# Patient Record
Sex: Female | Born: 1935 | Race: White | Hispanic: No | Smoking: Former smoker
Health system: Southern US, Community
[De-identification: ages and names within clinical notes are randomized; demographics above are authoritative.]

## PROBLEM LIST (undated history)

## (undated) DIAGNOSIS — C50912 Malignant neoplasm of unspecified site of left female breast: Secondary | ICD-10-CM

## (undated) DIAGNOSIS — F32A Depression, unspecified: Secondary | ICD-10-CM

## (undated) DIAGNOSIS — F329 Major depressive disorder, single episode, unspecified: Secondary | ICD-10-CM

## (undated) DIAGNOSIS — I428 Other cardiomyopathies: Secondary | ICD-10-CM

## (undated) DIAGNOSIS — I480 Paroxysmal atrial fibrillation: Secondary | ICD-10-CM

## (undated) DIAGNOSIS — J439 Emphysema, unspecified: Secondary | ICD-10-CM

## (undated) DIAGNOSIS — M8580 Other specified disorders of bone density and structure, unspecified site: Secondary | ICD-10-CM

## (undated) DIAGNOSIS — Z95810 Presence of automatic (implantable) cardiac defibrillator: Secondary | ICD-10-CM

## (undated) DIAGNOSIS — M1990 Unspecified osteoarthritis, unspecified site: Secondary | ICD-10-CM

## (undated) DIAGNOSIS — I509 Heart failure, unspecified: Secondary | ICD-10-CM

## (undated) DIAGNOSIS — I10 Essential (primary) hypertension: Secondary | ICD-10-CM

## (undated) DIAGNOSIS — J189 Pneumonia, unspecified organism: Secondary | ICD-10-CM

## (undated) DIAGNOSIS — I5020 Unspecified systolic (congestive) heart failure: Secondary | ICD-10-CM

## (undated) DIAGNOSIS — F419 Anxiety disorder, unspecified: Secondary | ICD-10-CM

## (undated) DIAGNOSIS — R296 Repeated falls: Secondary | ICD-10-CM

## (undated) DIAGNOSIS — R011 Cardiac murmur, unspecified: Secondary | ICD-10-CM

## (undated) DIAGNOSIS — E785 Hyperlipidemia, unspecified: Secondary | ICD-10-CM

## (undated) HISTORY — DX: Emphysema, unspecified: J43.9

## (undated) HISTORY — DX: Depression, unspecified: F32.A

## (undated) HISTORY — PX: FRACTURE SURGERY: SHX138

## (undated) HISTORY — PX: CATARACT EXTRACTION W/ INTRAOCULAR LENS  IMPLANT, BILATERAL: SHX1307

## (undated) HISTORY — DX: Other specified disorders of bone density and structure, unspecified site: M85.80

## (undated) HISTORY — DX: Essential (primary) hypertension: I10

## (undated) HISTORY — DX: Hyperlipidemia, unspecified: E78.5

## (undated) HISTORY — DX: Other cardiomyopathies: I42.8

## (undated) HISTORY — PX: MASTECTOMY: SHX3

## (undated) HISTORY — PX: BREAST BIOPSY: SHX20

## (undated) HISTORY — DX: Major depressive disorder, single episode, unspecified: F32.9

## (undated) HISTORY — DX: Heart failure, unspecified: I50.9

## (undated) HISTORY — PX: TUBAL LIGATION: SHX77

---
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
## 2002-11-25 ENCOUNTER — Encounter: Admission: RE | Admit: 2002-11-25 | Discharge: 2002-11-25 | Payer: Self-pay | Admitting: Family Medicine

## 2002-11-25 ENCOUNTER — Ambulatory Visit (HOSPITAL_COMMUNITY): Admission: RE | Admit: 2002-11-25 | Discharge: 2002-11-25 | Payer: Self-pay | Admitting: Family Medicine

## 2002-11-27 ENCOUNTER — Encounter (INDEPENDENT_AMBULATORY_CARE_PROVIDER_SITE_OTHER): Payer: Self-pay | Admitting: *Deleted

## 2002-11-27 ENCOUNTER — Encounter (INDEPENDENT_AMBULATORY_CARE_PROVIDER_SITE_OTHER): Payer: Self-pay | Admitting: Radiology

## 2002-11-27 ENCOUNTER — Other Ambulatory Visit: Admission: RE | Admit: 2002-11-27 | Discharge: 2002-11-27 | Payer: Self-pay | Admitting: Radiology

## 2002-11-27 ENCOUNTER — Encounter: Payer: Self-pay | Admitting: Family Medicine

## 2002-11-27 ENCOUNTER — Encounter: Admission: RE | Admit: 2002-11-27 | Discharge: 2002-11-27 | Payer: Self-pay | Admitting: Family Medicine

## 2002-11-28 ENCOUNTER — Encounter: Admission: RE | Admit: 2002-11-28 | Discharge: 2002-11-28 | Payer: Self-pay | Admitting: Sports Medicine

## 2002-11-28 ENCOUNTER — Encounter: Payer: Self-pay | Admitting: Sports Medicine

## 2002-12-03 ENCOUNTER — Ambulatory Visit (HOSPITAL_COMMUNITY): Admission: RE | Admit: 2002-12-03 | Discharge: 2002-12-03 | Payer: Self-pay | Admitting: General Surgery

## 2002-12-03 ENCOUNTER — Encounter: Payer: Self-pay | Admitting: General Surgery

## 2002-12-18 HISTORY — PX: CARDIAC CATHETERIZATION: SHX172

## 2002-12-23 ENCOUNTER — Encounter: Admission: RE | Admit: 2002-12-23 | Discharge: 2002-12-23 | Payer: Self-pay | Admitting: Family Medicine

## 2002-12-30 ENCOUNTER — Inpatient Hospital Stay (HOSPITAL_COMMUNITY): Admission: RE | Admit: 2002-12-30 | Discharge: 2002-12-31 | Payer: Self-pay | Admitting: General Surgery

## 2002-12-30 ENCOUNTER — Encounter (INDEPENDENT_AMBULATORY_CARE_PROVIDER_SITE_OTHER): Payer: Self-pay

## 2003-01-16 ENCOUNTER — Inpatient Hospital Stay (HOSPITAL_COMMUNITY): Admission: RE | Admit: 2003-01-16 | Discharge: 2003-01-20 | Payer: Self-pay | Admitting: General Surgery

## 2003-02-11 ENCOUNTER — Ambulatory Visit (HOSPITAL_COMMUNITY): Admission: RE | Admit: 2003-02-11 | Discharge: 2003-02-11 | Payer: Self-pay | Admitting: Oncology

## 2003-02-11 ENCOUNTER — Encounter: Payer: Self-pay | Admitting: Oncology

## 2003-02-12 ENCOUNTER — Ambulatory Visit (HOSPITAL_COMMUNITY): Admission: RE | Admit: 2003-02-12 | Discharge: 2003-02-12 | Payer: Self-pay | Admitting: General Surgery

## 2003-02-12 ENCOUNTER — Encounter: Payer: Self-pay | Admitting: General Surgery

## 2003-09-09 ENCOUNTER — Ambulatory Visit (HOSPITAL_COMMUNITY): Admission: RE | Admit: 2003-09-09 | Discharge: 2003-09-09 | Payer: Self-pay | Admitting: Oncology

## 2003-09-21 ENCOUNTER — Encounter: Admission: RE | Admit: 2003-09-21 | Discharge: 2003-09-21 | Payer: Self-pay | Admitting: Oncology

## 2003-11-30 ENCOUNTER — Encounter: Admission: RE | Admit: 2003-11-30 | Discharge: 2003-11-30 | Payer: Self-pay | Admitting: Oncology

## 2003-12-15 ENCOUNTER — Encounter: Admission: RE | Admit: 2003-12-15 | Discharge: 2003-12-15 | Payer: Self-pay | Admitting: Sports Medicine

## 2003-12-21 ENCOUNTER — Encounter: Admission: RE | Admit: 2003-12-21 | Discharge: 2003-12-21 | Payer: Self-pay | Admitting: Family Medicine

## 2004-03-09 ENCOUNTER — Ambulatory Visit (HOSPITAL_COMMUNITY): Admission: RE | Admit: 2004-03-09 | Discharge: 2004-03-09 | Payer: Self-pay | Admitting: Oncology

## 2004-04-11 ENCOUNTER — Emergency Department (HOSPITAL_COMMUNITY): Admission: EM | Admit: 2004-04-11 | Discharge: 2004-04-11 | Payer: Self-pay | Admitting: Emergency Medicine

## 2004-04-19 ENCOUNTER — Ambulatory Visit: Payer: Self-pay | Admitting: Family Medicine

## 2004-05-31 ENCOUNTER — Ambulatory Visit: Payer: Self-pay | Admitting: Oncology

## 2004-09-28 ENCOUNTER — Ambulatory Visit: Payer: Self-pay | Admitting: Oncology

## 2004-10-07 ENCOUNTER — Encounter: Admission: RE | Admit: 2004-10-07 | Discharge: 2004-10-07 | Payer: Self-pay | Admitting: *Deleted

## 2004-10-13 ENCOUNTER — Ambulatory Visit (HOSPITAL_COMMUNITY): Admission: RE | Admit: 2004-10-13 | Discharge: 2004-10-14 | Payer: Self-pay | Admitting: *Deleted

## 2004-10-13 HISTORY — PX: CARDIAC DEFIBRILLATOR PLACEMENT: SHX171

## 2004-11-07 ENCOUNTER — Ambulatory Visit (HOSPITAL_COMMUNITY): Admission: RE | Admit: 2004-11-07 | Discharge: 2004-11-07 | Payer: Self-pay | Admitting: Oncology

## 2004-11-09 ENCOUNTER — Encounter: Admission: RE | Admit: 2004-11-09 | Discharge: 2004-11-09 | Payer: Self-pay | Admitting: Oncology

## 2004-11-30 ENCOUNTER — Encounter: Admission: RE | Admit: 2004-11-30 | Discharge: 2004-11-30 | Payer: Self-pay | Admitting: Oncology

## 2005-01-21 ENCOUNTER — Encounter (INDEPENDENT_AMBULATORY_CARE_PROVIDER_SITE_OTHER): Payer: Self-pay | Admitting: *Deleted

## 2005-01-23 ENCOUNTER — Ambulatory Visit: Payer: Self-pay | Admitting: Oncology

## 2005-02-07 ENCOUNTER — Ambulatory Visit: Payer: Self-pay | Admitting: Family Medicine

## 2005-02-07 ENCOUNTER — Encounter: Payer: Self-pay | Admitting: Family Medicine

## 2005-03-21 ENCOUNTER — Ambulatory Visit: Payer: Self-pay | Admitting: Sports Medicine

## 2005-05-19 ENCOUNTER — Ambulatory Visit: Payer: Self-pay | Admitting: Oncology

## 2005-07-08 ENCOUNTER — Emergency Department (HOSPITAL_COMMUNITY): Admission: EM | Admit: 2005-07-08 | Discharge: 2005-07-08 | Payer: Self-pay | Admitting: Emergency Medicine

## 2005-07-18 ENCOUNTER — Emergency Department (HOSPITAL_COMMUNITY): Admission: EM | Admit: 2005-07-18 | Discharge: 2005-07-18 | Payer: Self-pay | Admitting: Family Medicine

## 2005-11-19 ENCOUNTER — Ambulatory Visit: Payer: Self-pay | Admitting: Oncology

## 2005-11-21 ENCOUNTER — Ambulatory Visit (HOSPITAL_COMMUNITY): Admission: RE | Admit: 2005-11-21 | Discharge: 2005-11-21 | Payer: Self-pay | Admitting: Oncology

## 2005-11-21 LAB — CBC WITH DIFFERENTIAL/PLATELET
Basophils Absolute: 0 10*3/uL (ref 0.0–0.1)
EOS%: 1.2 % (ref 0.0–7.0)
Eosinophils Absolute: 0.1 10*3/uL (ref 0.0–0.5)
HGB: 13.5 g/dL (ref 11.6–15.9)
MCH: 32.1 pg (ref 26.0–34.0)
NEUT#: 6.4 10*3/uL (ref 1.5–6.5)
RBC: 4.21 10*6/uL (ref 3.70–5.32)
RDW: 13.3 % (ref 11.3–14.5)
lymph#: 1.3 10*3/uL (ref 0.9–3.3)

## 2005-11-21 LAB — COMPREHENSIVE METABOLIC PANEL
ALT: 17 U/L (ref 0–40)
AST: 32 U/L (ref 0–37)
Alkaline Phosphatase: 65 U/L (ref 39–117)
Chloride: 101 mEq/L (ref 96–112)
Creatinine, Ser: 1 mg/dL (ref 0.4–1.2)
Total Bilirubin: 0.5 mg/dL (ref 0.3–1.2)

## 2005-12-01 ENCOUNTER — Encounter: Admission: RE | Admit: 2005-12-01 | Discharge: 2005-12-01 | Payer: Self-pay | Admitting: Oncology

## 2006-02-21 ENCOUNTER — Ambulatory Visit: Payer: Self-pay | Admitting: Family Medicine

## 2006-03-12 ENCOUNTER — Ambulatory Visit: Payer: Self-pay | Admitting: Family Medicine

## 2006-05-29 ENCOUNTER — Ambulatory Visit: Payer: Self-pay | Admitting: Oncology

## 2006-05-31 LAB — LACTATE DEHYDROGENASE: LDH: 193 U/L (ref 94–250)

## 2006-05-31 LAB — CBC WITH DIFFERENTIAL/PLATELET
Basophils Absolute: 0 10*3/uL (ref 0.0–0.1)
Eosinophils Absolute: 0.3 10*3/uL (ref 0.0–0.5)
HCT: 36.3 % (ref 34.8–46.6)
HGB: 12.6 g/dL (ref 11.6–15.9)
LYMPH%: 28.2 % (ref 14.0–48.0)
MONO#: 0.5 10*3/uL (ref 0.1–0.9)
NEUT#: 3.6 10*3/uL (ref 1.5–6.5)
NEUT%: 59.1 % (ref 39.6–76.8)
Platelets: 201 10*3/uL (ref 145–400)
WBC: 6.1 10*3/uL (ref 3.9–10.0)

## 2006-05-31 LAB — COMPREHENSIVE METABOLIC PANEL
CO2: 27 mEq/L (ref 19–32)
Creatinine, Ser: 1.12 mg/dL (ref 0.40–1.20)
Glucose, Bld: 116 mg/dL — ABNORMAL HIGH (ref 70–99)
Total Bilirubin: 0.4 mg/dL (ref 0.3–1.2)

## 2006-05-31 LAB — CANCER ANTIGEN 27.29: CA 27.29: 25 U/mL (ref 0–39)

## 2006-09-20 DIAGNOSIS — I10 Essential (primary) hypertension: Secondary | ICD-10-CM

## 2006-09-20 DIAGNOSIS — I5032 Chronic diastolic (congestive) heart failure: Secondary | ICD-10-CM

## 2006-09-20 DIAGNOSIS — I472 Ventricular tachycardia: Secondary | ICD-10-CM

## 2006-09-20 DIAGNOSIS — E7800 Pure hypercholesterolemia, unspecified: Secondary | ICD-10-CM

## 2006-09-20 DIAGNOSIS — L408 Other psoriasis: Secondary | ICD-10-CM

## 2006-09-20 DIAGNOSIS — F339 Major depressive disorder, recurrent, unspecified: Secondary | ICD-10-CM | POA: Insufficient documentation

## 2006-09-20 DIAGNOSIS — M810 Age-related osteoporosis without current pathological fracture: Secondary | ICD-10-CM | POA: Insufficient documentation

## 2006-09-20 DIAGNOSIS — F17200 Nicotine dependence, unspecified, uncomplicated: Secondary | ICD-10-CM

## 2006-09-21 ENCOUNTER — Encounter (INDEPENDENT_AMBULATORY_CARE_PROVIDER_SITE_OTHER): Payer: Self-pay | Admitting: *Deleted

## 2006-11-27 ENCOUNTER — Ambulatory Visit: Payer: Self-pay | Admitting: Oncology

## 2006-11-29 LAB — COMPREHENSIVE METABOLIC PANEL
Albumin: 4.4 g/dL (ref 3.5–5.2)
Alkaline Phosphatase: 72 U/L (ref 39–117)
BUN: 16 mg/dL (ref 6–23)
CO2: 23 mEq/L (ref 19–32)
Glucose, Bld: 102 mg/dL — ABNORMAL HIGH (ref 70–99)
Total Bilirubin: 0.3 mg/dL (ref 0.3–1.2)

## 2006-11-29 LAB — CBC WITH DIFFERENTIAL/PLATELET
Basophils Absolute: 0 10*3/uL (ref 0.0–0.1)
Eosinophils Absolute: 0.1 10*3/uL (ref 0.0–0.5)
HCT: 34.4 % — ABNORMAL LOW (ref 34.8–46.6)
HGB: 12.1 g/dL (ref 11.6–15.9)
LYMPH%: 26 % (ref 14.0–48.0)
MCV: 90.2 fL (ref 81.0–101.0)
MONO#: 0.4 10*3/uL (ref 0.1–0.9)
MONO%: 7.4 % (ref 0.0–13.0)
NEUT#: 3.8 10*3/uL (ref 1.5–6.5)
Platelets: 176 10*3/uL (ref 145–400)

## 2006-11-29 LAB — LACTATE DEHYDROGENASE: LDH: 190 U/L (ref 94–250)

## 2006-11-29 LAB — CANCER ANTIGEN 27.29: CA 27.29: 24 U/mL (ref 0–39)

## 2006-12-03 ENCOUNTER — Ambulatory Visit (HOSPITAL_COMMUNITY): Admission: RE | Admit: 2006-12-03 | Discharge: 2006-12-03 | Payer: Self-pay | Admitting: Oncology

## 2006-12-04 ENCOUNTER — Encounter: Admission: RE | Admit: 2006-12-04 | Discharge: 2006-12-04 | Payer: Self-pay | Admitting: Oncology

## 2006-12-06 ENCOUNTER — Encounter: Payer: Self-pay | Admitting: Family Medicine

## 2006-12-18 ENCOUNTER — Encounter: Admission: RE | Admit: 2006-12-18 | Discharge: 2006-12-18 | Payer: Self-pay | Admitting: Oncology

## 2007-01-14 ENCOUNTER — Encounter: Payer: Self-pay | Admitting: Family Medicine

## 2007-01-21 ENCOUNTER — Telehealth: Payer: Self-pay | Admitting: *Deleted

## 2007-01-22 ENCOUNTER — Encounter: Payer: Self-pay | Admitting: Family Medicine

## 2007-05-28 ENCOUNTER — Ambulatory Visit: Payer: Self-pay | Admitting: Oncology

## 2007-05-29 ENCOUNTER — Encounter: Payer: Self-pay | Admitting: Family Medicine

## 2007-05-30 LAB — CBC WITH DIFFERENTIAL/PLATELET
BASO%: 0.5 % (ref 0.0–2.0)
EOS%: 3.4 % (ref 0.0–7.0)
MCH: 32.9 pg (ref 26.0–34.0)
MCHC: 36.1 g/dL — ABNORMAL HIGH (ref 32.0–36.0)
MONO#: 0.4 10*3/uL (ref 0.1–0.9)
RDW: 12.8 % (ref 11.3–14.5)
WBC: 6.7 10*3/uL (ref 3.9–10.0)
lymph#: 1.6 10*3/uL (ref 0.9–3.3)

## 2007-05-30 LAB — COMPREHENSIVE METABOLIC PANEL
ALT: 11 U/L (ref 0–35)
AST: 22 U/L (ref 0–37)
Albumin: 4.1 g/dL (ref 3.5–5.2)
Calcium: 9.3 mg/dL (ref 8.4–10.5)
Chloride: 100 mEq/L (ref 96–112)
Potassium: 4.6 mEq/L (ref 3.5–5.3)
Sodium: 134 mEq/L — ABNORMAL LOW (ref 135–145)
Total Protein: 7 g/dL (ref 6.0–8.3)

## 2007-06-03 ENCOUNTER — Encounter: Payer: Self-pay | Admitting: Family Medicine

## 2007-06-03 LAB — CONVERTED CEMR LAB
MCHC: 35.6 g/dL
RDW: 11.7 %
TSH: 7.55 microintl units/mL

## 2007-06-06 ENCOUNTER — Encounter: Payer: Self-pay | Admitting: *Deleted

## 2007-06-07 ENCOUNTER — Ambulatory Visit: Payer: Self-pay | Admitting: Family Medicine

## 2007-06-07 ENCOUNTER — Telehealth: Payer: Self-pay | Admitting: *Deleted

## 2007-06-12 ENCOUNTER — Encounter (INDEPENDENT_AMBULATORY_CARE_PROVIDER_SITE_OTHER): Payer: Self-pay | Admitting: *Deleted

## 2007-06-12 ENCOUNTER — Ambulatory Visit: Payer: Self-pay | Admitting: Family Medicine

## 2007-06-13 LAB — CONVERTED CEMR LAB
BUN: 12 mg/dL (ref 6–23)
Calcium: 9.4 mg/dL (ref 8.4–10.5)
Creatinine, Ser: 0.92 mg/dL (ref 0.40–1.20)
Glucose, Bld: 95 mg/dL (ref 70–99)
Sodium: 138 meq/L (ref 135–145)
TSH: 9.012 microintl units/mL — ABNORMAL HIGH (ref 0.350–5.50)

## 2007-06-18 ENCOUNTER — Telehealth (INDEPENDENT_AMBULATORY_CARE_PROVIDER_SITE_OTHER): Payer: Self-pay | Admitting: *Deleted

## 2007-07-09 ENCOUNTER — Telehealth: Payer: Self-pay | Admitting: *Deleted

## 2007-07-10 ENCOUNTER — Ambulatory Visit: Payer: Self-pay | Admitting: Family Medicine

## 2007-07-10 DIAGNOSIS — F519 Sleep disorder not due to a substance or known physiological condition, unspecified: Secondary | ICD-10-CM | POA: Insufficient documentation

## 2007-07-29 ENCOUNTER — Ambulatory Visit: Payer: Self-pay | Admitting: Family Medicine

## 2007-08-07 ENCOUNTER — Encounter: Payer: Self-pay | Admitting: Family Medicine

## 2007-08-08 ENCOUNTER — Telehealth: Payer: Self-pay | Admitting: *Deleted

## 2007-08-12 ENCOUNTER — Ambulatory Visit: Payer: Self-pay | Admitting: Sports Medicine

## 2007-09-05 ENCOUNTER — Encounter: Payer: Self-pay | Admitting: *Deleted

## 2007-10-15 ENCOUNTER — Encounter: Payer: Self-pay | Admitting: Family Medicine

## 2007-11-05 ENCOUNTER — Ambulatory Visit: Payer: Self-pay | Admitting: Family Medicine

## 2007-11-05 ENCOUNTER — Telehealth: Payer: Self-pay | Admitting: Family Medicine

## 2007-11-05 ENCOUNTER — Encounter: Payer: Self-pay | Admitting: Family Medicine

## 2007-11-05 DIAGNOSIS — Z853 Personal history of malignant neoplasm of breast: Secondary | ICD-10-CM

## 2007-11-05 LAB — CONVERTED CEMR LAB
ALT: 13 units/L (ref 0–35)
AST: 24 units/L (ref 0–37)
CO2: 23 meq/L (ref 19–32)
Calcium: 9.5 mg/dL (ref 8.4–10.5)
Chloride: 103 meq/L (ref 96–112)
Cholesterol: 224 mg/dL — ABNORMAL HIGH (ref 0–200)
Free T4: 1.33 ng/dL (ref 0.89–1.80)
Platelets: 196 10*3/uL (ref 150–400)
RDW: 13.1 % (ref 11.5–15.5)
Sodium: 141 meq/L (ref 135–145)
T3, Free: 3.2 pg/mL (ref 2.3–4.2)
TSH: 7.07 microintl units/mL — ABNORMAL HIGH (ref 0.350–5.50)
Total Bilirubin: 0.5 mg/dL (ref 0.3–1.2)
Total Protein: 7.3 g/dL (ref 6.0–8.3)
VLDL: 17 mg/dL (ref 0–40)
WBC: 7.5 10*3/uL (ref 4.0–10.5)

## 2007-11-08 ENCOUNTER — Encounter: Payer: Self-pay | Admitting: Family Medicine

## 2007-11-12 ENCOUNTER — Encounter: Payer: Self-pay | Admitting: Family Medicine

## 2007-11-13 ENCOUNTER — Encounter: Payer: Self-pay | Admitting: Family Medicine

## 2007-12-03 ENCOUNTER — Ambulatory Visit: Payer: Self-pay | Admitting: Oncology

## 2007-12-05 ENCOUNTER — Encounter: Admission: RE | Admit: 2007-12-05 | Discharge: 2007-12-05 | Payer: Self-pay | Admitting: Oncology

## 2007-12-09 ENCOUNTER — Ambulatory Visit (HOSPITAL_COMMUNITY): Admission: RE | Admit: 2007-12-09 | Discharge: 2007-12-09 | Payer: Self-pay | Admitting: Oncology

## 2007-12-13 ENCOUNTER — Encounter: Payer: Self-pay | Admitting: Family Medicine

## 2008-01-27 ENCOUNTER — Encounter: Payer: Self-pay | Admitting: Family Medicine

## 2008-01-27 LAB — CONVERTED CEMR LAB
ALT: 12 units/L
AST: 36 units/L
BUN: 16 mg/dL
Calcium: 10 mg/dL
Direct LDL: 147 mg/dL
Glucose, Bld: 100 mg/dL
Hemoglobin: 13.7 g/dL
Lymphocytes Relative: 21.5 %
Lymphs Abs: 1.2 10*3/uL
MCHC: 35.7 g/dL
Monocytes Absolute: 0.4 10*3/uL
Monocytes Relative: 8 %
Neutro Abs: 3.8 10*3/uL
Neutrophils Relative %: 68.6 %
Potassium: 4.6 meq/L
RBC: 4.21 M/uL
T3, Free: 3.8 pg/mL
Total Bilirubin: 0.6 mg/dL
Total CHOL/HDL Ratio: 62
VLDL: 18 mg/dL
WBC: 5.5 10*3/uL

## 2008-01-30 ENCOUNTER — Encounter: Payer: Self-pay | Admitting: Family Medicine

## 2008-05-29 ENCOUNTER — Ambulatory Visit: Payer: Self-pay | Admitting: Oncology

## 2008-06-02 LAB — CBC WITH DIFFERENTIAL/PLATELET
BASO%: 0.3 % (ref 0.0–2.0)
EOS%: 2.2 % (ref 0.0–7.0)
HCT: 35.3 % (ref 34.8–46.6)
LYMPH%: 27.9 % (ref 14.0–48.0)
MCH: 32.9 pg (ref 26.0–34.0)
MCHC: 35.3 g/dL (ref 32.0–36.0)
NEUT%: 62.6 % (ref 39.6–76.8)
Platelets: 165 10*3/uL (ref 145–400)
RBC: 3.78 10*6/uL (ref 3.70–5.32)
lymph#: 1.7 10*3/uL (ref 0.9–3.3)

## 2008-06-03 LAB — COMPREHENSIVE METABOLIC PANEL
ALT: 15 U/L (ref 0–35)
AST: 26 U/L (ref 0–37)
Creatinine, Ser: 1.03 mg/dL (ref 0.40–1.20)
Sodium: 136 mEq/L (ref 135–145)
Total Bilirubin: 0.4 mg/dL (ref 0.3–1.2)
Total Protein: 7.6 g/dL (ref 6.0–8.3)

## 2008-06-09 ENCOUNTER — Encounter: Payer: Self-pay | Admitting: Family Medicine

## 2008-06-29 ENCOUNTER — Encounter: Payer: Self-pay | Admitting: Family Medicine

## 2008-07-29 ENCOUNTER — Encounter: Payer: Self-pay | Admitting: Family Medicine

## 2008-09-15 ENCOUNTER — Ambulatory Visit: Payer: Self-pay | Admitting: Family Medicine

## 2008-10-12 ENCOUNTER — Telehealth: Payer: Self-pay | Admitting: Family Medicine

## 2008-10-12 ENCOUNTER — Ambulatory Visit: Payer: Self-pay | Admitting: Family Medicine

## 2008-10-12 DIAGNOSIS — N8112 Cystocele, lateral: Secondary | ICD-10-CM

## 2008-10-14 ENCOUNTER — Ambulatory Visit: Payer: Self-pay | Admitting: Obstetrics & Gynecology

## 2008-11-17 ENCOUNTER — Ambulatory Visit: Payer: Self-pay | Admitting: Family Medicine

## 2008-12-07 ENCOUNTER — Encounter: Admission: RE | Admit: 2008-12-07 | Discharge: 2008-12-07 | Payer: Self-pay | Admitting: Oncology

## 2008-12-09 ENCOUNTER — Ambulatory Visit (HOSPITAL_COMMUNITY): Admission: RE | Admit: 2008-12-09 | Discharge: 2008-12-09 | Payer: Self-pay | Admitting: Oncology

## 2008-12-17 ENCOUNTER — Ambulatory Visit: Payer: Self-pay | Admitting: Oncology

## 2008-12-22 ENCOUNTER — Encounter: Admission: RE | Admit: 2008-12-22 | Discharge: 2008-12-22 | Payer: Self-pay | Admitting: Oncology

## 2008-12-22 LAB — CBC WITH DIFFERENTIAL/PLATELET
Eosinophils Absolute: 0.2 10*3/uL (ref 0.0–0.5)
HCT: 35.3 % (ref 34.8–46.6)
HGB: 12.3 g/dL (ref 11.6–15.9)
LYMPH%: 28.4 % (ref 14.0–49.7)
MONO#: 0.5 10*3/uL (ref 0.1–0.9)
NEUT#: 4 10*3/uL (ref 1.5–6.5)
Platelets: 199 10*3/uL (ref 145–400)
RBC: 3.84 10*6/uL (ref 3.70–5.45)
WBC: 6.6 10*3/uL (ref 3.9–10.3)

## 2008-12-23 LAB — CANCER ANTIGEN 27.29: CA 27.29: 30 U/mL (ref 0–39)

## 2008-12-23 LAB — COMPREHENSIVE METABOLIC PANEL
Albumin: 4.2 g/dL (ref 3.5–5.2)
CO2: 27 mEq/L (ref 19–32)
Glucose, Bld: 73 mg/dL (ref 70–99)
Potassium: 4.8 mEq/L (ref 3.5–5.3)
Sodium: 133 mEq/L — ABNORMAL LOW (ref 135–145)
Total Bilirubin: 0.4 mg/dL (ref 0.3–1.2)
Total Protein: 7.2 g/dL (ref 6.0–8.3)

## 2008-12-23 LAB — VITAMIN D 25 HYDROXY (VIT D DEFICIENCY, FRACTURES): Vit D, 25-Hydroxy: 59 ng/mL (ref 30–89)

## 2008-12-23 LAB — LACTATE DEHYDROGENASE: LDH: 212 U/L (ref 94–250)

## 2008-12-29 ENCOUNTER — Encounter: Payer: Self-pay | Admitting: Family Medicine

## 2009-02-15 ENCOUNTER — Encounter: Payer: Self-pay | Admitting: Family Medicine

## 2009-06-21 ENCOUNTER — Encounter: Payer: Self-pay | Admitting: Family Medicine

## 2009-06-25 ENCOUNTER — Ambulatory Visit: Payer: Self-pay | Admitting: Oncology

## 2009-06-29 LAB — CBC WITH DIFFERENTIAL/PLATELET
BASO%: 0.1 % (ref 0.0–2.0)
EOS%: 3.3 % (ref 0.0–7.0)
HCT: 34.8 % (ref 34.8–46.6)
LYMPH%: 24.8 % (ref 14.0–49.7)
MCH: 32.6 pg (ref 25.1–34.0)
MCHC: 34.5 g/dL (ref 31.5–36.0)
MCV: 94.2 fL (ref 79.5–101.0)
NEUT%: 65.7 % (ref 38.4–76.8)
Platelets: 221 10*3/uL (ref 145–400)

## 2009-06-30 LAB — LACTATE DEHYDROGENASE: LDH: 180 U/L (ref 94–250)

## 2009-06-30 LAB — COMPREHENSIVE METABOLIC PANEL
ALT: 11 U/L (ref 0–35)
AST: 21 U/L (ref 0–37)
BUN: 18 mg/dL (ref 6–23)
Creatinine, Ser: 1.09 mg/dL (ref 0.40–1.20)
Total Bilirubin: 0.3 mg/dL (ref 0.3–1.2)

## 2009-07-06 ENCOUNTER — Encounter: Payer: Self-pay | Admitting: Family Medicine

## 2009-08-06 ENCOUNTER — Ambulatory Visit: Payer: Self-pay | Admitting: Family Medicine

## 2009-08-26 ENCOUNTER — Ambulatory Visit: Payer: Self-pay | Admitting: Obstetrics & Gynecology

## 2009-09-09 ENCOUNTER — Ambulatory Visit: Payer: Self-pay | Admitting: Obstetrics & Gynecology

## 2009-10-11 ENCOUNTER — Encounter: Payer: Self-pay | Admitting: Family Medicine

## 2009-12-08 ENCOUNTER — Encounter: Admission: RE | Admit: 2009-12-08 | Discharge: 2009-12-08 | Payer: Self-pay | Admitting: Oncology

## 2009-12-28 ENCOUNTER — Ambulatory Visit: Payer: Self-pay | Admitting: Oncology

## 2009-12-30 LAB — CBC WITH DIFFERENTIAL/PLATELET
BASO%: 0.3 % (ref 0.0–2.0)
Eosinophils Absolute: 0.1 10*3/uL (ref 0.0–0.5)
HCT: 33.8 % — ABNORMAL LOW (ref 34.8–46.6)
HGB: 11.8 g/dL (ref 11.6–15.9)
LYMPH%: 28.2 % (ref 14.0–49.7)
MCHC: 35 g/dL (ref 31.5–36.0)
MONO#: 0.5 10*3/uL (ref 0.1–0.9)
NEUT#: 3.8 10*3/uL (ref 1.5–6.5)
NEUT%: 60.7 % (ref 38.4–76.8)
Platelets: 183 10*3/uL (ref 145–400)
WBC: 6.2 10*3/uL (ref 3.9–10.3)
lymph#: 1.7 10*3/uL (ref 0.9–3.3)

## 2009-12-30 LAB — COMPREHENSIVE METABOLIC PANEL
ALT: 10 U/L (ref 0–35)
CO2: 27 mEq/L (ref 19–32)
Calcium: 8.9 mg/dL (ref 8.4–10.5)
Chloride: 98 mEq/L (ref 96–112)
Creatinine, Ser: 1.12 mg/dL (ref 0.40–1.20)
Glucose, Bld: 106 mg/dL — ABNORMAL HIGH (ref 70–99)
Sodium: 134 mEq/L — ABNORMAL LOW (ref 135–145)
Total Bilirubin: 0.4 mg/dL (ref 0.3–1.2)
Total Protein: 6.7 g/dL (ref 6.0–8.3)

## 2009-12-30 LAB — CANCER ANTIGEN 27.29: CA 27.29: 32 U/mL (ref 0–39)

## 2009-12-30 LAB — LACTATE DEHYDROGENASE: LDH: 181 U/L (ref 94–250)

## 2010-01-25 ENCOUNTER — Encounter: Payer: Self-pay | Admitting: Family Medicine

## 2010-04-08 ENCOUNTER — Ambulatory Visit: Payer: Self-pay | Admitting: Family Medicine

## 2010-04-12 ENCOUNTER — Encounter: Payer: Self-pay | Admitting: Family Medicine

## 2010-05-05 ENCOUNTER — Encounter: Payer: Self-pay | Admitting: Family Medicine

## 2010-06-06 ENCOUNTER — Encounter: Payer: Self-pay | Admitting: Family Medicine

## 2010-06-15 ENCOUNTER — Ambulatory Visit: Payer: Self-pay | Admitting: Cardiology

## 2010-06-15 ENCOUNTER — Encounter: Payer: Self-pay | Admitting: Family Medicine

## 2010-06-15 ENCOUNTER — Inpatient Hospital Stay (HOSPITAL_COMMUNITY): Admission: AD | Admit: 2010-06-15 | Discharge: 2010-06-17 | Payer: Self-pay | Admitting: Cardiovascular Disease

## 2010-06-17 ENCOUNTER — Encounter: Payer: Self-pay | Admitting: Internal Medicine

## 2010-06-17 HISTORY — PX: PACEMAKER INSERTION: SHX728

## 2010-06-23 ENCOUNTER — Encounter: Payer: Self-pay | Admitting: Internal Medicine

## 2010-06-30 LAB — CBC WITH DIFFERENTIAL/PLATELET
BASO%: 0.2 % (ref 0.0–2.0)
EOS%: 2.6 % (ref 0.0–7.0)
LYMPH%: 28.2 % (ref 14.0–49.7)
MCHC: 34 g/dL (ref 31.5–36.0)
MCV: 91.1 fL (ref 79.5–101.0)
MONO%: 7.7 % (ref 0.0–14.0)
Platelets: 188 10*3/uL (ref 145–400)
RBC: 3.84 10*6/uL (ref 3.70–5.45)
RDW: 12.8 % (ref 11.2–14.5)

## 2010-07-01 LAB — COMPREHENSIVE METABOLIC PANEL
ALT: 12 U/L (ref 0–35)
AST: 27 U/L (ref 0–37)
Alkaline Phosphatase: 66 U/L (ref 39–117)
Potassium: 4.1 mEq/L (ref 3.5–5.3)
Sodium: 138 mEq/L (ref 135–145)
Total Bilirubin: 0.3 mg/dL (ref 0.3–1.2)
Total Protein: 6.9 g/dL (ref 6.0–8.3)

## 2010-07-01 LAB — CANCER ANTIGEN 27.29: CA 27.29: 38 U/mL (ref 0–39)

## 2010-07-06 ENCOUNTER — Ambulatory Visit: Payer: Self-pay | Admitting: Oncology

## 2010-08-18 ENCOUNTER — Encounter (INDEPENDENT_AMBULATORY_CARE_PROVIDER_SITE_OTHER): Payer: Self-pay | Admitting: *Deleted

## 2010-08-25 NOTE — Consult Note (Signed)
Summary: [facility] Hematology/Oncology  [facility] Hematology/Oncology   Imported By: Clydell Hakim 09/06/2009 11:43:50  _____________________________________________________________________  External Attachment:    Type:   Image     Comment:   External Document

## 2010-08-25 NOTE — Miscellaneous (Signed)
  Clinical Lists Changes  Problems: Removed problem of HEALTH MAINTENANCE EXAM (ICD-V70.0) Removed problem of SCREENING FOR MALIGNANT NEOPLASM, CERVIX (ICD-V76.2) Removed problem of CHEST PAIN (ICD-786.50) Removed problem of CARDIOMYOPATHY, IDIOPATHIC (ICD-425.4) Removed problem of BREAST CANCER (ICD-174.9) Removed problem of LOW BACK PAIN (ICD-724.2) Removed problem of THYROID STIMULATING HORMONE, ABNORMAL (ICD-246.9)

## 2010-08-25 NOTE — Letter (Signed)
Summary: [facility]  [facility]   Imported By: Marylou Mccoy 06/22/2010 12:12:06  _____________________________________________________________________  External Attachment:    Type:   Image     Comment:   External Document

## 2010-08-25 NOTE — Letter (Signed)
Summary: [facility]  [facility]   Imported By: Marylou Mccoy 08/01/2010 16:58:30  _____________________________________________________________________  External Attachment:    Type:   Image     Comment:   External Document

## 2010-08-25 NOTE — Consult Note (Signed)
[facility]   Imported By: De Nurse 05/11/2010 14:15:25  _____________________________________________________________________  External Attachment:    Type:   Image     Comment:   External Document

## 2010-08-25 NOTE — Letter (Signed)
Summary: Generic Letter  [facility]  [address]   [place], [place] [address]   Phone: [phone]  Fax: [phone]    08/18/2010  [address] [place], [place]  [address]  Dear Ms. Mayford Knife,  We are happy to let you know that since you are covered under Medicare you are able to have a FREE visit at the [facility] to discuss your HEALTH. This is a new benefit for Medicare.  There will be no co-payment.  At this visit you will meet with Arlys John an expert in wellness and the health coach at our clinic.  At this visit we will discuss ways to keep you healthy and feeling well.  This visit will not replace your regular doctor visit and we cannot refill medications.     You will need to plan to be here at least one hour to talk about your medical history, your current status, review all of your medications, and discuss your future plans for your health.  This information will be entered into your record for your doctor to have and review.  If you are interested in staying healthy, this type of visit can help.  Please call the office at: [phone], to schedule a "Medicare Wellness Visit".  The day of the visit you should bring in all of your medications, including any vitamins, herbs, over the counter products you take.  Make a list of all the other doctors that you see, so we know who they are. If you have any other health documents please bring them.  We look forward to helping you stay healthy.  Sincerely,   [facility]  [facility]

## 2010-08-25 NOTE — Consult Note (Signed)
Summary: [facility]  [facility]   Imported By: Knox Royalty 02/12/2010 12:54:40  _____________________________________________________________________  External Attachment:    Type:   Image     Comment:   External Document

## 2010-08-25 NOTE — Consult Note (Signed)
[facility]   Imported By: De Nurse 06/29/2010 15:19:35  _____________________________________________________________________  External Attachment:    Type:   Image     Comment:   External Document

## 2010-08-25 NOTE — Assessment & Plan Note (Signed)
Summary: cpe,df   Vital Signs:  Patient profile:   77 year old female Height:      62.75 inches Weight:      123.8 pounds BMI:     22.19 Temp:     97.8 degrees F oral Pulse rate:   71 / minute BP sitting:   131 / 73  (left arm) Cuff size:   regular  Vitals Entered By: Garen Grams LPN (April 08, 2010 10:07 AM) CC: cpe Is Patient Diabetic? No Pain Assessment Patient in pain? yes     Location: back   Primary Care Provider:  Tinnie Gens MD  CC:  cpe.  History of Present Illness: Here for CPE.  Has numerous complaints 1.  Vertigo with recumbency, room spinning. 2.  Falling-with painting and gardening this year.  One was assoc. with reading on defib.  Has another scheduled for this month.  Sees cards in Dec., will go sooner if appears defib. is inciting event for falling. 3.  Would like shingles shot--has already recieved. 4.  Would like to try pessary again, even without estrogen-discussed breakdown of vagina.   Habits & Providers  Alcohol-Tobacco-Diet     Tobacco Status: quit     Cigarette Packs/Day: occasional     Year Quit: 2 years  Current Problems (verified): 1)  Cystocele Without Mention Uterine Prolapse Lat  (ICD-618.02) 2)  Low Back Pain  (ICD-724.2) 3)  Health Maintenance Exam  (ICD-V70.0) 4)  Screening For Malignant Neoplasm, Cervix  (ICD-V76.2) 5)  Breast Cancer, Hx of  (ICD-V10.3) 6)  Insomnia-sleep Disorder-unspec  (ICD-307.40) 7)  Thyroid Stimulating Hormone, Abnormal  (ICD-246.9) 8)  Ventricular Tachycardia  (ICD-427.1) 9)  Tobacco Dependence  (ICD-305.1) 10)  Psoriasis  (ICD-696.1) 11)  Osteoporosis, Unspecified  (ICD-733.00) 12)  Hypertension, Benign Systemic  (ICD-401.1) 13)  Hypercholesterolemia  (ICD-272.0) 14)  Depression, Major, Recurrent  (ICD-296.30) 15)  Chf, Ejection Fraction > or = 50%  (ICD-428.32) 16)  Chest Pain  (ICD-786.50) 17)  Cardiomyopathy, Idiopathic  (ICD-425.4) 18)  Breast Cancer  (ICD-174.9)  Current Medications  (verified): 1)  Enalapril Maleate 2.5 Mg  Tabs (Enalapril Maleate) .Marland Kitchen.. 1 By Mouth Bid 2)  Coreg Cr 80 Mg  Cp24 (Carvedilol Phosphate) .Marland Kitchen.. 1 By Mouth Qday 3)  Furosemide 20 Mg Tabs (Furosemide) .... Take 1 Tab By Mouth Every Day 4)  Arimidex 1 Mg  Tabs (Anastrozole) .Marland Kitchen.. 1 By Mouth Once Daily 5)  Aspir-Low 81 Mg Tbec (Aspirin) .Marland Kitchen.. 1 Once Daily After Meal 6)  Caltrate 600+d 600-400 Mg-Unit  Tabs (Calcium Carbonate-Vitamin D) .... Take 1 Tab By Mouth Twice A Day 7)  Proair Hfa 108 (90 Base) Mcg/act Aers (Albuterol Sulfate) .... 2 Puffs Four Times Daily As Needed 8)  Desowen Cream W/cetaphil Lot 0.05 %  Kit (Desonide Crea-Moisturizing Lot) .... Apply Sparingly To Affected Area Two Times A Day As Needed 9)  Meclizine Hcl 12.5 Mg Tabs (Meclizine Hcl) .Marland Kitchen.. 1 By Mouth Two Times A Day As Needed  Allergies (verified): No Known Drug Allergies  Past History:  Past Medical History: Last updated: 11/05/2007 Arimidex 1 po q d, dovonex bid, Pt. Has living will Breast cancer, hx of  Past Surgical History: Last updated: 09/20/2006 Appendectomy - 07/24/1941, Implantable defibrillator - 02/07/2005, Mastectomy - 12/23/2002, Tonsillectomy - 07/24/1938  Family History: Last updated: 09/20/2006 Brother living age 78-CAD, Father deceased age 61-CAD, Identical Twin living-high cholesterol, Mother-deceased age 23-CAD, sister living 14 - back problems  Social History: Last updated: 09/20/2006 2 oz. EtoH/day; 1/2-3/4 ppd  smoker since age 77, quit for 3 years (occasional cigarette use reported); Widowed; 2 children, 2 grandchildren-[place], [place] (no one local)  Risk Factors: Smoking Status: quit (04/08/2010) Packs/Day: occasional (04/08/2010)  Review of Systems  The patient denies anorexia, weight loss, weight gain, chest pain, dyspnea on exertion, peripheral edema, prolonged cough, headaches, hemoptysis, abdominal pain, melena, hematochezia, and severe indigestion/heartburn.    Physical  Exam  General:  alert, well-developed, and well-nourished.   Head:  normocephalic and atraumatic.   Neck:  supple.   Lungs:  normal respiratory effort, no intercostal retractions, and normal breath sounds.   Heart:  normal rate, regular rhythm, and no murmur.   Abdomen:  soft, non-tender, and normal bowel sounds.     Impression & Recommendations:  Problem # 1:  CYSTOCELE WITHOUT MENTION UTERINE PROLAPSE LAT (ICD-618.02)  Problem # 2:  HYPERTENSION, BENIGN SYSTEMIC (ICD-401.1)  Her updated medication list for this problem includes:    Enalapril Maleate 2.5 Mg Tabs (Enalapril maleate) .Marland Kitchen... 1 by mouth bid    Coreg Cr 80 Mg Cp24 (Carvedilol phosphate) .Marland Kitchen... 1 by mouth qday    Furosemide 20 Mg Tabs (Furosemide) .Marland Kitchen... Take 1 tab by mouth every day  Problem # 3:  TOBACCO DEPENDENCE (ICD-305.1)  Problem # 4:  BREAST CANCER (ICD-174.9) Follows with breast center for mammograms.  Complete Medication List: 1)  Enalapril Maleate 2.5 Mg Tabs (Enalapril maleate) .Marland Kitchen.. 1 by mouth bid 2)  Coreg Cr 80 Mg Cp24 (Carvedilol phosphate) .Marland Kitchen.. 1 by mouth qday 3)  Furosemide 20 Mg Tabs (Furosemide) .... Take 1 tab by mouth every day 4)  Arimidex 1 Mg Tabs (Anastrozole) .Marland Kitchen.. 1 by mouth once daily 5)  Aspir-low 81 Mg Tbec (Aspirin) .Marland Kitchen.. 1 once daily after meal 6)  Caltrate 600+d 600-400 Mg-unit Tabs (Calcium carbonate-vitamin d) .... Take 1 tab by mouth twice a day 7)  Proair Hfa 108 (90 Base) Mcg/act Aers (Albuterol sulfate) .... 2 puffs four times daily as needed 8)  Desowen Cream W/cetaphil Lot 0.05 % Kit (Desonide crea-moisturizing lot) .... Apply sparingly to affected area two times a day as needed 9)  Meclizine Hcl 12.5 Mg Tabs (Meclizine hcl) .Marland Kitchen.. 1 by mouth two times a day as needed  Other Orders: [facility]- Est Level  3 (86578) Pneumococcal Vaccine (46962) Admin 1st Vaccine (95284) Prescriptions: MECLIZINE HCL 12.5 MG TABS (MECLIZINE HCL) 1 by mouth two times a day as needed  #60 x 3   Entered and  Authorized by:   Tinnie Gens MD   Signed by:   Tinnie Gens MD on 04/08/2010   Method used:   Electronically to        [facility]  [address] [phone]* (retail)       [address]       [place], [place]  [id number]       Ph: [phone] or [phone]       Fax: [phone]   RxID:   [id number]    Immunizations Administered:  Pneumonia Vaccine:    Vaccine Type: Pneumovax (Medicare)    Site: right deltoid    Mfr: Merck    Dose: 0.5 ml    Route: IM    Given by: Jone Baseman CMA    Exp. Date: 08/10/2011    Lot #: [id number]    VIS given: 06/28/09 version given April 08, 2010.

## 2010-08-25 NOTE — Assessment & Plan Note (Signed)
Summary: fallen bladder,df   Vital Signs:  Patient profile:   77 year old female Height:      62.75 inches Weight:      127 pounds BMI:     22.76 BSA:     1.59 Temp:     98.6 degrees F Pulse rate:   65 / minute BP sitting:   152 / 76  Vitals Entered By: Jone Baseman CMA (August 06, 2009 9:59 AM) CC: f/u bladder, pelvic organ prolapse Is Patient Diabetic? No Pain Assessment Patient in pain? no        Primary Care Provider:  Tinnie Gens MD  CC:  f/u bladder and pelvic organ prolapse.  History of Present Illness:       This is a 77 year old female who presents with pelvic organ prolapse.  The patient presents with vaginal pressure, vaginal bulge, and urgency, but has no history of vaginal bleeding, dysuria, UTI, frequency, UUI, SUI, prior pelvic/vaginal surgery, and prior prolapse surgery.  Patient reports a history of being postmenopausal.  Prior evaluation has included no diagnostic testing.  Prior treatment has included pelvic floor training.  Prior treatment is reported as ineffective.    Habits & Providers  Alcohol-Tobacco-Diet     Tobacco Status: quit     Year Quit: 2 years  Current Problems (verified): 1)  Cystocele Without Mention Uterine Prolapse Lat  (ICD-618.02) 2)  Low Back Pain  (ICD-724.2) 3)  Health Maintenance Exam  (ICD-V70.0) 4)  Screening For Malignant Neoplasm, Cervix  (ICD-V76.2) 5)  Breast Cancer, Hx of  (ICD-V10.3) 6)  Insomnia-sleep Disorder-unspec  (ICD-307.40) 7)  Thyroid Stimulating Hormone, Abnormal  (ICD-246.9) 8)  Ventricular Tachycardia  (ICD-427.1) 9)  Tobacco Dependence  (ICD-305.1) 10)  Psoriasis  (ICD-696.1) 11)  Osteoporosis, Unspecified  (ICD-733.00) 12)  Hypertension, Benign Systemic  (ICD-401.1) 13)  Hypercholesterolemia  (ICD-272.0) 14)  Depression, Major, Recurrent  (ICD-296.30) 15)  Chf, Ejection Fraction > or = 50%  (ICD-428.32) 16)  Chest Pain  (ICD-786.50) 17)  Cardiomyopathy, Idiopathic  (ICD-425.4) 18)  Breast  Cancer  (ICD-174.9)  Current Medications (verified): 1)  Enalapril Maleate 2.5 Mg  Tabs (Enalapril Maleate) .Marland Kitchen.. 1 By Mouth Bid 2)  Coreg Cr 80 Mg  Cp24 (Carvedilol Phosphate) .Marland Kitchen.. 1 By Mouth Qday 3)  Furosemide 20 Mg Tabs (Furosemide) .... Take 1 Tab By Mouth Every Day 4)  Arimidex 1 Mg  Tabs (Anastrozole) .Marland Kitchen.. 1 By Mouth Once Daily 5)  Aspir-Low 81 Mg Tbec (Aspirin) .Marland Kitchen.. 1 Once Daily After Meal 6)  Caltrate 600+d 600-400 Mg-Unit  Tabs (Calcium Carbonate-Vitamin D) .... Take 1 Tab By Mouth Twice A Day 7)  Proair Hfa 108 (90 Base) Mcg/act Aers (Albuterol Sulfate) .... 2 Puffs Four Times Daily As Needed 8)  Desowen Cream W/cetaphil Lot 0.05 %  Kit (Desonide Crea-Moisturizing Lot) .... Apply Sparingly To Affected Area Two Times A Day As Needed  Allergies (verified): No Known Drug Allergies  Past History:  Past Medical History: Last updated: 11/05/2007 Arimidex 1 po q d, dovonex bid, Pt. Has living will Breast cancer, hx of  Past Surgical History: Last updated: 09/20/2006 Appendectomy - 07/24/1941, Implantable defibrillator - 02/07/2005, Mastectomy - 12/23/2002, Tonsillectomy - 07/24/1938  Family History: Last updated: 09/20/2006 Brother living age 45-CAD, Father deceased age 53-CAD, Identical Twin living-high cholesterol, Mother-deceased age 36-CAD, sister living 42 - back problems  Social History: Last updated: 09/20/2006 2 oz. EtoH/day; 1/2-3/4 ppd smoker since age 34, quit for 3 years (occasional cigarette use reported); Widowed; 2  children, 2 grandchildren-[place], [place] (no one local)  Risk Factors: Smoking Status: quit (08/06/2009) Packs/Day: occasional (11/17/2008)  Social History: Smoking Status:  quit  Review of Systems  The patient denies anorexia, fever, weight loss, chest pain, syncope, dyspnea on exertion, prolonged cough, headaches, abdominal pain, hematochezia, and severe indigestion/heartburn.    Physical Exam  General:  alert, well-developed, and  well-nourished.   Head:  normocephalic and atraumatic.   Neck:  supple.   Lungs:  normal respiratory effort.   Heart:  normal rate.   Abdomen:  soft and non-tender.     Impression & Recommendations:  Problem # 1:  CYSTOCELE WITHOUT MENTION UTERINE PROLAPSE LAT (ICD-618.02)  Desires treatment now.  Options reviewed with pt.  Will refer for pessary placement at [facility] office. Appt. made for 08/26/2009.  Orders: [facility]- Est Level  2 ([address])  Complete Medication List: 1)  Enalapril Maleate 2.5 Mg Tabs (Enalapril maleate) .Marland Kitchen.. 1 by mouth bid 2)  Coreg Cr 80 Mg Cp24 (Carvedilol phosphate) .Marland Kitchen.. 1 by mouth qday 3)  Furosemide 20 Mg Tabs (Furosemide) .... Take 1 tab by mouth every day 4)  Arimidex 1 Mg Tabs (Anastrozole) .Marland Kitchen.. 1 by mouth once daily 5)  Aspir-low 81 Mg Tbec (Aspirin) .Marland Kitchen.. 1 once daily after meal 6)  Caltrate 600+d 600-400 Mg-unit Tabs (Calcium carbonate-vitamin d) .... Take 1 tab by mouth twice a day 7)  Proair Hfa 108 (90 Base) Mcg/act Aers (Albuterol sulfate) .... 2 puffs four times daily as needed 8)  Desowen Cream W/cetaphil Lot 0.05 % Kit (Desonide crea-moisturizing lot) .... Apply sparingly to affected area two times a day as needed

## 2010-08-25 NOTE — Consult Note (Signed)
Summary: [facility]   Imported By: Knox Royalty 07/01/2010 16:40:02  _____________________________________________________________________  External Attachment:    Type:   Image     Comment:   External Document

## 2010-08-25 NOTE — Miscellaneous (Signed)
Summary: Chaning CHF to Diastolic CHF  Clinical Lists Changes  Problems: Changed problem from CHF, EJECTION FRACTION > OR = 50% (ICD-428.32) to CHRONIC DIASTOLIC HEART FAILURE (ICD-428.32) Observations: Added new observation of PRIMARY MD: Tinnie Gens MD (04/12/2010 11:26)

## 2010-08-25 NOTE — Consult Note (Signed)
Summary: [facility]  [facility]   Imported By: Clydell Hakim 10/14/2009 12:12:10  _____________________________________________________________________  External Attachment:    Type:   Image     Comment:   External Document

## 2010-09-28 ENCOUNTER — Encounter: Payer: Self-pay | Admitting: Family Medicine

## 2010-09-28 ENCOUNTER — Ambulatory Visit (INDEPENDENT_AMBULATORY_CARE_PROVIDER_SITE_OTHER): Payer: Medicare Other | Admitting: Family Medicine

## 2010-09-28 DIAGNOSIS — J45909 Unspecified asthma, uncomplicated: Secondary | ICD-10-CM | POA: Insufficient documentation

## 2010-09-28 DIAGNOSIS — N8112 Cystocele, lateral: Secondary | ICD-10-CM

## 2010-09-28 DIAGNOSIS — R319 Hematuria, unspecified: Secondary | ICD-10-CM

## 2010-09-28 DIAGNOSIS — L719 Rosacea, unspecified: Secondary | ICD-10-CM

## 2010-09-28 LAB — POCT UA - MICROSCOPIC ONLY

## 2010-09-28 LAB — POCT URINALYSIS DIPSTICK
Bilirubin, UA: NEGATIVE
Glucose, UA: NEGATIVE
Ketones, UA: NEGATIVE
Spec Grav, UA: 1.01
Urobilinogen, UA: 0.2

## 2010-09-28 MED ORDER — CEPHALEXIN 500 MG PO CAPS: 500.0000 mg | ORAL_CAPSULE | Freq: Three times a day (TID) | ORAL | Status: AC

## 2010-09-28 MED ORDER — FLUTICASONE-SALMETEROL 100-50 MCG/DOSE IN AEPB: 1.0000 | INHALATION_SPRAY | Freq: Two times a day (BID) | RESPIRATORY_TRACT | Status: DC

## 2010-09-28 MED ORDER — DESONIDE 0.05 % EX CREA: TOPICAL_CREAM | Freq: Two times a day (BID) | CUTANEOUS | Status: DC

## 2010-09-28 NOTE — Assessment & Plan Note (Signed)
Flushed her pessary-will arrange new one.

## 2010-09-28 NOTE — Assessment & Plan Note (Signed)
trial of LABA + ICS.

## 2010-09-28 NOTE — Progress Notes (Signed)
  Subjective:    Patient ID: Brianna [name], female    DOB: 02/08/1930, 77 y.o.   MRN: [id number]  HPI Comments: Pt. Reports that she is increasingly short breath and albuterol is no longer helping.  Hematuria This is a new problem. The current episode started in the past 7 days. The problem has been gradually worsening since onset. She describes the hematuria as gross hematuria. She reports no clotting in her urine stream. She is experiencing no pain. She describes her urine color as dark red. Irritative symptoms do not include frequency, nocturia or urgency. Obstructive symptoms do not include dribbling or straining. Pertinent negatives include no abdominal pain, chills, dysuria, fever, flank pain or inability to urinate.      Review of Systems  Constitutional: Negative for fever and chills.  Gastrointestinal: Negative for abdominal pain.  Genitourinary: Positive for hematuria. Negative for dysuria, urgency, frequency, flank pain, vaginal bleeding, pelvic pain and nocturia.  Musculoskeletal: Positive for back pain.       Objective:   Physical Exam  Constitutional: She appears well-developed and well-nourished.  HENT:  Head: Normocephalic and atraumatic.  Cardiovascular: Normal rate and normal heart sounds.   Pulmonary/Chest: Breath sounds normal.  Abdominal: Soft. She exhibits no mass. There is no tenderness. There is no rebound and no guarding.          Assessment & Plan:

## 2010-09-28 NOTE — Patient Instructions (Addendum)
Place urinary tract infection patient instructions here. Urinary Tract Infection (UTI) Infections of the urinary tract can start in several places. A bladder infection (cystitis), a kidney infection (pyelonephritis), and a prostate infection (prostatitis) are different types of urinary tract infections. They usually get better if treated with medicines (antibiotics) that kill germs. Take all the medicine until it is gone. You or your child may feel better in a few days, but TAKE ALL MEDICINE or the infection may not respond and may become more difficult to treat. HOME CARE INSTRUCTIONS  Drink enough water and fluids to keep the urine clear or pale yellow. Cranberry juice is especially recommended, in addition to large amounts of water.   Avoid caffeine, tea, and carbonated beverages. They tend to irritate the bladder.   Alcohol may irritate the prostate.   Only take over-the-counter or prescription medicines for pain, discomfort, or fever as directed by your caregiver.  FINDING OUT THE RESULTS OF YOUR TEST Not all test results are available during your visit. If your or your child's test results are not back during the visit, make an appointment with your caregiver to find out the results. Do not assume everything is normal if you have not heard from your caregiver or the medical facility. It is important for you to follow up on all test results. TO PREVENT FURTHER INFECTIONS:  Empty the bladder often. Avoid holding urine for long periods of time.   After a bowel movement, women should cleanse from front to back. Use each tissue only once.   Empty the bladder before and after sexual intercourse.  SEEK MEDICAL CARE IF:  There is back pain.   You or your child has an oral temperature above 101.   Your baby is older than 3 months with a rectal temperature of 100.5 F (38.1 C) or higher for more than 1 day.   Your or your child's problems (symptoms) are no better in 3 days. Return sooner  if you or your child is getting worse.  SEEK IMMEDIATE MEDICAL CARE IF:  There is severe back pain or lower abdominal pain.   You or your child develops chills.   You or your child has an oral temperature above 101, not controlled by medicine.   Your baby is older than 3 months with a rectal temperature of 102 F (38.9 C) or higher.   Your baby is 7 months old or younger with a rectal temperature of 100.4 F (38 C) or higher.   There is nausea or vomiting.   There is continued burning or discomfort with urination.  MAKE SURE YOU:  Understand these instructions.   Will watch this condition.   Will get help right away if you or your child is not doing well or gets worse.  Document Released: 04/19/2005 Document Re-Released: 10/04/2009 [facility] Patient Information 2011 [place], [place].Hematuria (Blood in the Urine), Adult Hematuria (blood in your urine) can be caused by a bladder infection (cystitis), kidney infection (pyelonephritis), prostate infection (prostatitis), or kidney stone. Infections will usually respond to antibiotics (medications which kill germs), and a kidney stone will usually pass through your urine without further treatment. If you were put on antibiotics, take all the medicine until gone. You may feel better in a few days, but TAKE ALL MEDICINE or the infection may not respond and become more difficult to treat. If antibiotics were not given, an infection did not cause the blood in the urine. A further work up to find out the reason may  be needed. HOME CARE INSTRUCTIONS  Drink lots of fluid, 3 to 4 quarts a day. If you have been diagnosed with an infection, cranberry juice is especially recommended, in addition to large amounts of water.   Avoid caffeine, tea, and carbonated beverages, because they tend to irritate the bladder.   Avoid alcohol as it may irritate the prostate.   Only take over-the-counter or prescription medicines for pain, discomfort, or fever as  directed by your caregiver.   If you have been diagnosed with a kidney stone follow your caregivers instructions regarding straining your urine to catch the stone.  TO PREVENT FURTHER INFECTIONS:  Empty the bladder often. Avoid holding urine for long periods of time.   After a bowel movement, women should cleanse front to back. Use each tissue only once.   Empty the bladder before and after sexual intercourse if you are a female.   Return to your caregiver if you develop back pain, fever, nausea (feeling sick to your stomach), vomiting, or your symptoms (problems) are not better in 3 days. Return sooner if you are getting worse.  If you have been requested to return for further testing make sure to keep your appointments. If an infection is not the cause of blood in your urine, X-rays may be required. Your caregiver will discuss this with you. SEEK IMMEDIATE MEDICAL CARE IF:  You have a persistent fever over 102 F (38.9 C).   You develop severe vomiting and are unable to keep the medication down.   You develop severe back or abdominal pain despite taking your medications.   You begin passing a large amount of blood or clots in your urine.   You feel extremely weak or faint, or pass out.  MAKE SURE YOU:   Understand these instructions.   Will watch your condition.   Will get help right away if you are not doing well or get worse.  Document Released: 07/10/2005 Document Re-Released: 10/06/2008 [facility] Patient Information 2011 [place], [place].Asthma, Adult Asthma is caused by narrowing of the air passages in the lungs. It may be triggered by pollen, dust, animal dander, molds, some foods, respiratory infections, exposure to smoke, exercise, emotional stress or other allergens (things that cause allergic reactions or allergies). Repeat attacks are common. HOME CARE INSTRUCTIONS  Use prescription medications as ordered by your caregiver.   Avoid pollen, dust, animal dander, molds,  smoke and other things that cause attacks at home and at work.   You may have fewer attacks if you decrease dust in your home. Electrostatic air cleaners may help.   It may help to replace your pillows or mattress with materials less likely to cause allergies.   Talk to your caregiver about an action plan for managing asthma attacks at home, including, the use of a peak flow meter which measures the severity of your asthma attack. An action plan can help minimize or stop the attack without having to seek medical care.   If you are not on a fluid restriction, drink 8 to 10 glasses of water each day.   Always have a plan prepared for seeking medical attention, including, calling your physician, accessing local emergency care, and calling 911 (in the [place] for a severe attack.   Discuss possible exercise routines with your caregiver.   If animal dander is the cause of asthma, you may need to get rid of pets.  SEEK MEDICAL CARE IF:  You have wheezing and shortness of breath even if taking medicine to  prevent attacks.   An oral temperature above 101 develops.   You have muscle aches, chest pain or thickening of sputum.   Your sputum changes from clear or white to yellow, green, gray or bloody.   You have any problems that may be related to the medicine you are taking (such as a rash, itching, swelling or trouble breathing).  SEEK IMMEDIATE MEDICAL CARE IF:  Your usual medicines do not stop your wheezing or there is increased coughing and/or shortness of breath.   You have increased difficulty breathing.   You have an oral temperature above 101, not controlled by medicine.  MAKE SURE YOU:  Understand these instructions.   Will watch your condition.   Will get help right away if you are not doing well or get worse.  Document Released: 07/10/2005 Document Re-Released: 08/01/2009 [facility] Patient Information 2011 [place], [place].

## 2010-09-30 ENCOUNTER — Encounter: Payer: Self-pay | Admitting: Family Medicine

## 2010-09-30 LAB — URINE CULTURE: Colony Count: NO GROWTH

## 2010-10-04 LAB — URINALYSIS, ROUTINE W REFLEX MICROSCOPIC
Glucose, UA: NEGATIVE mg/dL
Ketones, ur: NEGATIVE mg/dL
Protein, ur: NEGATIVE mg/dL
Urobilinogen, UA: 0.2 mg/dL (ref 0.0–1.0)

## 2010-10-04 LAB — URINE MICROSCOPIC-ADD ON

## 2010-10-04 LAB — COMPREHENSIVE METABOLIC PANEL
BUN: 15 mg/dL (ref 6–23)
Calcium: 8.6 mg/dL (ref 8.4–10.5)
Glucose, Bld: 140 mg/dL — ABNORMAL HIGH (ref 70–99)
Sodium: 133 mEq/L — ABNORMAL LOW (ref 135–145)
Total Protein: 6.5 g/dL (ref 6.0–8.3)

## 2010-10-04 LAB — CBC
Hemoglobin: 11.1 g/dL — ABNORMAL LOW (ref 12.0–15.0)
MCH: 30.4 pg (ref 26.0–34.0)
MCHC: 32.6 g/dL (ref 30.0–36.0)

## 2010-10-04 LAB — PROTIME-INR: Prothrombin Time: 13.8 seconds (ref 11.6–15.2)

## 2010-10-05 ENCOUNTER — Other Ambulatory Visit (INDEPENDENT_AMBULATORY_CARE_PROVIDER_SITE_OTHER): Payer: Medicare Other

## 2010-10-05 DIAGNOSIS — N8112 Cystocele, lateral: Secondary | ICD-10-CM

## 2010-10-05 LAB — POCT UA - MICROSCOPIC ONLY

## 2010-10-05 LAB — POCT URINALYSIS DIPSTICK
Blood, UA: NEGATIVE
Urobilinogen, UA: 0.2
pH, UA: 7

## 2010-10-14 ENCOUNTER — Encounter: Payer: Self-pay | Admitting: Family Medicine

## 2010-10-14 ENCOUNTER — Ambulatory Visit (INDEPENDENT_AMBULATORY_CARE_PROVIDER_SITE_OTHER): Payer: Medicare Other | Admitting: Family Medicine

## 2010-10-14 DIAGNOSIS — R319 Hematuria, unspecified: Secondary | ICD-10-CM

## 2010-10-14 DIAGNOSIS — N8112 Cystocele, lateral: Secondary | ICD-10-CM

## 2010-10-14 LAB — POCT URINALYSIS DIPSTICK
Blood, UA: NEGATIVE
Protein, UA: NEGATIVE
Spec Grav, UA: 1.01
Urobilinogen, UA: 0.2
pH, UA: 7

## 2010-10-14 NOTE — Progress Notes (Signed)
  Subjective:    Patient ID: Brianna [name], female    DOB: 1929-10-26, 77 y.o.   MRN: [id number]  HPI Comments: Has taken course of anti-biotics, but urine culture is negative.  Will repeat urine today.  Last U/A after starting abx was negative for blood.  Hematuria The current episode started 1 to 4 weeks ago. The problem has been resolved since onset. She is experiencing no pain. She describes her urine color as clear. Irritative symptoms do not include frequency, nocturia or urgency. Obstructive symptoms do not include dribbling. Pertinent negatives include no abdominal pain, chills, dysuria, facial swelling, fever, flank pain, nausea or vomiting. She is not sexually active.      Review of Systems  Constitutional: Negative for fever and chills.  HENT: Negative for facial swelling.   Respiratory: Negative for chest tightness and wheezing.   Gastrointestinal: Negative for nausea, vomiting and abdominal pain.  Genitourinary: Positive for hematuria. Negative for dysuria, urgency, frequency, flank pain and nocturia.  Musculoskeletal: Negative for back pain.       Objective:   Physical Exam  Constitutional: She appears well-developed and well-nourished.  HENT:  Head: Normocephalic and atraumatic.  Eyes: No scleral icterus.  Cardiovascular: Normal rate.   Pulmonary/Chest: Effort normal.  Abdominal: Soft.  Neurological: She is alert.  Skin: Skin is warm. There is erythema.             Assessment & Plan:

## 2010-10-14 NOTE — Assessment & Plan Note (Signed)
Have called [facility], they will order pessary and have for her next week.

## 2010-10-24 DIAGNOSIS — N8111 Cystocele, midline: Secondary | ICD-10-CM

## 2010-10-24 DIAGNOSIS — N814 Uterovaginal prolapse, unspecified: Secondary | ICD-10-CM

## 2010-10-26 ENCOUNTER — Ambulatory Visit (INDEPENDENT_AMBULATORY_CARE_PROVIDER_SITE_OTHER): Payer: Medicare Other | Admitting: Family Medicine

## 2010-10-26 ENCOUNTER — Encounter: Payer: Self-pay | Admitting: Family Medicine

## 2010-10-26 DIAGNOSIS — R319 Hematuria, unspecified: Secondary | ICD-10-CM

## 2010-10-26 LAB — POCT URINALYSIS DIPSTICK
Ketones, UA: NEGATIVE
Protein, UA: NEGATIVE
Spec Grav, UA: 1.01
pH, UA: 7

## 2010-10-26 LAB — POCT UA - MICROSCOPIC ONLY

## 2010-10-26 NOTE — Patient Instructions (Signed)
Hematuria (Blood in the Urine), Adult     Hematuria (blood in your urine) can be caused by a bladder infection (cystitis), kidney infection (pyelonephritis), prostate infection (prostatitis), or kidney stone. Infections will usually respond to antibiotics (medications which kill germs), and a kidney stone will usually pass through your urine without further treatment. If you were put on antibiotics, take all the medicine until gone. You may feel better in a few days, but TAKE ALL MEDICINE or the infection may not respond and become more difficult to treat. If antibiotics were not given, an infection did not cause the blood in the urine. A further work up to find out the reason may be needed.     HOME CARE INSTRUCTIONS   Drink lots of fluid, 3 to 4 quarts a day. If you have been diagnosed with an infection, cranberry juice is especially recommended, in addition to large amounts of water.   Avoid caffeine, tea, and carbonated beverages, because they tend to irritate the bladder.   Avoid alcohol as it may irritate the prostate.   Only take over-the-counter or prescription medicines for pain, discomfort, or fever as directed by your caregiver.    If you have been diagnosed with a kidney stone follow your caregivers instructions regarding straining your urine to catch the stone.       TO PREVENT FURTHER INFECTIONS:   Empty the bladder often. Avoid holding urine for long periods of time.   After a bowel movement, women should cleanse front to back. Use each tissue only once.   Empty the bladder before and after sexual intercourse if you are a female.   Return to your caregiver if you develop back pain, fever, nausea (feeling sick to your stomach), vomiting, or your symptoms (problems) are not better in 3 days. Return sooner if you are getting worse.     If you have been requested to return for further testing make sure to keep your appointments. If an infection is not the cause of blood in your urine, X-rays may  be required. Your caregiver will discuss this with you.     SEEK IMMEDIATE MEDICAL CARE IF:   You have a persistent fever over 102 F (38.9 C).   You develop severe vomiting and are unable to keep the medication down.   You develop severe back or abdominal pain despite taking your medications.   You begin passing a large amount of blood or clots in your urine.   You feel extremely weak or faint, or pass out.     MAKE SURE YOU:    Understand these instructions.    Will watch your condition.   Will get help right away if you are not doing well or get worse.     Document Released: 07/10/2005  Document Re-Released: 10/06/2008  [employer] Patient Information 2011 [employer].

## 2010-10-26 NOTE — Progress Notes (Signed)
Addended by: [place], Creedence Heiss on: 10/26/2010 04:12 PM   Modules accepted: Orders

## 2010-10-26 NOTE — Progress Notes (Signed)
  Subjective:    Patient ID: Brianna [name], female    DOB: 13-Sep-1929, 77 y.o.   MRN: [id number]  Hematuria This is a new problem. The current episode started 1 to 4 weeks ago. The problem has been resolved since onset. She describes the hematuria as gross hematuria. Her pain is at a severity of 0/10. She describes her urine color as clear. Irritative symptoms do not include frequency. Pertinent negatives include no abdominal pain, chills or fever.      Review of Systems  Constitutional: Negative for fever and chills.  Respiratory: Negative for chest tightness and shortness of breath.   Gastrointestinal: Negative for abdominal pain.  Genitourinary: Positive for hematuria. Negative for frequency.       Objective:   Physical Exam  Constitutional: She appears well-developed and well-nourished.  HENT:  Head: Normocephalic and atraumatic.  Cardiovascular: Normal rate.   Pulmonary/Chest: Effort normal. She has wheezes.  Abdominal: Soft. She exhibits no distension. There is no tenderness.          Assessment & Plan:

## 2010-10-28 LAB — CULTURE, URINE COMPREHENSIVE

## 2010-10-31 MED ORDER — CIPROFLOXACIN HCL 500 MG PO TABS: 500.0000 mg | ORAL_TABLET | Freq: Two times a day (BID) | ORAL | Status: AC

## 2010-10-31 NOTE — Progress Notes (Signed)
Addended by: Tinnie Gens on: 10/31/2010 11:20 AM   Modules accepted: Orders

## 2010-11-08 ENCOUNTER — Other Ambulatory Visit: Payer: Self-pay | Admitting: Oncology

## 2010-11-08 DIAGNOSIS — Z9012 Acquired absence of left breast and nipple: Secondary | ICD-10-CM

## 2010-12-06 NOTE — Assessment & Plan Note (Signed)
NAME:  LENNIS, RADER NO.:  [id number]   MEDICAL RECORD NO.:  [id number]          PATIENT TYPE:  POB   LOCATION:  [facility] at [facility]         FACILITY:  [facility]   PHYSICIAN:  Allie Bossier, MD        DATE OF BIRTH:  May 24, 1930   DATE OF SERVICE:  09/09/2009                                  CLINIC NOTE   Ms. Bodi is a 77 year old lady who I saw on August 26, 2009, to  help her with her symptoms of fourth-degree cystocele and third-degree  uterine prolapse.  I fitted her with a #6 ring pessary that seem to work  well for her and I gave her Vagifem prescription.  However, her  oncologist (history of breast cancer currently on Arimidex) told her  that she cannot take the Vagifem, so she quit.  On exam, her vulva still  has significant atrophy and the prolapse is certainly unchanged.  I  removed the pessary.  I told her that as long as she is not using an  estrogen product in her vagina, she cannot use a pessary nor would I  feel comfortable operating on her without estrogen supplementation.  She  still complains of her incontinence issues, so I made referral to an  urologist, hopefully they have something else to offer that I do not.      Allie Bossier, MD     MCD/MEDQ  D:  09/09/2009  T:  09/09/2009  Job:  [id number]

## 2010-12-06 NOTE — Assessment & Plan Note (Signed)
NAME:  [name], [name] NO.:  [id number]   MEDICAL RECORD NO.:  [id number]          PATIENT TYPE:  POB   LOCATION:  [facility] at [facility]         FACILITY:  [facility]   PHYSICIAN:  Allie Bossier, MD        DATE OF BIRTH:  1929-09-01   DATE OF SERVICE:  08/26/2009                                  CLINIC NOTE   Ms. [name] is a 77 year old G2, P2 who comes in because she has  prolapse of her uterus and her bladder.  She has some incontinence when  she changes position.  There is definitely no urge incontinence type  symptoms.  She has been using Vagifem 10 mcg tablets per vagina for the  last 2 weeks.  She says that she feels it makes her somewhat bloated and  slightly nauseated.  I have explained that at this point, we will switch  them to twice a week dosing.   On exam, she has only a mild degree of atrophy at this point.  She has a  fourth-degree cystocele, third-degree uterine prolapse.   I fitted her with a #6 ring pessary.  She said that relieved her vaginal  pressure/discomfort.  She was able to remove it and then reinsert it  without difficulty.  I will see her back in 1-3 weeks for followup.  She  is aware that she should remove the pessary at night and clean it with  warm water and replace it in the morning.      Allie Bossier, MD     MCD/MEDQ  D:  08/26/2009  T:  08/27/2009  Job:  [id number]

## 2010-12-09 NOTE — Discharge Summary (Signed)
NAME:  Brianna [name], Brianna [name] NO.:  [id number]   MEDICAL RECORD NO.:  [id number]          PATIENT TYPE:  OIB   LOCATION:  3731                         FACILITY:  [facility]   PHYSICIAN:  Janeece Riggers. Severiano Gilbert, M.D.    DATE OF BIRTH:  December 27, 1929   DATE OF ADMISSION:  10/13/2004  DATE OF DISCHARGE:  10/14/2004                                 DISCHARGE SUMMARY   DISCHARGE DIAGNOSES:  1.  Nonischemic cardiomyopathy, status post implantable cardioverter-      defibrillator implantation this admission.  2.  History of breast cancer in the past, status post chemotherapy with      subsequent cardiomyopathy.  3.  Treated hypertension.   HOSPITAL COURSE:  The patient is a pleasant 77 year old female with history  of locally metastatic breast cancer.  She had chemotherapy.  This was  complicated by a cardiomyopathy.  She has done relatively well on medical  therapy.  She had normal coronaries in 2004.  Recent echocardiogram  demonstrated an ejection fraction of 35%.  She does have some fatigue and  dyspnea on exertion.  She was referred to Dr. Launa Grill and is admitted now  for elective implantation of an ICD.  She does have a narrow QRS.  The  patient underwent elective implantation of an ICD on October 13, 2004 without  complications.  We feel she can be discharged on October 14, 2004.  A chest x-  ray is pending, but she will be discharged later today if this is normal.   DISCHARGE MEDICATIONS:  1.  Arimidex 1 mg a day.  2.  Coreg 6.25 mg in the morning, 3.125 mg in the evening.  3.  Hydrochlorothiazide 12.5 mg a day.  4.  Altace 2.5 mg a day.  5.  Aspirin 81 mg a day.   LABORATORY DATA:  Labs were done on prior to admission and show a white  count of 5.3, hemoglobin of 13, hematocrit 37,platelets 201,000.  TSH 4.1.  BUN 14, creatinine 1.0, potassium 4.8.  Urinalysis is unremarkable.  INR  0.87.   CONDITION ON DISCHARGE:  She is in sinus rhythm at discharge, overriding her  device,  which is set at 40.  ICD was interrogated prior to discharge.   FOLLOWUP:  She will follow up with Dr. Severiano Gilbert next week on the 29th at 2 p.m.  Her pacer site is without hematoma.      LKK/MEDQ  D:  10/14/2004  T:  10/14/2004  Job:  [id number]   cc:   Nanetta Batty, M.D.  Fax: [phone]   Shelbie Proctor. Shawnie Pons, M.D.  Fax: [phone]

## 2010-12-09 NOTE — Cardiovascular Report (Signed)
NAME:  Brianna [name], Brianna [name] NO.:  [id number]   MEDICAL RECORD NO.:  [id number]          PATIENT TYPE:  OIB   LOCATION:  3731                         FACILITY:  [facility]   PHYSICIAN:  Janeece Riggers. Severiano Gilbert, M.D.    DATE OF BIRTH:  [birth year]   DATE OF PROCEDURE:  10/13/2004  DATE OF DISCHARGE:                              CARDIAC CATHETERIZATION   PROCEDURE PERFORMED:  1.  ICD implantation.  2.  Fluoroscopy for implantation.  3.  ICD device check.   PREPROCEDURE DIAGNOSES:  Nonischemic cardiomyopathy with congestive heart  failure; ejection fraction less than 35% (chronic).   POSTPROCEDURE DIAGNOSES:  Nonischemic cardiomyopathy with congestive heart  failure; ejection fraction less than 35% (chronic).   OPERATOR:  Mark E. Peele, M.D.   ESTIMATED BLOOD LOSS:  Less than 30 cc.   COMPLICATIONS:  None.   PROCEDURE IN DETAIL:  The patient was brought to the EP laboratory in fasted  semi-sedated state.  The patient's right pectoral area was prepped and  draped in the usual manner (patient is left handed and has had a left-sided  mastectomy).  Anesthesia was obtained with subcutaneous injections of 1%  lidocaine.  Conscious sedation was obtained with intermittent injections of  midazolam and fentanyl.  Ancef 1 g IV was infused immediately prior to skin  incision for antimicrobial prophylaxis.  A 5 cm incision, 2 cm inferior to  the right clavicle and reaching the right deltopectoral groove, was made to  the level of the prepectoral fascia with a combination of sharp and blunt  dissection.  In the bed of the incision was an old ligature from a prior  port placement, that was removed without difficulty.  The tissues were quite  viable, with very minimal surrounding scarring.  Extended  the incision line  along the prepectoral fascia and appropriate sized ICD pocket was easily  fashioned by blunt dissection.  Hemostasis was obtained by electrocautery.  The pocket was copiously  irrigated with antibiotic-containing solution.   Using a blind anterior first rib technique, the left axillary vein was  easily entered without difficulty in an extrathoracic manner.  Using a  thinwalled needle technique, a 0.35 inch guide wire was introduced into the  central venous circulation and advanced to the level of the right atrium.  Over the wire a 7-French safe sheath system was introduced without  difficulty, and the dilator and wire removed.  Via the safe sheath, a  Medtronic Model [id number] passive fixation ICD lead was advanced  fluoroscopically into the right atrium, then positioned easily within the  right ventricular apex.  Right ventricular placement was heralded by ectopic  beats, before positioning in the right ventricular apex in a quite area.   After determining electronic characteristics, an appropriate slat was  introduced.  The lead was sutured in place to the prepectoral fascia without  difficulty.  The lead was then connected to a Medtronic Model #[id number]  (Serial No. [id number]) defibrillator; which was then placed within the  preformed ICD pocket.  The pocket was closed in layers with 2-0 and 4-0  Vicryl.  Skin incisions were  reinforced with Steri-Strips and a bulky  pressure dressing applied.   After electronic testing of the device, the patient returned to the holding  area in stable hemodynamic condition; having tolerated the procedure well  hemodynamically.   Electronic characteristics to the lead through the stimulator demonstrated a  capture threshold of 0.4 V at 0.5 msec; impedance of 1302 ohms, and a  measured R-wave of 7.5 mV.  There was an absence of diaphragmatic pacing of  maps of output device.   A single episode of ventricular fibrillation was easily induced using a T-  wave shock protocol.  At a least sensitive setting of 1.2 mV, there was  occasional dropout during the sensing and charging phases; although this did  not significantly delay  therapy.  A single 20 joule shock resulted in  resumption of a perfusing rhythm, with high-energy shock impedance of 43  ohms.  This yielded a safety margin of greater than 10 joules and was quite  acceptable.   The device was programmed to a single zone of tachy therapy, with heart  rates greater then 180 beats per minute.  The patient will receive 30,  followed by 35 joule shocks.  The pacemaker function was programmed into a  VVI at 5 V at 0.5 msec -- as patient is not in need of brady pacing at this  time.      MEP/MEDQ  D:  10/13/2004  T:  10/13/2004  Job:  [id number]

## 2010-12-09 NOTE — Op Note (Signed)
NAME:  Brianna [name], Brianna [name]                        ACCOUNT NO.:  [id number]   MEDICAL RECORD NO.:  [id number]                   PATIENT TYPE:  AMB   LOCATION:  DAY                                  FACILITY:  [facility]   PHYSICIAN:  Timothy E. Earlene Plater, M.D.              DATE OF BIRTH:  [birth year]   DATE OF PROCEDURE:  DATE OF DISCHARGE:                                 OPERATIVE REPORT   PREOPERATIVE DIAGNOSIS:  Carcinoma left breast.   POSTOPERATIVE DIAGNOSIS:  Carcinoma left breast.   OPERATION/PROCEDURE:  Placement of Port-A-Cath.   SURGEON:  Timothy E. Earlene Plater, M.D.   ANESTHESIA:  Local, standby.   INDICATIONS:  [name] had recently undergone a left mastectomy  for breast cancer, positive nodes and will require chemotherapy which is to  begin in four days with Dr. Donnie Coffin.  She is ready now and informed about the  Port-A-Cath.  Operative permit is signed.  The patient is evaluated.   DESCRIPTION OF PROCEDURE:  She is taken to the operating room, placed  supine, IV sedation given, placed in the head down position.  The entire  neck and chest were prepped and draped in the usual fashion.  0.25% Marcaine  with epinephrine is used throughout.  The right subclavian vein was isolated  on the first pass with percutaneous puncture, a guide wire placed through  the inserting needle and throughout fluoroscopy was used to ascertain  correct position.  Needle removed.  Insertion site opened with an 11 blade  and then the introducer placed over the guide wire, ascertained to be in the  correct position.  Guide wire removed.  The irrigating catheter was then  inserted through the introducer and ascertained to be in a good position.  The introducer removed.  The catheter flushed and irrigated well.  This was  at the 15 cm mark at the skin.  A separate pocket was made below this  insertion site and the Bard port was placed there.  The catheter tunneled  subcutaneously and connected to the  Bard port and locked in place.  Bard  port was connected to the prepectoral fascia with 0 Prolene.  Again the  entire system flushed and irrigated well.  The wound was closed with  Monocryl.  Steri-Strips.  Then the access device was irrigated.  The access  device was placed percutaneously correctly into the port.  It flushed and  irrigated well.  And then Tegaderm  was used to cover the wound. She tolerated this very well.  All counts  correct.  No obvious complications.  She was taken to the recovery room.   Written and verbal instructions given.  She will be seen in followup.  Timothy E. Earlene Plater, M.D.    TED/MEDQ  D:  02/12/2003  T:  02/12/2003  Job:  [id number]   cc:   Pierce Crane, M.D.  [address] [facility]  [place]  [place] [address]  Fax: [phone]

## 2010-12-09 NOTE — H&P (Signed)
NAME:  Brianna [name], Brianna [name]                        ACCOUNT NO.:  [id number]   MEDICAL RECORD NO.:  [id number]                   PATIENT TYPE:  INP   LOCATION:  0380                                 FACILITY:  [facility]   PHYSICIAN:  Timothy E. Earlene Plater, M.D.              DATE OF BIRTH:  [birth year]   DATE OF ADMISSION:  01/16/2003  DATE OF DISCHARGE:                                HISTORY & PHYSICAL   ADMISSION DIAGNOSIS:  Infected hematoma at mastectomy site.   HISTORY OF PRESENT ILLNESS:  This is a 77 year old Caucasian female recently  seen and evaluated in the office, and treated with a left radical mastectomy  for extensive carcinoma.  That was carried out on December 30, 2002.  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  The patient had made a  smooth and uneventful recovery and was at home caring for herself.  She had  been in the office on three occasions, the first two were accompanied by the  formation of a small hematoma and, after manipulation of the distant drains,  the hematoma resolved.  However, she walked into the office today with an  obviously infected wound.  It was quite distended and full of fluid.  I  opened that in the office and drained an infected hematoma.  She was  immediately transferred to the hospital where evaluation and antibiotics  were accomplished, and she was admitted for operation tonight.   PAST MEDICAL HISTORY:  Contained in the old chart, but briefly it includes  the fact that she had excellent health all of her life.  She had a mass in  her breast for some time, but because of the recent death of her husband  from pancreatic cancer, she did not want to seek treatment.   MEDICATIONS:  She takes no regular medications.  She does take supplements.   ALLERGIES:  No known drug allergies.   PAST SURGICAL HISTORY:  No important past surgeries.   SOCIAL HISTORY:  The patient  does smoke cigarettes.   FAMILY HISTORY:  Coronary artery disease.   Because of her first cardiogram, she was seen and evaluated by Cardiology  and cleared for surgery with cardiac catheterization.  She has been seen by  Oncology and will follow with them in the postrecovery period.   LABORATORY DATA:  Today her laboratory data revealed an anemia; hemoglobin  9.1, hematocrit 26.  Laboratory chemistries were satisfactory with sodium  133, glucose 115.   PHYSICAL EXAMINATION:  GENERAL:  This is a stressed woman of stated age.  VITAL SIGNS:  Her temperature in the office was 97, although it has been  higher.  She was nervous, heart rate was 110.  HEENT:  Unremarkable, except for pale mucous membranes.  CHEST:  Clear.  HEART:  Heart sounds were regular and normal.  BREASTS:  Left mastectomy site  as described was distended, inflamed, and  contained an infected hematoma.  ABDOMEN:  Negative.   PLAN:  She is admitted, as noted above, for surgery.                                               Timothy E. Earlene Plater, M.D.    TED/MEDQ  D:  01/16/2003  T:  01/16/2003  Job:  [id number]

## 2010-12-12 ENCOUNTER — Ambulatory Visit
Admission: RE | Admit: 2010-12-12 | Discharge: 2010-12-12 | Disposition: A | Discharge status: Home / Self Care | Payer: Medicare Other | Source: Ambulatory Visit | Attending: Oncology | Admitting: Oncology

## 2010-12-12 DIAGNOSIS — Z9012 Acquired absence of left breast and nipple: Secondary | ICD-10-CM

## 2010-12-29 ENCOUNTER — Other Ambulatory Visit: Payer: Self-pay | Admitting: Oncology

## 2010-12-29 ENCOUNTER — Encounter (HOSPITAL_BASED_OUTPATIENT_CLINIC_OR_DEPARTMENT_OTHER): Payer: Medicare Other | Admitting: Oncology

## 2010-12-29 DIAGNOSIS — M899 Disorder of bone, unspecified: Secondary | ICD-10-CM

## 2010-12-29 LAB — CBC WITH DIFFERENTIAL/PLATELET
BASO%: 0.3 % (ref 0.0–2.0)
LYMPH%: 29.2 % (ref 14.0–49.7)
MCHC: 35.4 g/dL (ref 31.5–36.0)
MONO#: 0.5 10*3/uL (ref 0.1–0.9)
Platelets: 173 10*3/uL (ref 145–400)
RBC: 3.63 10*6/uL — ABNORMAL LOW (ref 3.70–5.45)
WBC: 5.3 10*3/uL (ref 3.9–10.3)

## 2010-12-30 LAB — COMPREHENSIVE METABOLIC PANEL
ALT: 12 U/L (ref 0–35)
AST: 28 U/L (ref 0–37)
Alkaline Phosphatase: 68 U/L (ref 39–117)
CO2: 27 mEq/L (ref 19–32)
Sodium: 136 mEq/L (ref 135–145)
Total Bilirubin: 0.4 mg/dL (ref 0.3–1.2)
Total Protein: 6.9 g/dL (ref 6.0–8.3)

## 2010-12-30 LAB — VITAMIN D 25 HYDROXY (VIT D DEFICIENCY, FRACTURES): Vit D, 25-Hydroxy: 68 ng/mL (ref 30–89)

## 2011-01-05 ENCOUNTER — Other Ambulatory Visit: Payer: Self-pay | Admitting: Oncology

## 2011-01-05 ENCOUNTER — Encounter: Payer: Medicare Other | Admitting: Oncology

## 2011-01-05 DIAGNOSIS — C50919 Malignant neoplasm of unspecified site of unspecified female breast: Secondary | ICD-10-CM

## 2011-01-05 DIAGNOSIS — Z780 Asymptomatic menopausal state: Secondary | ICD-10-CM

## 2011-01-05 DIAGNOSIS — Z853 Personal history of malignant neoplasm of breast: Secondary | ICD-10-CM

## 2011-01-05 DIAGNOSIS — Z1231 Encounter for screening mammogram for malignant neoplasm of breast: Secondary | ICD-10-CM

## 2011-01-05 DIAGNOSIS — Z9012 Acquired absence of left breast and nipple: Secondary | ICD-10-CM

## 2011-01-13 ENCOUNTER — Encounter (HOSPITAL_COMMUNITY)
Admission: RE | Admit: 2011-01-13 | Discharge: 2011-01-13 | Disposition: A | Discharge status: Home / Self Care | Payer: Medicare Other | Source: Ambulatory Visit | Attending: Oncology | Admitting: Oncology

## 2011-01-13 ENCOUNTER — Encounter (HOSPITAL_COMMUNITY): Payer: Self-pay

## 2011-01-13 DIAGNOSIS — M25559 Pain in unspecified hip: Secondary | ICD-10-CM | POA: Insufficient documentation

## 2011-01-13 DIAGNOSIS — M47817 Spondylosis without myelopathy or radiculopathy, lumbosacral region: Secondary | ICD-10-CM | POA: Insufficient documentation

## 2011-01-13 DIAGNOSIS — M545 Low back pain, unspecified: Secondary | ICD-10-CM | POA: Insufficient documentation

## 2011-01-13 DIAGNOSIS — C50919 Malignant neoplasm of unspecified site of unspecified female breast: Secondary | ICD-10-CM | POA: Insufficient documentation

## 2011-01-13 DIAGNOSIS — M79609 Pain in unspecified limb: Secondary | ICD-10-CM | POA: Insufficient documentation

## 2011-01-13 DIAGNOSIS — M4180 Other forms of scoliosis, site unspecified: Secondary | ICD-10-CM | POA: Insufficient documentation

## 2011-01-13 MED ORDER — TECHNETIUM TC 99M MEDRONATE IV KIT
23.7000 | PACK | Freq: Once | INTRAVENOUS | Status: AC | PRN
  Administered 2011-01-13: 23.7 via INTRAVENOUS

## 2011-01-16 ENCOUNTER — Ambulatory Visit (INDEPENDENT_AMBULATORY_CARE_PROVIDER_SITE_OTHER): Payer: Medicare Other | Admitting: Home Health Services

## 2011-01-16 ENCOUNTER — Encounter: Payer: Self-pay | Admitting: Home Health Services

## 2011-01-16 VITALS — BP 152/83 | HR 63 | Temp 97.9°F | Ht 62.5 in | Wt 126.7 lb

## 2011-01-16 DIAGNOSIS — Z0000 Encounter for general adult medical examination without abnormal findings: Secondary | ICD-10-CM

## 2011-01-16 NOTE — Patient Instructions (Signed)
1. Continue to focus your diet around vegetables and healthy proteins, like chicken and fish. 2. Consider quitting smoking completely. 3. Try to have more movement in your daily life around your home. 4. Bring in copy of your Living Will to Dr. Shawnie Pons.

## 2011-01-16 NOTE — Progress Notes (Signed)
Patient here for annual wellness visit, patient reports: Risk Factors/Conditions needing evaluation or treatment: Pt does not have any risk factors that need evaluation. Home Safety: Pt lives by self in 1 story home. Pt struggles with lifting things and has help for cleaning/yard work.  Pt reports having smoke detectors and adaptive equipment in the bathroom. Other Information: Corrective lens: Pt wears daily corrective lens and visits eye doctor ever year. Dentures: Pt does not have dentures and is in the process of getting teeth pulled but is changing dentist. Memory: Pt reports some memory loss. Patient's Mini Mental Score (recorded in doc. flowsheet): 27  Balance/Gait: Pt reports lot's of pain in back which affects her gait.  Balance Abnormal Patient value  Sitting balance    Sit to stand    Attempts to arise    Immediate standing balance    Standing balance    Nudge    Eyes closed- Romberg    Tandem stance x Can not do  Back lean    Neck Rotation    360 degree turn    Sitting down     Gait Abnormal Patient value  Initiation of gait    Step length-left    Step length-right    Step height-left    Step height-right    Step symmetry    Step continuity    Path deviation    Trunk movement    Walking stance x A little hunched over      Annual Wellness Visit Requirements Recorded Today In  Medical, family, social history Past Medical, Family, Social History Section  Current providers Care team  Current medications Medications  Wt, BP, Ht, BMI Vital signs  Hearing assessment (welcome visit) declined  Tobacco, alcohol, illicit drug use History  ADL Nurse Assessment  Depression Screening Nurse Assessment  Cognitive impairment Nurse Assessment  Mini Mental Status Document Flowsheet  Fall Risk Nurse Assessment  Home Safety Progress Note  End of Life Planning (welcome visit) Social Documentation  Medicare preventative services Progress Note  Risk factors/conditions  needing evaluation/treatment Progress Note  Personalized health advice Patient Instructions, goals, letter  Diet & Exercise Social Documentation  Emergency Contact Social Documentation  Seat Belts Social Documentation  Sun exposure/protection Social Documentation    Prevention Plan: Pt is up to date on recommended screenings per her reporting.   Recommended Medicare Prevention Screenings Women over 49 Test For Frequency Date of Last- BOLD if needed  Breast Cancer 1-2 yrs 5/12  Cervical Cancer 1-3 yrs Not indicated  Colorectal Cancer 1-10 yrs Pt reported done  Osteoporosis once Pt reported done  Cholesterol 5 yrs 7/09  Diabetes yearly Non diabetic  HIV yearly declined  Influenza Shot yearly   Pneumonia Shot once 9/11  Zostavax Shot once 4/09

## 2011-02-24 NOTE — Progress Notes (Signed)
I have reviewed this visit and discussed with Suzanne Lineberry and agree with her documentation  

## 2011-09-02 DIAGNOSIS — I428 Other cardiomyopathies: Secondary | ICD-10-CM | POA: Diagnosis not present

## 2011-09-02 DIAGNOSIS — I509 Heart failure, unspecified: Secondary | ICD-10-CM | POA: Diagnosis not present

## 2011-09-02 DIAGNOSIS — I472 Ventricular tachycardia: Secondary | ICD-10-CM | POA: Diagnosis not present

## 2011-12-02 DIAGNOSIS — I428 Other cardiomyopathies: Secondary | ICD-10-CM | POA: Diagnosis not present

## 2011-12-02 DIAGNOSIS — I472 Ventricular tachycardia: Secondary | ICD-10-CM | POA: Diagnosis not present

## 2011-12-02 DIAGNOSIS — I509 Heart failure, unspecified: Secondary | ICD-10-CM | POA: Diagnosis not present

## 2011-12-13 ENCOUNTER — Telehealth: Payer: Self-pay | Admitting: Oncology

## 2011-12-13 NOTE — Telephone Encounter (Signed)
lmonvm for pt re appts for 7/17 and 7/24. Schedule mailed.

## 2011-12-14 ENCOUNTER — Other Ambulatory Visit: Payer: Medicare Other

## 2011-12-14 ENCOUNTER — Ambulatory Visit: Payer: Medicare Other

## 2011-12-15 ENCOUNTER — Telehealth: Payer: Self-pay | Admitting: *Deleted

## 2011-12-15 NOTE — Telephone Encounter (Signed)
Refill request received from [facility], [address] For Advair 100/50 Diskus. Will forward to Dr. Shawnie Pons.

## 2011-12-19 MED ORDER — FLUTICASONE-SALMETEROL 100-50 MCG/DOSE IN AEPB: 1.0000 | INHALATION_SPRAY | Freq: Two times a day (BID) | RESPIRATORY_TRACT | Status: DC

## 2011-12-25 ENCOUNTER — Ambulatory Visit
Admission: RE | Admit: 2011-12-25 | Discharge: 2011-12-25 | Disposition: A | Discharge status: Home / Self Care | Payer: Medicare Other | Source: Ambulatory Visit | Attending: Oncology | Admitting: Oncology

## 2011-12-25 DIAGNOSIS — Z853 Personal history of malignant neoplasm of breast: Secondary | ICD-10-CM

## 2011-12-25 DIAGNOSIS — Z9012 Acquired absence of left breast and nipple: Secondary | ICD-10-CM

## 2011-12-25 DIAGNOSIS — Z1231 Encounter for screening mammogram for malignant neoplasm of breast: Secondary | ICD-10-CM | POA: Diagnosis not present

## 2011-12-25 DIAGNOSIS — Z780 Asymptomatic menopausal state: Secondary | ICD-10-CM | POA: Diagnosis not present

## 2011-12-25 DIAGNOSIS — M899 Disorder of bone, unspecified: Secondary | ICD-10-CM | POA: Diagnosis not present

## 2012-01-19 ENCOUNTER — Encounter: Payer: Self-pay | Admitting: Home Health Services

## 2012-02-07 ENCOUNTER — Other Ambulatory Visit (HOSPITAL_BASED_OUTPATIENT_CLINIC_OR_DEPARTMENT_OTHER): Payer: Medicare Other | Admitting: Lab

## 2012-02-07 DIAGNOSIS — M949 Disorder of cartilage, unspecified: Secondary | ICD-10-CM | POA: Diagnosis not present

## 2012-02-07 DIAGNOSIS — M899 Disorder of bone, unspecified: Secondary | ICD-10-CM

## 2012-02-07 DIAGNOSIS — C50419 Malignant neoplasm of upper-outer quadrant of unspecified female breast: Secondary | ICD-10-CM | POA: Diagnosis not present

## 2012-02-07 LAB — CBC WITH DIFFERENTIAL/PLATELET
Basophils Absolute: 0 10*3/uL (ref 0.0–0.1)
HCT: 35.6 % (ref 34.8–46.6)
HGB: 12.1 g/dL (ref 11.6–15.9)
LYMPH%: 30.4 % (ref 14.0–49.7)
MCH: 31.5 pg (ref 25.1–34.0)
MONO#: 0.5 10*3/uL (ref 0.1–0.9)
NEUT%: 59.1 % (ref 38.4–76.8)
Platelets: 172 10*3/uL (ref 145–400)
lymph#: 2 10*3/uL (ref 0.9–3.3)

## 2012-02-08 LAB — CANCER ANTIGEN 27.29: CA 27.29: 24 U/mL (ref 0–39)

## 2012-02-08 LAB — COMPREHENSIVE METABOLIC PANEL
Albumin: 4 g/dL (ref 3.5–5.2)
BUN: 12 mg/dL (ref 6–23)
Calcium: 9.4 mg/dL (ref 8.4–10.5)
Chloride: 95 mEq/L — ABNORMAL LOW (ref 96–112)
Glucose, Bld: 89 mg/dL (ref 70–99)
Potassium: 4.1 mEq/L (ref 3.5–5.3)

## 2012-02-08 LAB — VITAMIN D 25 HYDROXY (VIT D DEFICIENCY, FRACTURES): Vit D, 25-Hydroxy: 61 ng/mL (ref 30–89)

## 2012-02-13 DIAGNOSIS — R5381 Other malaise: Secondary | ICD-10-CM | POA: Diagnosis not present

## 2012-02-13 DIAGNOSIS — R0602 Shortness of breath: Secondary | ICD-10-CM | POA: Diagnosis not present

## 2012-02-13 DIAGNOSIS — Z79899 Other long term (current) drug therapy: Secondary | ICD-10-CM | POA: Diagnosis not present

## 2012-02-13 DIAGNOSIS — I509 Heart failure, unspecified: Secondary | ICD-10-CM | POA: Diagnosis not present

## 2012-02-13 DIAGNOSIS — R6889 Other general symptoms and signs: Secondary | ICD-10-CM | POA: Diagnosis not present

## 2012-02-14 ENCOUNTER — Telehealth: Payer: Self-pay | Admitting: Oncology

## 2012-02-14 ENCOUNTER — Ambulatory Visit (HOSPITAL_BASED_OUTPATIENT_CLINIC_OR_DEPARTMENT_OTHER): Payer: Medicare Other | Admitting: Oncology

## 2012-02-14 VITALS — BP 136/75 | HR 75 | Temp 98.0°F | Ht 62.5 in | Wt 129.2 lb

## 2012-02-14 DIAGNOSIS — Z853 Personal history of malignant neoplasm of breast: Secondary | ICD-10-CM

## 2012-02-14 DIAGNOSIS — M949 Disorder of cartilage, unspecified: Secondary | ICD-10-CM

## 2012-02-14 DIAGNOSIS — C50419 Malignant neoplasm of upper-outer quadrant of unspecified female breast: Secondary | ICD-10-CM

## 2012-02-14 DIAGNOSIS — M899 Disorder of bone, unspecified: Secondary | ICD-10-CM | POA: Diagnosis not present

## 2012-02-14 DIAGNOSIS — Z170 Estrogen receptor positive status [ER+]: Secondary | ICD-10-CM

## 2012-02-14 DIAGNOSIS — M810 Age-related osteoporosis without current pathological fracture: Secondary | ICD-10-CM

## 2012-02-14 NOTE — Telephone Encounter (Signed)
gve the pt her July 2014 appt calendar along with the mamm[facility] appt [facility]

## 2012-02-14 NOTE — Progress Notes (Signed)
Hematology and Oncology Follow Up Visit  Brianna [name] [id number] March 20, 1930 78 y.o. 02/14/2012 3:41 PM   DIAGNOSIS:     PAST THERAPY:  PROBLEMS: 1. T2 N3, ER positive, PR negative, HER2 negative breast cancer status post dose dense AC followed by Taxol completed 12/04. 2. Status post radiation therapy to chest wall, on Arimidex since December 2004. 3. Osteopenia. 4. History of cardiac disease status post defibrillator placement.  Interim History: she has been doing well from a breast cancer and cardiac point of view; no intercurrent illness.  Medications: I have reviewed the patient's current medications.  Allergies: No Known Allergies  Past Medical History, Surgical history, Social history, and Family History were reviewed and updated.  Review of Systems: Constitutional:  Negative for fever, chills, night sweats, anorexia, weight loss, pain. Cardiovascular: no chest pain or dyspnea on exertion Respiratory: no cough, shortness of breath, or wheezing Neurological: negative Dermatological: negative ENT: negative Skin Gastrointestinal: no abdominal pain, change in bowel habits, or black or bloody stools Genito-Urinary: no dysuria, trouble voiding, or hematuria Hematological and Lymphatic: negative Breast: negative for breast lumps Musculoskeletal: negative Remaining ROS negative.  Physical Exam:  Blood pressure 136/75, pulse 75, temperature 98 F (36.7 C), height 5' 2.5" (1.588 m), weight 129 lb 3.2 oz (58.605 kg).  ECOG: 0 HEENT:  Sclerae anicteric, conjunctivae pink.  Oropharynx clear.  No mucositis or candidiasis.  Nodes:  No cervical, supraclavicular, or axillary lymphadenopathy palpated.  Breast Exam:  Right breast is benign.  No masses, discharge, skin change, or nipple inversion.  Left breast is surgically absent..  Lungs:  Clear to auscultation bilaterally.  No crackles, rhonchi, or wheezes.  Heart:  Regular rate and rhythm, defibrillator in place.  Abdomen:   Soft, nontender.  Positive bowel sounds.  No organomegaly or masses palpated.  Musculoskeletal:  No focal spinal tenderness to palpation.  Extremities:  Benign.  No peripheral edema or cyanosis.  Skin:  Benign.  Neuro:  Nonfocal.       Lab Results: Lab Results  Component Value Date   WBC 6.5 02/07/2012   HGB 12.1 02/07/2012   HCT 35.6 02/07/2012   MCV 92.4 02/07/2012   PLT 172 02/07/2012     Chemistry      Component Value Date/Time   NA 132* 02/07/2012 1339   K 4.1 02/07/2012 1339   CL 95* 02/07/2012 1339   CO2 31 02/07/2012 1339   BUN 12 02/07/2012 1339   CREATININE 0.97 02/07/2012 1339      Component Value Date/Time   CALCIUM 9.4 02/07/2012 1339   ALKPHOS 55 02/07/2012 1339   AST 23 02/07/2012 1339   ALT 11 02/07/2012 1339   BILITOT 0.4 02/07/2012 1339       Radiological Studies:  No results found.   IMPRESSIONS AND PLAN: A 78 y.o. female with   Hx of T3N2 breast cancer , 2004, s/p mrm, xrt on ongoing arimidex. , dexa showing mild osteopenia. No evidence of recurrence. F/U in 1 yr  Spent more than half the time coordinating care, as well as discussion of BMI and its implications.      Jaidan Stachnik 7/24/20133:41 PM Cell [phone]

## 2012-02-27 DIAGNOSIS — I5042 Chronic combined systolic (congestive) and diastolic (congestive) heart failure: Secondary | ICD-10-CM | POA: Diagnosis not present

## 2012-02-27 DIAGNOSIS — R5381 Other malaise: Secondary | ICD-10-CM | POA: Diagnosis not present

## 2012-02-27 DIAGNOSIS — R5383 Other fatigue: Secondary | ICD-10-CM | POA: Diagnosis not present

## 2012-03-02 DIAGNOSIS — I428 Other cardiomyopathies: Secondary | ICD-10-CM | POA: Diagnosis not present

## 2012-03-02 DIAGNOSIS — I509 Heart failure, unspecified: Secondary | ICD-10-CM | POA: Diagnosis not present

## 2012-03-02 DIAGNOSIS — I472 Ventricular tachycardia: Secondary | ICD-10-CM | POA: Diagnosis not present

## 2012-03-30 ENCOUNTER — Other Ambulatory Visit: Payer: Self-pay | Admitting: Oncology

## 2012-03-30 DIAGNOSIS — Z853 Personal history of malignant neoplasm of breast: Secondary | ICD-10-CM

## 2012-04-02 ENCOUNTER — Encounter: Payer: Self-pay | Admitting: Home Health Services

## 2012-04-02 ENCOUNTER — Ambulatory Visit (INDEPENDENT_AMBULATORY_CARE_PROVIDER_SITE_OTHER): Payer: Medicare Other | Admitting: Home Health Services

## 2012-04-02 VITALS — BP 122/70 | HR 60 | Temp 96.8°F | Ht 62.5 in | Wt 128.1 lb

## 2012-04-02 DIAGNOSIS — Z0000 Encounter for general adult medical examination without abnormal findings: Secondary | ICD-10-CM | POA: Diagnosis not present

## 2012-04-02 NOTE — Patient Instructions (Signed)
1. Consider walking on treadmill 2-3 times a week for 10-15 minutes. 2. Continue to maintain your weight, by eating several small meals a day. 3. Schedule an appointment with Dr. Shawnie Pons to follow up with your shoulder if it becomes worse.

## 2012-04-02 NOTE — Progress Notes (Signed)
Patient here for annual wellness visit, patient reports: Risk Factors/Conditions needing evaluation or treatment: Pt reports that she may have pulled something in her left arm 2-3 weeks ago  She feel discomfort most when she picks up things.  Suggested pt schedule appointment with PCP.  Pt declined. Home Safety: Pt lives by self.  Pt reports having smoke detectors. Other Information: Corrective lens: Pt wears daily corrective lens.  Reports annual eye exam.s Dentures: Pt does not have dentures.  Waiting on getting bottom partial. Memory: Pt reports some memory loss.  Patient's Mini Mental Score (recorded in doc. flowsheet): 28  Balance/Gait:  Balance Abnormal Patient value  Sitting balance    Sit to stand x Uses arms  Attempts to arise    Immediate standing balance    Standing balance    Nudge    Eyes closed- Romberg    Tandem stance x unable  Back lean x Not much range  Neck Rotation    360 degree turn    Sitting down x Uses arms   Gait Abnormal Patient value  Initiation of gait    Step length-left    Step length-right    Step height-left    Step height-right    Step symmetry    Step continuity    Path deviation    Trunk movement x ridgid  Walking stance x Slightly bent over      Annual Wellness Visit Requirements Recorded Today In  Medical, family, social history Past Medical, Family, Social History Section  Current providers Care team  Current medications Medications  Wt, BP, Ht, BMI Vital signs  Tobacco, alcohol, illicit drug use History  ADL Nurse Assessment  Depression Screening Nurse Assessment  Cognitive impairment Nurse Assessment  Mini Mental Status Document Flowsheet  Fall Risk Nurse Assessment  Home Safety Progress Note  End of Life Planning (welcome visit) Social Documentation  Medicare preventative services Progress Note  Risk factors/conditions needing evaluation/treatment Progress Note  Personalized health advice Patient Instructions, goals,  letter  Diet & Exercise Social Documentation  Emergency Contact Social Documentation  Seat Belts Social Documentation  Sun exposure/protection Social Documentation    Prevention Plan:   Recommended Medicare Prevention Screenings Women over 65 Test For Frequency Date of Last- BOLD if needed  Breast Cancer 1-2 yrs 6/12  Cervical Cancer 1-3 yrs NI-due to age  Colorectal Cancer 1-10 yrs NI-due to age  Osteoporosis once 6/12  Cholesterol 5 yrs 7/09  Diabetes yearly 7/13  HIV yearly Declined  Influenza Shot yearly Pt reported done at Pharmacy 03/30/12.   Pneumonia Shot once 9/11  Zostavax Shot once 4/09

## 2012-04-03 ENCOUNTER — Encounter: Payer: Self-pay | Admitting: Home Health Services

## 2012-04-03 NOTE — Progress Notes (Signed)
Patient ID: Brianna [name], female   DOB: 1929/10/03, 78 y.o.   MRN: [id number]  I have reviewed this visit and discussed with Brianna [name] and agree with her documentation.

## 2012-05-06 DIAGNOSIS — I428 Other cardiomyopathies: Secondary | ICD-10-CM | POA: Diagnosis not present

## 2012-05-13 DIAGNOSIS — E782 Mixed hyperlipidemia: Secondary | ICD-10-CM | POA: Diagnosis not present

## 2012-05-13 DIAGNOSIS — Z79899 Other long term (current) drug therapy: Secondary | ICD-10-CM | POA: Diagnosis not present

## 2012-05-27 ENCOUNTER — Encounter: Payer: Self-pay | Admitting: *Deleted

## 2012-05-27 NOTE — Progress Notes (Signed)
Faxed order for mastectomy supplies to [facility] at [phone].

## 2012-06-06 DIAGNOSIS — I509 Heart failure, unspecified: Secondary | ICD-10-CM | POA: Diagnosis not present

## 2012-06-06 DIAGNOSIS — R6889 Other general symptoms and signs: Secondary | ICD-10-CM | POA: Diagnosis not present

## 2012-06-06 DIAGNOSIS — Z4502 Encounter for adjustment and management of automatic implantable cardiac defibrillator: Secondary | ICD-10-CM | POA: Diagnosis not present

## 2012-08-25 ENCOUNTER — Other Ambulatory Visit: Payer: Self-pay | Admitting: Family Medicine

## 2012-09-26 DIAGNOSIS — I509 Heart failure, unspecified: Secondary | ICD-10-CM | POA: Diagnosis not present

## 2012-09-26 DIAGNOSIS — E782 Mixed hyperlipidemia: Secondary | ICD-10-CM | POA: Diagnosis not present

## 2012-10-01 DIAGNOSIS — R6889 Other general symptoms and signs: Secondary | ICD-10-CM | POA: Diagnosis not present

## 2012-10-01 DIAGNOSIS — E782 Mixed hyperlipidemia: Secondary | ICD-10-CM | POA: Diagnosis not present

## 2012-10-12 ENCOUNTER — Telehealth: Payer: Self-pay | Admitting: Oncology

## 2012-10-12 NOTE — Telephone Encounter (Signed)
S/W THE PT AND SHE IS AWARE OF THE REASSIGNMENT FROM DR RUBIN TO DR Darnelle Catalan ALONG WITH A F/U LETTER AND APPT CALENDAR

## 2012-10-19 ENCOUNTER — Encounter: Payer: Self-pay | Admitting: Oncology

## 2012-12-17 DIAGNOSIS — H04129 Dry eye syndrome of unspecified lacrimal gland: Secondary | ICD-10-CM | POA: Diagnosis not present

## 2012-12-17 DIAGNOSIS — H26499 Other secondary cataract, unspecified eye: Secondary | ICD-10-CM | POA: Diagnosis not present

## 2013-01-08 ENCOUNTER — Other Ambulatory Visit: Payer: Self-pay | Admitting: *Deleted

## 2013-01-08 ENCOUNTER — Other Ambulatory Visit: Payer: Self-pay | Admitting: Cardiovascular Disease

## 2013-01-08 DIAGNOSIS — R9431 Abnormal electrocardiogram [ECG] [EKG]: Secondary | ICD-10-CM

## 2013-01-08 DIAGNOSIS — R6889 Other general symptoms and signs: Secondary | ICD-10-CM | POA: Diagnosis not present

## 2013-01-08 DIAGNOSIS — I509 Heart failure, unspecified: Secondary | ICD-10-CM | POA: Diagnosis not present

## 2013-01-08 DIAGNOSIS — R0602 Shortness of breath: Secondary | ICD-10-CM

## 2013-01-08 DIAGNOSIS — I5032 Chronic diastolic (congestive) heart failure: Secondary | ICD-10-CM

## 2013-01-08 DIAGNOSIS — Z4502 Encounter for adjustment and management of automatic implantable cardiac defibrillator: Secondary | ICD-10-CM | POA: Diagnosis not present

## 2013-01-08 DIAGNOSIS — R0989 Other specified symptoms and signs involving the circulatory and respiratory systems: Secondary | ICD-10-CM

## 2013-01-08 DIAGNOSIS — R5381 Other malaise: Secondary | ICD-10-CM | POA: Diagnosis not present

## 2013-01-09 LAB — CBC WITH DIFFERENTIAL/PLATELET
Basophils Absolute: 0 10*3/uL (ref 0.0–0.1)
Eosinophils Relative: 2 % (ref 0–5)
Lymphocytes Relative: 30 % (ref 12–46)
Neutro Abs: 4.5 10*3/uL (ref 1.7–7.7)
Neutrophils Relative %: 58 % (ref 43–77)
Platelets: 240 10*3/uL (ref 150–400)
RDW: 13.5 % (ref 11.5–15.5)
WBC: 7.7 10*3/uL (ref 4.0–10.5)

## 2013-01-09 LAB — COMPREHENSIVE METABOLIC PANEL
AST: 26 U/L (ref 0–37)
Albumin: 4.2 g/dL (ref 3.5–5.2)
Alkaline Phosphatase: 74 U/L (ref 39–117)
BUN: 14 mg/dL (ref 6–23)
Potassium: 4.8 mEq/L (ref 3.5–5.3)
Sodium: 135 mEq/L (ref 135–145)

## 2013-01-10 ENCOUNTER — Telehealth: Payer: Self-pay | Admitting: Cardiovascular Disease

## 2013-01-10 NOTE — Telephone Encounter (Signed)
Brianna [name] is calling because she just receive her heart monitor and does not know when is she suppose to start wearing it or how long is she suppose to wear it .Marland Kitchen Please call.Marland Kitchen

## 2013-01-10 NOTE — Telephone Encounter (Signed)
Returned call.  Pt stated she just got the monitor and wanted to know how long she is supposed to wear it.  Pt stated she thinks Dr. Alanda Amass told her 2 weeks.  Pt informed her monitor will display when it's complete so she can turn it in.  Pt advised to call back w/ any questions and also directed to number on booklet in kit to call [employer] w/ any questions as well.  Pt verbalized understanding and agreed w/ plan.

## 2013-01-12 DIAGNOSIS — I441 Atrioventricular block, second degree: Secondary | ICD-10-CM | POA: Diagnosis not present

## 2013-01-14 ENCOUNTER — Ambulatory Visit
Admission: RE | Admit: 2013-01-14 | Discharge: 2013-01-14 | Disposition: A | Discharge status: Home / Self Care | Payer: Medicare Other | Source: Ambulatory Visit | Attending: Oncology | Admitting: Oncology

## 2013-01-14 DIAGNOSIS — M810 Age-related osteoporosis without current pathological fracture: Secondary | ICD-10-CM

## 2013-01-14 DIAGNOSIS — Z1231 Encounter for screening mammogram for malignant neoplasm of breast: Secondary | ICD-10-CM | POA: Diagnosis not present

## 2013-01-14 DIAGNOSIS — Z853 Personal history of malignant neoplasm of breast: Secondary | ICD-10-CM

## 2013-01-15 ENCOUNTER — Encounter: Payer: Self-pay | Admitting: Cardiovascular Disease

## 2013-01-16 ENCOUNTER — Other Ambulatory Visit (HOSPITAL_COMMUNITY): Payer: Self-pay | Admitting: Cardiovascular Disease

## 2013-01-16 ENCOUNTER — Ambulatory Visit (HOSPITAL_COMMUNITY)
Admission: RE | Admit: 2013-01-16 | Discharge: 2013-01-16 | Disposition: A | Discharge status: Home / Self Care | Payer: Medicare Other | Source: Ambulatory Visit | Attending: Internal Medicine | Admitting: Internal Medicine

## 2013-01-16 DIAGNOSIS — I079 Rheumatic tricuspid valve disease, unspecified: Secondary | ICD-10-CM | POA: Diagnosis not present

## 2013-01-16 DIAGNOSIS — R0602 Shortness of breath: Secondary | ICD-10-CM

## 2013-01-16 DIAGNOSIS — R0989 Other specified symptoms and signs involving the circulatory and respiratory systems: Secondary | ICD-10-CM

## 2013-01-16 DIAGNOSIS — I059 Rheumatic mitral valve disease, unspecified: Secondary | ICD-10-CM | POA: Diagnosis not present

## 2013-01-16 DIAGNOSIS — I509 Heart failure, unspecified: Secondary | ICD-10-CM | POA: Diagnosis not present

## 2013-01-16 DIAGNOSIS — R0609 Other forms of dyspnea: Secondary | ICD-10-CM | POA: Diagnosis not present

## 2013-01-16 NOTE — Progress Notes (Signed)
2D Echo Performed 01/16/2013    Tammera Engert, RCS  

## 2013-02-04 ENCOUNTER — Telehealth: Payer: Self-pay | Admitting: Cardiovascular Disease

## 2013-02-04 NOTE — Telephone Encounter (Signed)
Said she received a call from somebody here about her heart monitor or echo results!York Spaniel nobody left their name!

## 2013-02-11 ENCOUNTER — Other Ambulatory Visit (HOSPITAL_BASED_OUTPATIENT_CLINIC_OR_DEPARTMENT_OTHER): Payer: Medicare Other | Admitting: Lab

## 2013-02-11 DIAGNOSIS — Z853 Personal history of malignant neoplasm of breast: Secondary | ICD-10-CM | POA: Diagnosis not present

## 2013-02-11 DIAGNOSIS — M810 Age-related osteoporosis without current pathological fracture: Secondary | ICD-10-CM

## 2013-02-11 LAB — COMPREHENSIVE METABOLIC PANEL (CC13)
BUN: 13.9 mg/dL (ref 7.0–26.0)
CO2: 29 mEq/L (ref 22–29)
Calcium: 9.6 mg/dL (ref 8.4–10.4)
Chloride: 95 mEq/L — ABNORMAL LOW (ref 98–109)
Creatinine: 1.1 mg/dL (ref 0.6–1.1)
Glucose: 100 mg/dl (ref 70–140)

## 2013-02-11 LAB — CBC WITH DIFFERENTIAL/PLATELET
BASO%: 0.6 % (ref 0.0–2.0)
Basophils Absolute: 0 10*3/uL (ref 0.0–0.1)
Eosinophils Absolute: 0.1 10*3/uL (ref 0.0–0.5)
HCT: 34.8 % (ref 34.8–46.6)
LYMPH%: 22.2 % (ref 14.0–49.7)
MCHC: 34.5 g/dL (ref 31.5–36.0)
MONO#: 0.6 10*3/uL (ref 0.1–0.9)
NEUT%: 67.1 % (ref 38.4–76.8)
Platelets: 219 10*3/uL (ref 145–400)
WBC: 7.1 10*3/uL (ref 3.9–10.3)

## 2013-02-18 ENCOUNTER — Encounter: Payer: Self-pay | Admitting: Family

## 2013-02-18 ENCOUNTER — Ambulatory Visit: Payer: Medicare Other | Admitting: Oncology

## 2013-02-18 ENCOUNTER — Ambulatory Visit (HOSPITAL_BASED_OUTPATIENT_CLINIC_OR_DEPARTMENT_OTHER): Payer: Medicare Other | Admitting: Family

## 2013-02-18 VITALS — BP 119/71 | HR 75 | Temp 97.9°F | Resp 20 | Ht 62.5 in | Wt 127.9 lb

## 2013-02-18 DIAGNOSIS — Z853 Personal history of malignant neoplasm of breast: Secondary | ICD-10-CM | POA: Diagnosis not present

## 2013-02-18 DIAGNOSIS — C50912 Malignant neoplasm of unspecified site of left female breast: Secondary | ICD-10-CM

## 2013-02-18 DIAGNOSIS — M810 Age-related osteoporosis without current pathological fracture: Secondary | ICD-10-CM | POA: Diagnosis not present

## 2013-02-18 DIAGNOSIS — C50919 Malignant neoplasm of unspecified site of unspecified female breast: Secondary | ICD-10-CM

## 2013-02-18 MED ORDER — ANASTROZOLE 1 MG PO TABS: 1.0000 mg | ORAL_TABLET | Freq: Every day | ORAL | Status: DC

## 2013-02-18 NOTE — Progress Notes (Addendum)
[facility]  [phone] [phone]  OFFICE PROGRESS NOTE   ID: Brianna [name]   DOB: 09-Apr-1930  MR#: [id number]  NUU#:[id number]   PCP: Reva Bores, MD SU:   Kendrick Ranch, M.D. CARDIO: Susa Griffins, M.D.   HISTORY OF PRESENT ILLNESS: From Dr. Theron Arista Rubin's new patient evaluation note dated 01/28/2003: "Brianna [name] is a 79 year old woman living in [place]. This woman has been in relatively good health all of her life.  She saw her primary care doctor and was complaining of some shortness of breath.  There was a question of whether she had irregular heart rate.  She had a fairly extensive workup for this.  In addition, at the time of her primary physical examination she was found to have a mass in her breast.  Two months prior to that visit, she apparently had fallen off some steps and bruised her left breast in January.  She thought that this was a hematoma related to that.  At the time of her primary care doctor's visit in April, she was sent for a mammogram.   Mammogram was performed on Nov 27, 2002 and confirmed the presence on physical examination, of a mass, measuring about 3.0 cm and with an enlarged lymph node present in the left axilla.  Ultrasound also confirmed the presence of a mass.  Mammogram did show calcifications spread over an area of 5.0 cm.  Biopsy of both the mass and left axillary lymph node was performed on Nov 27, 2002, the results of which showed invasive ductal cancer.  Due to the size of the lesion, it was not felt that she was a good candidate for lumpectomy. She had undergone a cardiac catheterization by Dr. Allyson Sabal prior to that surgery and it turned out to be fine without any abnormalities.  The patient underwent mastectomy and lymph node dissection on December 30, 2002.  Final pathology showed a 2.2 cm grade 3 of 3 invasive ductal cancer.  Lymphovascular invasion was seen.   A total of 12 lymph nodes were identified, 11 of which had  evidence of metastatic disease.  The tumor was ER/PR positive, proliferative index of 50%.  HER-2/neu was negative.   DNA index was 1.  Brianna [name] has had a course complicated by postoperative wound infection, requiring several hospitalizations and IV antibiotics. She has since been discharged from the hospital and is doing well."  Her subsequent history is as detailed below.    INTERVAL HISTORY: Dr. Darnelle Catalan and I saw Brianna [name] today for followup of invasive ductal carcinoma of the left breast.  The patient was last seen by Dr. Donnie Coffin on 02/14/2012.  Since her last office visit, the patient has been doing relatively well.  She is establishing herself with Dr. Darrall Dears service today.   REVIEW OF SYSTEMS: A 10 point review of systems was completed and is negative except left axilla area discomfort that patient states is relieved by Aleve.  This pain started 4-5 weeks ago and patient does not recall any recent trauma or muscle strain.  She states that she had hematuria in 2013 and was seen by [facility].  Hematuria has subsequently resolved.  She states she has daily fatigue, but states "I feel good when I wake up."  Brianna [name] denies any other symptomatology.   PAST MEDICAL HISTORY: Past Medical History  Diagnosis Date  . Emphysema of lung   . Cancer     breast  . Depression   . CHF (congestive  heart failure)   . Hypertension   . Hyperlipidemia   . Osteopenia     PAST SURGICAL HISTORY: Past Surgical History  Procedure Laterality Date  . Breast surgery      mastectomy  . Appendectomy  1943  . Tonsillectomy  1940  . Cardiac defibrillator placement  Dec 11, 2004    FAMILY HISTORY Family History  Problem Relation Age of Onset  . Heart disease Mother   . Heart disease Father   . Hyperlipidemia Sister   . Heart disease Brother   . Cancer Daughter     Oral cancer and "knot" on right cheek that was cancer  Two sisters and one brother the brother has cardiac problems.  She  has a twin sister.   GYNECOLOGIC HISTORY: Menarche and menopause are unknown dates.  No history of recent mammograms prior to this current one.  No history of hormone replacement therapy.  She is gravida 2, para 2.  No history of breast feeding.    SOCIAL HISTORY: Brianna [name] smoked cigarettes until about age 4.  She lives in [place] by herself and became a widow in Dec 11, 2000.  Her husband died  from pancreatic cancer.  This was her second marriage, of 15 years.  Her first marriage was about 25 years. She has two children.  Her son lives in [place], [place] and her daughter lives near [place].  She has 2 grandsons. She also has a brother in [place].  In her spare time she enjoys watching movies, traveling, and going for long automobile drives.    ADVANCED DIRECTIVES: Not on file  HEALTH MAINTENANCE: History  Substance Use Topics  . Smoking status: Former Smoker    Types: Cigarettes  . Smokeless tobacco: Never Used     Comment: Quit smoking in 1977/12/11  . Alcohol Use: 3.5 oz/week    7 drink(s) per week     Comment: 4 oz of wine daily    Colonoscopy: Not on file PAP: 01/21/2005 Bone density:  The patient's last bone density scan on 12/25/2011 showed a T score of -1.9 (osteopenia). Lipid panel: Not on file  No Known Allergies  Current Outpatient Prescriptions  Medication Sig Dispense Refill  . albuterol (PROAIR HFA) 108 (90 BASE) MCG/ACT inhaler Inhale 2 puffs into the lungs 4 (four) times daily as needed.        Marland Kitchen anastrozole (ARIMIDEX) 1 MG tablet Take 1 tablet (1 mg total) by mouth daily.  90 tablet  5  . aspirin 81 MG EC tablet Take 81 mg by mouth daily.        . Calcium Carbonate-Vitamin D (CALTRATE 600+D) 600-400 MG-UNIT per tablet Take 1 tablet by mouth daily.       . carvedilol (COREG) 25 MG tablet Take 25 mg by mouth 2 (two) times daily with a meal.       . enalapril (VASOTEC) 2.5 MG tablet Take 5 mg by mouth daily.       . furosemide (LASIX) 20 MG tablet Take 20  mg by mouth daily.        . Multiple Vitamin (MULTI-VITAMIN DAILY PO) Take 1 tablet by mouth.      . Fluticasone-Salmeterol (ADVAIR DISKUS) 100-50 MCG/DOSE AEPB Inhale 1 puff into the lungs 2 (two) times daily.  1 each  10   No current facility-administered medications for this visit.    OBJECTIVE: Filed Vitals:   02/18/13 1425  BP: 119/71  Pulse: 75  Temp: 97.9 F (36.6 C)  Resp:  20     Body mass index is 23.01 kg/(m^2).      ECOG FS: 1 - Symptomatic but completely ambulatory  General appearance: Alert, cooperative, thin frame no apparent distress Head: Normocephalic, without obvious abnormality, atraumatic Eyes: Arcus senilis, PERRLA, EOMI, left eye amblyopia Nose: Nares, septum and mucosa are normal, no drainage or sinus tenderness Neck: No adenopathy, supple, symmetrical, trachea midline, no tenderness Resp: Clear to auscultation bilaterally Cardio: Regular rate and rhythm, S1, S2 normal, no murmur, click, rub or gallop, right chest implanted ICD Breasts: Left breast is surgically absent, left breast has well-healed surgical scars, right breast does not have any nipple inversion, no axilla fullness bilaterally GI: Soft, distended, non-tender, hypoactive bowel sounds, no organomegaly Extremities: Extremities normal, atraumatic, no cyanosis or edema, bilateral lower extremity varicose veins, left lower extremity skin erythema (appears to be an insect bite), left upper extremity limited range of motion Lymph nodes: Cervical, supraclavicular, and axillary nodes normal Neurologic: Grossly normal    LAB RESULTS: Lab Results  Component Value Date   WBC 7.1 02/11/2013   NEUTROABS 4.8 02/11/2013   HGB 12.0 02/11/2013   HCT 34.8 02/11/2013   MCV 90.8 02/11/2013   PLT 219 02/11/2013      Chemistry      Component Value Date/Time   NA 133* 02/11/2013 1055   NA 135 01/08/2013 1432   K 4.1 02/11/2013 1055   K 4.8 01/08/2013 1432   CL 96 01/08/2013 1432   CO2 29 02/11/2013 1055   CO2 32  01/08/2013 1432   BUN 13.9 02/11/2013 1055   BUN 14 01/08/2013 1432   CREATININE 1.1 02/11/2013 1055   CREATININE 1.11* 01/08/2013 1432   CREATININE 0.97 02/07/2012 1339      Component Value Date/Time   CALCIUM 9.6 02/11/2013 1055   CALCIUM 9.6 01/08/2013 1432   ALKPHOS 77 02/11/2013 1055   ALKPHOS 74 01/08/2013 1432   AST 25 02/11/2013 1055   AST 26 01/08/2013 1432   ALT 13 02/11/2013 1055   ALT 12 01/08/2013 1432   BILITOT 0.52 02/11/2013 1055   BILITOT 0.4 01/08/2013 1432      Lab Results  Component Value Date   LABCA2 24 02/07/2012    Urinalysis    Component Value Date/Time   COLORURINE YELLOW 06/15/2010 2135   APPEARANCEUR CLOUDY* 06/15/2010 2135   LABSPEC 1.006 06/15/2010 2135   PHURINE 7.0 06/15/2010 2135   GLUCOSEU NEGATIVE 06/15/2010 2135   HGBUR NEGATIVE 06/15/2010 2135   BILIRUBINUR NEG 10/26/2010 1435   BILIRUBINUR NEGATIVE 06/15/2010 2135   KETONESUR NEGATIVE 06/15/2010 2135   PROTEINUR NEGATIVE 06/15/2010 2135   UROBILINOGEN 0.2 10/26/2010 1435   UROBILINOGEN 0.2 06/15/2010 2135   NITRITE NEG 10/26/2010 1435   NITRITE NEGATIVE 06/15/2010 2135   LEUKOCYTESUR LARGE 10/26/2010 1435    STUDIES: 1.  The patient's last screening unilateral right digital mammogram on 01/14/2013 showed the patient had a left mastectomy.  No suspicious masses, architectural distortion, or calcifications are present.  No evidence of malignancy.  2.  The patient's last bone density scan on 12/25/2011 showed a T score of -1.9 (osteopenia).    ASSESSMENT: Brianna [name] is a 79 y.o. [place] woman: 1.  Status post left breast needle core biopsy with left axillary needle core biopsy on 11/27/2002 which showed left breast invasive ductal carcinoma, estrogen receptor 97% positive, progesterone receptor 85% positive, Ki-67 50%, HER-2/neu by immunocytochemical stain negative, and left axillary lymph node showed metastatic ductal carcinoma.  2.  Status post left breast radical mastectomy  with left axillary lymph node resection on 12/30/2002 for a stage IIIB, pT2, pN3, pMX, 3.2 cm poorly differentiated infiltrating ductal carcinoma grade 3 with metastases to 11 of 12 axillary lymph nodes, prognostic panel not repeated.  3.  Status post adjuvant chemotherapy with dose dense Adriamycin/Cytoxan x 4 cycles from 02/16/2003 through 04/01/2003 followed by chemotherapy with Taxol weekly x 12 weeks from 04/15/2003 through 07/01/2003.  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  Ejection fraction has ranged from 50-55% in 2007 to current ejection fraction from 2-D echocardiogram in 12/2012 showing 45% to 50%.  Cardiomyopathy is being followed by Dr. Alanda Amass, Cardiologist.  4.  The patient did not receive radiation therapy.  5.  The patient started antiestrogen therapy with Arimidex in 06/2003.  6.  Fatigue  7.  Left upper extremity limited range of motion  8.  Elevated and vitamin D level  9.  Osteopenia   PLAN: Dr. Darnelle Catalan and Brianna [name] discussed her continuing antiestrogen therapy with Anastrozole indefinitely.  An electronic prescription for Anastrozole 1 mg by mouth daily #90 with 5 refills was sent to the patient's pharmacy.  We will schedule Brianna [name] for her annual digital screening unilateral right mammogram and bone density scan for 12/2013.  We will also schedule Brianna [name] for annual Prolia injections starting in 2014 and she will receive her next injection during her office visit next year.  Brianna [name] has been referred to physical therapy for ongoing right upper extremity limited range of motion.  Brianna [name] was encouraged to exercise daily which may also help improve her fatigue.  Ms. Ghazi' vitamin D level was elevated and she was asked to stop taking Caltrate with vitamin D and a multivitamin.  She was encouraged to just take her multivitamin daily providing that it contains 1000 IUs of vitamin D3 and 600 mg of calcium.  She agreed  to do so.  We plan to see Brianna [name] in one year at which time we will check CBC, CMP, vitamin D level, and LDH.  Brianna [name] will be due for her annual Prolia injection at that time.  All questions were answered.  The patient was encouraged to contact us in the interim with any problems, questions or concerns.   Larina Bras, NP-C 02/19/2013, 5:55 PM  ADDENDUM: This 79 year old [place] woman establish herself in my practice today. We reviewed her diagnosis, treatment history, and prognosis. In brief:  She underwent left modified radical mastectomy in June of 2004 for a pT2 pN3, stage IIIB invasive ductal carcinoma, grade 3, estrogen receptor 97% positive, progesterone receptor 85% positive, with an elevated MIB-1-1 of 50%, but no HER-2 amplification. Adjuvantly she received dose dense doxorubicin and cyclophosphamide x4 followed by weekly paclitaxel x12, all chemotherapy completed in December of 2004.   As per a Dr. Renelda Loma note of 02/14/2012, the patient next received postmastectomy radiation to the chest wall. She was started on anastrozole. December of 2004.  Brianna [name] understands we do not have much evidence to continue aromatase inhibitors beyond 5 years. Furthermore she has significant osteoporosis problems, which are probably being made worse by this medication. Nevertheless she is adamant that she wishes to continue on this medication indefinitely. Of course, "absence of data is not data of absence", and I do not know for a fact that continuing anastrozole is harmful or is not beneficial in terms of further breast cancer risk reduction  Accordingly the plan will be to continue anastrozole  indefinitely, but also start the patient on bisphosphonates. We discussed the various options, as well as the possible toxicities, side effects and complications of the various agents. We're going to start Brianna [name] on [place] , which she will receive on a yearly basis so long as she  continues on anastrozole. She has a good understanding of this plan is very much in agreement with that.  I personally saw this patient and performed a substantive portion of this encounter with the listed APP documented above.   Lowella Dell, MD

## 2013-02-18 NOTE — Patient Instructions (Addendum)
Please contact us at [phone] if you have any questions or concerns.  Please continue to do well and enjoy life!!!  Get plenty of rest, drink plenty of water, exercise daily (walking), eat a balanced diet.  Continue to take multivitamin daily (1000 IU D3 and 600 mg of calcium)  - Stop taking Caltrate - Vitamin D level is too high  Results for orders placed in visit on 02/11/13 (from the past 336 hour(s))  CBC WITH DIFFERENTIAL   Collection Time    02/11/13 10:55 AM      Result Value Range   WBC 7.1  3.9 - 10.3 10e3/uL   NEUT# 4.8  1.5 - 6.5 10e3/uL   HGB 12.0  11.6 - 15.9 g/dL   HCT 14.7  82.9 - 56.2 %   Platelets 219  145 - 400 10e3/uL   MCV 90.8  79.5 - 101.0 fL   MCH 31.3  25.1 - 34.0 pg   MCHC 34.5  31.5 - 36.0 g/dL   RBC 1.30  8.65 - 7.84 10e6/uL   RDW 13.5  11.2 - 14.5 %   lymph# 1.6  0.9 - 3.3 10e3/uL   MONO# 0.6  0.1 - 0.9 10e3/uL   Eosinophils Absolute 0.1  0.0 - 0.5 10e3/uL   Basophils Absolute 0.0  0.0 - 0.1 10e3/uL   NEUT% 67.1  38.4 - 76.8 %   LYMPH% 22.2  14.0 - 49.7 %   MONO% 8.0  0.0 - 14.0 %   EOS% 2.1  0.0 - 7.0 %   BASO% 0.6  0.0 - 2.0 %  COMPREHENSIVE METABOLIC PANEL (CC13)   Collection Time    02/11/13 10:55 AM      Result Value Range   Sodium 133 (*) 136 - 145 mEq/L   Potassium 4.1  3.5 - 5.1 mEq/L   Chloride 95 (*) 98 - 109 mEq/L   CO2 29  22 - 29 mEq/L   Glucose 100  70 - 140 mg/dl   BUN 69.6  7.0 - 29.5 mg/dL   Creatinine 1.1  0.6 - 1.1 mg/dL   Total Bilirubin 2.84  0.20 - 1.20 mg/dL   Alkaline Phosphatase 77  40 - 150 U/L   AST 25  5 - 34 U/L   ALT 13  0 - 55 U/L   Total Protein 7.5  6.4 - 8.3 g/dL   Albumin 3.8  3.5 - 5.0 g/dL   Calcium 9.6  8.4 - 13.2 mg/dL  VITAMIN D 25 HYDROXY   Collection Time    02/11/13 10:55 AM      Result Value Range   Vit D, 25-Hydroxy 97 (*) 30 - 89 ng/mL

## 2013-02-20 ENCOUNTER — Ambulatory Visit: Payer: Medicare Other | Attending: Family | Admitting: Physical Therapy

## 2013-02-20 ENCOUNTER — Other Ambulatory Visit: Payer: Self-pay | Admitting: Family

## 2013-02-20 DIAGNOSIS — Z853 Personal history of malignant neoplasm of breast: Secondary | ICD-10-CM

## 2013-02-20 DIAGNOSIS — M25519 Pain in unspecified shoulder: Secondary | ICD-10-CM | POA: Diagnosis not present

## 2013-02-20 DIAGNOSIS — M545 Low back pain, unspecified: Secondary | ICD-10-CM | POA: Diagnosis not present

## 2013-02-20 DIAGNOSIS — IMO0001 Reserved for inherently not codable concepts without codable children: Secondary | ICD-10-CM | POA: Diagnosis not present

## 2013-02-20 DIAGNOSIS — M79602 Pain in left arm: Secondary | ICD-10-CM

## 2013-02-20 DIAGNOSIS — M810 Age-related osteoporosis without current pathological fracture: Secondary | ICD-10-CM

## 2013-02-21 ENCOUNTER — Telehealth: Payer: Self-pay | Admitting: Cardiovascular Disease

## 2013-02-21 ENCOUNTER — Ambulatory Visit (HOSPITAL_COMMUNITY)
Admission: RE | Admit: 2013-02-21 | Discharge: 2013-02-21 | Disposition: A | Discharge status: Home / Self Care | Payer: Medicare Other | Source: Ambulatory Visit | Attending: Family | Admitting: Family

## 2013-02-21 ENCOUNTER — Telehealth: Payer: Self-pay | Admitting: Oncology

## 2013-02-21 ENCOUNTER — Telehealth: Payer: Self-pay | Admitting: Family

## 2013-02-21 DIAGNOSIS — Z853 Personal history of malignant neoplasm of breast: Secondary | ICD-10-CM

## 2013-02-21 DIAGNOSIS — M810 Age-related osteoporosis without current pathological fracture: Secondary | ICD-10-CM

## 2013-02-21 DIAGNOSIS — M79609 Pain in unspecified limb: Secondary | ICD-10-CM | POA: Insufficient documentation

## 2013-02-21 DIAGNOSIS — M79602 Pain in left arm: Secondary | ICD-10-CM

## 2013-02-21 MED ORDER — CARVEDILOL 25 MG PO TABS: 25.0000 mg | ORAL_TABLET | Freq: Two times a day (BID) | ORAL | Status: DC

## 2013-02-21 NOTE — Telephone Encounter (Signed)
Refills sent to pharmacy. 

## 2013-02-21 NOTE — Telephone Encounter (Signed)
Discussed findings of left humerus x-ray with Brianna [name]-no acute abnormality identified.  She stated understanding.  Patient may proceed with physical therapy for limited range of motion of left upper extremity.

## 2013-02-21 NOTE — Telephone Encounter (Signed)
Brianna [name] needs refill on Car[place] 39m 1 day---90 day supply  [facility]  [address]

## 2013-02-24 ENCOUNTER — Ambulatory Visit: Payer: Medicare Other | Attending: Family | Admitting: Physical Therapy

## 2013-02-24 DIAGNOSIS — Z853 Personal history of malignant neoplasm of breast: Secondary | ICD-10-CM | POA: Diagnosis not present

## 2013-02-24 DIAGNOSIS — M25519 Pain in unspecified shoulder: Secondary | ICD-10-CM | POA: Diagnosis not present

## 2013-02-24 DIAGNOSIS — M545 Low back pain, unspecified: Secondary | ICD-10-CM | POA: Diagnosis not present

## 2013-02-24 DIAGNOSIS — IMO0001 Reserved for inherently not codable concepts without codable children: Secondary | ICD-10-CM | POA: Diagnosis not present

## 2013-02-26 ENCOUNTER — Ambulatory Visit: Payer: Medicare Other

## 2013-03-04 ENCOUNTER — Ambulatory Visit: Payer: Medicare Other | Admitting: Physical Therapy

## 2013-03-05 ENCOUNTER — Ambulatory Visit: Payer: Medicare Other

## 2013-03-10 ENCOUNTER — Ambulatory Visit: Payer: Medicare Other | Admitting: Physical Therapy

## 2013-03-12 ENCOUNTER — Ambulatory Visit: Payer: Medicare Other

## 2013-03-17 ENCOUNTER — Ambulatory Visit: Payer: Medicare Other | Admitting: Physical Therapy

## 2013-03-19 ENCOUNTER — Ambulatory Visit: Payer: Medicare Other | Admitting: Physical Therapy

## 2013-03-27 ENCOUNTER — Other Ambulatory Visit: Payer: Self-pay | Admitting: Cardiovascular Disease

## 2013-03-27 ENCOUNTER — Other Ambulatory Visit: Payer: Self-pay | Admitting: Family Medicine

## 2013-03-27 DIAGNOSIS — E039 Hypothyroidism, unspecified: Secondary | ICD-10-CM | POA: Diagnosis not present

## 2013-03-27 DIAGNOSIS — I509 Heart failure, unspecified: Secondary | ICD-10-CM | POA: Diagnosis not present

## 2013-03-27 DIAGNOSIS — Z95810 Presence of automatic (implantable) cardiac defibrillator: Secondary | ICD-10-CM | POA: Diagnosis not present

## 2013-03-27 DIAGNOSIS — R5381 Other malaise: Secondary | ICD-10-CM | POA: Diagnosis not present

## 2013-03-27 DIAGNOSIS — Z4502 Encounter for adjustment and management of automatic implantable cardiac defibrillator: Secondary | ICD-10-CM | POA: Diagnosis not present

## 2013-03-27 LAB — T3, FREE: T3, Free: 3.2 pg/mL (ref 2.3–4.2)

## 2013-03-27 LAB — T4, FREE: Free T4: 1.17 ng/dL (ref 0.80–1.80)

## 2013-03-27 LAB — PACEMAKER DEVICE OBSERVATION

## 2013-04-08 ENCOUNTER — Encounter: Payer: Self-pay | Admitting: Home Health Services

## 2013-04-16 ENCOUNTER — Ambulatory Visit (INDEPENDENT_AMBULATORY_CARE_PROVIDER_SITE_OTHER): Payer: Medicare Other | Admitting: Cardiovascular Disease

## 2013-04-16 ENCOUNTER — Encounter: Payer: Self-pay | Admitting: Cardiovascular Disease

## 2013-04-16 VITALS — BP 158/90 | HR 72 | Ht 63.5 in | Wt 126.6 lb

## 2013-04-16 DIAGNOSIS — I10 Essential (primary) hypertension: Secondary | ICD-10-CM

## 2013-04-16 DIAGNOSIS — Z95810 Presence of automatic (implantable) cardiac defibrillator: Secondary | ICD-10-CM | POA: Diagnosis not present

## 2013-04-16 DIAGNOSIS — E7800 Pure hypercholesterolemia, unspecified: Secondary | ICD-10-CM

## 2013-04-16 NOTE — Progress Notes (Signed)
04/16/2013 Brianna [name]   04-Feb-1930  [id number]  Primary Physician Reva Bores, MD Primary Cardiologist: Runell Gess MD Brianna [name]   HPI:  The patient is a very pleasant 79 year old thin appearing widowed Caucasian female mother of 2, grandmother to 2 grandchildren who has a history of nonischemic cardiomyopathy related to chemotherapy for breast cancer 10 years ago. She had an ICD placed for primary prevention by Dr. Lilla Shook in March of 2006 which has been followed by Dr. Alanda Amass since. Her most recent 2D echo performed in our office on February 27, 2012 revealed an EF of 45-50% with mild global hypokinesia and septal wall motion abnormality consistent with a conduction abnormality. She otherwise is asymptomatic. Dr. Alanda Amass follows her ICD .     Current Outpatient Prescriptions  Medication Sig Dispense Refill  . ADVAIR DISKUS 100-50 MCG/DOSE AEPB INHALE 1 PUFF INTO THE LUNGS 2 (TWO) TIMES DAILY.  60 each  4  . anastrozole (ARIMIDEX) 1 MG tablet Take 1 tablet (1 mg total) by mouth daily.  90 tablet  5  . aspirin 81 MG EC tablet Take 81 mg by mouth daily.        Marland Kitchen atorvastatin (LIPITOR) 40 MG tablet Take 40 mg by mouth every other day.       . Calcium Carbonate-Vitamin D (CALTRATE 600+D) 600-400 MG-UNIT per tablet Take 1 tablet by mouth daily.       . carvedilol (COREG) 25 MG tablet Take 1 tablet (25 mg total) by mouth 2 (two) times daily with a meal.  180 tablet  2  . enalapril (VASOTEC) 2.5 MG tablet Take 5 mg by mouth daily.       . furosemide (LASIX) 20 MG tablet Take 20 mg by mouth daily.        . Multiple Vitamin (MULTI-VITAMIN DAILY PO) Take 1 tablet by mouth.       No current facility-administered medications for this visit.    Allergies  Allergen Reactions  . Crestor [Rosuvastatin]     Myalgias. Pt is tolerating Lipitor 20mg  QOD well.    History   Social History  . Marital Status: Widowed    Spouse Name: N/A    Number of Children: 2  .  Years of Education: 11   Occupational History  . Retired-Manager Cytogeneticist.    Social History Main Topics  . Smoking status: Former Smoker -- 0.50 packs/day for 4 years    Types: Cigarettes    Quit date: 07/24/1977  . Smokeless tobacco: Never Used  . Alcohol Use: No  . Drug Use: No  . Sexual Activity: No   Other Topics Concern  . Not on file   Social History Narrative   Health Care POA:    Emergency Contact: friend, Brianna [name], [phone]   End of Life Plan:    Who lives with you: self   Any pets: none   Diet: Patient has a varied diet and controls portions well.   Exercise: Patient does not have any regular exercise routine.   Seatbelts: Patient reports wearing seatbelt when in vehicle.   Wynelle Link Exposure/Protection: Patient reports wearing sun screening daily.   Hobbies: clothes alterations              Review of Systems: General: negative for chills, fever, night sweats or weight changes.  Cardiovascular: negative for chest pain, dyspnea on exertion, edema, orthopnea, palpitations, paroxysmal nocturnal dyspnea or shortness of breath Dermatological: negative for rash Respiratory: negative for cough or  wheezing Urologic: negative for hematuria Abdominal: negative for nausea, vomiting, diarrhea, bright red blood per rectum, melena, or hematemesis Neurologic: negative for visual changes, syncope, or dizziness All other systems reviewed and are otherwise negative except as noted above.    Blood pressure 158/90, pulse 72, height 5' 3.5" (1.613 m), weight 126 lb 9.6 oz (57.425 kg).  General appearance: alert and no distress Neck: no adenopathy, no carotid bruit, no JVD, supple, symmetrical, trachea midline and thyroid not enlarged, symmetric, no tenderness/mass/nodules Lungs: clear to auscultation bilaterally Heart: regular rate and rhythm, S1, S2 normal, no murmur, click, rub or gallop Extremities: extremities normal, atraumatic, no cyanosis or edema  EKG not performed  today  ASSESSMENT AND PLAN:   HYPERTENSION, BENIGN SYSTEMIC Well-controlled on current medications  HYPERCHOLESTEROLEMIA On statin therapy followed by her PCP  Automatic implantable cardiac defibrillator in situ Implanted in 2006 by Dr. Launa Grill for primary prevention in setting of nonischemic cardiomyopathy. Followed by Dr. Susa Griffins in her office. I am going to transition her to be seen by Dr. Royann Shivers       Runell Gess MD [facility], [facility] 04/16/2013 11:54 AM

## 2013-04-16 NOTE — Assessment & Plan Note (Signed)
Well-controlled on current medications 

## 2013-04-16 NOTE — Patient Instructions (Addendum)
Your physician wants you to follow-up in: 1 year with Dr Berry. You will receive a reminder letter in the mail two months in advance. If you don't receive a letter, please call our office to schedule the follow-up appointment.  

## 2013-04-16 NOTE — Assessment & Plan Note (Signed)
Implanted in 2006 by Dr. Launa Grill for primary prevention in setting of nonischemic cardiomyopathy. Followed by Dr. Susa Griffins in her office. I am going to transition her to be seen by Dr. Royann Shivers

## 2013-04-16 NOTE — Assessment & Plan Note (Signed)
On statin therapy followed by her PCP 

## 2013-04-18 ENCOUNTER — Encounter: Payer: Self-pay | Admitting: Cardiovascular Disease

## 2013-04-18 ENCOUNTER — Ambulatory Visit: Payer: Medicare Other | Admitting: Cardiovascular Disease

## 2013-04-23 ENCOUNTER — Encounter: Payer: Self-pay | Admitting: Home Health Services

## 2013-04-23 ENCOUNTER — Encounter: Payer: Self-pay | Admitting: Cardiovascular Disease

## 2013-04-23 ENCOUNTER — Ambulatory Visit (INDEPENDENT_AMBULATORY_CARE_PROVIDER_SITE_OTHER): Payer: Medicare Other | Admitting: Home Health Services

## 2013-04-23 VITALS — BP 142/77 | HR 63 | Temp 97.2°F | Ht 62.5 in | Wt 125.0 lb

## 2013-04-23 DIAGNOSIS — Z0000 Encounter for general adult medical examination without abnormal findings: Secondary | ICD-10-CM

## 2013-04-23 NOTE — Progress Notes (Signed)
Patient here for annual wellness visit, patient reports: Risk Factors/Conditions needing evaluation or treatment: Pt does not have any new risk factors that need evaluation. Home Safety: Pt lives by self in 1 story home.  Pt reports having smoke detectors and adaptive equipment in bathroom.  Pt wears a medical alert button daily.  Other Information: Corrective lens: Pt wears daily corrective lens.  Does not remember last eye exam. Dentures: Pt has both upper and lower partials Memory: Pt reports some memory loss. Patient's Mini Mental Score (recorded in doc. flowsheet): 30 Pt reports no hearing or vision problems.  Balance/Gait: Pt reports falling several times while working outside.  Pt reports not injuries.  Pt has visible imbalance in pelvic girdle with left hip higher then right. We talked about daily PT exercises and how to do some simple hip strengthening and balance exercises.  Balance Abnormal Patient value  Sitting balance    Sit to stand x Uses arms  Attempts to arise    Immediate standing balance x imbalanced  Standing balance    Nudge    Eyes closed- Romberg    Tandem stance x unable  Back lean    Neck Rotation    360 degree turn    Sitting down x Uses arms   Gait Abnormal Patient value  Initiation of gait    Step length-left    Step length-right    Step height-left    Step height-right    Step symmetry    Step continuity    Path deviation    Trunk movement x   Walking stance x Bent over      [employer] Visit Requirements Recorded Today In  Medical, family, social history Past Medical, Family, Social History Section  Current providers Care team  Current medications Medications  Wt, BP, Ht, BMI Vital signs  Tobacco, alcohol, illicit drug use History  ADL Nurse Assessment  Depression Screening Nurse Assessment  Cognitive impairment Nurse Assessment  Mini Mental Status Document Flowsheet  Fall Risk Fall/Depression  Home Safety Progress Note  End of  Life Planning (welcome visit) Social Documentation  Medicare preventative services Progress Note  Risk factors/conditions needing evaluation/treatment Progress Note  Personalized health advice Patient Instructions, goals, letter  Diet & Exercise Social Documentation  Emergency Contact Social Documentation  Seat Belts Social Documentation  Sun exposure/protection Social Documentation

## 2013-04-23 NOTE — Progress Notes (Signed)
Patient ID: Brianna [name], female   DOB: [birth year], 79 y.o.   MRN: [id number] I have reviewed this visit and discussed with Arlys John and agree with her documentation.

## 2013-05-02 ENCOUNTER — Telehealth: Payer: Self-pay | Admitting: *Deleted

## 2013-05-02 NOTE — Telephone Encounter (Signed)
sw pt informed her that Brianna [name] will be on pal 01/19/14. gv appt for 01/19/14 labs@ 9:15am, ov/ Amy @ 9:45am, and inj@ 10:30am. Pt is aware that i will mail her a cal per her request...td

## 2013-07-15 ENCOUNTER — Telehealth: Payer: Self-pay | Admitting: Cardiovascular Disease

## 2013-07-15 NOTE — Telephone Encounter (Signed)
Per Dr. Royann Shivers, forward to Device Pool at [facility] location since [place], [place] is out of the office today.    Message forwarded to the Device Pool at the [facility] location.

## 2013-07-15 NOTE — Telephone Encounter (Signed)
Returned call and pt verified x 2.  Pt informed message received and RN asked pt if she was shocked or if transmitter alarmed.  Pt denied being shocked and stated the box went off while she was in another room.  Stated she went in and turned it off.  Pt informed Levora Angel will be notified as she works w/ the devices in our clinic.  Pt verbalized understanding and agreed w/ plan.  Message forwarded to [place], CMA r/t device/monitor concerns.

## 2013-07-15 NOTE — Telephone Encounter (Signed)
Please call-her defibrillator box went off last night.

## 2013-07-15 NOTE — Telephone Encounter (Signed)
Medtronic Rep in office today and advised pt do remote transmission now so she can look at it to let pt know if she needs to be seen or not.  Call to pt and informed.  Pt agreed to perform transmission now.  Message forwarded to Dr. Royann Shivers [facility]).  Paper chart# [id number] on cart.

## 2013-07-16 NOTE — Telephone Encounter (Signed)
Pt called and said somebody called her this morning.

## 2013-07-18 NOTE — Telephone Encounter (Signed)
Patient scheduled for office visit 07/28/13.

## 2013-07-28 ENCOUNTER — Ambulatory Visit (INDEPENDENT_AMBULATORY_CARE_PROVIDER_SITE_OTHER): Payer: Medicare Other | Admitting: Cardiovascular Disease

## 2013-07-28 ENCOUNTER — Encounter: Payer: Self-pay | Admitting: Cardiovascular Disease

## 2013-07-28 VITALS — BP 152/84 | HR 72 | Ht 63.0 in | Wt 126.7 lb

## 2013-07-28 DIAGNOSIS — I472 Ventricular tachycardia: Secondary | ICD-10-CM | POA: Diagnosis not present

## 2013-07-28 DIAGNOSIS — E7800 Pure hypercholesterolemia, unspecified: Secondary | ICD-10-CM

## 2013-07-28 DIAGNOSIS — I428 Other cardiomyopathies: Secondary | ICD-10-CM

## 2013-07-28 DIAGNOSIS — R0609 Other forms of dyspnea: Secondary | ICD-10-CM

## 2013-07-28 DIAGNOSIS — I5032 Chronic diastolic (congestive) heart failure: Secondary | ICD-10-CM

## 2013-07-28 DIAGNOSIS — Z95810 Presence of automatic (implantable) cardiac defibrillator: Secondary | ICD-10-CM

## 2013-07-28 DIAGNOSIS — I4729 Other ventricular tachycardia: Secondary | ICD-10-CM

## 2013-07-28 DIAGNOSIS — R0600 Dyspnea, unspecified: Secondary | ICD-10-CM

## 2013-07-28 DIAGNOSIS — R0989 Other specified symptoms and signs involving the circulatory and respiratory systems: Secondary | ICD-10-CM

## 2013-07-28 LAB — MDC_IDC_ENUM_SESS_TYPE_INCLINIC
HIGH POWER IMPEDANCE MEASURED VALUE: 78 Ohm
Lead Channel Impedance Value: 456 Ohm
Lead Channel Pacing Threshold Amplitude: 2 V
Lead Channel Pacing Threshold Pulse Width: 0.4 ms
Lead Channel Setting Sensing Sensitivity: 0.45 mV
MDC IDC MSMT BATTERY VOLTAGE: 3.08 V
MDC IDC MSMT LEADCHNL RV SENSING INTR AMPL: 7.5 mV
MDC IDC SET LEADCHNL RV PACING AMPLITUDE: 2 V
MDC IDC SET LEADCHNL RV PACING PULSEWIDTH: 0.4 ms
MDC IDC SET ZONE DETECTION INTERVAL: 300 ms
MDC IDC STAT BRADY RV PERCENT PACED: 0.1 % — AB
Zone Setting Detection Interval: 428.5 ms

## 2013-07-28 LAB — PACEMAKER DEVICE OBSERVATION

## 2013-07-28 MED ORDER — ALBUTEROL SULFATE HFA 108 (90 BASE) MCG/ACT IN AERS: 2.0000 | INHALATION_SPRAY | Freq: Four times a day (QID) | RESPIRATORY_TRACT | Status: DC | PRN

## 2013-07-28 NOTE — Patient Instructions (Addendum)
Remote monitoring is used to monitor your ICD from home. This monitoring reduces the number of office visits required to check your device to one time per year. It allows [place] to keep an eye on the functioning of your device to ensure it is working properly. You are scheduled for a device check from home on 10-29-2013. You may send your transmission at any time that day. If you have a wireless device, the transmission will be sent automatically. After your physician reviews your transmission, you will receive a postcard with your next transmission date.  Your physician recommends that you schedule a follow-up appointment in: 12 months

## 2013-07-28 NOTE — Progress Notes (Signed)
Patient ID: PATRIA WARZECHA, female   DOB: Aug 23, 1929, 80 y.o.   MRN: [id number]     Reason for office visit ICD followup, nonischemic cardiomyopathy  Mrs. Bragdon is a former patient of Dr. Terance Ice recently retired. She has a history of breast cancer status post left mastectomy and chemotherapy and has mild nonischemic cardiomyopathy. Most recently (12/2012)her LVEF is 45-50% by echo, but in years past her ejection fraction was around 30% and she received a single-chamber defibrillator in 2006. Her initial defibrillator lead was a Patent attorney with a lead fracture that led to placement with a St. Jude Durata 7122 lead and implantation of a new Medtronic generator in 2011. Has not had any device therapy since that procedure and does not require ventricular pacing. Past has had little if any signs of congestive heart failure but over the last several months she feels more short of breath. She denies wheezing but does take an inhaler (Advair). She has not been using a rescue inhaler and does not have albuterol at home. She has not had problems with lower showed edema. She denies palpitations and syncope.   Allergies  Allergen Reactions  . Atorvastatin     Fatigue   . Crestor [Rosuvastatin]     Myalgias. Pt is tolerating Lipitor 83m QOD well.    Current Outpatient Prescriptions  Medication Sig Dispense Refill  . ADVAIR DISKUS 100-50 MCG/DOSE AEPB INHALE 1 PUFF INTO THE LUNGS 2 (TWO) TIMES DAILY.  60 each  4  . albuterol (PROVENTIL HFA;VENTOLIN HFA) 108 (90 BASE) MCG/ACT inhaler Inhale 2 puffs into the lungs every 6 (six) hours as needed for wheezing or shortness of breath.  1 Inhaler  1  . anastrozole (ARIMIDEX) 1 MG tablet Take 1 tablet (1 mg total) by mouth daily.  90 tablet  5  . aspirin 81 MG EC tablet Take 81 mg by mouth daily.        . Calcium Carbonate-Vitamin D (CALTRATE 600+D) 600-400 MG-UNIT per tablet Take 1 tablet by mouth daily.       . carvedilol (COREG) 25 MG  tablet Take 1 tablet (25 mg total) by mouth 2 (two) times daily with a meal.  180 tablet  2  . enalapril (VASOTEC) 2.5 MG tablet Take 5 mg by mouth daily.       . furosemide (LASIX) 20 MG tablet Take 20 mg by mouth daily.        . Multiple Vitamin (MULTI-VITAMIN DAILY PO) Take 1 tablet by mouth.       No current facility-administered medications for this visit.    Past Medical History  Diagnosis Date  . Emphysema of lung   . Cancer     breast  . Depression   . CHF (congestive heart failure)   . Hypertension   . Hyperlipidemia   . Osteopenia   . Nonischemic cardiomyopathy   . Automatic implantable cardiac defibrillator in situ     Past Surgical History  Procedure Laterality Date  . Breast surgery      mastectomy  . Appendectomy  1943  . Tonsillectomy  1940  . Cardiac defibrillator placement  10/13/2004    Medtronic Maximo VR, model #[id number] serial #[id number]  . Pacemaker insertion  06/17/2010    Medtronic [place], model #D334VRG, serial #[id number]  . Cardiac catheterization  12/18/2002    Normal coronary arteries and normal LV function  . Cardiovascular stress test  12/08/2002    Non-diagnostic exercise stress test secondary to development of  LBBB at peak exercise. Scintigraphic evidence of anterior wall thinning with suggestion of mild anterior and subtle lateral wall ischemaia.  . 2d echocardiogram  01/16/2013    EF 45-50%, moderate tricuspid valve regurg    Family History  Problem Relation Age of Onset  . Heart disease Mother   . Heart disease Father   . Hyperlipidemia Sister   . Heart disease Brother   . Cancer Daughter     Oral cancer and "knot" on right cheek that was cancer  . Heart attack Maternal Grandmother   . Heart attack Maternal Grandfather   . Heart attack Paternal Grandmother   . Heart attack Paternal Grandfather     History   Social History  . Marital Status: Widowed    Spouse Name: N/A    Number of Children: 2  . Years of Education: 11    Occupational History  . Retired-Manager Writer.    Social History Main Topics  . Smoking status: Current Some Day Smoker    Types: Cigarettes  . Smokeless tobacco: Never Used     Comment: Pt reports smoking about 1-2 times a week when stressed.   . Alcohol Use: No  . Drug Use: No  . Sexual Activity: No   Other Topics Concern  . Not on file   Social History Narrative   Health Care POA:    Emergency Contact: friend, Trevor Iha, [phone]   End of Life Plan:    Who lives with you: self, has medical alert button    Any pets: none   Diet: Patient has a varied diet and controls portions well.   Exercise: Patient does not have any regular exercise routine.   Seatbelts: Patient reports wearing seatbelt when in vehicle.   Nancy Fetter Exposure/Protection: Patient reports wearing sun screening daily.   Hobbies: clothes alterations                Review of systems: NYHA class II shortness of breath on exertion The patient specifically denies any chest pain at rest or with exertion, dyspnea at rest, orthopnea, paroxysmal nocturnal dyspnea, syncope, palpitations, focal neurological deficits, intermittent claudication, lower extremity edema, unexplained weight gain, cough, hemoptysis or wheezing.  The patient also denies abdominal pain, nausea, vomiting, dysphagia, diarrhea, constipation, polyuria, polydipsia, dysuria, hematuria, frequency, urgency, abnormal bleeding or bruising, fever, chills, unexpected weight changes, mood swings, change in skin or hair texture, change in voice quality, auditory or visual problems, allergic reactions or rashes, new musculoskeletal complaints other than usual "aches and pains".   PHYSICAL EXAM BP 152/84  Pulse 72  Ht '5\' 3"'  (1.6 m)  Wt 126 lb 11.2 oz (57.471 kg)  BMI 22.45 kg/m2  General: Alert, oriented x3, no distress Head: no evidence of trauma, PERRL, EOMI, no exophtalmos or lid lag, no myxedema, no xanthelasma; normal ears, nose and  oropharynx Neck: normal jugular venous pulsations and no hepatojugular reflux; brisk carotid pulses without delay and no carotid bruits Chest: clear to auscultation, no signs of consolidation by percussion or palpation, normal fremitus, symmetrical and full respiratory excursions ; C. defibrillator site Cardiovascular: normal position and quality of the apical impulse, regular rhythm, normal first and second heart sounds, no murmurs, rubs or gallops Abdomen: no tenderness or distention, no masses by palpation, no abnormal pulsatility or arterial bruits, normal bowel sounds, no hepatosplenomegaly Extremities: no clubbing, cyanosis or edema; 2+ radial, ulnar and brachial pulses bilaterally; 2+ right femoral, posterior tibial and dorsalis pedis pulses; 2+ left femoral, posterior tibial and  dorsalis pedis pulses; no subclavian or femoral bruits Neurological: grossly nonfocal   EKG: Sinus rhythm nonspecific IVCD (incomplete left bundle branch block)  Lipid Panel     Component Value Date/Time   CHOL 255 01/27/2008   TRIG 92 01/27/2008   HDL 62 01/27/2008   CHOLHDL 62 01/27/2008   VLDL 18 01/27/2008   LDLCALC 152* 11/05/2007 2018    BMET    Component Value Date/Time   NA 133* 02/11/2013 1055   NA 135 01/08/2013 1432   K 4.1 02/11/2013 1055   K 4.8 01/08/2013 1432   CL 96 01/08/2013 1432   CO2 29 02/11/2013 1055   CO2 32 01/08/2013 1432   GLUCOSE 100 02/11/2013 1055   GLUCOSE 84 01/08/2013 1432   BUN 13.9 02/11/2013 1055   BUN 14 01/08/2013 1432   CREATININE 1.1 02/11/2013 1055   CREATININE 1.11* 01/08/2013 1432   CREATININE 0.97 02/07/2012 1339   CALCIUM 9.6 02/11/2013 1055   CALCIUM 9.6 01/08/2013 1432   GFRNONAA 44* 06/15/2010 1937   GFRAA  Value: 53        The eGFR has been calculated using the MDRD equation. This calculation has not been validated in all clinical situations. eGFR's persistently <60 mL/min signify possible Chronic Kidney Disease.* 06/15/2010 1937     ASSESSMENT AND PLAN VENTRICULAR  TACHYCARDIA Only one 6 beat episode of nonsustained ventricular tachycardia as per records since her last ICD check.   Automatic implantable cardiac defibrillator in situ ICD check in clinic. Normal device function. Threshold and sensing consistent with previous device measurements. Impedance trends stable over time. 1 NSVT episode on 07-11-2013 x 6 bts, Max Avg V 240bpm. Histogram distribution appropriate for patient and level of activity. No changes made this session. Device programmed at appropriate safety margins. Device programmed to optimize intrinsic conduction. Current batt voltage 3.08V (RRT 2.63V). Pt enrolled in remote follow-up. Plan to check device remotely on 10-29-2013 and with [facility] in 12 months. Patient education completed including shock plan. Alert tones demonstrated for patient.   Dyspnea There are no clinical findings to suggest congestive heart failure. I gave her prescription for albuterol and asked her uses for the episodes of dyspnea to see if there is any improvement. She has done quite well on carvedilol for a long period of time, and now like to continue this treatment even if it may interfere with airway reactivity.   Patient Instructions  Remote monitoring is used to monitor your ICD from home. This monitoring reduces the number of office visits required to check your device to one time per year. It allows [place] to keep an eye on the functioning of your device to ensure it is working properly. You are scheduled for a device check from home on 10-29-2013. You may send your transmission at any time that day. If you have a wireless device, the transmission will be sent automatically. After your physician reviews your transmission, you will receive a postcard with your next transmission date.  Your physician recommends that you schedule a follow-up appointment in: 12 months      Meds ordered this encounter  Medications  . DISCONTD: albuterol (PROVENTIL HFA;VENTOLIN HFA) 108 (90  BASE) MCG/ACT inhaler    Sig: Inhale 2 puffs into the lungs every 6 (six) hours as needed for wheezing or shortness of breath.  Marland Kitchen albuterol (PROVENTIL HFA;VENTOLIN HFA) 108 (90 BASE) MCG/ACT inhaler    Sig: Inhale 2 puffs into the lungs every 6 (six) hours as needed for wheezing or  shortness of breath.    Dispense:  1 Inhaler    Refill:  Hideout Fia Hebert, MD, [facility] [phone] office [phone] pager

## 2013-07-31 ENCOUNTER — Telehealth: Payer: Self-pay | Admitting: Cardiovascular Disease

## 2013-07-31 NOTE — Telephone Encounter (Signed)
Please call.  She is not sure if transmissions are going through

## 2013-08-01 NOTE — Telephone Encounter (Signed)
I reminded patient that the last time we spoke we decided that the only option for her Carelink monitor would be to get the [facility]. Patient was also reminded of the telephone number within the brochure that was given to her at her most recent appointment, and was instructed on how the process works. Patient voiced understanding. I encouraged patient to call if problems arise.

## 2013-08-03 DIAGNOSIS — R0600 Dyspnea, unspecified: Secondary | ICD-10-CM | POA: Insufficient documentation

## 2013-08-03 NOTE — Assessment & Plan Note (Signed)
ICD check in clinic. Normal device function. Threshold and sensing consistent with previous device measurements. Impedance trends stable over time. 1 NSVT episode on 07-11-2013 x 6 bts, Max Avg V 240bpm. Histogram distribution appropriate for patient and level of activity. No changes made this session. Device programmed at appropriate safety margins. Device programmed to optimize intrinsic conduction. Current batt voltage 3.08V (RRT 2.63V). Pt enrolled in remote follow-up. Plan to check device remotely on 10-29-2013 and with [facility] in 12 months. Patient education completed including shock plan. Alert tones demonstrated for patient.

## 2013-08-03 NOTE — Assessment & Plan Note (Signed)
There are no clinical findings to suggest congestive heart failure. I gave her prescription for albuterol and asked her uses for the episodes of dyspnea to see if there is any improvement. She has done quite well on carvedilol for a long period of time, and now like to continue this treatment even if it may interfere with airway reactivity.

## 2013-08-03 NOTE — Assessment & Plan Note (Signed)
Only one 6 beat episode of nonsustained ventricular tachycardia as per records since her last ICD check.

## 2013-08-04 ENCOUNTER — Telehealth: Payer: Self-pay | Admitting: Cardiovascular Disease

## 2013-08-04 NOTE — Telephone Encounter (Signed)
The box (for remote) for pacemaker is not working right.  Please call.

## 2013-08-04 NOTE — Telephone Encounter (Signed)
Message forwarded to S. Tye Savoy, CMA r/t device/monitor concerns.

## 2013-08-07 NOTE — Telephone Encounter (Signed)
I informed Brianna [name] that she will need to come in and have her device checked q39months in the office. Patient has been unable to successfully set up her Carelink monitor. Patient was pleased with this solution.

## 2013-09-04 ENCOUNTER — Telehealth: Payer: Self-pay | Admitting: *Deleted

## 2013-09-04 MED ORDER — FLUOXETINE HCL (PMDD) 10 MG PO CAPS: 10.0000 mg | ORAL_CAPSULE | Freq: Every day | ORAL | Status: DC

## 2013-09-04 NOTE — Telephone Encounter (Signed)
Returned pt. Call. I have not seen her in the office for 2.5 years.  She reports feeling down and blue.  Denies suicidality.  She is limited in driving now.  She is sad, having crying spells, feels hopeless, is not sleeping.  Will try low dose fluoxetine and f/u in the office in 1 month.

## 2013-09-04 NOTE — Telephone Encounter (Signed)
Pt called nurse line requesting for Dr. Kennon Rounds to call her in some nerve medication.

## 2013-09-04 NOTE — Telephone Encounter (Signed)
Will forward to Dr. Kennon Rounds- patient goes to [facility] practice,not [facility] clinics.

## 2013-10-13 ENCOUNTER — Ambulatory Visit (INDEPENDENT_AMBULATORY_CARE_PROVIDER_SITE_OTHER): Payer: Medicare Other | Admitting: Family Medicine

## 2013-10-13 ENCOUNTER — Encounter: Payer: Self-pay | Admitting: Family Medicine

## 2013-10-13 VITALS — BP 151/77 | HR 78 | Temp 97.5°F | Ht 63.0 in | Wt 126.0 lb

## 2013-10-13 DIAGNOSIS — F339 Major depressive disorder, recurrent, unspecified: Secondary | ICD-10-CM | POA: Diagnosis not present

## 2013-10-13 DIAGNOSIS — I10 Essential (primary) hypertension: Secondary | ICD-10-CM

## 2013-10-13 NOTE — Progress Notes (Signed)
    Subjective:    Patient ID: Brianna [name] is a 80 y.o. female presenting with med check  on 10/13/2013  HPI: Feels cold all the time.  She is doing well. She sees Dr. Gwenlyn Found in June.  Sees oncologist yearly. Last blood work was < 1 year ago and WNL. She lives alone and the weather was bad.  Had asked for something for depression.  Tried it for 4 days, but felt worse.  She is now feeling better and does not want anything at present. She is working on some sewing projects.  Review of Systems  Constitutional: Negative for fever and chills.  Respiratory: Negative for shortness of breath.   Cardiovascular: Negative for chest pain.  Gastrointestinal: Negative for nausea, vomiting and abdominal pain.  Genitourinary: Negative for dysuria.  Skin: Negative for rash.      Objective:    BP 151/77  Pulse 78  Temp(Src) 97.5 F (36.4 C) (Oral)  Ht 5\' 3"  (1.6 m)  Wt 126 lb (57.153 kg)  BMI 22.33 kg/m2 Physical Exam  Constitutional: She is oriented to person, place, and time. She appears well-developed and well-nourished. No distress.  HENT:  Head: Normocephalic and atraumatic.  Eyes: No scleral icterus.  Neck: Neck supple.  Cardiovascular: Normal rate.   Pulmonary/Chest: Effort normal.  Abdominal: Soft.  Neurological: She is alert and oriented to person, place, and time.  Skin: Skin is warm and dry.  Psychiatric: She has a normal mood and affect.        Assessment & Plan:  DEPRESSION, MAJOR, RECURRENT Feels better.  Advised to try to find something to do.  HYPERTENSION, BENIGN SYSTEMIC BP ok--she reports BP at home 120/73.   Continue annual f/u prn  Return in about 6 months (around 04/15/2014).

## 2013-10-13 NOTE — Assessment & Plan Note (Signed)
Feels better.  Advised to try to find something to do.

## 2013-10-13 NOTE — Patient Instructions (Signed)

## 2013-10-13 NOTE — Assessment & Plan Note (Addendum)
BP ok--she reports BP at home 120/73.

## 2013-10-20 ENCOUNTER — Other Ambulatory Visit: Payer: Medicare Other

## 2013-10-20 ENCOUNTER — Other Ambulatory Visit: Payer: Self-pay | Admitting: *Deleted

## 2013-10-20 DIAGNOSIS — I5032 Chronic diastolic (congestive) heart failure: Secondary | ICD-10-CM

## 2013-10-20 LAB — IRON: Iron: 93 ug/dL (ref 42–145)

## 2013-10-20 NOTE — Progress Notes (Signed)
IRON AND FERRITIN DONE TODAY Brianna [name]

## 2013-10-21 LAB — FERRITIN: Ferritin: 74 ng/mL (ref 10–291)

## 2013-10-29 ENCOUNTER — Ambulatory Visit (INDEPENDENT_AMBULATORY_CARE_PROVIDER_SITE_OTHER): Payer: Medicare Other | Admitting: *Deleted

## 2013-10-29 ENCOUNTER — Encounter: Payer: Medicare Other | Admitting: *Deleted

## 2013-10-29 DIAGNOSIS — I472 Ventricular tachycardia: Secondary | ICD-10-CM | POA: Diagnosis not present

## 2013-10-29 DIAGNOSIS — I4729 Other ventricular tachycardia: Secondary | ICD-10-CM

## 2013-10-29 LAB — PACEMAKER DEVICE OBSERVATION

## 2013-10-29 NOTE — Patient Instructions (Signed)
Remote monitoring is used to monitor your  ICD from home. This monitoring reduces the number of office visits required to check your device to one time per year. It allows [place] to keep an eye on the functioning of your device to ensure it is working properly. You are scheduled for a device check from home on 01-29-2014. You may send your transmission at any time that day. If you have a wireless device, the transmission will be sent automatically. After your physician reviews your transmission, you will receive a postcard with your next transmission date.  Your physician recommends that you schedule a follow-up appointment in: January 2016 with Dr.Croitoru  Your physician recommends that you schedule a follow-up appointment in: September 2015 with Dr.Berry

## 2013-11-06 ENCOUNTER — Encounter: Payer: Self-pay | Admitting: *Deleted

## 2013-11-14 LAB — MDC_IDC_ENUM_SESS_TYPE_INCLINIC
Battery Voltage: 3.1 V
HighPow Impedance: 68 Ohm
Lead Channel Impedance Value: 399 Ohm
Lead Channel Pacing Threshold Pulse Width: 0.4 ms
Lead Channel Setting Pacing Amplitude: 2 V
Lead Channel Setting Pacing Pulse Width: 0.4 ms
MDC IDC MSMT LEADCHNL RV PACING THRESHOLD AMPLITUDE: 0.75 V
MDC IDC MSMT LEADCHNL RV SENSING INTR AMPL: 5 mV
MDC IDC SET LEADCHNL RV SENSING SENSITIVITY: 0.45 mV
MDC IDC STAT BRADY RV PERCENT PACED: 0.1 % — AB
Zone Setting Detection Interval: 300 ms
Zone Setting Detection Interval: 428.5 ms

## 2013-12-02 ENCOUNTER — Other Ambulatory Visit: Payer: Self-pay | Admitting: *Deleted

## 2013-12-02 MED ORDER — FUROSEMIDE 20 MG PO TABS: 20.0000 mg | ORAL_TABLET | Freq: Every day | ORAL | Status: DC

## 2013-12-02 NOTE — Telephone Encounter (Signed)
Rx refill sent to patient pharmacy   

## 2013-12-18 ENCOUNTER — Encounter: Payer: Self-pay | Admitting: Cardiovascular Disease

## 2013-12-22 ENCOUNTER — Telehealth: Payer: Self-pay | Admitting: Cardiovascular Disease

## 2013-12-22 MED ORDER — CARVEDILOL 25 MG PO TABS: 25.0000 mg | ORAL_TABLET | Freq: Two times a day (BID) | ORAL | Status: DC

## 2013-12-22 NOTE — Telephone Encounter (Signed)
Need refill on her Carvedilol 12.5 mg #60. Please call to [phone]. Pharmacist said they had been trying to get it.

## 2013-12-22 NOTE — Telephone Encounter (Signed)
Spoke with patient and informed her that her Rx had been refilled to the pharmacy. Patient voiced understanding

## 2013-12-31 ENCOUNTER — Other Ambulatory Visit: Payer: Self-pay | Admitting: *Deleted

## 2013-12-31 MED ORDER — ENALAPRIL MALEATE 2.5 MG PO TABS: 5.0000 mg | ORAL_TABLET | Freq: Every day | ORAL | Status: DC

## 2014-01-08 ENCOUNTER — Other Ambulatory Visit: Payer: Self-pay | Admitting: Physician Assistant

## 2014-01-08 DIAGNOSIS — E559 Vitamin D deficiency, unspecified: Secondary | ICD-10-CM

## 2014-01-08 DIAGNOSIS — Z853 Personal history of malignant neoplasm of breast: Secondary | ICD-10-CM

## 2014-01-08 DIAGNOSIS — M810 Age-related osteoporosis without current pathological fracture: Secondary | ICD-10-CM

## 2014-01-12 ENCOUNTER — Other Ambulatory Visit: Payer: Self-pay | Admitting: Physician Assistant

## 2014-01-13 ENCOUNTER — Telehealth: Payer: Self-pay | Admitting: *Deleted

## 2014-01-13 NOTE — Progress Notes (Signed)
ICD check in clinic. Normal device function. Threshold and sensing consistent with previous device measurements. Impedance trends stable over time. 1 NSVT episode x 8 bts @ 207bpm. Histogram distribution appropriate for patient and level of activity. No changes made this session. Device programmed at appropriate safety margins. Device programmed to optimize intrinsic conduction. Batt voltage 3.10V (ERI 2.63V). Pt enrolled in remote follow-up. Plan to follow up via Carelink on 7-9 and with [facility] in January 2016. Patient education completed including shock plan. Alert tones demonstrated for patient.

## 2014-01-13 NOTE — Telephone Encounter (Signed)
Spoke with patient and informed her of provider change to Burns Spain, NP

## 2014-01-15 ENCOUNTER — Ambulatory Visit
Admission: RE | Admit: 2014-01-15 | Discharge: 2014-01-15 | Disposition: A | Discharge status: Home / Self Care | Payer: Medicare Other | Source: Ambulatory Visit | Attending: Family | Admitting: Family

## 2014-01-15 DIAGNOSIS — Z853 Personal history of malignant neoplasm of breast: Secondary | ICD-10-CM

## 2014-01-15 DIAGNOSIS — Z1231 Encounter for screening mammogram for malignant neoplasm of breast: Secondary | ICD-10-CM | POA: Diagnosis not present

## 2014-01-15 DIAGNOSIS — M949 Disorder of cartilage, unspecified: Secondary | ICD-10-CM | POA: Diagnosis not present

## 2014-01-15 DIAGNOSIS — M810 Age-related osteoporosis without current pathological fracture: Secondary | ICD-10-CM

## 2014-01-15 DIAGNOSIS — M899 Disorder of bone, unspecified: Secondary | ICD-10-CM | POA: Diagnosis not present

## 2014-01-19 ENCOUNTER — Ambulatory Visit: Payer: Medicare Other

## 2014-01-19 ENCOUNTER — Other Ambulatory Visit (HOSPITAL_BASED_OUTPATIENT_CLINIC_OR_DEPARTMENT_OTHER): Payer: Medicare Other

## 2014-01-19 ENCOUNTER — Other Ambulatory Visit: Payer: Medicare Other | Admitting: Lab

## 2014-01-19 ENCOUNTER — Ambulatory Visit: Payer: Medicare Other | Admitting: Oncology

## 2014-01-19 ENCOUNTER — Ambulatory Visit (HOSPITAL_BASED_OUTPATIENT_CLINIC_OR_DEPARTMENT_OTHER): Payer: Federal, State, Local not specified - PPO

## 2014-01-19 ENCOUNTER — Ambulatory Visit (HOSPITAL_BASED_OUTPATIENT_CLINIC_OR_DEPARTMENT_OTHER): Payer: Medicare Other | Admitting: Nurse Practitioner

## 2014-01-19 VITALS — BP 157/54 | HR 91 | Temp 97.6°F | Resp 18 | Ht 63.0 in | Wt 122.0 lb

## 2014-01-19 DIAGNOSIS — E559 Vitamin D deficiency, unspecified: Secondary | ICD-10-CM

## 2014-01-19 DIAGNOSIS — M810 Age-related osteoporosis without current pathological fracture: Secondary | ICD-10-CM | POA: Diagnosis not present

## 2014-01-19 DIAGNOSIS — Z853 Personal history of malignant neoplasm of breast: Secondary | ICD-10-CM

## 2014-01-19 LAB — CBC WITH DIFFERENTIAL/PLATELET
BASO%: 0.2 % (ref 0.0–2.0)
Basophils Absolute: 0 10*3/uL (ref 0.0–0.1)
EOS ABS: 0.1 10*3/uL (ref 0.0–0.5)
EOS%: 1.1 % (ref 0.0–7.0)
HEMATOCRIT: 38.1 % (ref 34.8–46.6)
HEMOGLOBIN: 13 g/dL (ref 11.6–15.9)
LYMPH#: 1.3 10*3/uL (ref 0.9–3.3)
LYMPH%: 23.8 % (ref 14.0–49.7)
MCH: 30.3 pg (ref 25.1–34.0)
MCHC: 34.1 g/dL (ref 31.5–36.0)
MCV: 88.8 fL (ref 79.5–101.0)
MONO#: 0.5 10*3/uL (ref 0.1–0.9)
MONO%: 9.8 % (ref 0.0–14.0)
NEUT%: 65.1 % (ref 38.4–76.8)
NEUTROS ABS: 3.6 10*3/uL (ref 1.5–6.5)
Platelets: 184 10*3/uL (ref 145–400)
RBC: 4.29 10*6/uL (ref 3.70–5.45)
RDW: 13.4 % (ref 11.2–14.5)
WBC: 5.5 10*3/uL (ref 3.9–10.3)

## 2014-01-19 LAB — COMPREHENSIVE METABOLIC PANEL (CC13)
ALT: 16 U/L (ref 0–55)
ANION GAP: 10 meq/L (ref 3–11)
AST: 29 U/L (ref 5–34)
Albumin: 4 g/dL (ref 3.5–5.0)
Alkaline Phosphatase: 72 U/L (ref 40–150)
BUN: 10.6 mg/dL (ref 7.0–26.0)
CALCIUM: 9.6 mg/dL (ref 8.4–10.4)
CHLORIDE: 96 meq/L — AB (ref 98–109)
CO2: 26 meq/L (ref 22–29)
Creatinine: 1 mg/dL (ref 0.6–1.1)
Glucose: 101 mg/dl (ref 70–140)
Potassium: 4.3 mEq/L (ref 3.5–5.1)
Sodium: 132 mEq/L — ABNORMAL LOW (ref 136–145)
Total Bilirubin: 0.51 mg/dL (ref 0.20–1.20)
Total Protein: 7.6 g/dL (ref 6.4–8.3)

## 2014-01-19 LAB — VITAMIN D 25 HYDROXY (VIT D DEFICIENCY, FRACTURES): Vit D, 25-Hydroxy: 78 ng/mL (ref 30–89)

## 2014-01-19 MED ORDER — DENOSUMAB 60 MG/ML ~~LOC~~ SOLN
60.0000 mg | Freq: Once | SUBCUTANEOUS | Status: AC
  Administered 2014-01-19: 60 mg via SUBCUTANEOUS
  Filled 2014-01-19: qty 1

## 2014-01-19 NOTE — Progress Notes (Signed)
[place]  [phone] [phone]  OFFICE PROGRESS NOTE   ID: ELINOR KLEINE   DOB: May 10, 1930  MR#: [id number]  JKK#:[id number]   PCP: Donnamae Jude, MD SU:   Lennie Hummer, M.D. CARDIO: Terance Ice, M.D.   HISTORY OF PRESENT ILLNESS: From Dr. Collier Salina Rubin's new patient evaluation note dated 01/28/2003: "Ms. Driver is a 80 year old woman living in [place]. This woman has been in relatively good health all of her life.  She saw her primary care doctor and was complaining of some shortness of breath.  There was a question of whether she had irregular heart rate.  She had a fairly extensive workup for this.  In addition, at the time of her primary physical examination she was found to have a mass in her breast.  Two months prior to that visit, she apparently had fallen off some steps and bruised her left breast in January.  She thought that this was a hematoma related to that.  At the time of her primary care doctor's visit in April, she was sent for a mammogram.   Mammogram was performed on Nov 27, 2002 and confirmed the presence on physical examination, of a mass, measuring about 3.0 cm and with an enlarged lymph node present in the left axilla.  Ultrasound also confirmed the presence of a mass.  Mammogram did show calcifications spread over an area of 5.0 cm.  Biopsy of both the mass and left axillary lymph node was performed on Nov 27, 2002, the results of which showed invasive ductal cancer.  Due to the size of the lesion, it was not felt that she was a good candidate for lumpectomy. She had undergone a cardiac catheterization by Dr. Gwenlyn Found prior to that surgery and it turned out to be fine without any abnormalities.  The patient underwent mastectomy and lymph node dissection on December 30, 2002.  Final pathology showed a 2.2 cm grade 3 of 3 invasive ductal cancer.  Lymphovascular invasion was seen.   A total of 12 lymph nodes were identified, 11 of which had  evidence of metastatic disease.  The tumor was ER/PR positive, proliferative index of 50%.  HER-2/neu was negative.   DNA index was 1.  Ms. Farve has had a course complicated by postoperative wound infection, requiring several hospitalizations and IV antibiotics. She has since been discharged from the hospital and is doing well."  Her subsequent history is as detailed below.    INTERVAL HISTORY: Dr. Jana Hakim and I saw JALEXIS BREED today for followup of invasive ductal carcinoma of the left breast.  The patient was last seen by Dr. Jana Hakim on 02/22/2013. She presents today in follow up. She has no new complaints and actually would like to be followed by her primary care physician.    REVIEW OF SYSTEMS: A 10 point review of systems was completed and is negative except for generalized fatigue. She is concerned that the Arimidex is contributing to this, and wants to stop taking it. She was encouraged to do so.     Ms. Eble denies any other symptomatology.   PAST MEDICAL HISTORY: Past Medical History  Diagnosis Date  . Emphysema of lung   . Cancer     breast  . Depression   . CHF (congestive heart failure)   . Hypertension   . Hyperlipidemia   . Osteopenia   . Nonischemic cardiomyopathy   . Automatic implantable cardiac defibrillator in situ     PAST SURGICAL HISTORY: Past  Surgical History  Procedure Laterality Date  . Breast surgery      mastectomy  . Appendectomy  1941/09/27  . Tonsillectomy  1940  . Cardiac defibrillator placement  10/13/2004    Medtronic Maximo VR, model [id number], serial [id number]  . Pacemaker insertion  06/17/2010    Medtronic Armorel VR, model #[id number], serial [id number]  . Cardiac catheterization  12/18/2002    Normal coronary arteries and normal LV function  . Cardiovascular stress test  12/08/2002    Non-diagnostic exercise stress test secondary to development of LBBB at peak exercise. Scintigraphic evidence of anterior wall thinning with suggestion of  mild anterior and subtle lateral wall ischemaia.  . 2d echocardiogram  01/16/2013    EF 45-50%, moderate tricuspid valve regurg    FAMILY HISTORY Family History  Problem Relation Age of Onset  . Heart disease Mother   . Heart disease Father   . Hyperlipidemia Sister   . Heart disease Brother   . Cancer Daughter     Oral cancer and "knot" on right cheek that was cancer  . Heart attack Maternal Grandmother   . Heart attack Maternal Grandfather   . Heart attack Paternal Grandmother   . Heart attack Paternal Grandfather   Two sisters and one brother the brother has cardiac problems.  She has a twin sister.   GYNECOLOGIC HISTORY: Menarche and menopause are unknown dates.  No history of recent mammograms prior to this current one.  No history of hormone replacement therapy.  She is gravida 2, para 2.  No history of breast feeding.    SOCIAL HISTORY: Ms. Rochette smoked cigarettes until about age 56.  She lives in [place] by herself and became a widow in 09/27/00.  Her husband died  from pancreatic cancer.  This was her second marriage, of 15 years.  Her first marriage was about 25 years. She has two children.  Her son lives in [place], [place] and her daughter lives near [place].  She has 2 grandsons. She also has a brother in [place].  In her spare time she enjoys watching movies, traveling, and going for long automobile drives.    ADVANCED DIRECTIVES: Not on file  HEALTH MAINTENANCE: History  Substance Use Topics  . Smoking status: Current Some Day Smoker    Types: Cigarettes  . Smokeless tobacco: Never Used     Comment: Pt reports smoking about 1-2 times a week when stressed.   . Alcohol Use: No    Colonoscopy: Not on file PAP: 01/21/2005 Bone density:  The patient's last bone density scan on 0 showed a T score of -2.2 (osteopenia). Lipid panel: Not on file  Allergies  Allergen Reactions  . Atorvastatin     Fatigue   . Claritin [Loratadine] Other (See  Comments)    fatigue  . Crestor [Rosuvastatin]     Myalgias. Pt is tolerating Lipitor 41m QOD well.    Current Outpatient Prescriptions  Medication Sig Dispense Refill  . ADVAIR DISKUS 100-50 MCG/DOSE AEPB INHALE 1 PUFF INTO THE LUNGS 2 (TWO) TIMES DAILY.  60 each  4  . albuterol (PROVENTIL HFA;VENTOLIN HFA) 108 (90 BASE) MCG/ACT inhaler Inhale 2 puffs into the lungs every 6 (six) hours as needed for wheezing or shortness of breath.  1 Inhaler  1  . anastrozole (ARIMIDEX) 1 MG tablet Take 1 tablet (1 mg total) by mouth daily.  90 tablet  5  . aspirin 81 MG EC tablet Take 81 mg by mouth daily.        .Marland Kitchen  Calcium Carbonate-Vitamin D (CALTRATE 600+D) 600-400 MG-UNIT per tablet Take 1 tablet by mouth daily.       . carvedilol (COREG) 25 MG tablet Take 1 tablet (25 mg total) by mouth 2 (two) times daily with a meal.  180 tablet  2  . enalapril (VASOTEC) 2.5 MG tablet Take 2 tablets (5 mg total) by mouth daily.  90 tablet  3  . enalapril (VASOTEC) 2.5 MG tablet Take 2 tablets (5 mg total) by mouth daily.  90 tablet  3  . FLUoxetine (PROZAC) 10 MG capsule Take 10 mg by mouth.      . furosemide (LASIX) 20 MG tablet Take 1 tablet (20 mg total) by mouth daily.  90 tablet  3  . Ginkgo Biloba 40 MG TABS Take by mouth daily.      . Multiple Vitamin (MULTI-VITAMIN DAILY PO) Take 1 tablet by mouth.       No current facility-administered medications for this visit.    OBJECTIVE: Filed Vitals:   01/19/14 0924  BP: 157/54  Pulse: 91  Temp: 97.6 F (36.4 C)  Resp: 18     Body mass index is 21.62 kg/(m^2).      ECOG FS: 1 - Symptomatic but completely ambulatory  General appearance: Alert, cooperative, thin frame no apparent distress Head: Normocephalic, without obvious abnormality, atraumatic Eyes: Arcus senilis, PERRLA, EOMI, left eye amblyopia Neck: No adenopathy, supple, symmetrical, trachea midline, no tenderness Resp: Clear to auscultation bilaterally Cardio: Regular rate and rhythm, S1, S2  normal, no murmur, click, rub or gallop, right chest implanted ICD Breasts: Left breast is surgically absent, left breast has well-healed surgical scars, right breast does not have any nipple inversion, no axilla fullness bilaterally GI: Soft, distended, non-tender, hypoactive bowel sounds, no organomegaly Extremities: Extremities normal, atraumatic, no cyanosis or edema, bilateral lower extremity varicose veins, left lower extremity skin erythema (appears to be an insect bite), left upper extremity limited range of motion Lymph nodes: Cervical, supraclavicular, and axillary nodes normal Neurologic: Grossly normal    LAB RESULTS: Lab Results  Component Value Date   WBC 5.5 01/19/2014   NEUTROABS 3.6 01/19/2014   HGB 13.0 01/19/2014   HCT 38.1 01/19/2014   MCV 88.8 01/19/2014   PLT 184 01/19/2014      Chemistry      Component Value Date/Time   NA 132* 01/19/2014 0913   NA 135 01/08/2013 1432   K 4.3 01/19/2014 0913   K 4.8 01/08/2013 1432   CL 96 01/08/2013 1432   CO2 26 01/19/2014 0913   CO2 32 01/08/2013 1432   BUN 10.6 01/19/2014 0913   BUN 14 01/08/2013 1432   CREATININE 1.0 01/19/2014 0913   CREATININE 1.11* 01/08/2013 1432   CREATININE 0.97 02/07/2012 1339      Component Value Date/Time   CALCIUM 9.6 01/19/2014 0913   CALCIUM 9.6 01/08/2013 1432   ALKPHOS 72 01/19/2014 0913   ALKPHOS 74 01/08/2013 1432   AST 29 01/19/2014 0913   AST 26 01/08/2013 1432   ALT 16 01/19/2014 0913   ALT 12 01/08/2013 1432   BILITOT 0.51 01/19/2014 0913   BILITOT 0.4 01/08/2013 1432      Lab Results  Component Value Date   LABCA2 24 02/07/2012    Urinalysis    Component Value Date/Time   COLORURINE YELLOW 06/15/2010 2135   APPEARANCEUR CLOUDY* 06/15/2010 2135   LABSPEC 1.006 06/15/2010 2135   PHURINE 7.0 06/15/2010 2135   GLUCOSEU NEGATIVE 06/15/2010 2135   HGBUR  NEGATIVE 06/15/2010 2135   BILIRUBINUR NEG 10/26/2010 [place] 06/15/2010 2135   [place] 06/15/2010 2135    PROTEINUR NEG 10/26/2010 1435   PROTEINUR NEGATIVE 06/15/2010 2135   UROBILINOGEN 0.2 10/26/2010 1435   UROBILINOGEN 0.2 06/15/2010 2135   NITRITE NEG 10/26/2010 1435   NITRITE NEGATIVE 06/15/2010 2135   LEUKOCYTESUR LARGE 10/26/2010 1435    STUDIES: 1.  The patient's last screening unilateral right digital mammogram on 01/15/2014 showed the patient had a left mastectomy.  No suspicious masses, architectural distortion, or calcifications are present.  No evidence of malignancy.  2.  The patient's last bone density scan on 01/15/2014 showed a T score of -2.2  (osteopenia).    ASSESSMENT: Ms. Laumann is a 80 y.o. [place] woman: 1.  Status post left breast needle core biopsy with left axillary needle core biopsy on 11/27/2002 which showed left breast invasive ductal carcinoma, estrogen receptor 97% positive, progesterone receptor 85% positive, Ki-67 50%, HER-2/neu by immunocytochemical stain negative, and left axillary lymph node showed metastatic ductal carcinoma.  2.  Status post left breast radical mastectomy with left axillary lymph node resection on 12/30/2002 for a stage IIIB, pT2, pN3, pMX, 3.2 cm poorly differentiated infiltrating ductal carcinoma grade 3 with metastases to 11 of 12 axillary lymph nodes, prognostic panel not repeated.  3.  Status post adjuvant chemotherapy with dose dense Adriamycin/Cytoxan x 4 cycles from 02/16/2003 through 04/01/2003 followed by chemotherapy with Taxol weekly x 12 weeks from 04/15/2003 through 07/01/2003.  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  Ejection fraction has ranged from 50-55% in 2007 to current ejection fraction from 2-D echocardiogram in 12/2012 showing 45% to 50%.  Cardiomyopathy was being followed by Dr. Rollene Fare, Cardiologist but he has since retired and she does not recall the name of her current cardiologist.  She underwent left modified radical mastectomy in June of 2004 for a pT2 pN3, stage IIIB  invasive ductal carcinoma, grade 3, estrogen receptor 97% positive, progesterone receptor 85% positive, with an elevated MIB-1-1 of 50%, but no HER-2 amplification. Adjuvantly she received dose dense doxorubicin and cyclophosphamide x4 followed by weekly paclitaxel x12, all chemotherapy completed in December of 2004.   4. As per a Dr. Julien Girt note of 02/14/2012, the patient next received postmastectomy radiation to the chest wall. .  5.  The patient started antiestrogen therapy with Arimidex in 06/2003.  6.  Fatigue    PLAN:   Ms. Hayter will receive her Prolia injection today. Although last year she felt strongly about continuing Anastrozole indefinitely, today she asked about stopping the medication. She is concerned that it is contributing to her fatigue. She was reassured that she most definitely can stop, as we actually do not have data for any benefit beyond 5 years and that it is negatively affecting her osteoporosis. She did have her bone density scan and her mammogram. She also asked about following up with her primary care doctor instead of coming here, as she has "so many doctors I have to see every year, is this really necessary?"  We are happy to discharge her to the care of her primary care physician. She knows she needs to continue to have annual mammograms and clinical breast exams. She is at low risk of recurrence. We will see her back only as needed.    [facility], NP, [place]

## 2014-01-19 NOTE — Patient Instructions (Signed)
Denosumab injection What is this medicine? DENOSUMAB (den oh sue mab) slows bone breakdown. Prolia is used to treat osteoporosis in women after menopause and in men. Xgeva is used to prevent bone fractures and other bone problems caused by cancer bone metastases. Xgeva is also used to treat giant cell tumor of the bone. This medicine may be used for other purposes; ask your health care provider or pharmacist if you have questions. COMMON BRAND NAME(S): Prolia, XGEVA What should I tell my health care provider before I take this medicine? They need to know if you have any of these conditions: -dental disease -eczema -infection or history of infections -kidney disease or on dialysis -low blood calcium or vitamin D -malabsorption syndrome -scheduled to have surgery or tooth extraction -taking medicine that contains denosumab -thyroid or parathyroid disease -an unusual reaction to denosumab, other medicines, foods, dyes, or preservatives -pregnant or trying to get pregnant -breast-feeding How should I use this medicine? This medicine is for injection under the skin. It is given by a health care professional in a hospital or clinic setting. If you are getting Prolia, a special MedGuide will be given to you by the pharmacist with each prescription and refill. Be sure to read this information carefully each time. For Prolia, talk to your pediatrician regarding the use of this medicine in children. Special care may be needed. For Xgeva, talk to your pediatrician regarding the use of this medicine in children. While this drug may be prescribed for children as young as 13 years for selected conditions, precautions do apply. Overdosage: If you think you've taken too much of this medicine contact a poison control center or emergency room at once. Overdosage: If you think you have taken too much of this medicine contact a poison control center or emergency room at once. NOTE: This medicine is only for  you. Do not share this medicine with others. What if I miss a dose? It is important not to miss your dose. Call your doctor or health care professional if you are unable to keep an appointment. What may interact with this medicine? Do not take this medicine with any of the following medications: -other medicines containing denosumab This medicine may also interact with the following medications: -medicines that suppress the immune system -medicines that treat cancer -steroid medicines like prednisone or cortisone This list may not describe all possible interactions. Give your health care provider a list of all the medicines, herbs, non-prescription drugs, or dietary supplements you use. Also tell them if you smoke, drink alcohol, or use illegal drugs. Some items may interact with your medicine. What should I watch for while using this medicine? Visit your doctor or health care professional for regular checks on your progress. Your doctor or health care professional may order blood tests and other tests to see how you are doing. Call your doctor or health care professional if you get a cold or other infection while receiving this medicine. Do not treat yourself. This medicine may decrease your body's ability to fight infection. You should make sure you get enough calcium and vitamin D while you are taking this medicine, unless your doctor tells you not to. Discuss the foods you eat and the vitamins you take with your health care professional. See your dentist regularly. Brush and floss your teeth as directed. Before you have any dental work done, tell your dentist you are receiving this medicine. Do not become pregnant while taking this medicine or for 5 months after stopping   it. Women should inform their doctor if they wish to become pregnant or think they might be pregnant. There is a potential for serious side effects to an unborn child. Talk to your health care professional or pharmacist for more  information. What side effects may I notice from receiving this medicine? Side effects that you should report to your doctor or health care professional as soon as possible: -allergic reactions like skin rash, itching or hives, swelling of the face, lips, or tongue -breathing problems -chest pain -fast, irregular heartbeat -feeling faint or lightheaded, falls -fever, chills, or any other sign of infection -muscle spasms, tightening, or twitches -numbness or tingling -skin blisters or bumps, or is dry, peels, or red -slow healing or unexplained pain in the mouth or jaw -unusual bleeding or bruising Side effects that usually do not require medical attention (Report these to your doctor or health care professional if they continue or are bothersome.): -muscle pain -stomach upset, gas This list may not describe all possible side effects. Call your doctor for medical advice about side effects. You may report side effects to FDA at 1-800-FDA-1088. Where should I keep my medicine? This medicine is only given in a clinic, doctor's office, or other health care setting and will not be stored at home. NOTE: This sheet is a summary. It may not cover all possible information. If you have questions about this medicine, talk to your doctor, pharmacist, or health care provider.  2015, Elsevier/Gold Standard. (2012-01-08 12:37:47)  

## 2014-01-29 ENCOUNTER — Telehealth: Payer: Self-pay | Admitting: Cardiology

## 2014-01-29 ENCOUNTER — Encounter: Payer: Medicare Other | Admitting: *Deleted

## 2014-01-29 ENCOUNTER — Telehealth: Payer: Self-pay | Admitting: Cardiovascular Disease

## 2014-01-29 NOTE — Telephone Encounter (Signed)
Spoke with pt and informed pt that we still haven't received transmission. Gave pt tech support number to call and receive help with trouble shooting monitor. Pt verbalized understanding.

## 2014-01-29 NOTE — Telephone Encounter (Signed)
Spoke with pt and reminded pt of remote transmission that is due today. Pt verbalized understanding.   

## 2014-01-29 NOTE — Telephone Encounter (Signed)
New problem   Pt need someone to call her about her remote transmission. Please call pt.

## 2014-01-29 NOTE — Telephone Encounter (Signed)
Spoke with pt and help pt trouble shoot monitor. Pt to call back around 1:30

## 2014-01-30 ENCOUNTER — Encounter: Payer: Self-pay | Admitting: Cardiology

## 2014-02-10 ENCOUNTER — Other Ambulatory Visit: Payer: Medicare Other | Admitting: Lab

## 2014-02-16 ENCOUNTER — Ambulatory Visit (INDEPENDENT_AMBULATORY_CARE_PROVIDER_SITE_OTHER): Payer: Medicare Other | Admitting: *Deleted

## 2014-02-16 ENCOUNTER — Telehealth: Payer: Self-pay | Admitting: Cardiovascular Disease

## 2014-02-16 DIAGNOSIS — I4729 Other ventricular tachycardia: Secondary | ICD-10-CM | POA: Diagnosis not present

## 2014-02-16 DIAGNOSIS — Z95810 Presence of automatic (implantable) cardiac defibrillator: Secondary | ICD-10-CM

## 2014-02-16 DIAGNOSIS — I5032 Chronic diastolic (congestive) heart failure: Secondary | ICD-10-CM | POA: Diagnosis not present

## 2014-02-16 DIAGNOSIS — I472 Ventricular tachycardia: Secondary | ICD-10-CM | POA: Diagnosis not present

## 2014-02-16 LAB — MDC_IDC_ENUM_SESS_TYPE_INCLINIC
Battery Voltage: 3.07 V
Brady Statistic RV Percent Paced: 0 %
HIGH POWER IMPEDANCE MEASURED VALUE: 190 Ohm
HighPow Impedance: 361 Ohm
HighPow Impedance: 74 Ohm
Lead Channel Impedance Value: 456 Ohm
Lead Channel Pacing Threshold Pulse Width: 0.4 ms
Lead Channel Setting Pacing Amplitude: 2 V
MDC IDC MSMT LEADCHNL RV PACING THRESHOLD AMPLITUDE: 0.75 V
MDC IDC MSMT LEADCHNL RV SENSING INTR AMPL: 4 mV
MDC IDC MSMT LEADCHNL RV SENSING INTR AMPL: 8.125 mV
MDC IDC SESS DTM: 20150727145035
MDC IDC SET LEADCHNL RV PACING PULSEWIDTH: 0.4 ms
MDC IDC SET LEADCHNL RV SENSING SENSITIVITY: 0.45 mV
MDC IDC SET ZONE DETECTION INTERVAL: 300 ms
MDC IDC SET ZONE DETECTION INTERVAL: 360 ms
Zone Setting Detection Interval: 430 ms

## 2014-02-16 LAB — PACEMAKER DEVICE OBSERVATION

## 2014-02-16 NOTE — Progress Notes (Signed)
ICD check in clinic due to pt hearing a popping sound from her chest on 02/15/14. Normal device function. Thresholds and sensing consistent with previous device measurements. Impedance trends stable over time. 1 monitored VT--13 sec w/ max-V at 171bpm. 1 NSVT---6 beats. Histogram distribution appropriate for patient and level of activity. Device programmed at appropriate safety margins---changed VF NID from 18/24 to 30/40, monitored initial beats from 24 to 32. Device programmed to optimize intrinsic conduction. Battery @3 .07V. Pt enrolled in remote follow-up. Alert tones demonstrated for patient but pt cannot hear tones. [facility]ative [facility] (upon pt calling tech svcs )05/18/14 & ROV w/ Dr. Sallyanne Kuster 1/16.

## 2014-02-16 NOTE — Telephone Encounter (Signed)
Defibrillator popped very loud Sunday.  Sounded like it was going to explode.  Has not done since Sundaybut it feels like it's not working.Brianna KitchenMarland [name]

## 2014-02-16 NOTE — Telephone Encounter (Signed)
Made appt for today due to popping noise. Pt knows to come to [facility], not [facility]. Gave pt [employer]. Address & my direct #.

## 2014-02-16 NOTE — Telephone Encounter (Signed)
Returned call to patient she stated her ICD made a very loud pop yesterday while drinking a cup of coffee.Stated she did not get shocked.Stated sound was so loud it made her medical alert go off.Stated she feels ok.Message sent to [facility] in device clinic, he will call patient and take care of.

## 2014-02-17 ENCOUNTER — Ambulatory Visit: Payer: Medicare Other | Admitting: Oncology

## 2014-02-23 ENCOUNTER — Encounter: Payer: Self-pay | Admitting: Cardiovascular Disease

## 2014-03-01 ENCOUNTER — Other Ambulatory Visit: Payer: Self-pay | Admitting: Cardiovascular Disease

## 2014-03-02 NOTE — Telephone Encounter (Signed)
Rx refill sent to patient pharmacy   

## 2014-03-07 ENCOUNTER — Other Ambulatory Visit: Payer: Self-pay | Admitting: Family Medicine

## 2014-03-16 ENCOUNTER — Other Ambulatory Visit: Payer: Self-pay | Admitting: *Deleted

## 2014-03-16 DIAGNOSIS — Z853 Personal history of malignant neoplasm of breast: Secondary | ICD-10-CM

## 2014-03-16 MED ORDER — ANASTROZOLE 1 MG PO TABS
1.0000 mg | ORAL_TABLET | Freq: Every day | ORAL | Status: DC
Start: 2014-03-16 — End: 2014-03-24

## 2014-03-24 ENCOUNTER — Encounter: Payer: Self-pay | Admitting: Cardiovascular Disease

## 2014-03-24 ENCOUNTER — Ambulatory Visit (INDEPENDENT_AMBULATORY_CARE_PROVIDER_SITE_OTHER): Payer: Medicare Other | Admitting: Cardiovascular Disease

## 2014-03-24 VITALS — BP 138/82 | HR 77 | Ht 61.0 in | Wt 124.0 lb

## 2014-03-24 DIAGNOSIS — R0989 Other specified symptoms and signs involving the circulatory and respiratory systems: Secondary | ICD-10-CM

## 2014-03-24 DIAGNOSIS — R0600 Dyspnea, unspecified: Secondary | ICD-10-CM

## 2014-03-24 DIAGNOSIS — E7800 Pure hypercholesterolemia, unspecified: Secondary | ICD-10-CM | POA: Diagnosis not present

## 2014-03-24 DIAGNOSIS — I428 Other cardiomyopathies: Secondary | ICD-10-CM | POA: Insufficient documentation

## 2014-03-24 DIAGNOSIS — Z95810 Presence of automatic (implantable) cardiac defibrillator: Secondary | ICD-10-CM

## 2014-03-24 DIAGNOSIS — R0609 Other forms of dyspnea: Secondary | ICD-10-CM

## 2014-03-24 DIAGNOSIS — I10 Essential (primary) hypertension: Secondary | ICD-10-CM

## 2014-03-24 NOTE — Assessment & Plan Note (Signed)
History of chemotherapy-related nonischemic myopathy. Her most recent echo performed 02/27/12 revealed an ejection fraction of 45-50% with global hypokinesia and septal wall motion and a mallet consistent with a conduction abnormality. I did perform cardiac catheterization on her 12/18/02 revealing normal coronary arteries. She had an ICD implanted by Dr. Elvis Coil March of 2006 for primary prevention. She does complain of dyspnea on exertion which is chronic.

## 2014-03-24 NOTE — Assessment & Plan Note (Signed)
Her last lipid profile performed one year ago was excellent. She's not on a statin drug.

## 2014-03-24 NOTE — Progress Notes (Signed)
03/24/2014 Brianna [name]   1929-08-08  [id number]  Primary Physician Donnamae Jude, MD Primary Cardiologist: Lorretta Harp MD [name]   HPI:  The patient is a very pleasant 80 year old thin appearing widowed Caucasian female mother of 2, grandmother to 2 grandchildren who has a history of nonischemic cardiomyopathy related to chemotherapy for breast cancer 10 years ago. She had an ICD placed for primary prevention by Dr. Elvis Coil in March of 2006 which has been followed by Dr. Sallyanne Kuster.  Her most recent 2D echo performed in our office on February 27, 2012 revealed an EF of 45-50% with mild global hypokinesia and septal wall motion abnormality consistent with a conduction abnormality. She does complain of dyspnea on exertion but denies chest pain.     Current Outpatient Prescriptions  Medication Sig Dispense Refill  . ADVAIR DISKUS 100-50 MCG/DOSE AEPB INHALE 1 PUFF INTO THE LUNGS 2 (TWO) TIMES DAILY.  60 each  4  . aspirin 81 MG EC tablet Take 81 mg by mouth daily.        . Calcium Carbonate-Vitamin D (CALTRATE 600+D) 600-400 MG-UNIT per tablet Take 1 tablet by mouth daily.       . carvedilol (COREG) 25 MG tablet Take 1 tablet (25 mg total) by mouth 2 (two) times daily with a meal.  180 tablet  2  . enalapril (VASOTEC) 2.5 MG tablet Take 2 tablets (5 mg total) by mouth daily.  90 tablet  3  . furosemide (LASIX) 20 MG tablet Take 1 tablet (20 mg total) by mouth daily.  90 tablet  3  . Ginkgo Biloba 40 MG TABS Take by mouth daily.      . Multiple Vitamin (MULTI-VITAMIN DAILY PO) Take 1 tablet by mouth.      Marland Kitchen PROAIR HFA 108 (90 BASE) MCG/ACT inhaler INHALE 2 PUFFS EVERY 6 HOURS AS NEEDED FOR WHEEZING OR SHORTNESS OF BREATH  8.5 each  1   No current facility-administered medications for this visit.    Allergies  Allergen Reactions  . Atorvastatin     Fatigue   . Claritin [Loratadine] Other (See Comments)    fatigue  . Crestor [Rosuvastatin]     Myalgias. Pt is  tolerating Lipitor 20mg  QOD well.    History   Social History  . Marital Status: Widowed    Spouse Name: N/A    Number of Children: 2  . Years of Education: 11   Occupational History  . Retired-Manager Writer.    Social History Main Topics  . Smoking status: Current Some Day Smoker    Types: Cigarettes  . Smokeless tobacco: Never Used     Comment: Pt reports smoking about 1-2 times a week when stressed.   . Alcohol Use: No  . Drug Use: No  . Sexual Activity: No   Other Topics Concern  . Not on file   Social History Narrative   Health Care POA:    Emergency Contact: friend, Trevor Iha, [phone]   End of Life Plan:    Who lives with you: self, has medical alert button    Any pets: none   Diet: Patient has a varied diet and controls portions well.   Exercise: Patient does not have any regular exercise routine.   Seatbelts: Patient reports wearing seatbelt when in vehicle.   Nancy Fetter Exposure/Protection: Patient reports wearing sun screening daily.   Hobbies: clothes alterations  Review of Systems: General: negative for chills, fever, night sweats or weight changes.  Cardiovascular: negative for chest pain, dyspnea on exertion, edema, orthopnea, palpitations, paroxysmal nocturnal dyspnea or shortness of breath Dermatological: negative for rash Respiratory: negative for cough or wheezing Urologic: negative for hematuria Abdominal: negative for nausea, vomiting, diarrhea, bright red blood per rectum, melena, or hematemesis Neurologic: negative for visual changes, syncope, or dizziness All other systems reviewed and are otherwise negative except as noted above.    Blood pressure 138/82, pulse 77, height 5\' 1"  (1.549 m), weight 124 lb (56.246 kg).  General appearance: alert and no distress Neck: no adenopathy, no carotid bruit, no JVD, supple, symmetrical, trachea midline and thyroid not enlarged, symmetric, no tenderness/mass/nodules Lungs: clear  to auscultation bilaterally Heart: regular rate and rhythm, S1, S2 normal, no murmur, click, rub or gallop Extremities: extremities normal, atraumatic, no cyanosis or edema  EKG sinus rhythm at 77 with septal Q waves and left bundle branch block  ASSESSMENT AND PLAN:   HYPERCHOLESTEROLEMIA Her last lipid profile performed one year ago was excellent. She's not on a statin drug.  HYPERTENSION, BENIGN SYSTEMIC Controlled on current medications  Automatic implantable cardiac defibrillator in situ Followed by Dr. Sallyanne Kuster as an outpatient. He last saw her in the office 08/03/13. She apparently does quarterly downloads  at home.  Nonischemic cardiomyopathy History of chemotherapy-related nonischemic myopathy. Her most recent echo performed 02/27/12 revealed an ejection fraction of 45-50% with global hypokinesia and septal wall motion and a mallet consistent with a conduction abnormality. I did perform cardiac catheterization on her 12/18/02 revealing normal coronary arteries. She had an ICD implanted by Dr. Elvis Coil March of 2006 for primary prevention. She does complain of dyspnea on exertion which is chronic.      Lorretta Harp MD FACP,FACC,FAHA, [facility] 03/24/2014 10:26 AM

## 2014-03-24 NOTE — Patient Instructions (Signed)
Your physician wants you to follow-up in: 1 year with Dr Berry. You will receive a reminder letter in the mail two months in advance. If you don't receive a letter, please call our office to schedule the follow-up appointment.  

## 2014-03-24 NOTE — Assessment & Plan Note (Signed)
Controlled on current medications 

## 2014-03-24 NOTE — Assessment & Plan Note (Signed)
Followed by Dr. Sallyanne Kuster as an outpatient. He last saw her in the office 08/03/13. She apparently does quarterly downloads  at home.

## 2014-03-29 ENCOUNTER — Other Ambulatory Visit: Payer: Self-pay | Admitting: Family Medicine

## 2014-05-09 ENCOUNTER — Encounter (HOSPITAL_COMMUNITY): Payer: Self-pay | Admitting: Emergency Medicine

## 2014-05-09 ENCOUNTER — Emergency Department (HOSPITAL_COMMUNITY): Payer: Medicare Other

## 2014-05-09 ENCOUNTER — Inpatient Hospital Stay (HOSPITAL_COMMUNITY)
Admission: EM | Admit: 2014-05-09 | Discharge: 2014-05-14 | DRG: 480 | Disposition: A | Discharge status: Skilled Nursing Facility | Payer: Medicare Other | Attending: Family Medicine | Admitting: Family Medicine

## 2014-05-09 DIAGNOSIS — F339 Major depressive disorder, recurrent, unspecified: Secondary | ICD-10-CM | POA: Diagnosis present

## 2014-05-09 DIAGNOSIS — F17210 Nicotine dependence, cigarettes, uncomplicated: Secondary | ICD-10-CM | POA: Diagnosis present

## 2014-05-09 DIAGNOSIS — S199XXA Unspecified injury of neck, initial encounter: Secondary | ICD-10-CM | POA: Diagnosis not present

## 2014-05-09 DIAGNOSIS — I5032 Chronic diastolic (congestive) heart failure: Secondary | ICD-10-CM

## 2014-05-09 DIAGNOSIS — J45909 Unspecified asthma, uncomplicated: Secondary | ICD-10-CM | POA: Diagnosis present

## 2014-05-09 DIAGNOSIS — R55 Syncope and collapse: Secondary | ICD-10-CM

## 2014-05-09 DIAGNOSIS — I509 Heart failure, unspecified: Secondary | ICD-10-CM | POA: Diagnosis not present

## 2014-05-09 DIAGNOSIS — Z79899 Other long term (current) drug therapy: Secondary | ICD-10-CM

## 2014-05-09 DIAGNOSIS — W19XXXA Unspecified fall, initial encounter: Secondary | ICD-10-CM | POA: Diagnosis not present

## 2014-05-09 DIAGNOSIS — E780 Pure hypercholesterolemia: Secondary | ICD-10-CM | POA: Diagnosis present

## 2014-05-09 DIAGNOSIS — D62 Acute posthemorrhagic anemia: Secondary | ICD-10-CM | POA: Diagnosis not present

## 2014-05-09 DIAGNOSIS — E785 Hyperlipidemia, unspecified: Secondary | ICD-10-CM | POA: Diagnosis present

## 2014-05-09 DIAGNOSIS — S72009A Fracture of unspecified part of neck of unspecified femur, initial encounter for closed fracture: Secondary | ICD-10-CM

## 2014-05-09 DIAGNOSIS — Z95810 Presence of automatic (implantable) cardiac defibrillator: Secondary | ICD-10-CM | POA: Diagnosis not present

## 2014-05-09 DIAGNOSIS — S72101A Unspecified trochanteric fracture of right femur, initial encounter for closed fracture: Secondary | ICD-10-CM | POA: Diagnosis not present

## 2014-05-09 DIAGNOSIS — S72001J Fracture of unspecified part of neck of right femur, subsequent encounter for open fracture type IIIA, IIIB, or IIIC with delayed healing: Secondary | ICD-10-CM | POA: Diagnosis not present

## 2014-05-09 DIAGNOSIS — I428 Other cardiomyopathies: Secondary | ICD-10-CM

## 2014-05-09 DIAGNOSIS — F329 Major depressive disorder, single episode, unspecified: Secondary | ICD-10-CM | POA: Diagnosis not present

## 2014-05-09 DIAGNOSIS — J439 Emphysema, unspecified: Secondary | ICD-10-CM | POA: Diagnosis present

## 2014-05-09 DIAGNOSIS — R262 Difficulty in walking, not elsewhere classified: Secondary | ICD-10-CM | POA: Diagnosis not present

## 2014-05-09 DIAGNOSIS — Z7982 Long term (current) use of aspirin: Secondary | ICD-10-CM

## 2014-05-09 DIAGNOSIS — S72143A Displaced intertrochanteric fracture of unspecified femur, initial encounter for closed fracture: Secondary | ICD-10-CM | POA: Diagnosis not present

## 2014-05-09 DIAGNOSIS — Z9181 History of falling: Secondary | ICD-10-CM | POA: Diagnosis not present

## 2014-05-09 DIAGNOSIS — G4700 Insomnia, unspecified: Secondary | ICD-10-CM | POA: Diagnosis present

## 2014-05-09 DIAGNOSIS — Z66 Do not resuscitate: Secondary | ICD-10-CM | POA: Diagnosis present

## 2014-05-09 DIAGNOSIS — R404 Transient alteration of awareness: Secondary | ICD-10-CM | POA: Diagnosis not present

## 2014-05-09 DIAGNOSIS — S0990XA Unspecified injury of head, initial encounter: Secondary | ICD-10-CM | POA: Diagnosis not present

## 2014-05-09 DIAGNOSIS — I959 Hypotension, unspecified: Secondary | ICD-10-CM | POA: Diagnosis not present

## 2014-05-09 DIAGNOSIS — I10 Essential (primary) hypertension: Secondary | ICD-10-CM | POA: Diagnosis present

## 2014-05-09 DIAGNOSIS — S72141A Displaced intertrochanteric fracture of right femur, initial encounter for closed fracture: Secondary | ICD-10-CM | POA: Diagnosis not present

## 2014-05-09 DIAGNOSIS — R278 Other lack of coordination: Secondary | ICD-10-CM | POA: Diagnosis not present

## 2014-05-09 DIAGNOSIS — Z853 Personal history of malignant neoplasm of breast: Secondary | ICD-10-CM

## 2014-05-09 DIAGNOSIS — I517 Cardiomegaly: Secondary | ICD-10-CM | POA: Diagnosis not present

## 2014-05-09 DIAGNOSIS — M25551 Pain in right hip: Secondary | ICD-10-CM | POA: Diagnosis not present

## 2014-05-09 DIAGNOSIS — M6281 Muscle weakness (generalized): Secondary | ICD-10-CM | POA: Diagnosis not present

## 2014-05-09 DIAGNOSIS — Z01810 Encounter for preprocedural cardiovascular examination: Secondary | ICD-10-CM | POA: Diagnosis not present

## 2014-05-09 DIAGNOSIS — M8580 Other specified disorders of bone density and structure, unspecified site: Secondary | ICD-10-CM | POA: Diagnosis not present

## 2014-05-09 DIAGNOSIS — S72124D Nondisplaced fracture of lesser trochanter of right femur, subsequent encounter for closed fracture with routine healing: Secondary | ICD-10-CM | POA: Diagnosis not present

## 2014-05-09 DIAGNOSIS — S72001A Fracture of unspecified part of neck of right femur, initial encounter for closed fracture: Secondary | ICD-10-CM | POA: Diagnosis not present

## 2014-05-09 DIAGNOSIS — I429 Cardiomyopathy, unspecified: Secondary | ICD-10-CM | POA: Diagnosis not present

## 2014-05-09 DIAGNOSIS — E43 Unspecified severe protein-calorie malnutrition: Secondary | ICD-10-CM | POA: Insufficient documentation

## 2014-05-09 DIAGNOSIS — T148XXA Other injury of unspecified body region, initial encounter: Secondary | ICD-10-CM

## 2014-05-09 DIAGNOSIS — R42 Dizziness and giddiness: Secondary | ICD-10-CM | POA: Diagnosis not present

## 2014-05-09 DIAGNOSIS — M810 Age-related osteoporosis without current pathological fracture: Secondary | ICD-10-CM

## 2014-05-09 DIAGNOSIS — S79911A Unspecified injury of right hip, initial encounter: Secondary | ICD-10-CM | POA: Diagnosis not present

## 2014-05-09 LAB — I-STAT TROPONIN, ED: TROPONIN I, POC: 0 ng/mL (ref 0.00–0.08)

## 2014-05-09 LAB — CBC WITH DIFFERENTIAL/PLATELET
BASOS ABS: 0 10*3/uL (ref 0.0–0.1)
BASOS PCT: 0 % (ref 0–1)
EOS ABS: 0.1 10*3/uL (ref 0.0–0.7)
Eosinophils Relative: 2 % (ref 0–5)
HCT: 34.8 % — ABNORMAL LOW (ref 36.0–46.0)
Hemoglobin: 11.9 g/dL — ABNORMAL LOW (ref 12.0–15.0)
Lymphocytes Relative: 24 % (ref 12–46)
Lymphs Abs: 1.7 10*3/uL (ref 0.7–4.0)
MCH: 30.1 pg (ref 26.0–34.0)
MCHC: 34.2 g/dL (ref 30.0–36.0)
MCV: 87.9 fL (ref 78.0–100.0)
MONOS PCT: 9 % (ref 3–12)
Monocytes Absolute: 0.6 10*3/uL (ref 0.1–1.0)
NEUTROS ABS: 4.6 10*3/uL (ref 1.7–7.7)
NEUTROS PCT: 65 % (ref 43–77)
PLATELETS: 177 10*3/uL (ref 150–400)
RBC: 3.96 MIL/uL (ref 3.87–5.11)
RDW: 13.4 % (ref 11.5–15.5)
WBC: 7.1 10*3/uL (ref 4.0–10.5)

## 2014-05-09 LAB — BASIC METABOLIC PANEL
ANION GAP: 14 (ref 5–15)
BUN: 16 mg/dL (ref 6–23)
CALCIUM: 8.6 mg/dL (ref 8.4–10.5)
CO2: 24 mEq/L (ref 19–32)
Chloride: 96 mEq/L (ref 96–112)
Creatinine, Ser: 0.88 mg/dL (ref 0.50–1.10)
GFR calc non Af Amer: 60 mL/min — ABNORMAL LOW (ref >90)
GFR, EST AFRICAN AMERICAN: 70 mL/min — AB (ref >90)
Glucose, Bld: 119 mg/dL — ABNORMAL HIGH (ref 70–99)
Potassium: 3.9 mEq/L (ref 3.7–5.3)
Sodium: 134 mEq/L — ABNORMAL LOW (ref 137–147)

## 2014-05-09 LAB — ABO/RH: ABO/RH(D): O NEG

## 2014-05-09 LAB — TYPE AND SCREEN
ABO/RH(D): O NEG
Antibody Screen: NEGATIVE

## 2014-05-09 LAB — PROTIME-INR
INR: 1.17 (ref 0.00–1.49)
PROTHROMBIN TIME: 15 s (ref 11.6–15.2)

## 2014-05-09 MED ORDER — SODIUM CHLORIDE 0.9 % IV SOLN: 20.0000 mL | INTRAVENOUS | Status: DC

## 2014-05-09 MED ORDER — SODIUM CHLORIDE 0.9 % IV SOLN
INTRAVENOUS | Status: DC
  Administered 2014-05-09 – 2014-05-11 (×5): via INTRAVENOUS

## 2014-05-09 MED ORDER — FENTANYL CITRATE 0.05 MG/ML IJ SOLN
50.0000 ug | INTRAMUSCULAR | Status: AC | PRN
  Administered 2014-05-09 – 2014-05-10 (×2): 50 ug via INTRAVENOUS
  Filled 2014-05-09 (×2): qty 2

## 2014-05-09 MED ORDER — FENTANYL CITRATE 0.05 MG/ML IJ SOLN
50.0000 ug | INTRAMUSCULAR | Status: AC | PRN
  Administered 2014-05-09 (×2): 50 ug via INTRAVENOUS
  Filled 2014-05-09: qty 2

## 2014-05-09 NOTE — ED Notes (Signed)
Pt arrives via EMS from home, sudden onset dizziness and fall. Obvious deformity to R hip. 4oz wine PTA. Pedial pulses intact. 20 G IV placed in R AC. 8 MG zofran, 50 MCG fentanyl given PTA.

## 2014-05-09 NOTE — ED Provider Notes (Signed)
CSN: [id number]     Arrival date & time 05/09/14  2023 History   First MD Initiated Contact with Patient 05/09/14 2033     Chief Complaint  Patient presents with  . Hip Pain     (Consider location/radiation/quality/duration/timing/severity/associated sxs/prior Treatment) Patient is a 80 y.o. female presenting with hip pain. The history is provided by the patient.  Hip Pain This is a new problem. The current episode started today. The problem occurs constantly. The problem has been unchanged. Pertinent negatives include no abdominal pain, chest pain, congestion, coughing, diaphoresis, fatigue, fever, headaches, nausea, neck pain, rash, vertigo, visual change, vomiting or weakness. Nothing aggravates the symptoms. Treatments tried: EMS gave fentanyl in route. The treatment provided significant relief.    Past Medical History  Diagnosis Date  . Emphysema of lung   . Cancer     breast  . Depression   . CHF (congestive heart failure)   . Hypertension   . Hyperlipidemia   . Osteopenia   . Nonischemic cardiomyopathy   . Automatic implantable cardiac defibrillator in situ    Past Surgical History  Procedure Laterality Date  . Breast surgery      mastectomy  . Appendectomy  1943  . Tonsillectomy  1940  . Cardiac defibrillator placement  10/13/2004    Medtronic Maximo VR, model [id number], serial [id number]  . Pacemaker insertion  06/17/2010    Medtronic [place] VR, model #[id number], serial [id number]  . Cardiac catheterization  12/18/2002    Normal coronary arteries and normal LV function  . Cardiovascular stress test  12/08/2002    Non-diagnostic exercise stress test secondary to development of LBBB at peak exercise. Scintigraphic evidence of anterior wall thinning with suggestion of mild anterior and subtle lateral wall ischemaia.  . 2d echocardiogram  01/16/2013    EF 45-50%, moderate tricuspid valve regurg   Family History  Problem Relation Age of Onset  . Heart disease Mother   .  Heart disease Father   . Hyperlipidemia Sister   . Heart disease Brother   . Cancer Daughter     Oral cancer and "knot" on right cheek that was cancer  . Heart attack Maternal Grandmother   . Heart attack Maternal Grandfather   . Heart attack Paternal Grandmother   . Heart attack Paternal Grandfather    History  Substance Use Topics  . Smoking status: Current Some Day Smoker    Types: Cigarettes  . Smokeless tobacco: Never Used     Comment: Pt reports smoking about 1-2 times a week when stressed.   . Alcohol Use: 0.6 oz/week    1 Glasses of wine per week   OB History   Grav Para Term Preterm Abortions TAB SAB Ect Mult Living                 Review of Systems  Constitutional: Negative for fever, diaphoresis, activity change, appetite change and fatigue.  HENT: Negative for congestion and rhinorrhea.   Eyes: Negative for discharge, redness and itching.  Respiratory: Negative for cough, shortness of breath and wheezing.   Cardiovascular: Negative for chest pain.  Gastrointestinal: Negative for nausea, vomiting, abdominal pain and diarrhea.  Genitourinary: Negative for dysuria and hematuria.  Musculoskeletal: Positive for gait problem. Negative for back pain and neck pain.  Skin: Negative for rash and wound.  Neurological: Positive for syncope. Negative for vertigo, weakness and headaches.      Allergies  Atorvastatin; Claritin; and Crestor  Home Medications   Prior to Admission  medications   Medication Sig Start Date End Date Taking? Authorizing Provider  albuterol (PROAIR HFA) 108 (90 BASE) MCG/ACT inhaler Inhale 2 puffs into the lungs every 6 (six) hours as needed for wheezing or shortness of breath.   Yes Historical Provider, MD  aspirin 81 MG EC tablet Take 81 mg by mouth daily.     Yes Historical Provider, MD  Calcium Carbonate-Vitamin D (CALTRATE 600+D) 600-400 MG-UNIT per tablet Take 1 tablet by mouth daily.    Yes Historical Provider, MD  carvedilol (COREG) 25 MG  tablet Take 1 tablet (25 mg total) by mouth 2 (two) times daily with a meal. 12/22/13  Yes Mihai Croitoru, MD  enalapril (VASOTEC) 2.5 MG tablet Take 2 tablets (5 mg total) by mouth daily. 12/31/13  Yes Lorretta Harp, MD  FLUoxetine (PROZAC) 10 MG capsule Take 10 mg by mouth at bedtime.   Yes Historical Provider, MD  Fluticasone-Salmeterol (ADVAIR) 100-50 MCG/DOSE AEPB Inhale 1 puff into the lungs daily as needed (shortness of breath).   Yes Historical Provider, MD  furosemide (LASIX) 20 MG tablet Take 1 tablet (20 mg total) by mouth daily. 12/02/13  Yes Mihai Croitoru, MD  Ginkgo Biloba 40 MG TABS Take 1 tablet by mouth every evening.    Yes Historical Provider, MD  Multiple Vitamin (MULTI-VITAMIN DAILY PO) Take 1 tablet by mouth at bedtime.    Yes Historical Provider, MD   BP 119/49  Pulse 85  Temp(Src) 97.4 F (36.3 C) (Oral)  Resp 20  Ht 5\' 1"  (7.412 m)  Wt 118 lb (53.524 kg)  BMI 22.31 kg/m2  SpO2 98% Physical Exam  Constitutional: She is oriented to person, place, and time. She appears well-developed and well-nourished. No distress.  HENT:  Head: Normocephalic and atraumatic.  Mouth/Throat: Oropharynx is clear and moist. No oropharyngeal exudate.  No signs of any head trauma  Eyes: Conjunctivae and EOM are normal. Pupils are equal, round, and reactive to light. Right eye exhibits no discharge. Left eye exhibits no discharge. No scleral icterus.  Neck: Normal range of motion. Neck supple.  No cervical spine tenderness to palpation  Cardiovascular: Normal rate, regular rhythm and normal heart sounds.   No murmur heard. Pulmonary/Chest: Effort normal and breath sounds normal. No respiratory distress. She has no wheezes. She has no rales. She exhibits no tenderness.  Abdominal: Soft. She exhibits no distension and no mass. There is no tenderness.  Musculoskeletal:  R hip: Deformity present. Unable to arrange right hip. Diffuse tenderness palpation of right hip. No tenderness  palpation of knee, tib-fib, foot Neurovascularly intact Left hip normal Neurovascularly intact  Neurological: She is alert and oriented to person, place, and time. She exhibits normal muscle tone. Coordination normal.  Skin: Skin is warm. No rash noted. She is not diaphoretic.    ED Course  Procedures (including critical care time) Labs Review Labs Reviewed  BASIC METABOLIC PANEL - Abnormal; Notable for the following:    Sodium 134 (*)    Glucose, Bld 119 (*)    GFR calc non Af Amer 60 (*)    GFR calc Af Amer 70 (*)    All other components within normal limits  CBC WITH DIFFERENTIAL - Abnormal; Notable for the following:    Hemoglobin 11.9 (*)    HCT 34.8 (*)    All other components within normal limits  PROTIME-INR  I-STAT TROPOININ, ED  TYPE AND SCREEN  ABO/RH    Imaging Review Dg Chest 1 View  05/09/2014  CLINICAL DATA:  Right hip fracture following a fall tonight. Preop evaluation.  EXAM: CHEST - 1 VIEW  COMPARISON:  06/17/2010.  FINDINGS: Mildly enlarged cardiac silhouette with a mild increase in size. Stable right subclavian AICD leads. Mild diffuse increase in prominence of the interstitial markings. Diffuse osteopenia. Stable scoliosis. Stable left axillary surgical clips and surgically absent left breast.  IMPRESSION: 1. No acute abnormality. 2. Mildly progressive cardiomegaly. 3. Progressive chronic interstitial lung disease.   Electronically Signed   By: Enrique Sack M.D.   On: 05/09/2014 22:39   Dg Hip Complete Right  05/09/2014   CLINICAL DATA:  Patient fell backwards in her kitchen earlier tonight, right hip pain, initial evaluation  EXAM: RIGHT HIP - COMPLETE 2+ VIEW  COMPARISON:  None.  FINDINGS: Comminuted intertrochanteric right femur fracture with severe varus angulation.  IMPRESSION: Femur fracture   Electronically Signed   By: Skipper Cliche M.D.   On: 05/09/2014 22:37   Dg Femur Right  05/09/2014   CLINICAL DATA:  Fall is evening right hip pain initial  evaluation  EXAM: RIGHT FEMUR - 2 VIEW  COMPARISON:  None.  FINDINGS: Intertrochanteric right femur fracture, comminuted. Significant varus angulation. No other abnormalities involving the femur.  IMPRESSION: Right femur fracture   Electronically Signed   By: Skipper Cliche M.D.   On: 05/09/2014 22:38   Ct Head Wo Contrast  05/09/2014   CLINICAL DATA:  Patient at dizzy and fell backwards and hit back of head this evening, no loss of consciousness or headache, no neck pain, initial evaluation  EXAM: CT HEAD WITHOUT CONTRAST  CT CERVICAL SPINE WITHOUT CONTRAST  TECHNIQUE: Multidetector CT imaging of the head and cervical spine was performed following the standard protocol without intravenous contrast. Multiplanar CT image reconstructions of the cervical spine were also generated.  COMPARISON:  None.  FINDINGS: CT HEAD FINDINGS  Moderate diffuse atrophy. Mild low attenuation in the deep white matter. No evidence of vascular territory infarct or mass. No hemorrhage or extra-axial fluid. Calvarium intact.  CT CERVICAL SPINE FINDINGS  Calcification in the upper mediastinum likely partial volume averaging with calcified aortic arch. Extensive pleural parenchymal scarring in both lung apices. No evidence of cervical spine fracture. Normal anterior posterior cervical spine alignment. No prevertebral soft tissue swelling. Posterior elements C2 through C4 appear fused. Significant multilevel degenerative disc disease particularly in the central cervical spine.  IMPRESSION: No acute abnormality involving the cervical spine. No acute intracranial abnormality.   Electronically Signed   By: Skipper Cliche M.D.   On: 05/09/2014 22:44   Ct Cervical Spine Wo Contrast  05/09/2014   CLINICAL DATA:  Patient at dizzy and fell backwards and hit back of head this evening, no loss of consciousness or headache, no neck pain, initial evaluation  EXAM: CT HEAD WITHOUT CONTRAST  CT CERVICAL SPINE WITHOUT CONTRAST  TECHNIQUE:  Multidetector CT imaging of the head and cervical spine was performed following the standard protocol without intravenous contrast. Multiplanar CT image reconstructions of the cervical spine were also generated.  COMPARISON:  None.  FINDINGS: CT HEAD FINDINGS  Moderate diffuse atrophy. Mild low attenuation in the deep white matter. No evidence of vascular territory infarct or mass. No hemorrhage or extra-axial fluid. Calvarium intact.  CT CERVICAL SPINE FINDINGS  Calcification in the upper mediastinum likely partial volume averaging with calcified aortic arch. Extensive pleural parenchymal scarring in both lung apices. No evidence of cervical spine fracture. Normal anterior posterior cervical spine alignment. No prevertebral soft tissue  swelling. Posterior elements C2 through C4 appear fused. Significant multilevel degenerative disc disease particularly in the central cervical spine.  IMPRESSION: No acute abnormality involving the cervical spine. No acute intracranial abnormality.   Electronically Signed   By: Skipper Cliche M.D.   On: 05/09/2014 22:44     EKG Interpretation None      MDM   MDM: Patient is 80 year old female who comes in with right hip fracture after syncope. No symptoms prior syncope. Fell from standing and landed on right hip with subsequent deformity. X-ray shows fracture. Neurovascularly intact. EKG shows borderline prolonged QTC. Patient having hip pain but no other symptoms while in emergency department. Denies chest pain or shortness of breath prior or after fall. Patient did hit head since elderly we will obtain head and neck scan which are negative Patient's age and history makes her at risk for cardiogenic syncope. Ortho consult and will see on admission. Family medicine clinic. Patient given fentanyl in ED with good treatment of pain. Admit   Final diagnoses:  Hip fx  Syncope, unspecified syncope type    Admit  Sol Passer, MD 05/10/14 [phone]

## 2014-05-10 ENCOUNTER — Encounter (HOSPITAL_COMMUNITY): Payer: Self-pay | Admitting: Orthopedic Surgery

## 2014-05-10 ENCOUNTER — Encounter (HOSPITAL_COMMUNITY)
Admission: EM | Disposition: A | Discharge status: Skilled Nursing Facility | Payer: Medicare Other | Source: Home / Self Care | Attending: Family Medicine

## 2014-05-10 ENCOUNTER — Inpatient Hospital Stay (HOSPITAL_COMMUNITY): Payer: Medicare Other

## 2014-05-10 ENCOUNTER — Encounter (HOSPITAL_COMMUNITY): Payer: Medicare Other | Admitting: Anesthesiology

## 2014-05-10 ENCOUNTER — Inpatient Hospital Stay (HOSPITAL_COMMUNITY): Payer: Medicare Other | Admitting: Anesthesiology

## 2014-05-10 DIAGNOSIS — I10 Essential (primary) hypertension: Secondary | ICD-10-CM | POA: Diagnosis present

## 2014-05-10 DIAGNOSIS — F17210 Nicotine dependence, cigarettes, uncomplicated: Secondary | ICD-10-CM | POA: Diagnosis present

## 2014-05-10 DIAGNOSIS — M6281 Muscle weakness (generalized): Secondary | ICD-10-CM | POA: Diagnosis not present

## 2014-05-10 DIAGNOSIS — S79911A Unspecified injury of right hip, initial encounter: Secondary | ICD-10-CM | POA: Diagnosis not present

## 2014-05-10 DIAGNOSIS — S72009A Fracture of unspecified part of neck of unspecified femur, initial encounter for closed fracture: Secondary | ICD-10-CM | POA: Diagnosis present

## 2014-05-10 DIAGNOSIS — E43 Unspecified severe protein-calorie malnutrition: Secondary | ICD-10-CM | POA: Diagnosis not present

## 2014-05-10 DIAGNOSIS — Z01810 Encounter for preprocedural cardiovascular examination: Secondary | ICD-10-CM

## 2014-05-10 DIAGNOSIS — F329 Major depressive disorder, single episode, unspecified: Secondary | ICD-10-CM | POA: Diagnosis not present

## 2014-05-10 DIAGNOSIS — S72143A Displaced intertrochanteric fracture of unspecified femur, initial encounter for closed fracture: Secondary | ICD-10-CM | POA: Diagnosis present

## 2014-05-10 DIAGNOSIS — E780 Pure hypercholesterolemia: Secondary | ICD-10-CM | POA: Diagnosis present

## 2014-05-10 DIAGNOSIS — D62 Acute posthemorrhagic anemia: Secondary | ICD-10-CM | POA: Diagnosis not present

## 2014-05-10 DIAGNOSIS — S72001J Fracture of unspecified part of neck of right femur, subsequent encounter for open fracture type IIIA, IIIB, or IIIC with delayed healing: Secondary | ICD-10-CM | POA: Diagnosis not present

## 2014-05-10 DIAGNOSIS — Z7982 Long term (current) use of aspirin: Secondary | ICD-10-CM | POA: Diagnosis not present

## 2014-05-10 DIAGNOSIS — R278 Other lack of coordination: Secondary | ICD-10-CM | POA: Diagnosis not present

## 2014-05-10 DIAGNOSIS — M25551 Pain in right hip: Secondary | ICD-10-CM | POA: Diagnosis not present

## 2014-05-10 DIAGNOSIS — I429 Cardiomyopathy, unspecified: Secondary | ICD-10-CM | POA: Diagnosis not present

## 2014-05-10 DIAGNOSIS — J439 Emphysema, unspecified: Secondary | ICD-10-CM | POA: Diagnosis present

## 2014-05-10 DIAGNOSIS — S72101A Unspecified trochanteric fracture of right femur, initial encounter for closed fracture: Secondary | ICD-10-CM | POA: Diagnosis not present

## 2014-05-10 DIAGNOSIS — W19XXXA Unspecified fall, initial encounter: Secondary | ICD-10-CM | POA: Diagnosis not present

## 2014-05-10 DIAGNOSIS — J45909 Unspecified asthma, uncomplicated: Secondary | ICD-10-CM | POA: Diagnosis present

## 2014-05-10 DIAGNOSIS — R262 Difficulty in walking, not elsewhere classified: Secondary | ICD-10-CM | POA: Diagnosis not present

## 2014-05-10 DIAGNOSIS — G4700 Insomnia, unspecified: Secondary | ICD-10-CM | POA: Diagnosis present

## 2014-05-10 DIAGNOSIS — Z9181 History of falling: Secondary | ICD-10-CM | POA: Diagnosis not present

## 2014-05-10 DIAGNOSIS — I509 Heart failure, unspecified: Secondary | ICD-10-CM | POA: Diagnosis not present

## 2014-05-10 DIAGNOSIS — Z95810 Presence of automatic (implantable) cardiac defibrillator: Secondary | ICD-10-CM | POA: Diagnosis not present

## 2014-05-10 DIAGNOSIS — Z853 Personal history of malignant neoplasm of breast: Secondary | ICD-10-CM | POA: Diagnosis not present

## 2014-05-10 DIAGNOSIS — Z66 Do not resuscitate: Secondary | ICD-10-CM | POA: Diagnosis present

## 2014-05-10 DIAGNOSIS — M8580 Other specified disorders of bone density and structure, unspecified site: Secondary | ICD-10-CM | POA: Diagnosis not present

## 2014-05-10 DIAGNOSIS — E785 Hyperlipidemia, unspecified: Secondary | ICD-10-CM | POA: Diagnosis present

## 2014-05-10 DIAGNOSIS — Z79899 Other long term (current) drug therapy: Secondary | ICD-10-CM | POA: Diagnosis not present

## 2014-05-10 DIAGNOSIS — I5032 Chronic diastolic (congestive) heart failure: Secondary | ICD-10-CM | POA: Diagnosis not present

## 2014-05-10 DIAGNOSIS — S72141A Displaced intertrochanteric fracture of right femur, initial encounter for closed fracture: Secondary | ICD-10-CM | POA: Diagnosis not present

## 2014-05-10 DIAGNOSIS — S72124D Nondisplaced fracture of lesser trochanter of right femur, subsequent encounter for closed fracture with routine healing: Secondary | ICD-10-CM | POA: Diagnosis not present

## 2014-05-10 DIAGNOSIS — F339 Major depressive disorder, recurrent, unspecified: Secondary | ICD-10-CM | POA: Diagnosis present

## 2014-05-10 DIAGNOSIS — I959 Hypotension, unspecified: Secondary | ICD-10-CM | POA: Diagnosis not present

## 2014-05-10 HISTORY — PX: FEMUR IM NAIL: SHX1597

## 2014-05-10 LAB — CBC
HCT: 32 % — ABNORMAL LOW (ref 36.0–46.0)
HCT: 28.9 % — ABNORMAL LOW (ref 36.0–46.0)
Hemoglobin: 10.9 g/dL — ABNORMAL LOW (ref 12.0–15.0)
Hemoglobin: 9.7 g/dL — ABNORMAL LOW (ref 12.0–15.0)
MCH: 30.4 pg (ref 26.0–34.0)
MCH: 30.1 pg (ref 26.0–34.0)
MCHC: 34.1 g/dL (ref 30.0–36.0)
MCHC: 33.6 g/dL (ref 30.0–36.0)
MCV: 89.1 fL (ref 78.0–100.0)
MCV: 89.8 fL (ref 78.0–100.0)
PLATELETS: 150 10*3/uL (ref 150–400)
Platelets: 166 10*3/uL (ref 150–400)
RBC: 3.59 MIL/uL — AB (ref 3.87–5.11)
RBC: 3.22 MIL/uL — AB (ref 3.87–5.11)
RDW: 13.5 % (ref 11.5–15.5)
RDW: 13.7 % (ref 11.5–15.5)
WBC: 9.2 10*3/uL (ref 4.0–10.5)
WBC: 10.2 10*3/uL (ref 4.0–10.5)

## 2014-05-10 LAB — BASIC METABOLIC PANEL
ANION GAP: 11 (ref 5–15)
BUN: 20 mg/dL (ref 6–23)
CALCIUM: 8.3 mg/dL — AB (ref 8.4–10.5)
CO2: 25 mEq/L (ref 19–32)
Chloride: 99 mEq/L (ref 96–112)
Creatinine, Ser: 0.86 mg/dL (ref 0.50–1.10)
GFR calc non Af Amer: 62 mL/min — ABNORMAL LOW (ref >90)
GFR, EST AFRICAN AMERICAN: 71 mL/min — AB (ref >90)
GLUCOSE: 169 mg/dL — AB (ref 70–99)
POTASSIUM: 4.6 meq/L (ref 3.7–5.3)
SODIUM: 135 meq/L — AB (ref 137–147)

## 2014-05-10 LAB — CREATININE, SERUM
Creatinine, Ser: 0.84 mg/dL (ref 0.50–1.10)
GFR calc Af Amer: 74 mL/min — ABNORMAL LOW (ref >90)
GFR calc non Af Amer: 63 mL/min — ABNORMAL LOW (ref >90)

## 2014-05-10 LAB — SURGICAL PCR SCREEN
MRSA, PCR: NEGATIVE
Staphylococcus aureus: NEGATIVE

## 2014-05-10 SURGERY — INSERTION, INTRAMEDULLARY ROD, FEMUR
Anesthesia: Spinal | Laterality: Right

## 2014-05-10 MED ORDER — OXYCODONE HCL 5 MG PO TABS: 5.0000 mg | ORAL_TABLET | ORAL | Status: DC | PRN

## 2014-05-10 MED ORDER — FLUOXETINE HCL 10 MG PO CAPS
10.0000 mg | ORAL_CAPSULE | Freq: Every day | ORAL | Status: DC
  Administered 2014-05-10 – 2014-05-13 (×4): 10 mg via ORAL
  Filled 2014-05-10 (×6): qty 1

## 2014-05-10 MED ORDER — PHENYLEPHRINE HCL 10 MG/ML IJ SOLN
INTRAMUSCULAR | Status: DC | PRN
  Administered 2014-05-10 (×2): 40 ug via INTRAVENOUS

## 2014-05-10 MED ORDER — ALUM & MAG HYDROXIDE-SIMETH 200-200-20 MG/5ML PO SUSP: 30.0000 mL | ORAL | Status: DC | PRN

## 2014-05-10 MED ORDER — ALBUTEROL SULFATE HFA 108 (90 BASE) MCG/ACT IN AERS
INHALATION_SPRAY | RESPIRATORY_TRACT | Status: DC | PRN
  Administered 2014-05-10: 2 via RESPIRATORY_TRACT

## 2014-05-10 MED ORDER — POLYETHYLENE GLYCOL 3350 17 G PO PACK: 17.0000 g | PACK | Freq: Every day | ORAL | Status: DC | PRN

## 2014-05-10 MED ORDER — ALBUTEROL SULFATE (2.5 MG/3ML) 0.083% IN NEBU: 2.5000 mg | INHALATION_SOLUTION | Freq: Four times a day (QID) | RESPIRATORY_TRACT | Status: DC | PRN

## 2014-05-10 MED ORDER — ARTIFICIAL TEARS OP OINT
TOPICAL_OINTMENT | OPHTHALMIC | Status: AC
  Filled 2014-05-10: qty 3.5

## 2014-05-10 MED ORDER — EPHEDRINE SULFATE 50 MG/ML IJ SOLN
INTRAMUSCULAR | Status: DC | PRN
  Administered 2014-05-10: 10 mg via INTRAVENOUS
  Administered 2014-05-10: 5 mg via INTRAVENOUS
  Administered 2014-05-10: 10 mg via INTRAVENOUS

## 2014-05-10 MED ORDER — CALCIUM CARBONATE-VITAMIN D 500-200 MG-UNIT PO TABS
1.0000 | ORAL_TABLET | Freq: Every day | ORAL | Status: DC
  Administered 2014-05-11 – 2014-05-14 (×4): 1 via ORAL
  Filled 2014-05-10 (×6): qty 1

## 2014-05-10 MED ORDER — FENTANYL CITRATE 0.05 MG/ML IJ SOLN
INTRAMUSCULAR | Status: DC | PRN
  Administered 2014-05-10: 50 ug via INTRAVENOUS

## 2014-05-10 MED ORDER — SENNA 8.6 MG PO TABS
1.0000 | ORAL_TABLET | Freq: Two times a day (BID) | ORAL | Status: DC
  Administered 2014-05-10 – 2014-05-14 (×8): 8.6 mg via ORAL
  Filled 2014-05-10 (×9): qty 1

## 2014-05-10 MED ORDER — ENOXAPARIN SODIUM 40 MG/0.4ML ~~LOC~~ SOLN: 40.0000 mg | Freq: Every day | SUBCUTANEOUS | Status: DC

## 2014-05-10 MED ORDER — CEFAZOLIN SODIUM-DEXTROSE 2-3 GM-% IV SOLR
2.0000 g | Freq: Four times a day (QID) | INTRAVENOUS | Status: AC
  Administered 2014-05-10 – 2014-05-11 (×3): 2 g via INTRAVENOUS
  Filled 2014-05-10 (×3): qty 50

## 2014-05-10 MED ORDER — ONDANSETRON HCL 4 MG/2ML IJ SOLN: 4.0000 mg | Freq: Four times a day (QID) | INTRAMUSCULAR | Status: DC | PRN

## 2014-05-10 MED ORDER — HYDROCODONE-ACETAMINOPHEN 5-325 MG PO TABS
1.0000 | ORAL_TABLET | Freq: Four times a day (QID) | ORAL | Status: DC | PRN
  Administered 2014-05-11 (×3): 2 via ORAL
  Filled 2014-05-10 (×3): qty 2

## 2014-05-10 MED ORDER — ACETAMINOPHEN 650 MG RE SUPP: 650.0000 mg | Freq: Four times a day (QID) | RECTAL | Status: DC | PRN

## 2014-05-10 MED ORDER — MORPHINE SULFATE 2 MG/ML IJ SOLN
0.5000 mg | INTRAMUSCULAR | Status: DC | PRN
Start: 2014-05-10 — End: 2014-05-14

## 2014-05-10 MED ORDER — STERILE WATER FOR INJECTION IJ SOLN
INTRAMUSCULAR | Status: AC
  Filled 2014-05-10: qty 20

## 2014-05-10 MED ORDER — CHLORHEXIDINE GLUCONATE 4 % EX LIQD
60.0000 mL | Freq: Once | CUTANEOUS | Status: DC
  Filled 2014-05-10: qty 60

## 2014-05-10 MED ORDER — ENOXAPARIN SODIUM 40 MG/0.4ML ~~LOC~~ SOLN
40.0000 mg | SUBCUTANEOUS | Status: DC
  Administered 2014-05-11 – 2014-05-14 (×4): 40 mg via SUBCUTANEOUS
  Filled 2014-05-10 (×4): qty 0.4

## 2014-05-10 MED ORDER — METHOCARBAMOL 1000 MG/10ML IJ SOLN
500.0000 mg | Freq: Four times a day (QID) | INTRAVENOUS | Status: DC | PRN
  Filled 2014-05-10: qty 5

## 2014-05-10 MED ORDER — DEXTROSE 5 % IV SOLN
INTRAVENOUS | Status: DC | PRN
  Administered 2014-05-10: 16:00:00 via INTRAVENOUS

## 2014-05-10 MED ORDER — ONDANSETRON HCL 4 MG PO TABS: 4.0000 mg | ORAL_TABLET | Freq: Four times a day (QID) | ORAL | Status: DC | PRN

## 2014-05-10 MED ORDER — PHENOL 1.4 % MT LIQD: 1.0000 | OROMUCOSAL | Status: DC | PRN

## 2014-05-10 MED ORDER — OXYCODONE HCL 5 MG PO TABS
5.0000 mg | ORAL_TABLET | ORAL | Status: DC | PRN
  Administered 2014-05-10 – 2014-05-11 (×2): 10 mg via ORAL
  Administered 2014-05-11: 5 mg via ORAL
  Filled 2014-05-10: qty 2
  Filled 2014-05-10: qty 1

## 2014-05-10 MED ORDER — FENTANYL CITRATE 0.05 MG/ML IJ SOLN
INTRAMUSCULAR | Status: AC
  Filled 2014-05-10: qty 5

## 2014-05-10 MED ORDER — ACETAMINOPHEN 325 MG PO TABS
650.0000 mg | ORAL_TABLET | Freq: Four times a day (QID) | ORAL | Status: DC | PRN
  Administered 2014-05-10: 650 mg via ORAL

## 2014-05-10 MED ORDER — LIDOCAINE HCL (CARDIAC) 20 MG/ML IV SOLN
INTRAVENOUS | Status: DC | PRN
  Administered 2014-05-10: 40 mg via INTRAVENOUS

## 2014-05-10 MED ORDER — MENTHOL 3 MG MT LOZG: 1.0000 | LOZENGE | OROMUCOSAL | Status: DC | PRN

## 2014-05-10 MED ORDER — SODIUM CHLORIDE 0.9 % IV SOLN
INTRAVENOUS | Status: DC
  Administered 2014-05-11: 15:00:00 via INTRAVENOUS

## 2014-05-10 MED ORDER — ROCURONIUM BROMIDE 50 MG/5ML IV SOLN
INTRAVENOUS | Status: AC
  Filled 2014-05-10: qty 1

## 2014-05-10 MED ORDER — METOCLOPRAMIDE HCL 5 MG/ML IJ SOLN: 5.0000 mg | Freq: Three times a day (TID) | INTRAMUSCULAR | Status: DC | PRN

## 2014-05-10 MED ORDER — ONDANSETRON HCL 4 MG/2ML IJ SOLN
4.0000 mg | Freq: Once | INTRAMUSCULAR | Status: AC | PRN
Start: 2014-05-10 — End: 2014-05-10

## 2014-05-10 MED ORDER — METHOCARBAMOL 500 MG PO TABS
ORAL_TABLET | ORAL | Status: AC
  Administered 2014-05-10: 500 mg via ORAL
  Filled 2014-05-10: qty 1

## 2014-05-10 MED ORDER — FENTANYL CITRATE 0.05 MG/ML IJ SOLN
50.0000 ug | INTRAMUSCULAR | Status: DC | PRN
Start: 2014-05-10 — End: 2014-05-11
  Administered 2014-05-10: 50 ug via INTRAVENOUS
  Filled 2014-05-10: qty 2

## 2014-05-10 MED ORDER — ACETAMINOPHEN 325 MG PO TABS
ORAL_TABLET | ORAL | Status: AC
  Administered 2014-05-10: 650 mg via ORAL
  Filled 2014-05-10: qty 2

## 2014-05-10 MED ORDER — SORBITOL 70 % SOLN: 30.0000 mL | Freq: Every day | Status: DC | PRN

## 2014-05-10 MED ORDER — MOMETASONE FURO-FORMOTEROL FUM 100-5 MCG/ACT IN AERO
2.0000 | INHALATION_SPRAY | Freq: Two times a day (BID) | RESPIRATORY_TRACT | Status: DC
  Administered 2014-05-10 – 2014-05-14 (×8): 2 via RESPIRATORY_TRACT
  Filled 2014-05-10 (×2): qty 8.8

## 2014-05-10 MED ORDER — SODIUM CHLORIDE 0.9 % IV SOLN: INTRAVENOUS | Status: DC

## 2014-05-10 MED ORDER — ALBUTEROL SULFATE HFA 108 (90 BASE) MCG/ACT IN AERS
INHALATION_SPRAY | RESPIRATORY_TRACT | Status: AC
  Filled 2014-05-10: qty 6.7

## 2014-05-10 MED ORDER — OXYCODONE HCL 5 MG PO TABS
ORAL_TABLET | ORAL | Status: AC
  Administered 2014-05-10: 10 mg via ORAL
  Filled 2014-05-10: qty 2

## 2014-05-10 MED ORDER — MAGNESIUM CITRATE PO SOLN: 1.0000 | Freq: Once | ORAL | Status: AC | PRN

## 2014-05-10 MED ORDER — ONDANSETRON HCL 4 MG/2ML IJ SOLN
INTRAMUSCULAR | Status: DC | PRN
  Administered 2014-05-10: 4 mg via INTRAVENOUS

## 2014-05-10 MED ORDER — SODIUM CHLORIDE 0.9 % IJ SOLN
3.0000 mL | Freq: Two times a day (BID) | INTRAMUSCULAR | Status: DC
  Administered 2014-05-10 – 2014-05-12 (×4): 3 mL via INTRAVENOUS

## 2014-05-10 MED ORDER — CEFAZOLIN SODIUM-DEXTROSE 2-3 GM-% IV SOLR
2.0000 g | INTRAVENOUS | Status: AC
  Administered 2014-05-10: 2 g via INTRAVENOUS
  Filled 2014-05-10: qty 50

## 2014-05-10 MED ORDER — CARVEDILOL 12.5 MG PO TABS
12.5000 mg | ORAL_TABLET | Freq: Two times a day (BID) | ORAL | Status: DC
  Administered 2014-05-10 – 2014-05-13 (×6): 12.5 mg via ORAL
  Filled 2014-05-10 (×9): qty 1

## 2014-05-10 MED ORDER — SUCCINYLCHOLINE CHLORIDE 20 MG/ML IJ SOLN
INTRAMUSCULAR | Status: AC
Start: 2014-05-10 — End: 2014-05-10
  Filled 2014-05-10: qty 1

## 2014-05-10 MED ORDER — FENTANYL CITRATE 0.05 MG/ML IJ SOLN: 25.0000 ug | INTRAMUSCULAR | Status: DC | PRN

## 2014-05-10 MED ORDER — PROPOFOL 10 MG/ML IV BOLUS
INTRAVENOUS | Status: DC | PRN
  Administered 2014-05-10 (×5): 20 mg via INTRAVENOUS

## 2014-05-10 MED ORDER — METOCLOPRAMIDE HCL 10 MG PO TABS: 5.0000 mg | ORAL_TABLET | Freq: Three times a day (TID) | ORAL | Status: DC | PRN

## 2014-05-10 MED ORDER — METHOCARBAMOL 500 MG PO TABS
500.0000 mg | ORAL_TABLET | Freq: Four times a day (QID) | ORAL | Status: DC | PRN
  Administered 2014-05-10 – 2014-05-11 (×3): 500 mg via ORAL
  Filled 2014-05-10 (×2): qty 1

## 2014-05-10 MED ORDER — CEFAZOLIN SODIUM-DEXTROSE 2-3 GM-% IV SOLR
INTRAVENOUS | Status: AC
  Filled 2014-05-10: qty 50

## 2014-05-10 SURGICAL SUPPLY — 43 items
BNDG COHESIVE 4X5 TAN STRL (GAUZE/BANDAGES/DRESSINGS) ×3 IMPLANT
COVER PERINEAL POST (MISCELLANEOUS) ×3 IMPLANT
COVER SURGICAL LIGHT HANDLE (MISCELLANEOUS) ×3 IMPLANT
DRAPE C-ARM 42X72 X-RAY (DRAPES) ×3 IMPLANT
DRAPE C-ARMOR (DRAPES) ×3 IMPLANT
DRAPE INCISE IOBAN 66X45 STRL (DRAPES) ×3 IMPLANT
DRAPE ORTHO SPLIT 77X108 STRL (DRAPES) ×6
DRAPE PROXIMA HALF (DRAPES) ×6 IMPLANT
DRAPE SURG ORHT 6 SPLT 77X108 (DRAPES) ×2 IMPLANT
DRAPE U-SHAPE 47X51 STRL (DRAPES) ×3 IMPLANT
DRSG EMULSION OIL 3X3 NADH (GAUZE/BANDAGES/DRESSINGS) ×3 IMPLANT
DRSG MEPILEX BORDER 4X4 (GAUZE/BANDAGES/DRESSINGS) ×6 IMPLANT
DRSG MEPILEX BORDER 4X8 (GAUZE/BANDAGES/DRESSINGS) ×3 IMPLANT
DURAPREP 26ML APPLICATOR (WOUND CARE) ×3 IMPLANT
ELECT CAUTERY BLADE 6.4 (BLADE) ×3 IMPLANT
ELECT REM PT RETURN 9FT ADLT (ELECTROSURGICAL) ×3
ELECTRODE REM PT RTRN 9FT ADLT (ELECTROSURGICAL) ×1 IMPLANT
FACESHIELD WRAPAROUND (MASK) ×6 IMPLANT
FACESHIELD WRAPAROUND OR TEAM (MASK) ×2 IMPLANT
GLOVE SURG SYN 7.5  E (GLOVE) ×4
GLOVE SURG SYN 7.5 E (GLOVE) ×2 IMPLANT
GLOVE SURG SYN 7.5 PF PI (GLOVE) ×2 IMPLANT
GOWN STRL REIN XL XLG (GOWN DISPOSABLE) ×3 IMPLANT
GOWN STRL REUS W/ TWL LRG LVL3 (GOWN DISPOSABLE) ×3 IMPLANT
GOWN STRL REUS W/TWL LRG LVL3 (GOWN DISPOSABLE) ×9
GUIDE PIN 3.2MM (MISCELLANEOUS) ×3
GUIDE PIN ORTH 343X3.2XBRAD (MISCELLANEOUS) IMPLANT
KIT BASIN OR (CUSTOM PROCEDURE TRAY) ×3 IMPLANT
KIT ROOM TURNOVER OR (KITS) ×3 IMPLANT
LINER BOOT UNIVERSAL DISP (MISCELLANEOUS) ×3 IMPLANT
MANIFOLD NEPTUNE II (INSTRUMENTS) ×3 IMPLANT
NAIL RIGHT 10X38MM (Nail) ×2 IMPLANT
NS IRRIG 1000ML POUR BTL (IV SOLUTION) ×3 IMPLANT
PACK GENERAL/GYN (CUSTOM PROCEDURE TRAY) ×3 IMPLANT
PAD ARMBOARD 7.5X6 YLW CONV (MISCELLANEOUS) ×6 IMPLANT
SCREW LAG COMPR KIT 95/90 (Screw) ×2 IMPLANT
STAPLER VISISTAT 35W (STAPLE) IMPLANT
STOCKINETTE IMPERVIOUS 9X36 MD (GAUZE/BANDAGES/DRESSINGS) ×3 IMPLANT
SUT VIC AB 0 CT1 27 (SUTURE) ×3
SUT VIC AB 0 CT1 27XBRD ANBCTR (SUTURE) ×1 IMPLANT
SUT VIC AB 2-0 CT1 27 (SUTURE) ×6
SUT VIC AB 2-0 CT1 TAPERPNT 27 (SUTURE) ×2 IMPLANT
WATER STERILE IRR 1000ML POUR (IV SOLUTION) ×6 IMPLANT

## 2014-05-10 NOTE — ED Provider Notes (Signed)
Medical screening examination/treatment/procedure(s) were conducted as a shared visit with non-physician practitioner(s) or resident  and myself.  I personally evaluated the patient during the encounter and agree with the findings.   I have personally reviewed any xrays and/ or EKG's with the provider and I agree with interpretation.   Patient with right hip pain since fall prior to arrival. Patient unsure why she fell, possibly syncope, possibly dizziness. On exam patient is significant right lateral hip tenderness, neurovascular intact distal leg, abdomen soft nontender, cranial nerves grossly intact, neck supple. X-ray reviewed, intertrochanteric hip fracture/femur fracture. Patient admitted for further evaluation of reason for falling and repair of the right hip.  Intra-trochanteric right femur fracture closed, fall  Mariea Clonts, MD 05/10/14 [phone]

## 2014-05-10 NOTE — Progress Notes (Signed)
PT Cancellation Note  Patient Details Name: Brianna [name] MRN: [id number] DOB: [birth year]   Cancelled Treatment:    Reason Eval/Treat Not Completed: Medical issues which prohibited therapy  Will plan to see [name] for PT eval postop;  Thanks,  Roney Marion, PT  Acute Rehabilitation Services Pager [phone] Office [phone]    [place], [place] 05/10/2014, 8:06 AM

## 2014-05-10 NOTE — Progress Notes (Signed)
S: Pt doing well, pain moderately controlled.  Awaiting OR.  Objective:  Filed Vitals:   05/10/14 0553  BP: 124/77  Pulse: 87  Temp: 97.4 F (36.3 C)  Resp: 16   MSK: R hip ER/Shortened   A/P 1) R intertrochanteric Hip Fx  - Discuss cardiovascular clearance with cardiology  - Plan per ortho   Symphani Eckstrom R. Awanda Mink, DO of [facility] 05/10/2014, 9:27 AM

## 2014-05-10 NOTE — Op Note (Signed)
   Date of Surgery: 05/10/2014  INDICATIONS: Brianna [name] is a 80 y.o.-year-old female who was involved in a mechanical fall and sustained a right hip fracture (pertrochanteric-type). The risks and benefits of the procedure discussed with the patient and family prior to the procedure and all questions were answered; consent was obtained.  PREOPERATIVE DIAGNOSIS: right hip fracture (intertrochanteric-type)   POSTOPERATIVE DIAGNOSIS: Same   PROCEDURE: Treatment of intertrochanteric fracture with intramedullary implant. CPT [phone]   SURGEON: N. Eduard Roux, M.D.   ANESTHESIA: general   IV FLUIDS AND URINE: See anesthesia record   ESTIMATED BLOOD LOSS: 200 cc  IMPLANTS: [employer] InterTAN 10 x 38, 95/90 lag screws  DRAINS: None.   COMPLICATIONS: None.   DESCRIPTION OF PROCEDURE: The patient was brought to the operating room and placed supine on the operating table. The patient's leg had been signed prior to the procedure. The patient had the anesthesia placed by the anesthesiologist. The prep verification and incision time-outs were performed to confirm that this was the correct patient, site, side and location. The patient had an SCD on the opposite lower extremity. The patient did receive antibiotics prior to the incision and was re-dosed during the procedure as needed at indicated intervals. The patient was positioned on the fracture table with the table in traction and internal rotation to reduce the hip. The well leg was placed in a hemi-lithotomy position and all bony prominences were well-padded. The patient had the lower extremity prepped and draped in the standard surgical fashion. The incision was made 4 finger breadths superior to the greater trochanter. A guide pin was inserted into the tip of the greater trochanter under fluoroscopic guidance. An opening reamer was used to gain access to the femoral canal. The nail length was measured and inserted down the femoral canal to its  proper depth. The appropriate version of insertion for the lag screw was found under fluoroscopy. A pin was inserted up the femoral neck through the jig. Then, a second antirotation pin was inserted inferior to the first pin. The length of the lag screw was then measured. The lag screw was inserted as near to center-center in the head as possible. The antirotation pin was then taken out and an interdigitating compression screw was placed in its place. The leg was taken out of traction, then the interdigitating compression screw was used to compress across the fracture. Compression was visualized on serial xrays. The wound was copiously irrigated with saline and the subcutaneous layer closed with 2.0 vicryl and the skin was reapproximated with staples. The wounds were cleaned and dried a final time and a sterile dressing was placed. The hip was taken through a range of motion at the end of the case under fluoroscopic imaging to visualize the approach-withdraw phenomenon and confirm implant length in the head. The patient was then awakened from anesthesia and taken to the recovery room in stable condition. All counts were correct at the end of the case.   POSTOPERATIVE PLAN: The patient will be weight bearing as tolerated and will return in 2 weeks for staple removal and the patient will receive DVT prophylaxis based on other medications, activity level, and risk ratio of bleeding to thrombosis.   Brianna Cecil, MD [place] 5:19 PM

## 2014-05-10 NOTE — H&P (Signed)
Seen and examined.  Agree with the documentation and management of Dr. Darene Lamer.  Briefly, unfortunate woman who lost balance, fell and broke her hip.  She will need surgical repair.  Given her significant cardiac history, I believe it is prudent to get cards involved in her pre and post op care.

## 2014-05-10 NOTE — Anesthesia Preprocedure Evaluation (Signed)
Anesthesia Evaluation  Patient identified by MRN, date of birth, ID band Patient awake    Reviewed: Allergy & Precautions, H&P , NPO status , Patient's Chart, lab work & pertinent test results, reviewed documented beta blocker date and time   Airway Mallampati: II TM Distance: >3 FB Neck ROM: Full    Dental  (+) Edentulous Upper, Partial Lower   Pulmonary Current Smoker,  + rhonchi   + wheezing      Cardiovascular hypertension, Rhythm:Regular Rate:Normal     Neuro/Psych    GI/Hepatic   Endo/Other    Renal/GU      Musculoskeletal   Abdominal   Peds  Hematology   Anesthesia Other Findings   Reproductive/Obstetrics                           Anesthesia Physical Anesthesia Plan  ASA: III  Anesthesia Plan: Spinal   Post-op Pain Management:    Induction:   Airway Management Planned: Natural Airway and Simple Face Mask  Additional Equipment:   Intra-op Plan:   Post-operative Plan:   Informed Consent: I have reviewed the patients History and Physical, chart, labs and discussed the procedure including the risks, benefits and alternatives for the proposed anesthesia with the patient or authorized representative who has indicated his/her understanding and acceptance.     Plan Discussed with: CRNA and Anesthesiologist  Anesthesia Plan Comments: (80 year old female with R. Intertrochanteric femur fracture  1. COPD still smoking,  2. Nonischemic cardiomyopathy with AICD/Pacer in place EF 455 #. Hypertension Depression  Plan Spinal with MAC  Roberts Gaudy)        Anesthesia Quick Evaluation

## 2014-05-10 NOTE — Transfer of Care (Signed)
Immediate Anesthesia Transfer of Care Note  Patient: Brianna [name]  Procedure(s) Performed: Procedure(s): INTRAMEDULLARY (IM) NAIL FEMORAL (Right)  Patient Location: PACU  Anesthesia Type:MAC, Spinal and MAC combined with regional for post-op pain  Level of Consciousness: awake  Airway & Oxygen Therapy: Patient Spontanous Breathing  Post-op Assessment: Report given to PACU RN and Post -op Vital signs reviewed and stable  Post vital signs: Reviewed and stable  Complications: No apparent anesthesia complications

## 2014-05-10 NOTE — Consult Note (Signed)
CARDIOLOGY CONSULT NOTE  Patient ID: Brianna [name] MRN: [id number] DOB/AGE: Apr 11, 1930 80 y.o.  Admit date: 05/09/2014 Primary Physician Donnamae Jude, MD Primary Cardiologist Dr. Gwenlyn Found Chief Complaint  Pre op  HPI:  We are called to see this patient for preoperative clearance.  She has a history of a nonischemic cardiomyopathy (mentioned as being chemotherapy induced.)  Normal coronaries on cath in 2004.  She does have an ICD.  She had had a previous EF of 30%.  However it was 45% at the last echo in 6/14.   She was last seen in the clinic with Dr. Gwenlyn Found in Sept of this year.  She was complaining of chronic SOB but was otherwise doing OK.    We are asked to see her preoperatively.  She had a mechanical fall and has a right hip fracture.   She reports that she has been doing well.  She denies any recent new symptoms.  The patient denies any new symptoms such as chest discomfort, neck or arm discomfort. There has been no new shortness of breath, PND or orthopnea. There have been no reported palpitations, presyncope or syncope.  She did report that she had the flu after the flu shot recently.     Past Medical History  Diagnosis Date  . Emphysema of lung   . Cancer     breast  . Depression   . CHF (congestive heart failure)   . Hypertension   . Hyperlipidemia   . Osteopenia   . Nonischemic cardiomyopathy   . Automatic implantable cardiac defibrillator in situ     Past Surgical History  Procedure Laterality Date  . Breast surgery      mastectomy  . Appendectomy  1943  . Tonsillectomy  1940  . Cardiac defibrillator placement  10/13/2004    Medtronic Maximo VR, model [id number], serial [id number]  . Pacemaker insertion  06/17/2010    Medtronic [place] VR, model #[id number], serial [id number]  . Cardiac catheterization  12/18/2002    Normal coronary arteries and normal LV function  . Cardiovascular stress test  12/08/2002    Non-diagnostic exercise stress test secondary to  development of LBBB at peak exercise. Scintigraphic evidence of anterior wall thinning with suggestion of mild anterior and subtle lateral wall ischemaia.  . 2d echocardiogram  01/16/2013    EF 45-50%, moderate tricuspid valve regurg    Allergies  Allergen Reactions  . Atorvastatin     Fatigue   . Claritin [Loratadine] Other (See Comments)    fatigue  . Crestor [Rosuvastatin]     Myalgias. Pt is tolerating Lipitor 20mg  QOD well.   Prescriptions prior to admission  Medication Sig Dispense Refill  . albuterol (PROAIR HFA) 108 (90 BASE) MCG/ACT inhaler Inhale 2 puffs into the lungs every 6 (six) hours as needed for wheezing or shortness of breath.      Marland Kitchen aspirin 81 MG EC tablet Take 81 mg by mouth daily.        . Calcium Carbonate-Vitamin D (CALTRATE 600+D) 600-400 MG-UNIT per tablet Take 1 tablet by mouth daily.       . carvedilol (COREG) 25 MG tablet Take 1 tablet (25 mg total) by mouth 2 (two) times daily with a meal.  180 tablet  2  . enalapril (VASOTEC) 2.5 MG tablet Take 2 tablets (5 mg total) by mouth daily.  90 tablet  3  . FLUoxetine (PROZAC) 10 MG capsule Take 10 mg by mouth at bedtime.      Marland Kitchen  Fluticasone-Salmeterol (ADVAIR) 100-50 MCG/DOSE AEPB Inhale 1 puff into the lungs daily as needed (shortness of breath).      . furosemide (LASIX) 20 MG tablet Take 1 tablet (20 mg total) by mouth daily.  90 tablet  3  . Ginkgo Biloba 40 MG TABS Take 1 tablet by mouth every evening.       . Multiple Vitamin (MULTI-VITAMIN DAILY PO) Take 1 tablet by mouth at bedtime.        Family History  Problem Relation Age of Onset  . Heart disease Mother   . Heart disease Father   . Hyperlipidemia Sister   . Heart disease Brother   . Cancer Daughter     Oral cancer and "knot" on right cheek that was cancer  . Heart attack Maternal Grandmother   . Heart attack Maternal Grandfather   . Heart attack Paternal Grandmother   . Heart attack Paternal Grandfather     History   Social History  .  Marital Status: Widowed    Spouse Name: N/A    Number of Children: 2  . Years of Education: 11   Occupational History  . Retired-Manager Writer.    Social History Main Topics  . Smoking status: Current Some Day Smoker    Types: Cigarettes  . Smokeless tobacco: Never Used     Comment: Pt reports smoking about 1-2 times a week when stressed.   . Alcohol Use: 0.6 oz/week    1 Glasses of wine per week  . Drug Use: No  . Sexual Activity: No   Other Topics Concern  . Not on file   Social History Narrative   Health Care POA:    Emergency Contact: friend, Trevor Iha, [phone]   End of Life Plan:    Who lives with you: self, has medical alert button    Any pets: none   Diet: Patient has a varied diet and controls portions well.   Exercise: Patient does not have any regular exercise routine.   Seatbelts: Patient reports wearing seatbelt when in vehicle.   Nancy Fetter Exposure/Protection: Patient reports wearing sun screening daily.   Hobbies: clothes alterations                 ROS:    As stated in the HPI and negative for all other systems.  Physical Exam: Blood pressure 124/77, pulse 87, temperature 97.4 F (36.3 C), temperature source Oral, resp. rate 16, height 5\' 1"  (1.549 m), weight 118 lb (53.524 kg), SpO2 98.00%.  GENERAL:  Well appearing HEENT:  Pupils equal round and reactive, fundi not visualized, oral mucosa unremarkable NECK:  No jugular venous distention, waveform within normal limits, carotid upstroke brisk and symmetric, no bruits, no thyromegaly LYMPHATICS:  No cervical, inguinal adenopathy LUNGS:  Clear to auscultation bilaterally BACK:  No CVA tenderness CHEST:  Unremarkable HEART:  PMI not displaced or sustained,S1 and S2 within normal limits, no S3, no S4, no clicks, no rubs, no murmurs ABD:  Flat, positive bowel sounds normal in frequency in pitch, no bruits, no rebound, no guarding, no midline pulsatile mass, no hepatomegaly, no splenomegaly EXT:  2  plus pulses throughout, no edema, no cyanosis no clubbing SKIN:  No rashes no nodules NEURO:  Cranial nerves II through XII grossly intact, motor grossly intact throughout PSYCH:  Cognitively intact, oriented to person place and time    Labs: Lab Results  Component Value Date   BUN 20 05/10/2014   Lab Results  Component Value Date  CREATININE 0.86 05/10/2014   Lab Results  Component Value Date   NA 135* 05/10/2014   K 4.6 05/10/2014   CL 99 05/10/2014   CO2 25 05/10/2014   No results found for this basename: TROPONINI   Lab Results  Component Value Date   WBC 9.2 05/10/2014   HGB 10.9* 05/10/2014   HCT 32.0* 05/10/2014   MCV 89.1 05/10/2014   PLT 166 05/10/2014     Radiology:   CXR: Mildly enlarged cardiac silhouette with a mild increase in size.  Stable right subclavian AICD leads. Mild diffuse increase in  prominence of the interstitial markings. Diffuse osteopenia. Stable  scoliosis. Stable left axillary surgical clips and surgically absent  left breast.   EKG:NSR, rate 78, axis WNL, interventricular conduction delay, QT prolonged, poor anterior R wave progression.  No acute ST T wave changes. 05/09/14  ASSESSMENT AND PLAN:   PREOP:  The patient has a moderate functional level (greater than 5 METS).  She has no high risk findings or symptoms.  The planned procedure is not high risk from a cardiovascular standpoint.  It is a necessary surgery for symptoms relief.  Therefore, based on ACC/AHA guidelines, the patient would be at acceptable risk for the planned procedure without further cardiovascular testing.  Attention should be paid to volume status perioperatively and postop given her reduced EF.  ICD:  She has a Artist VR ICD.  Please notify the rep at the time of surgery for appropriate management.  CARDIOMYOPATHY:  She appears to be euvolemic.  Continue current meds as BP tolerates.  Close follow up of intake and output post operatively.      SignedMinus Breeding 05/10/2014, 11:48 AM

## 2014-05-10 NOTE — Consult Note (Signed)
ORTHOPAEDIC CONSULTATION  REQUESTING PHYSICIAN: Zigmund Gottron, MD  Chief Complaint: right hip fx  HPI: Brianna [name] is a 80 y.o. female who complains of right hip fx s/p mechanical fall.  Lives alone, independent.  Ortho consulted for right hip fx.  Past Medical History  Diagnosis Date  . Emphysema of lung   . Cancer     breast  . Depression   . CHF (congestive heart failure)   . Hypertension   . Hyperlipidemia   . Osteopenia   . Nonischemic cardiomyopathy   . Automatic implantable cardiac defibrillator in situ    Past Surgical History  Procedure Laterality Date  . Breast surgery      mastectomy  . Appendectomy  1943  . Tonsillectomy  1940  . Cardiac defibrillator placement  10/13/2004    Medtronic Maximo VR, model [id number], serial [id number]  . Pacemaker insertion  06/17/2010    Medtronic [place] VR, model #D334VRG, serial [id number]  . Cardiac catheterization  12/18/2002    Normal coronary arteries and normal LV function  . Cardiovascular stress test  12/08/2002    Non-diagnostic exercise stress test secondary to development of LBBB at peak exercise. Scintigraphic evidence of anterior wall thinning with suggestion of mild anterior and subtle lateral wall ischemaia.  . 2d echocardiogram  01/16/2013    EF 45-50%, moderate tricuspid valve regurg   History   Social History  . Marital Status: Widowed    Spouse Name: N/A    Number of Children: 2  . Years of Education: 11   Occupational History  . Retired-Manager Writer.    Social History Main Topics  . Smoking status: Current Some Day Smoker    Types: Cigarettes  . Smokeless tobacco: Never Used     Comment: Pt reports smoking about 1-2 times a week when stressed.   . Alcohol Use: 0.6 oz/week    1 Glasses of wine per week  . Drug Use: No  . Sexual Activity: No   Other Topics Concern  . None   Social History Narrative   Health Care POA:    Emergency Contact: friend, Trevor Iha, [phone]     End of Life Plan:    Who lives with you: self, has medical alert button    Any pets: none   Diet: Patient has a varied diet and controls portions well.   Exercise: Patient does not have any regular exercise routine.   Seatbelts: Patient reports wearing seatbelt when in vehicle.   Nancy Fetter Exposure/Protection: Patient reports wearing sun screening daily.   Hobbies: clothes alterations               Family History  Problem Relation Age of Onset  . Heart disease Mother   . Heart disease Father   . Hyperlipidemia Sister   . Heart disease Brother   . Cancer Daughter     Oral cancer and "knot" on right cheek that was cancer  . Heart attack Maternal Grandmother   . Heart attack Maternal Grandfather   . Heart attack Paternal Grandmother   . Heart attack Paternal Grandfather    Allergies  Allergen Reactions  . Atorvastatin     Fatigue   . Claritin [Loratadine] Other (See Comments)    fatigue  . Crestor [Rosuvastatin]     Myalgias. Pt is tolerating Lipitor 62m QOD well.   Prior to Admission medications   Medication Sig Start Date End Date Taking? Authorizing Provider  albuterol (PROAIR HFA) 108 (90 BASE)  MCG/ACT inhaler Inhale 2 puffs into the lungs every 6 (six) hours as needed for wheezing or shortness of breath.   Yes Historical Provider, MD  aspirin 81 MG EC tablet Take 81 mg by mouth daily.     Yes Historical Provider, MD  Calcium Carbonate-Vitamin D (CALTRATE 600+D) 600-400 MG-UNIT per tablet Take 1 tablet by mouth daily.    Yes Historical Provider, MD  carvedilol (COREG) 25 MG tablet Take 1 tablet (25 mg total) by mouth 2 (two) times daily with a meal. 12/22/13  Yes Mihai Croitoru, MD  enalapril (VASOTEC) 2.5 MG tablet Take 2 tablets (5 mg total) by mouth daily. 12/31/13  Yes Lorretta Harp, MD  FLUoxetine (PROZAC) 10 MG capsule Take 10 mg by mouth at bedtime.   Yes Historical Provider, MD  Fluticasone-Salmeterol (ADVAIR) 100-50 MCG/DOSE AEPB Inhale 1 puff into the lungs daily  as needed (shortness of breath).   Yes Historical Provider, MD  furosemide (LASIX) 20 MG tablet Take 1 tablet (20 mg total) by mouth daily. 12/02/13  Yes Mihai Croitoru, MD  Ginkgo Biloba 40 MG TABS Take 1 tablet by mouth every evening.    Yes Historical Provider, MD  Multiple Vitamin (MULTI-VITAMIN DAILY PO) Take 1 tablet by mouth at bedtime.    Yes Historical Provider, MD   Dg Chest 1 View  05/09/2014   CLINICAL DATA:  Right hip fracture following a fall tonight. Preop evaluation.  EXAM: CHEST - 1 VIEW  COMPARISON:  06/17/2010.  FINDINGS: Mildly enlarged cardiac silhouette with a mild increase in size. Stable right subclavian AICD leads. Mild diffuse increase in prominence of the interstitial markings. Diffuse osteopenia. Stable scoliosis. Stable left axillary surgical clips and surgically absent left breast.  IMPRESSION: 1. No acute abnormality. 2. Mildly progressive cardiomegaly. 3. Progressive chronic interstitial lung disease.   Electronically Signed   By: Enrique Sack M.D.   On: 05/09/2014 22:39   Dg Hip Complete Right  05/09/2014   CLINICAL DATA:  Patient fell backwards in her kitchen earlier tonight, right hip pain, initial evaluation  EXAM: RIGHT HIP - COMPLETE 2+ VIEW  COMPARISON:  None.  FINDINGS: Comminuted intertrochanteric right femur fracture with severe varus angulation.  IMPRESSION: Femur fracture   Electronically Signed   By: Skipper Cliche M.D.   On: 05/09/2014 22:37   Dg Femur Right  05/09/2014   CLINICAL DATA:  Fall is evening right hip pain initial evaluation  EXAM: RIGHT FEMUR - 2 VIEW  COMPARISON:  None.  FINDINGS: Intertrochanteric right femur fracture, comminuted. Significant varus angulation. No other abnormalities involving the femur.  IMPRESSION: Right femur fracture   Electronically Signed   By: Skipper Cliche M.D.   On: 05/09/2014 22:38   Ct Head Wo Contrast  05/09/2014   CLINICAL DATA:  Patient at dizzy and fell backwards and hit back of head this evening, no loss  of consciousness or headache, no neck pain, initial evaluation  EXAM: CT HEAD WITHOUT CONTRAST  CT CERVICAL SPINE WITHOUT CONTRAST  TECHNIQUE: Multidetector CT imaging of the head and cervical spine was performed following the standard protocol without intravenous contrast. Multiplanar CT image reconstructions of the cervical spine were also generated.  COMPARISON:  None.  FINDINGS: CT HEAD FINDINGS  Moderate diffuse atrophy. Mild low attenuation in the deep white matter. No evidence of vascular territory infarct or mass. No hemorrhage or extra-axial fluid. Calvarium intact.  CT CERVICAL SPINE FINDINGS  Calcification in the upper mediastinum likely partial volume averaging with calcified aortic arch.  Extensive pleural parenchymal scarring in both lung apices. No evidence of cervical spine fracture. Normal anterior posterior cervical spine alignment. No prevertebral soft tissue swelling. Posterior elements C2 through C4 appear fused. Significant multilevel degenerative disc disease particularly in the central cervical spine.  IMPRESSION: No acute abnormality involving the cervical spine. No acute intracranial abnormality.   Electronically Signed   By: Skipper Cliche M.D.   On: 05/09/2014 22:44   Ct Cervical Spine Wo Contrast  05/09/2014   CLINICAL DATA:  Patient at dizzy and fell backwards and hit back of head this evening, no loss of consciousness or headache, no neck pain, initial evaluation  EXAM: CT HEAD WITHOUT CONTRAST  CT CERVICAL SPINE WITHOUT CONTRAST  TECHNIQUE: Multidetector CT imaging of the head and cervical spine was performed following the standard protocol without intravenous contrast. Multiplanar CT image reconstructions of the cervical spine were also generated.  COMPARISON:  None.  FINDINGS: CT HEAD FINDINGS  Moderate diffuse atrophy. Mild low attenuation in the deep white matter. No evidence of vascular territory infarct or mass. No hemorrhage or extra-axial fluid. Calvarium intact.  CT  CERVICAL SPINE FINDINGS  Calcification in the upper mediastinum likely partial volume averaging with calcified aortic arch. Extensive pleural parenchymal scarring in both lung apices. No evidence of cervical spine fracture. Normal anterior posterior cervical spine alignment. No prevertebral soft tissue swelling. Posterior elements C2 through C4 appear fused. Significant multilevel degenerative disc disease particularly in the central cervical spine.  IMPRESSION: No acute abnormality involving the cervical spine. No acute intracranial abnormality.   Electronically Signed   By: Skipper Cliche M.D.   On: 05/09/2014 22:44    Positive ROS: All other systems have been reviewed and were otherwise negative with the exception of those mentioned in the HPI and as above.  Physical Exam: General: Alert, no acute distress Cardiovascular: No pedal edema Respiratory: No cyanosis, no use of accessory musculature GI: No organomegaly, abdomen is soft and non-tender Skin: No lesions in the area of chief complaint Neurologic: Sensation intact distally Psychiatric: Patient is competent for consent with normal mood and affect Lymphatic: No axillary or cervical lymphadenopathy  MUSCULOSKELETAL:  - painful ROM of right hip - NVI RLE distally - skin intact  Assessment: Right IT hip fx  Plan: - needs surgical treatment - bucks traction for now - continue NPO - needs cards clearance prior to surgery for ICD and NICM - please make urgent consult to cards so surgery can be done today - if cards unable to see today, then will delay surgery until tomorrow - please call with any questions  - Based on history and fracture pattern this likely represents a fragility fracture. - Fragility fractures affect up to one half of women and one third of men after age 47 years and occur in the setting of bone disorder such as osteoporosis or osteopenia and warrant appropriate work-up. - The following are general  recommendations that may serve as an outline for an appropriate work-up:  1.) Obtain bone density measurement to confirm presumptive diagnosis, assess severity of osteoporosis and risk of future fracture, and use as baseline for monitoring treatment  2.) Obtain laboratory tests: CBC, ESR, serum calcium, creatinine, albumin,phosphate, alkaline phosphatase, liver transaminases, protein electrophoresis, urinalysis, 25-hydroxyvitamin D.  3.) Exclude secondary causes of low bone mass and skeletal fragility (eg,multiple myeloma, lymphoma) as indicated.  4.) Obtain radiograph of thoracic and lumbar spine, particularly among individuals with back pain or height loss to assess presence of vertebral fractures  5.) Intermittent administration of recombinant human parathyroid hormone  6.) Optimize nutritional status using nutritional supplementation.  7.) Patient/family education to prevent future falls.  8.) Early mobilization and exercise program - exercise decreases the rate of bone loss and has been associated with decreased rate of fragility fractures   Thank you for the consult and the opportunity to see Ms. Tyrone Sage Eduard Roux, MD [place] 7:32 AM

## 2014-05-10 NOTE — Anesthesia Procedure Notes (Addendum)
Procedure Name: MAC Date/Time: 05/10/2014 4:16 PM Performed by: Jacquiline Doe A Pre-anesthesia Checklist: Patient identified, Timeout performed, Emergency Drugs available, Suction available and Patient being monitored Patient Re-evaluated:Patient Re-evaluated prior to inductionOxygen Delivery Method: Simple face mask Intubation Type: IV induction Placement Confirmation: positive ETCO2 Dental Injury: Teeth and Oropharynx as per pre-operative assessment    Spinal  Patient location during procedure: OR Start time: 05/10/2014 4:45 PM End time: 05/10/2014 4:50 PM Staffing Performed by: anesthesiologist  Preanesthetic Checklist Completed: patient identified, site marked, surgical consent, pre-op evaluation, timeout performed, IV checked, risks and benefits discussed and monitors and equipment checked Spinal Block Patient position: right lateral decubitus Prep: ChloraPrep Patient monitoring: cardiac monitor, heart rate, continuous pulse ox and blood pressure Approach: right paramedian Location: L4-5 Injection technique: single-shot Needle Needle type: Tuohy  Needle gauge: 22 G Needle length: 9 cm Needle insertion depth: 5 cm Assessment Sensory level: T10 Additional Notes 7.5 mg of 0.75% bupivacaine injected easily

## 2014-05-10 NOTE — H&P (Signed)
[facility] Admission History and Physical Service Pager: [phone]  Patient name: Brianna [name] Medical record number: [id number] Date of birth: 25-Oct-1929 Age: 80 y.o. Gender: female  Primary Care Provider: Donnamae Jude, MD Consultants: Brianna [name] Code Status: DNR per discussion  Chief Complaint: fall with hip pain and deformity  Assessment and Plan: Brianna [name] is a 80 y.o. female presenting with fall and subsequent hip pain and deformity . PMH is significant for ventricular tachycardia, insomnia, defibrillator placed 4-5 years ago, asthma, h/o breast cancer, chronic diastolic heart failure, MDD, recurrent, HLD, HTN, NICM, and osteoporosis.  Hip fracture DG hip with comminuted intertrochanteric right femur fracture with severe varus angulation. Tender to palpation.  - Admit to inpatient on Family Medicine Teaching Service, attending Dr Andria Frames - ED discussed with Brianna [name] who plan to see tomorrow. - Apply Buck's traction in ED - NPO, bed rest, check CMS q2 hours - Pain control: continue fentanyl 73mcg q69min PRN which is controlling pain now at 3 doses in the ED. - Halved coreg to 12.5mg  BID - Insert foley - PT/OT after orthopedic surgery; will likely need CIR and then subsequent modification of home (HH safety eval, encouragement to use assistive cane and walker). - Hold aspirin  Fall  No syncope, reports she just fell with no preceding symptoms. Afebrile, borderline BP 109/56 in ED, 2L [place], no leukocytosis, Hgb at baseline, bMEt unremarkable. Head and c-spine CT unremarkable. Neg CXR. Trop neg. 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 - Admit on telemetry, attending Dr Andria Frames - PT/OT consult - Continuous pulse oximetry  Psych - Continue home prozac 10mg  qhs  Asthma - Continue formulary alternative dulera and albuterol PRN  HTN, h/o NICM - Continue coreg at decreased dose 12.5mg  BID, hold lasix and enalapril as pt is NPO  FEN/GI:  NPO, IV fluids at 75cc/hr with h/o CHF Prophylaxis: SCD  Disposition: Pending eval and likely surgery to repair fracture  History of Present Illness: Brianna [name] is a 80 y.o. female presenting with right hip fracture after fall with no syncope. She reports that earlier today, she turned to put dish in sink into dishwasher and fell. Has dizzy spells at times and is unsure fi dizzy right before. Fell backwards. Did not pass out at any time. Pushed medical alert button. Hit back of head, which bumped twice. No trouble seeing, speaking, using arms since that time. Right leg pain now is 5/10, excruciating at time of fall. No chest pain or trouble breathing when fell. Remembers fall. Has fallen lots recently. During another fall today, was walking down hall and wall caught her. Has walker and cane but does not use. Uses 4-pronged cane when walking to mail. Uses walker near chair or wall but most of the time does not use. No problems leading up to this incident, no recent medication changes, fevers or chills except flu shot 3 weeks ago, feeling weak ever since.    In the ED, she was given NS at 50cc/hr, fentanyl 10mcg x 3.  Review Of Systems: Per HPI with the following additions: None. Otherwise 12 point review of systems was performed and was unremarkable.  Patient Active Problem List   Diagnosis Date Noted  . Hip fracture, intertrochanteric 05/10/2014  . Nonischemic cardiomyopathy 03/24/2014  . Dyspnea 08/03/2013  . Automatic implantable cardiac defibrillator in situ 04/16/2013  . Asthma 09/28/2010  . CYSTOCELE WITHOUT MENTION UTERINE PROLAPSE LAT 10/12/2008  . BREAST CANCER, HX OF 11/05/2007  . INSOMNIA-SLEEP  [place] 07/10/2007  . HYPERCHOLESTEROLEMIA 09/20/2006  . DEPRESSION, MAJOR, RECURRENT 09/20/2006  . HYPERTENSION, BENIGN SYSTEMIC 09/20/2006  . VENTRICULAR TACHYCARDIA 09/20/2006  . CHRONIC DIASTOLIC HEART FAILURE 76/19/5093  . PSORIASIS 09/20/2006  . OSTEOPOROSIS,  UNSPECIFIED 09/20/2006   Past Medical History: Past Medical History  Diagnosis Date  . Emphysema of lung   . Cancer     breast  . Depression   . CHF (congestive heart failure)   . Hypertension   . Hyperlipidemia   . Osteopenia   . Nonischemic cardiomyopathy   . Automatic implantable cardiac defibrillator in situ   No h/o blood clots. Meds reviewed  Past Surgical History: Past Surgical History  Procedure Laterality Date  . Breast surgery      mastectomy  . Appendectomy  1943  . Tonsillectomy  1940  . Cardiac defibrillator placement  10/13/2004    Medtronic Maximo VR, model [id number], serial [id number]  . Pacemaker insertion  06/17/2010    Medtronic [place] VR, model #[id number], serial [id number]  . Cardiac catheterization  12/18/2002    Normal coronary arteries and normal LV function  . Cardiovascular stress test  12/08/2002    Non-diagnostic exercise stress test secondary to development of LBBB at peak exercise. Scintigraphic evidence of anterior wall thinning with suggestion of mild anterior and subtle lateral wall ischemaia.  . 2d echocardiogram  01/16/2013    EF 45-50%, moderate tricuspid valve regurg   Social History: History  Substance Use Topics  . Smoking status: Current Some Day Smoker    Types: Cigarettes  . Smokeless tobacco: Never Used     Comment: Pt reports smoking about 1-2 times a week when stressed.   . Alcohol Use: 0.6 oz/week    1 Glasses of wine per week   Additional social history: Lives alone, does not use walking asistive device. Please also refer to relevant sections of EMR.  Family History: Family History  Problem Relation Age of Onset  . Heart disease Mother   . Heart disease Father   . Hyperlipidemia Sister   . Heart disease Brother   . Cancer Daughter     Oral cancer and "knot" on right cheek that was cancer  . Heart attack Maternal Grandmother   . Heart attack Maternal Grandfather   . Heart attack Paternal Grandmother   . Heart attack  Paternal Grandfather    Allergies and Medications: Allergies  Allergen Reactions  . Atorvastatin     Fatigue   . Claritin [Loratadine] Other (See Comments)    fatigue  . Crestor [Rosuvastatin]     Myalgias. Pt is tolerating Lipitor 20mg  QOD well.   No current facility-administered medications on file prior to encounter.   Current Outpatient Prescriptions on File Prior to Encounter  Medication Sig Dispense Refill  . ADVAIR DISKUS 100-50 MCG/DOSE AEPB INHALE 1 PUFF INTO THE LUNGS 2 (TWO) TIMES DAILY.  60 each  1  . aspirin 81 MG EC tablet Take 81 mg by mouth daily.        . Calcium Carbonate-Vitamin D (CALTRATE 600+D) 600-400 MG-UNIT per tablet Take 1 tablet by mouth daily.       . carvedilol (COREG) 25 MG tablet Take 1 tablet (25 mg total) by mouth 2 (two) times daily with a meal.  180 tablet  2  . enalapril (VASOTEC) 2.5 MG tablet Take 2 tablets (5 mg total) by mouth daily.  90 tablet  3  . furosemide (LASIX) 20 MG tablet Take 1 tablet (20 mg total) by mouth  daily.  90 tablet  3  . Ginkgo Biloba 40 MG TABS Take by mouth daily.      . Multiple Vitamin (MULTI-VITAMIN DAILY PO) Take 1 tablet by mouth.      Marland Kitchen PROAIR HFA 108 (90 BASE) MCG/ACT inhaler INHALE 2 PUFFS EVERY 6 HOURS AS NEEDED FOR WHEEZING OR SHORTNESS OF BREATH  8.5 each  1    Objective: BP 109/56  Pulse 78  Temp(Src) 97.4 F (36.3 C) (Oral)  Resp 13  Ht 5\' 1"  (1.549 m)  Wt 118 lb (53.524 kg)  BMI 22.31 kg/m2  SpO2 95% Exam: General: NAD, lying in bed HEENT: AT/[place], sclera clear, EOMI, o/p clear with poor dentition Cardiovascular: RRR, no m/r/g, 1+ b DP pulses, 2+ B radial pulses Respiratory: CTAB, normal effot Abdomen: S/NT/ND, nabs Extremities: No LE edema or calf tenderness; right leg externally rotated with obvious deformity lateral leg, very tender with just light touch over lateral upper thigh, able to move toes bilaterally, 1+ B DP pulses Skin: no rash or cyanosis Neuro: awake, alert, normal speech, EOMI,  CN 2-12 tested and intact  Labs and Imaging: CBC BMET   Recent Labs Lab 05/09/14 2034  WBC 7.1  HGB 11.9*  HCT 34.8*  PLT 177    Recent Labs Lab 05/09/14 2034  NA 134*  K 3.9  CL 96  CO2 24  BUN 16  CREATININE 0.88  GLUCOSE 119*  CALCIUM 8.6      CT head and c-spine without contrast: IMPRESSION:  No acute abnormality involving the cervical spine. No acute  intracranial abnormality.  DG hip complete righT and DG femur right: FINDINGS:  Comminuted intertrochanteric right femur fracture with severe varus  angulation.   DG chest 1 view: IMPRESSION:  1. No acute abnormality.  2. Mildly progressive cardiomegaly.  3. Progressive chronic interstitial lung disease.    Hilton Sinclair, MD 05/10/2014, 12:14 AM PGY-3, [place] Intern pager: [phone], text pages welcome

## 2014-05-11 DIAGNOSIS — S72143A Displaced intertrochanteric fracture of unspecified femur, initial encounter for closed fracture: Secondary | ICD-10-CM

## 2014-05-11 DIAGNOSIS — D62 Acute posthemorrhagic anemia: Secondary | ICD-10-CM

## 2014-05-11 LAB — BASIC METABOLIC PANEL
Anion gap: 9 (ref 5–15)
BUN: 16 mg/dL (ref 6–23)
CALCIUM: 7.1 mg/dL — AB (ref 8.4–10.5)
CO2: 23 mEq/L (ref 19–32)
Chloride: 105 mEq/L (ref 96–112)
Creatinine, Ser: 0.75 mg/dL (ref 0.50–1.10)
GFR calc Af Amer: 90 mL/min — ABNORMAL LOW (ref >90)
GFR, EST NON AFRICAN AMERICAN: 77 mL/min — AB (ref >90)
GLUCOSE: 100 mg/dL — AB (ref 70–99)
POTASSIUM: 4.1 meq/L (ref 3.7–5.3)
Sodium: 137 mEq/L (ref 137–147)

## 2014-05-11 LAB — CBC
HEMATOCRIT: 24.9 % — AB (ref 36.0–46.0)
HEMOGLOBIN: 8.3 g/dL — AB (ref 12.0–15.0)
MCH: 30.9 pg (ref 26.0–34.0)
MCHC: 33.3 g/dL (ref 30.0–36.0)
MCV: 92.6 fL (ref 78.0–100.0)
Platelets: 123 10*3/uL — ABNORMAL LOW (ref 150–400)
RBC: 2.69 MIL/uL — ABNORMAL LOW (ref 3.87–5.11)
RDW: 13.9 % (ref 11.5–15.5)
WBC: 6.3 10*3/uL (ref 4.0–10.5)

## 2014-05-11 MED ORDER — ENSURE COMPLETE PO LIQD
237.0000 mL | Freq: Three times a day (TID) | ORAL | Status: DC
  Administered 2014-05-11 – 2014-05-14 (×8): 237 mL via ORAL

## 2014-05-11 NOTE — Evaluation (Signed)
Physical Therapy Evaluation Patient Details Name: Brianna [name] MRN: [id number] DOB: 09-Jan-1930 Today's Date: 05/11/2014   History of Present Illness  Brianna [name] is a 80 y.o. female who complains of right hip fx s/p mechanical fall Now s/p surgical fixation of fx; WBAT  Clinical Impression   Patient is s/p above surgery resulting in functional limitations due to the deficits listed below (see PT Problem List).  Patient will benefit from skilled PT to increase their independence and safety with mobility to allow discharge to the venue listed below.       Follow Up Recommendations SNF    Equipment Recommendations  Rolling walker with 5" wheels;3in1 (PT)    Recommendations for Other Services       Precautions / Restrictions Precautions Precautions: Fall Restrictions Weight Bearing Restrictions: Yes RLE Weight Bearing: Weight bearing as tolerated      Mobility  Bed Mobility               General bed mobility comments: In recliner upon arrival  Transfers Overall transfer level: Needs assistance Equipment used: Rolling walker (2 wheeled) Transfers: Sit to/from Stand Sit to Stand: Mod assist         General transfer comment: Cues for hand position, safety, and putting most of her weight on RLE during transition to help with LLE pain  Ambulation/Gait Ambulation/Gait assistance: Min assist;+2 safety/equipment (second person to push chair behind pt) Ambulation Distance (Feet): 8 Feet Assistive device: Rolling walker (2 wheeled) Gait Pattern/deviations: Step-to pattern;Decreased stance time - left Gait velocity: quite slow   General Gait Details: Step-by-step cues for gait sequence, and to bear down on RW to unweign painful LLE during R foot advancement  Stairs            Wheelchair Mobility    Modified Rankin (Stroke Patients Only)       Balance Overall balance assessment: Needs assistance         Standing balance support: Bilateral  upper extremity supported Standing balance-Leahy Scale: Poor Standing balance comment: Dependent on UE support on RW                             Pertinent Vitals/Pain Pain Assessment: 0-10 Pain Score: 10-Worst pain ever Pain Location: L Hip Pain Descriptors / Indicators: Aching Pain Intervention(s): RN gave pain meds during session;Repositioned;Limited activity within patient's tolerance;Other (comment) (PT educated in ways to minimize pain during mobility )    Home Living Family/patient expects to be discharged to:: Skilled nursing facility Living Arrangements: Alone                    Prior Function Level of Independence: Independent               Hand Dominance        Extremity/Trunk Assessment   Upper Extremity Assessment: Overall WFL for tasks assessed           Lower Extremity Assessment: LLE deficits/detail   LLE Deficits / Details: Grossly decr AROM and strength, limited by pain postop  Cervical / Trunk Assessment: Normal  Communication   Communication: No difficulties  Cognition Arousal/Alertness: Awake/alert Behavior During Therapy: WFL for tasks assessed/performed Overall Cognitive Status: Within Functional Limits for tasks assessed                      General Comments General comments (skin integrity, edema, etc.): Encouraged pt to spend the  greater part of the day OOB to combat the effects of Bedrest    Exercises        Assessment/Plan    PT Assessment Patient needs continued PT services  PT Diagnosis Difficulty walking;Acute pain   PT Problem List Decreased strength;Decreased range of motion;Decreased activity tolerance;Decreased balance;Decreased mobility;Decreased knowledge of use of DME;Decreased knowledge of precautions;Pain  PT Treatment Interventions DME instruction;Gait training;Functional mobility training;Therapeutic activities;Therapeutic exercise;Balance training;Patient/family education   PT Goals  (Current goals can be found in the Care Plan section) Acute Rehab PT Goals Patient Stated Goal: wants to get back to normal PT Goal Formulation: With patient Time For Goal Achievement: 05/25/14 Potential to Achieve Goals: Good    Frequency Min 6X/week   Barriers to discharge Decreased caregiver support      Co-evaluation               End of Session Equipment Utilized During Treatment: Gait belt Activity Tolerance: Patient tolerated treatment well Patient left: in chair;with call bell/phone within reach Nurse Communication: Mobility status         Time: 7001-7494 PT Time Calculation (min): 21 min   Charges:   PT Evaluation $Initial PT Evaluation Tier I: 1 Procedure PT Treatments $Gait Training: 8-22 mins   PT G Codes:          Quin Hoop 05/11/2014, 11:39 AM  Roney Marion, [place] Pager [phone] Office [phone]

## 2014-05-11 NOTE — Progress Notes (Signed)
Bucks traction

## 2014-05-11 NOTE — Progress Notes (Signed)
I have seen and examined this patient. I have discussed with Dr Phelps.  I agree with their findings and plans as documented in their progress note.    

## 2014-05-11 NOTE — Progress Notes (Signed)
Patient ID: Brianna [name], female   DOB: 1930-01-30, 80 y.o.   MRN: [id number] Family Medicine Teaching Service Daily Progress Note Intern Pager: [phone]  Patient name: Brianna [name] Medical record number: [id number] Date of birth: 12/20/1929 Age: 80 y.o. Gender: female  Primary Care Provider: Donnamae Jude, MD Consultants: Ortho, Cards Code Status: DNR  Pt Overview and Major Events to Date:  10/17: Admitted for fall and resultant femur fracture 10/18: Patient went to OR   Assessment and Plan: ELEXIUS MINAR is a 80 y.o. female presenting with fall and subsequent hip pain and deformity . PMH is significant for ventricular tachycardia, insomnia, defibrillator placed 4-5 years ago, asthma, h/o breast cancer, chronic diastolic heart failure, MDD, recurrent, HLD, HTN, NICM, and osteoporosis.   Hip fracture  DG hip with comminuted intertrochanteric right femur fracture with severe varus angulation. Tender to palpation. Patient taken to OR for intramedullary nail femoral (R). - ortho consulted; appreciate recs - following patient post-op -post-op anemia noted - will monitor Hbg - Pain control: morphine q2 prn, oxycodone q4 prn, norco q6 prn, fentanyl - Halved coreg to 12.5mg  BID  -Foley removed - patient will likely need placement - Hold aspirin  -bowel regimen with miralax and senna -anticoagulation with Lovenox  Fall  No syncope, reports she just fell with no preceding symptoms. Head and c-spine CT unremarkable. Neg CXR. Trop neg. 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 Patient able to do all ADLs prior to admission. - PT/OT consult; appreciate recs -> likely SNF vs CIR - Continuous pulse oximetry  -patient up with assistance  Chronic diastolic heart failure - PTA on Lasix 20 mg daily. Volume status OK now,  -discontinue IVF since taking POs -daily weight -strict I/O  - on BB, restart ACE when BP will allow.   Osteoporosis  - No prior diagnosis but likely  due to femur fracture from fall. -start Vit. D and Calcium -consider bisphosphonate   Psych  - Continue home prozac 10mg  qhs   Asthma  - Continue formulary alternative dulera and albuterol PRN   HTN, h/o NICM  - Continue coreg at decreased dose 12.5mg  BID, hold lasix and enalapril as pt is NPO   FEN/GI: heart Healthy diet; KVO fluids Prophylaxis: SCD    Disposition: Continue current management; dispo pending palcement  Subjective:  Patient doing well this morning. Still complaining of some hip pain. We discussed CIR vs SNF placement and patient is agreeable to go. She stays at home alone and was previously able to do all her ADLs without assistance.   Objective: Temp:  [97.6 F (36.4 C)-99.4 F (37.4 C)] 97.7 F (36.5 C) (10/19 0642) Pulse Rate:  [71-98] 86 (10/19 0642) Resp:  [13-18] 16 (10/19 0400) BP: (98-144)/(56-80) 104/56 mmHg (10/19 0642) SpO2:  [95 %-100 %] 98 % (10/19 0749) FiO2 (%):  [28 %] 28 % (10/19 0749) Physical Exam: General: NAD, lying in bed  Cardiovascular: RRR, no m/r/g, 1+ b DP pulses, 2+ B radial pulses  Respiratory: CTAB, normal effot  Abdomen: S/NT/ND  Extremities: No LE edema or calf tenderness; SCDs and TED hose on bilateral legs Neuro: awake, alert, normal speech, EOMI, CN grossly intact  Laboratory:  Recent Labs Lab 05/10/14 0525 05/10/14 1910 05/11/14 0645  WBC 9.2 10.2 6.3  HGB 10.9* 9.7* 8.3*  HCT 32.0* 28.9* 24.9*  PLT 166 150 123*    Recent Labs Lab 05/09/14 2034 05/10/14 0525 05/10/14 1910 05/11/14 0645  NA 134* 135*  --  137  K 3.9 4.6  --  4.1  CL 96 99  --  105  CO2 24 25  --  23  BUN 16 20  --  16  CREATININE 0.88 0.86 0.84 0.75  CALCIUM 8.6 8.3*  --  7.1*  GLUCOSE 119* 169*  --  100*    Imaging/Diagnostic Tests: Dg Chest 1 View 05/09/2014  IMPRESSION: 1. No acute abnormality. 2. Mildly progressive cardiomegaly. 3. Progressive chronic interstitial lung disease.   Dg Hip Complete Right 05/09/2014   IMPRESSION: Femur fracture     Dg Hip Operative Right 05/10/2014  IMPRESSION: Intraoperative fluoroscopic spot images of right intertrochanteric hip fracture fixation with antegrade intra medullary rod, and locking screw fixation. No immediate complicating feature.  Ct Head Wo Contrast 05/09/2014  IMPRESSION: No acute abnormality involving the cervical spine. No acute intracranial abnormality.    Ct Cervical Spine Wo Contrast 05/09/2014   IMPRESSION: No acute abnormality involving the cervical spine. No acute intracranial abnormality.     Katheren Shams, DO 05/11/2014, 8:26 AM PGY-1, [place] Intern pager: [phone], text pages welcome

## 2014-05-11 NOTE — Consult Note (Signed)
Physical Medicine and Rehabilitation Consult Reason for Consult: Right intertrochanteric hip fracture Referring Physician: Dr. Andria Frames   HPI: Brianna [name] is a 80 y.o. right-handed female with history of ventricular tachycardia and defibrillator placed 5 years ago, chronic diastolic congestive heart failure. Patient lives alone independent prior to admission and driving. Presented 05/10/2014 after mechanical fall. No loss of consciousness. Denied chest pain shortness of breath associated with fall. X-rays and imaging revealed a right intertrochanteric hip fracture. Underwent cardiology clearance for surgery. Patient with surgical intervention with intramedullary implant 05/10/2014 per Dr.Xu. Hospital course pain management. Weightbearing as tolerated right lower extremity. Acute blood loss anemia 8.3 and monitored. Physical therapy evaluation completed 05/11/2014. M.D. has requested physical medicine rehabilitation consult.   Review of Systems  Cardiovascular: Positive for palpitations and leg swelling.  Musculoskeletal: Positive for myalgias.  Psychiatric/Behavioral: Positive for depression.  All other systems reviewed and are negative.  Past Medical History  Diagnosis Date  . Emphysema of lung   . Cancer     breast  . Depression   . Hypertension   . Hyperlipidemia   . Osteopenia   . Nonischemic cardiomyopathy   . Automatic implantable cardiac defibrillator in situ    Past Surgical History  Procedure Laterality Date  . Breast surgery      mastectomy  . Appendectomy  1943  . Tonsillectomy  1940  . Cardiac defibrillator placement  10/13/2004    Medtronic Maximo VR, model [id number], serial [id number]  . Pacemaker insertion  06/17/2010    Medtronic [place] VR, model #[id number], serial [id number]  . Cardiac catheterization  12/18/2002    Normal coronary arteries and normal LV function   Family History  Problem Relation Age of Onset  . Heart disease Mother     Pacemaker    . Heart disease Father     No details.    . Hyperlipidemia Sister   . Heart disease Brother     "Heart Attacks"  Died age 34  . Cancer Daughter     Oral cancer and "knot" on right cheek that was cancer   Social History:  reports that she has been smoking Cigarettes.  She has been smoking about 0.00 packs per day. She has never used smokeless tobacco. She reports that she drinks about .6 ounces of alcohol per week. She reports that she does not use illicit drugs. Allergies:  Allergies  Allergen Reactions  . Atorvastatin     Fatigue   . Claritin [Loratadine] Other (See Comments)    fatigue  . Crestor [Rosuvastatin]     Myalgias. Pt is tolerating Lipitor 20mg  QOD well.   Medications Prior to Admission  Medication Sig Dispense Refill  . albuterol (PROAIR HFA) 108 (90 BASE) MCG/ACT inhaler Inhale 2 puffs into the lungs every 6 (six) hours as needed for wheezing or shortness of breath.      Marland Kitchen aspirin 81 MG EC tablet Take 81 mg by mouth daily.        . Calcium Carbonate-Vitamin D (CALTRATE 600+D) 600-400 MG-UNIT per tablet Take 1 tablet by mouth daily.       . carvedilol (COREG) 25 MG tablet Take 1 tablet (25 mg total) by mouth 2 (two) times daily with a meal.  180 tablet  2  . enalapril (VASOTEC) 2.5 MG tablet Take 2 tablets (5 mg total) by mouth daily.  90 tablet  3  . FLUoxetine (PROZAC) 10 MG capsule Take 10 mg by mouth at bedtime.      Marland Kitchen  Fluticasone-Salmeterol (ADVAIR) 100-50 MCG/DOSE AEPB Inhale 1 puff into the lungs daily as needed (shortness of breath).      . furosemide (LASIX) 20 MG tablet Take 1 tablet (20 mg total) by mouth daily.  90 tablet  3  . Ginkgo Biloba 40 MG TABS Take 1 tablet by mouth every evening.       . Multiple Vitamin (MULTI-VITAMIN DAILY PO) Take 1 tablet by mouth at bedtime.         Home: Home Living Family/patient expects to be discharged to:: Skilled nursing facility Living Arrangements: Alone  Functional History: Prior Function Level of  Independence: Independent Functional Status:  Mobility: Bed Mobility General bed mobility comments: In recliner upon arrival Transfers Overall transfer level: Needs assistance Equipment used: Rolling walker (2 wheeled) Transfers: Sit to/from Stand Sit to Stand: Mod assist General transfer comment: Cues for hand position, safety, and putting most of her weight on RLE during transition to help with LLE pain Ambulation/Gait Ambulation/Gait assistance: Min assist;+2 safety/equipment (second person to push chair behind pt) Ambulation Distance (Feet): 8 Feet Assistive device: Rolling walker (2 wheeled) Gait Pattern/deviations: Step-to pattern;Decreased stance time - left Gait velocity: quite slow General Gait Details: Step-by-step cues for gait sequence, and to bear down on RW to unweign painful LLE during R foot advancement    ADL:    Cognition: Cognition Overall Cognitive Status: Within Functional Limits for tasks assessed Orientation Level: Oriented X4 Cognition Arousal/Alertness: Awake/alert Behavior During Therapy: WFL for tasks assessed/performed Overall Cognitive Status: Within Functional Limits for tasks assessed  Blood pressure 104/56, pulse 86, temperature 97.7 F (36.5 C), temperature source Axillary, resp. rate 16, height 5\' 1"  (1.549 m), weight 53.524 kg (118 lb), SpO2 98.00%. Physical Exam  Constitutional: She is oriented to person, place, and time. She appears well-developed.  HENT:  Head: Normocephalic.  Eyes: EOM are normal.  Neck: Normal range of motion. Neck supple. No thyromegaly present.  Cardiovascular:  Cardiac rate controlled  Respiratory: Effort normal and breath sounds normal. No respiratory distress.  GI: Soft. Bowel sounds are normal. She exhibits no distension.  Musculoskeletal:  Right hip dressed, tender  Neurological: She is alert and oriented to person, place, and time.  Follows full commands. Right leg limited due to pain.   Skin:  Hip  incision clean and dry with dressing intact appropriately tender  Psychiatric: She has a normal mood and affect. Her behavior is normal. Judgment and thought content normal.    Results for orders placed during the hospital encounter of 05/09/14 (from the past 24 hour(s))  CBC     Status: Abnormal   Collection Time    05/10/14  7:10 PM      Result Value Ref Range   WBC 10.2  4.0 - 10.5 K/uL   RBC 3.22 (*) 3.87 - 5.11 MIL/uL   Hemoglobin 9.7 (*) 12.0 - 15.0 g/dL   HCT 28.9 (*) 36.0 - 46.0 %   MCV 89.8  78.0 - 100.0 fL   MCH 30.1  26.0 - 34.0 pg   MCHC 33.6  30.0 - 36.0 g/dL   RDW 13.7  11.5 - 15.5 %   Platelets 150  150 - 400 K/uL  CREATININE, SERUM     Status: Abnormal   Collection Time    05/10/14  7:10 PM      Result Value Ref Range   Creatinine, Ser 0.84  0.50 - 1.10 mg/dL   GFR calc non Af Amer 63 (*) >90 mL/min   GFR  calc Af Amer 74 (*) >90 mL/min  CBC     Status: Abnormal   Collection Time    05/11/14  6:45 AM      Result Value Ref Range   WBC 6.3  4.0 - 10.5 K/uL   RBC 2.69 (*) 3.87 - 5.11 MIL/uL   Hemoglobin 8.3 (*) 12.0 - 15.0 g/dL   HCT 24.9 (*) 36.0 - 46.0 %   MCV 92.6  78.0 - 100.0 fL   MCH 30.9  26.0 - 34.0 pg   MCHC 33.3  30.0 - 36.0 g/dL   RDW 13.9  11.5 - 15.5 %   Platelets 123 (*) 150 - 400 K/uL  BASIC METABOLIC PANEL     Status: Abnormal   Collection Time    05/11/14  6:45 AM      Result Value Ref Range   Sodium 137  137 - 147 mEq/L   Potassium 4.1  3.7 - 5.3 mEq/L   Chloride 105  96 - 112 mEq/L   CO2 23  19 - 32 mEq/L   Glucose, Bld 100 (*) 70 - 99 mg/dL   BUN 16  6 - 23 mg/dL   Creatinine, Ser 0.75  0.50 - 1.10 mg/dL   Calcium 7.1 (*) 8.4 - 10.5 mg/dL   GFR calc non Af Amer 77 (*) >90 mL/min   GFR calc Af Amer 90 (*) >90 mL/min   Anion gap 9  5 - 15   Dg Chest 1 View  05/09/2014   CLINICAL DATA:  Right hip fracture following a fall tonight. Preop evaluation.  EXAM: CHEST - 1 VIEW  COMPARISON:  06/17/2010.  FINDINGS: Mildly enlarged cardiac  silhouette with a mild increase in size. Stable right subclavian AICD leads. Mild diffuse increase in prominence of the interstitial markings. Diffuse osteopenia. Stable scoliosis. Stable left axillary surgical clips and surgically absent left breast.  IMPRESSION: 1. No acute abnormality. 2. Mildly progressive cardiomegaly. 3. Progressive chronic interstitial lung disease.   Electronically Signed   By: Enrique Sack M.D.   On: 05/09/2014 22:39   Dg Hip Complete Right  05/09/2014   CLINICAL DATA:  Patient fell backwards in her kitchen earlier tonight, right hip pain, initial evaluation  EXAM: RIGHT HIP - COMPLETE 2+ VIEW  COMPARISON:  None.  FINDINGS: Comminuted intertrochanteric right femur fracture with severe varus angulation.  IMPRESSION: Femur fracture   Electronically Signed   By: Skipper Cliche M.D.   On: 05/09/2014 22:37   Dg Hip Operative Right  05/10/2014   CLINICAL DATA:  80 year old female status post hip fixation  EXAM: DG OPERATIVE right HIP 1-2 VIEWS  TECHNIQUE: Fluoroscopic spot image(s) were submitted for interpretation post-operatively.  COMPARISON:  Plain film 05/09/2014  FINDINGS: Intraoperative fluoroscopic spot images of the right hip demonstrate antegrade intra medullary rod placement gamma nail and interference screw fixation at site of previously identified inner trochanteric hip fracture. No immediate complicating feature.  IMPRESSION: Intraoperative fluoroscopic spot images of right intertrochanteric hip fracture fixation with antegrade intra medullary rod, and locking screw fixation. No immediate complicating feature.  Signed,  Dulcy Fanny. Earleen Newport, DO  [facility]  [facility]   Electronically Signed   By: Corrie Mckusick D.O.   On: 05/10/2014 17:40   Dg Femur Right  05/09/2014   CLINICAL DATA:  Fall is evening right hip pain initial evaluation  EXAM: RIGHT FEMUR - 2 VIEW  COMPARISON:  None.  FINDINGS: Intertrochanteric right femur  fracture, comminuted. Significant varus  angulation. No other abnormalities involving the femur.  IMPRESSION: Right femur fracture   Electronically Signed   By: Skipper Cliche M.D.   On: 05/09/2014 22:38   Ct Head Wo Contrast  05/09/2014   CLINICAL DATA:  Patient at dizzy and fell backwards and hit back of head this evening, no loss of consciousness or headache, no neck pain, initial evaluation  EXAM: CT HEAD WITHOUT CONTRAST  CT CERVICAL SPINE WITHOUT CONTRAST  TECHNIQUE: Multidetector CT imaging of the head and cervical spine was performed following the standard protocol without intravenous contrast. Multiplanar CT image reconstructions of the cervical spine were also generated.  COMPARISON:  None.  FINDINGS: CT HEAD FINDINGS  Moderate diffuse atrophy. Mild low attenuation in the deep white matter. No evidence of vascular territory infarct or mass. No hemorrhage or extra-axial fluid. Calvarium intact.  CT CERVICAL SPINE FINDINGS  Calcification in the upper mediastinum likely partial volume averaging with calcified aortic arch. Extensive pleural parenchymal scarring in both lung apices. No evidence of cervical spine fracture. Normal anterior posterior cervical spine alignment. No prevertebral soft tissue swelling. Posterior elements C2 through C4 appear fused. Significant multilevel degenerative disc disease particularly in the central cervical spine.  IMPRESSION: No acute abnormality involving the cervical spine. No acute intracranial abnormality.   Electronically Signed   By: Skipper Cliche M.D.   On: 05/09/2014 22:44   Ct Cervical Spine Wo Contrast  05/09/2014   CLINICAL DATA:  Patient at dizzy and fell backwards and hit back of head this evening, no loss of consciousness or headache, no neck pain, initial evaluation  EXAM: CT HEAD WITHOUT CONTRAST  CT CERVICAL SPINE WITHOUT CONTRAST  TECHNIQUE: Multidetector CT imaging of the head and cervical spine was performed following the standard protocol without  intravenous contrast. Multiplanar CT image reconstructions of the cervical spine were also generated.  COMPARISON:  None.  FINDINGS: CT HEAD FINDINGS  Moderate diffuse atrophy. Mild low attenuation in the deep white matter. No evidence of vascular territory infarct or mass. No hemorrhage or extra-axial fluid. Calvarium intact.  CT CERVICAL SPINE FINDINGS  Calcification in the upper mediastinum likely partial volume averaging with calcified aortic arch. Extensive pleural parenchymal scarring in both lung apices. No evidence of cervical spine fracture. Normal anterior posterior cervical spine alignment. No prevertebral soft tissue swelling. Posterior elements C2 through C4 appear fused. Significant multilevel degenerative disc disease particularly in the central cervical spine.  IMPRESSION: No acute abnormality involving the cervical spine. No acute intracranial abnormality.   Electronically Signed   By: Skipper Cliche M.D.   On: 05/09/2014 22:44    Assessment/Plan: Diagnosis: right intertrochanteric hip fx 1. Does the need for close, 24 hr/day medical supervision in concert with the patient's rehab needs make it unreasonable for this patient to be served in a less intensive setting? Yes 2. Co-Morbidities requiring supervision/potential complications: NICM, anemia, pain 3. Due to bladder management, bowel management, safety, skin/wound care, disease management, medication administration and pain management, does the patient require 24 hr/day rehab nursing? Yes 4. Does the patient require coordinated care of a physician, rehab nurse, PT (1-2 hrs/day, 5 days/week) and OT (1-2 hrs/day, 5 days/week) to address physical and functional deficits in the context of the above medical diagnosis(es)? Yes Addressing deficits in the following areas: balance, endurance, locomotion, strength, transferring, bowel/bladder control, bathing, dressing, feeding, grooming and toileting 5. Can the patient actively participate in  an intensive therapy program of at least 3 hrs of therapy per day at least 5  days per week? Yes 6. The potential for patient to make measurable gains while on inpatient rehab is good 7. Anticipated functional outcomes upon discharge from inpatient rehab are modified independent and supervision  with PT, modified independent and supervision with OT, n/a with SLP. 8. Estimated rehab length of stay to reach the above functional goals is: 7-10 days 9. Does the patient have adequate social supports to accommodate these discharge functional goals? No and Potentially 10. Anticipated D/C setting: Home 11. Anticipated post D/C treatments: HH therapy and Outpatient therapy 12. Overall Rehab/Functional Prognosis: excellent  RECOMMENDATIONS: This patient's condition is appropriate for continued rehabilitative care in the following setting: see below Patient has agreed to participate in recommended program. Potentially Note that insurance prior authorization may be required for reimbursement for recommended care.  Comment: Pt is functionally appropriate for our program. However, it appears that she lacks the social supports necessary to discharge home from CIR. Rehab Admissions Coordinator to follow up.  Thanks,  Meredith Staggers, MD, Mellody Drown     05/11/2014

## 2014-05-11 NOTE — Anesthesia Postprocedure Evaluation (Signed)
  Anesthesia Post-op Note  Patient: Brianna [name]  Procedure(s) Performed: Procedure(s): INTRAMEDULLARY (IM) NAIL FEMORAL (Right)  Patient Location: PACU  Anesthesia Type:Spinal  Level of Consciousness: awake, alert  and oriented  Airway and Oxygen Therapy: Patient Spontanous Breathing and Patient connected to nasal cannula oxygen  Post-op Pain: none  Post-op Assessment: Post-op Vital signs reviewed, Patient's Cardiovascular Status Stable, Respiratory Function Stable, Patent Airway and Pain level controlled  Post-op Vital Signs: stable  Last Vitals:  Filed Vitals:   05/11/14 0051  BP: 101/80  Pulse: 89  Temp: 36.4 C  Resp:     Complications: No apparent anesthesia complications

## 2014-05-11 NOTE — Progress Notes (Signed)
Utilization review completed.  

## 2014-05-11 NOTE — Progress Notes (Signed)
INITIAL NUTRITION ASSESSMENT  Pt meets criteria for SEVERE MALNUTRITION in the context of chronic illness as evidenced by severe fat and muscle mass loss.  DOCUMENTATION CODES Per approved criteria  -Severe malnutrition in the context of chronic illness   INTERVENTION: Provide Ensure Complete po TID, each supplement provides 350 kcal and 13 grams of protein.  Encourage PO intake.  NUTRITION DIAGNOSIS: Malnutrition related to chronic illness as evidenced by severe fat and muscle mass loss.   Goal: Pt to meet >/= 90% of their estimated nutrition needs   Monitor:  PO intake, weight trends, labs, I/O's  Reason for Assessment: MST  80 y.o. female  Admitting Dx: Hip Fx  ASSESSMENT: Pt presenting with fall and subsequent hip pain and deformity . PMH is significant for ventricular tachycardia, insomnia, defibrillator placed 4-5 years ago, asthma, h/o breast cancer, chronic diastolic heart failure, MDD, recurrent, HLD, HTN, NICM, and osteoporosis.  PROCEDURE 10/18: Treatment of intertrochanteric fracture with intramedullary implant.  Pt reports her appetite is good. Pt also reports her appetite prior to admission was also good. Meal completion is 50%. Pt reports she is unable to finish her meal tray as she reports there is "too much food". Pt reports no recent weight loss with her usual body weight of 188 lbs, however pt Epic weight records, pt with a 5% weight loss in one month. Will continue to monitor. Pt is willing to try Ensure. Will order. Pt was encouraged to eat her food at meals.  Nutrition Focused Physical Exam:  Subcutaneous Fat:  Orbital Region: N/A Upper Arm Region: Severe depletion Thoracic and Lumbar Region: WNL  Muscle:  Temple Region: N/A Clavicle Bone Region: Severe depletion Clavicle and Acromion Bone Region: Severe depletion Scapular Bone Region: N/A Dorsal Hand: Severe depletion Patellar Region: N/A Anterior Thigh Region: N/A Posterior Calf Region:  N/A  Edema: none  Height: Ht Readings from Last 1 Encounters:  05/10/14 5\' 1"  (1.549 m)    Weight: Wt Readings from Last 1 Encounters:  05/10/14 118 lb (53.524 kg)    Ideal Body Weight: 105 lbs  % Ideal Body Weight: 112%  Wt Readings from Last 10 Encounters:  05/10/14 118 lb (53.524 kg)  05/10/14 118 lb (53.524 kg)  03/24/14 124 lb (56.246 kg)  01/19/14 122 lb (55.339 kg)  10/13/13 126 lb (57.153 kg)  07/28/13 126 lb 11.2 oz (57.471 kg)  04/23/13 125 lb (56.7 kg)  04/16/13 126 lb 9.6 oz (57.425 kg)  02/18/13 127 lb 14.4 oz (58.015 kg)  04/02/12 128 lb 1.6 oz (58.106 kg)    Usual Body Weight: 118 lbs  % Usual Body Weight: 100%  BMI:  Body mass index is 22.31 kg/(m^2).  Estimated Nutritional Needs: Kcal: 1500-1800 Protein: 65-75 grams Fluid: 1.5 - 1.8 L/day  Skin: incision right hip  Diet Order: Cardiac  EDUCATION NEEDS: -No education needs identified at this time   Intake/Output Summary (Last 24 hours) at 05/11/14 1140 Last data filed at 05/11/14 1100  Gross per 24 hour  Intake 2856.25 ml  Output   1400 ml  Net 1456.25 ml    Last BM: 10/17  Labs:   Recent Labs Lab 05/09/14 2034 05/10/14 0525 05/10/14 1910 05/11/14 0645  NA 134* 135*  --  137  K 3.9 4.6  --  4.1  CL 96 99  --  105  CO2 24 25  --  23  BUN 16 20  --  16  CREATININE 0.88 0.86 0.84 0.75  CALCIUM 8.6 8.3*  --  7.1*  GLUCOSE 119* 169*  --  100*    CBG (last 3)  No results found for this basename: GLUCAP,  in the last 72 hours  Scheduled Meds: . calcium-vitamin D  1 tablet Oral Q breakfast  . carvedilol  12.5 mg Oral BID WC  .  ceFAZolin (ANCEF) IV  2 g Intravenous Q6H  . enoxaparin (LOVENOX) injection  40 mg Subcutaneous Q24H  . FLUoxetine  10 mg Oral QHS  . mometasone-formoterol  2 puff Inhalation BID  . senna  1 tablet Oral BID  . sodium chloride  3 mL Intravenous Q12H    Continuous Infusions: . sodium chloride      Past Medical History  Diagnosis Date  .  Emphysema of lung   . Cancer     breast  . Depression   . Hypertension   . Hyperlipidemia   . Osteopenia   . Nonischemic cardiomyopathy   . Automatic implantable cardiac defibrillator in situ     Past Surgical History  Procedure Laterality Date  . Breast surgery      mastectomy  . Appendectomy  1943  . Tonsillectomy  1940  . Cardiac defibrillator placement  10/13/2004    Medtronic Maximo VR, model [id number], serial [id number]  . Pacemaker insertion  06/17/2010    Medtronic [place] VR, model #D334VRG, serial [id number]  . Cardiac catheterization  12/18/2002    Normal coronary arteries and normal LV function    Kallie Locks, MS, RD, LDN Pager # [phone] After hours/ weekend pager # [phone]

## 2014-05-11 NOTE — Progress Notes (Signed)
   Subjective:  Patient reports pain as mild.  No events.  Objective:   VITALS:   Filed Vitals:   05/11/14 0051 05/11/14 0400 05/11/14 0642 05/11/14 0749  BP: 101/80  104/56   Pulse: 89  86   Temp: 97.6 F (36.4 C)  97.7 F (36.5 C)   TempSrc: Axillary     Resp:  16    Height:      Weight:      SpO2: 95% 95% 98% 98%    Neurologically intact Neurovascular intact Sensation intact distally Intact pulses distally Dorsiflexion/Plantar flexion intact Incision: dressing C/D/I and no drainage No cellulitis present Compartment soft   Lab Results  Component Value Date   WBC 6.3 05/11/2014   HGB 8.3* 05/11/2014   HCT 24.9* 05/11/2014   MCV 92.6 05/11/2014   PLT 123* 05/11/2014     Assessment/Plan:  1 Day Post-Op   - Expected postop acute blood loss anemia - will monitor for symptoms - Up with PT/OT - DVT ppx - SCDs, ambulation, lovenox - WBAT right lower extremity - Pain control - Discharge planning - likely will need SNF   Marianna Payment 05/11/2014, 7:59 AM [phone]

## 2014-05-11 NOTE — Progress Notes (Addendum)
Patient Name: Brianna [name] Date of Encounter: 05/11/2014  Active Problems:   Hip fracture, intertrochanteric   Hip fracture requiring operative repair   Chronic diastolic heart failure   Nonischemic cardiomyopathy    Patient Profile: 80 yo female w/ hx NICM (?chemo), EF 45-50% w/ diast dysf by echo 12/2012, wt generally > 120, MDT ICD, was admitted 10/18 after a mech fall w/ hip fx. Cards saw preop, cleared for surgery 10/18.  SUBJECTIVE: Breathing OK, 750 cc on incentive spirometry, no chest pain, resp seem at baseline, OK pain control.  OBJECTIVE Filed Vitals:   05/11/14 0051 05/11/14 0400 05/11/14 0642 05/11/14 0749  BP: 101/80  104/56   Pulse: 89  86   Temp: 97.6 F (36.4 C)  97.7 F (36.5 C)   TempSrc: Axillary     Resp:  16    Height:      Weight:      SpO2: 95% 95% 98% 98%    Intake/Output Summary (Last 24 hours) at 05/11/14 1103 Last data filed at 05/11/14 0800  Gross per 24 hour  Intake 2616.25 ml  Output   1400 ml  Net 1216.25 ml   Filed Weights   05/09/14 2029 05/10/14 0114  Weight: 118 lb (53.524 kg) 118 lb (53.524 kg)    PHYSICAL EXAM General: Well developed, well nourished, female in no acute distress. Head: Normocephalic, atraumatic.  Neck: Supple without bruits, JVD minimal elevation. Lungs:  Resp regular and unlabored, few rales. Heart: RRR, S1, S2, no S3, S4, 2/6 murmur; no rub. Abdomen: Soft, non-tender, non-distended, BS + x 4.  Extremities: No clubbing, cyanosis, no edema.  Neuro: Alert and oriented X 3. Moves all extremities spontaneously. Psych: Normal affect.  LABS: CBC: Recent Labs  05/09/14 2034  05/10/14 1910 05/11/14 0645  WBC 7.1  < > 10.2 6.3  NEUTROABS 4.6  --   --   --   HGB 11.9*  < > 9.7* 8.3*  HCT 34.8*  < > 28.9* 24.9*  MCV 87.9  < > 89.8 92.6  PLT 177  < > 150 123*  < > = values in this interval not displayed. INR:  Recent Labs  05/09/14 2034  INR 2.94   Basic Metabolic Panel:  Recent Labs  05/10/14 0525 05/10/14 1910 05/11/14 0645  NA 135*  --  137  K 4.6  --  4.1  CL 99  --  105  CO2 25  --  23  GLUCOSE 169*  --  100*  BUN 20  --  16  CREATININE 0.86 0.84 0.75  CALCIUM 8.3*  --  7.1*    Recent Labs  05/09/14 2045  TROPIPOC 0.00   TELE:  SR/ST, no sig ectopy  Radiology/Studies: Dg Chest 1 View 05/09/2014   CLINICAL DATA:  Right hip fracture following a fall tonight. Preop evaluation.  EXAM: CHEST - 1 VIEW  COMPARISON:  06/17/2010.  FINDINGS: Mildly enlarged cardiac silhouette with a mild increase in size. Stable right subclavian AICD leads. Mild diffuse increase in prominence of the interstitial markings. Diffuse osteopenia. Stable scoliosis. Stable left axillary surgical clips and surgically absent left breast.  IMPRESSION: 1. No acute abnormality. 2. Mildly progressive cardiomegaly. 3. Progressive chronic interstitial lung disease.   Electronically Signed   By: Enrique Sack M.D.   On: 05/09/2014 22:39   Dg Hip Complete Right 05/09/2014   CLINICAL DATA:  Patient fell backwards in her kitchen earlier tonight, right hip pain, initial evaluation  EXAM:  RIGHT HIP - COMPLETE 2+ VIEW  COMPARISON:  None.  FINDINGS: Comminuted intertrochanteric right femur fracture with severe varus angulation.  IMPRESSION: Femur fracture   Electronically Signed   By: Skipper Cliche M.D.   On: 05/09/2014 22:37   Dg Hip Operative Right 05/10/2014   CLINICAL DATA:  80 year old female status post hip fixation  EXAM: DG OPERATIVE right HIP 1-2 VIEWS  TECHNIQUE: Fluoroscopic spot image(s) were submitted for interpretation post-operatively.  COMPARISON:  Plain film 05/09/2014  FINDINGS: Intraoperative fluoroscopic spot images of the right hip demonstrate antegrade intra medullary rod placement gamma nail and interference screw fixation at site of previously identified inner trochanteric hip fracture. No immediate complicating feature.  IMPRESSION: Intraoperative fluoroscopic spot images of right  intertrochanteric hip fracture fixation with antegrade intra medullary rod, and locking screw fixation. No immediate complicating feature.  Signed,  Dulcy Fanny. Earleen Newport, DO  [facility]  [facility]   Electronically Signed   By: Corrie Mckusick D.O.   On: 05/10/2014 17:40   Dg Femur Right 05/09/2014   CLINICAL DATA:  Fall is evening right hip pain initial evaluation  EXAM: RIGHT FEMUR - 2 VIEW  COMPARISON:  None.  FINDINGS: Intertrochanteric right femur fracture, comminuted. Significant varus angulation. No other abnormalities involving the femur.  IMPRESSION: Right femur fracture   Electronically Signed   By: Skipper Cliche M.D.   On: 05/09/2014 22:38   Ct Head Wo Contrast 05/09/2014   CLINICAL DATA:  Patient at dizzy and fell backwards and hit back of head this evening, no loss of consciousness or headache, no neck pain, initial evaluation  EXAM: CT HEAD WITHOUT CONTRAST  CT CERVICAL SPINE WITHOUT CONTRAST  TECHNIQUE: Multidetector CT imaging of the head and cervical spine was performed following the standard protocol without intravenous contrast. Multiplanar CT image reconstructions of the cervical spine were also generated.  COMPARISON:  None.  FINDINGS: CT HEAD FINDINGS  Moderate diffuse atrophy. Mild low attenuation in the deep white matter. No evidence of vascular territory infarct or mass. No hemorrhage or extra-axial fluid. Calvarium intact.  CT CERVICAL SPINE FINDINGS  Calcification in the upper mediastinum likely partial volume averaging with calcified aortic arch. Extensive pleural parenchymal scarring in both lung apices. No evidence of cervical spine fracture. Normal anterior posterior cervical spine alignment. No prevertebral soft tissue swelling. Posterior elements C2 through C4 appear fused. Significant multilevel degenerative disc disease particularly in the central cervical spine.  IMPRESSION: No acute abnormality involving the cervical spine. No  acute intracranial abnormality.   Electronically Signed   By: Skipper Cliche M.D.   On: 05/09/2014 22:44   Ct Cervical Spine Wo Contrast 05/09/2014   CLINICAL DATA:  Patient at dizzy and fell backwards and hit back of head this evening, no loss of consciousness or headache, no neck pain, initial evaluation  EXAM: CT HEAD WITHOUT CONTRAST  CT CERVICAL SPINE WITHOUT CONTRAST  TECHNIQUE: Multidetector CT imaging of the head and cervical spine was performed following the standard protocol without intravenous contrast. Multiplanar CT image reconstructions of the cervical spine were also generated.  COMPARISON:  None.  FINDINGS: CT HEAD FINDINGS  Moderate diffuse atrophy. Mild low attenuation in the deep white matter. No evidence of vascular territory infarct or mass. No hemorrhage or extra-axial fluid. Calvarium intact.  CT CERVICAL SPINE FINDINGS  Calcification in the upper mediastinum likely partial volume averaging with calcified aortic arch. Extensive pleural parenchymal scarring in both lung apices. No evidence of cervical spine fracture. Normal  anterior posterior cervical spine alignment. No prevertebral soft tissue swelling. Posterior elements C2 through C4 appear fused. Significant multilevel degenerative disc disease particularly in the central cervical spine.  IMPRESSION: No acute abnormality involving the cervical spine. No acute intracranial abnormality.   Electronically Signed   By: Skipper Cliche M.D.   On: 05/09/2014 22:44   Current Medications:  . calcium-vitamin D  1 tablet Oral Q breakfast  . carvedilol  12.5 mg Oral BID WC  .  ceFAZolin (ANCEF) IV  2 g Intravenous Q6H  . enoxaparin (LOVENOX) injection  40 mg Subcutaneous Q24H  . FLUoxetine  10 mg Oral QHS  . mometasone-formoterol  2 puff Inhalation BID  . senna  1 tablet Oral BID  . sodium chloride  3 mL Intravenous Q12H   . sodium chloride 75 mL/hr at 05/11/14 0600  . sodium chloride      ASSESSMENT AND PLAN: Active Problems:    Hip fracture, intertrochanteric - per ortho/IM    Hip fracture requiring operative repair - per ortho/IM    Chronic diastolic heart failure - PTA on Lasix 20 mg daily. Volume status OK now, discontinue IVF since taking POs, track weight. I/O + 1.5 L since admit.    Nonischemic cardiomyopathy - on BB, restart ACE when BP will allow.    Signed, Rosaria Ferries , PA-C 11:03 AM 05/11/2014   Patient seen and examined. Agree with assessment and plan. Tolerated hip surgery. No chest pain. No overt CHF on exam. Will obtain f/u post op ECG. Post op anemia; f/u cbc.   Troy Sine, MD, [facility] 05/11/2014 1:50 PM

## 2014-05-12 ENCOUNTER — Encounter (HOSPITAL_COMMUNITY): Payer: Self-pay | Admitting: Orthopaedic Surgery

## 2014-05-12 DIAGNOSIS — E43 Unspecified severe protein-calorie malnutrition: Secondary | ICD-10-CM | POA: Insufficient documentation

## 2014-05-12 LAB — BASIC METABOLIC PANEL
ANION GAP: 12 (ref 5–15)
BUN: 16 mg/dL (ref 6–23)
CHLORIDE: 103 meq/L (ref 96–112)
CO2: 20 meq/L (ref 19–32)
Calcium: 7.7 mg/dL — ABNORMAL LOW (ref 8.4–10.5)
Creatinine, Ser: 0.65 mg/dL (ref 0.50–1.10)
GFR calc Af Amer: >90 mL/min (ref >90)
GFR calc non Af Amer: 81 mL/min — ABNORMAL LOW (ref >90)
Glucose, Bld: 105 mg/dL — ABNORMAL HIGH (ref 70–99)
Potassium: 3.9 mEq/L (ref 3.7–5.3)
Sodium: 135 mEq/L — ABNORMAL LOW (ref 137–147)

## 2014-05-12 LAB — CBC
HEMATOCRIT: 22.9 % — AB (ref 36.0–46.0)
Hemoglobin: 7.8 g/dL — ABNORMAL LOW (ref 12.0–15.0)
MCH: 31.1 pg (ref 26.0–34.0)
MCHC: 34.1 g/dL (ref 30.0–36.0)
MCV: 91.2 fL (ref 78.0–100.0)
PLATELETS: 127 10*3/uL — AB (ref 150–400)
RBC: 2.51 MIL/uL — ABNORMAL LOW (ref 3.87–5.11)
RDW: 13.7 % (ref 11.5–15.5)
WBC: 7.5 10*3/uL (ref 4.0–10.5)

## 2014-05-12 LAB — VITAMIN D 25 HYDROXY (VIT D DEFICIENCY, FRACTURES): Vit D, 25-Hydroxy: 56 ng/mL (ref 30–89)

## 2014-05-12 MED ORDER — CARVEDILOL 25 MG PO TABS: 12.5000 mg | ORAL_TABLET | Freq: Two times a day (BID) | ORAL | Status: DC

## 2014-05-12 MED ORDER — SENNA 8.6 MG PO TABS: 1.0000 | ORAL_TABLET | Freq: Two times a day (BID) | ORAL | Status: DC

## 2014-05-12 MED ORDER — ACETAMINOPHEN 500 MG PO TABS
500.0000 mg | ORAL_TABLET | Freq: Four times a day (QID) | ORAL | Status: DC
  Administered 2014-05-12 – 2014-05-14 (×6): 500 mg via ORAL
  Filled 2014-05-12 (×10): qty 1

## 2014-05-12 MED ORDER — OXYCODONE HCL 5 MG PO TABS
5.0000 mg | ORAL_TABLET | ORAL | Status: DC | PRN
  Administered 2014-05-13 (×2): 5 mg via ORAL
  Filled 2014-05-12 (×2): qty 1

## 2014-05-12 MED ORDER — ENSURE COMPLETE PO LIQD: 237.0000 mL | Freq: Three times a day (TID) | ORAL | Status: DC

## 2014-05-12 MED ORDER — FLUTICASONE-SALMETEROL 100-50 MCG/DOSE IN AEPB: 1.0000 | INHALATION_SPRAY | Freq: Every day | RESPIRATORY_TRACT | Status: DC

## 2014-05-12 MED ORDER — FUROSEMIDE 20 MG PO TABS
20.0000 mg | ORAL_TABLET | Freq: Every day | ORAL | Status: DC
  Administered 2014-05-12 – 2014-05-14 (×3): 20 mg via ORAL
  Filled 2014-05-12 (×3): qty 1

## 2014-05-12 MED ORDER — GUAIFENESIN ER 600 MG PO TB12
600.0000 mg | ORAL_TABLET | Freq: Two times a day (BID) | ORAL | Status: DC | PRN
  Administered 2014-05-12: 600 mg via ORAL
  Filled 2014-05-12: qty 1

## 2014-05-12 MED ORDER — POLYETHYLENE GLYCOL 3350 17 G PO PACK: 17.0000 g | PACK | Freq: Every day | ORAL | Status: DC | PRN

## 2014-05-12 MED ORDER — FUROSEMIDE 20 MG PO TABS: 20.0000 mg | ORAL_TABLET | Freq: Every day | ORAL | Status: DC

## 2014-05-12 MED ORDER — OXYCODONE HCL 5 MG PO TABS: 5.0000 mg | ORAL_TABLET | ORAL | Status: DC | PRN

## 2014-05-12 NOTE — Care Management Note (Signed)
CARE MANAGEMENT NOTE 05/12/2014  Patient:  Brianna [name], Brianna [name]   Account Number:  [id number]  Date Initiated:  05/12/2014  Documentation initiated by:  Ricki Miller  Subjective/Objective Assessment:   80 yr old female admitted s/p fall with a right hip fracture. Patient had a right hip IM Nailing.     Action/Plan:   Patient will require shortterm rehab at [facility]. Patient has selected [facility] [employer] Social worker has arranged.   Anticipated DC Date:  05/13/2014   Anticipated DC Plan:  SKILLED NURSING FACILITY  In-house referral  Clinical Social Worker      DC Planning Services  CM consult      Choice offered to / List presented to:  NA   DME arranged  NA        [place] arranged  NA      Status of service:  Completed, signed off Medicare Important Message given?  YES (If response is "NO", the following Medicare IM given date fields will be blank) Date Medicare IM given:  05/12/2014 Medicare IM given by:   Date Additional Medicare IM given:   Additional Medicare IM given by:    Discharge Disposition:  [place]  Per UR Regulation:  Reviewed for med. necessity/level of care/duration of stay  If discussed at [place] of Stay Meetings, dates discussed:    Comments:  05/12/14 3:56pm Ricki Miller, RN BSN Case Manager Patient received IM by an admissions representative.

## 2014-05-12 NOTE — Progress Notes (Signed)
OT Cancellation Note  Patient Details Name: Brianna [name] MRN: [id number] DOB: 03-12-1930   Cancelled Treatment:    Reason Eval/Treat Not Completed: OT screened, no acute needs identified, will sign off.  Pt is Medicare and current D/C plan is SNF. No apparent immediate acute care OT needs, therefore will defer OT to SNF. If OT eval is needed please call Acute Rehab Dept. at [phone] or text page OT at [phone].    Benito Mccreedy OTR/L [phone] 05/12/2014, 8:34 AM

## 2014-05-12 NOTE — Progress Notes (Signed)
I discussed with  Dr Phelps.  I agree with their plans documented in their progress note.  

## 2014-05-12 NOTE — Progress Notes (Signed)
Rehab admissions - I met with pt in follow up to rehab MD consult and explained the possibility of inpatient rehab. Rehab MD is recommending that pt have supervision after a possible rehab stay and pt shared that she does not have the support to provide her any supervision. Her children live in [place] and [place] area. She does have some friends in the [place] area but no one is available to provide 24 hr care. I explained that skilled nursing would be the next option to provide a longer rehab stay. Pt was in agreement for pursuing SNF. I shared that I would call her dtr as well.  I spoke with pt's daughter Keitha [place]. Dtr was in agreement for SNF and further questions were answered. I explained that social work would follow up with pt/family for discharge planning.  I called and updated Stanton Kidney, case Freight forwarder and Raquel Sarna with social work.  I will now sign off pt's case. We are recommending that SNF be pursued in light of no family support.  Please call me with any questions. Thanks.  Nanetta Batty, PT Rehabilitation Admissions Coordinator [phone]

## 2014-05-12 NOTE — Clinical Social Work Placement (Addendum)
Clinical Social Work Department CLINICAL SOCIAL WORK PLACEMENT NOTE 05/12/2014  Patient:  Brianna [name], Brianna [name]  Account Number:  [id number] Admit date:  05/09/2014  Clinical Social Worker:  Delrae Sawyers  Date/time:  05/12/2014 01:15 PM  Clinical Social Work is seeking post-discharge placement for this patient at the following level of care:   [place]   (*CSW will update this form in Epic as items are completed)   05/12/2014  Patient/family provided with [place] Department of Clinical Social Work's list of facilities offering this level of care within the geographic area requested by the patient (or if unable, by the patient's family).  05/12/2014  Patient/family informed of their freedom to choose among providers that offer the needed level of care, that participate in Medicare, Medicaid or managed care program needed by the patient, have an available bed and are willing to accept the patient.  05/12/2014  Patient/family informed of [facility]' ownership interest in [facility], as well as of the fact that they are under no obligation to receive care at this facility.  PASARR submitted to EDS on 05/12/2014 PASARR number received on 05/12/2014  FL2 transmitted to all facilities in geographic area requested by pt/family on  05/12/2014 FL2 transmitted to all facilities within larger geographic area on   Patient informed that his/her managed care company has contracts with or will negotiate with  certain facilities, including the following:     Patient/family informed of bed offers received:  05/12/2014 Patient chooses bed at  [facility] Physician recommends and patient chooses bed at    Patient to be transferred to  [facility] on  05/13/2014 Patient to be transferred to facility by [facility] Patient and family notified of transfer on 05/13/2014 Name of family member notified:  Pt and pt's daughter updated regarding discharge.  The following  physician request were entered in Epic:   Additional Comments:  Henderson [place] ([phone]) Licensed Clinical Social Worker Orthopedics [phone]) and Surgical [phone])

## 2014-05-12 NOTE — Clinical Social Work Psychosocial (Signed)
Clinical Social Work Department BRIEF PSYCHOSOCIAL ASSESSMENT 05/12/2014  Patient:  Brianna [name], Brianna [name]     Account Number:  [id number]     Admit date:  05/09/2014  Clinical Social Worker:  Delrae Sawyers  Date/Time:  05/12/2014 01:05 PM  Referred by:  Physician  Date Referred:  05/12/2014 Referred for  SNF Placement   Other Referral:   none.   Interview type:  Patient Other interview type:   none.    PSYCHOSOCIAL DATA Living Status:  ALONE Admitted from facility:   Level of care:   Primary support name:  Johnny, neighbor Primary support relationship to patient:  FRIEND Degree of support available:   Pt states her daughter and son live out of town, and pt relies on neighbor Biomedical engineer) for support at home.    CURRENT CONCERNS Current Concerns  Post-Acute Placement   Other Concerns:   none.    SOCIAL WORK ASSESSMENT / PLAN CSW met with pt at bedside to discuss discharge disposition. Pt stated she is from home alone and agrees with PT recommendation for SNF placement at time of discharge. Pt informed CSW pt has no preference for SNF and is open to [facility] options. CSW to continue to follow and assist with discharge planning needs.   Assessment/plan status:  Psychosocial Support/Ongoing Assessment of Needs Other assessment/ plan:   none.   Information/referral to community resources:   [facility] bed offers.    PATIENT'S/FAMILY'S RESPONSE TO PLAN OF CARE: Pt understanding and agreeable to CSW plan of care. Pt expressed no further questions or concerns at this time.       [name], [place] ([phone]) Licensed Clinical Social Worker Orthopedics [phone]) and Surgical [phone])

## 2014-05-12 NOTE — Discharge Instructions (Signed)
1. Change dressings as needed 2. Take lovenox to prevent blood clots post-surgery for hip fracture 3. Take pain meds as needed, hold if feeling lightheaded or overly sleepy 4. See medication changes - decrease the carvedilol until further notified. 5. Follow up for wound check with Dr Erlinda Hong as listed. 6. Stop aspirin and enalapril until further notified by your doctor. 7. Discuss with primary doctor starting calcium, vitamin D, and a bisphosphonate for bone strength.  Discharge to SNF

## 2014-05-12 NOTE — Progress Notes (Signed)
   Subjective:  Patient reports pain as mild.  No events.  Objective:   VITALS:   Filed Vitals:   05/12/14 0319 05/12/14 0642 05/12/14 0800 05/12/14 1200  BP:  118/51    Pulse:  96    Temp:  98.7 F (37.1 C)    TempSrc:  Oral    Resp: 15 18 18    Height:      Weight:    65 kg (143 lb 4.8 oz)  SpO2: 97% 100% 98%     Incision c/d/i   Lab Results  Component Value Date   WBC 7.5 05/12/2014   HGB 7.8* 05/12/2014   HCT 22.9* 05/12/2014   MCV 91.2 05/12/2014   PLT 127* 05/12/2014     Assessment/Plan:  2 Days Post-Op   - SNF pending - up with PT - dressing changed   Marianna Payment 05/12/2014, 12:52 PM [phone]

## 2014-05-12 NOTE — Progress Notes (Signed)
Physical Therapy Treatment Patient Details Name: Brianna [name] MRN: [id number] DOB: 10-13-1929 Today's Date: 05/12/2014    History of Present Illness Brianna [name] is a 80 y.o. female who complains of right hip fx s/p mechanical fall Now s/p surgical fixation of fx; WBAT    PT Comments    Slow to progress.  Needs lots of encouragement to get up and stay up.  Follow Up Recommendations  SNF     Equipment Recommendations  Rolling walker with 5" wheels;3in1 (PT)    Recommendations for Other Services       Precautions / Restrictions Precautions Precautions: Fall Restrictions Weight Bearing Restrictions: Yes RLE Weight Bearing: Weight bearing as tolerated    Mobility  Bed Mobility Overal bed mobility: Needs Assistance Bed Mobility: Supine to Sit;Sit to Supine     Supine to sit: Mod assist Sit to supine: Max assist   General bed mobility comments: cues for step by step sequencing  Transfers Overall transfer level: Needs assistance Equipment used: Rolling walker (2 wheeled) Transfers: Sit to/from Stand Sit to Stand: Mod assist (x2 so staff could change her bed and do pericare)         General transfer comment: Cues for hand position, safety, and putting most of her weight on RLE during transition to help with LLE pain  Ambulation/Gait             General Gait Details: refused due to dizzy   Stairs            Wheelchair Mobility    Modified Rankin (Stroke Patients Only)       Balance Overall balance assessment: Needs assistance Sitting-balance support: No upper extremity supported Sitting balance-Leahy Scale: Fair       Standing balance-Leahy Scale: Poor                      Cognition Arousal/Alertness: Awake/alert Behavior During Therapy: WFL for tasks assessed/performed Overall Cognitive Status: Within Functional Limits for tasks assessed                      Exercises General Exercises - Lower  Extremity Ankle Circles/Pumps: AROM;15 reps;Supine;Both Quad Sets: AROM;Both;10 reps;Supine Heel Slides: AAROM;Both;10 reps;Supine Hip ABduction/ADduction: AAROM;10 reps;Right;Supine    General Comments General comments (skin integrity, edema, etc.): Reinforced getting OOB and if not sitting straight up in bed to combat the effects of bedrest.      Pertinent Vitals/Pain Pain Assessment: Faces Faces Pain Scale: Hurts even more Pain Location: R hip Pain Descriptors / Indicators: Jabbing;Sharp Pain Intervention(s): Limited activity within patient's tolerance    Home Living                      Prior Function            PT Goals (current goals can now be found in the care plan section) Acute Rehab PT Goals PT Goal Formulation: With patient Time For Goal Achievement: 05/25/14 Potential to Achieve Goals: Good Progress towards PT goals: Progressing toward goals    Frequency  Min 6X/week    PT Plan Current plan remains appropriate    Co-evaluation             End of Session   Activity Tolerance: Patient tolerated treatment well Patient left: in bed;with call bell/phone within reach;with family/visitor present     Time: 1709-1730 PT Time Calculation (min): 21 min  Charges:  $Therapeutic Activity: 8-22 mins  G Codes:      Brianna [name], Brianna [name] 05/12/2014, 5:39 PM 05/12/2014  Brianna [name], Brianna [name] [phone]  (pager)

## 2014-05-12 NOTE — Progress Notes (Signed)
Patient ID: AYZA RIPOLL, female   DOB: 06-May-1930, 80 y.o.   MRN: [id number] Family Medicine Teaching Service Daily Progress Note Intern Pager: [phone]  Patient name: Brianna [name] Medical record number: [id number] Date of birth: 1930-07-10 Age: 80 y.o. Gender: female  Primary Care Provider: Donnamae Jude, MD Consultants: Ortho, Cards Code Status: DNR  Pt Overview and Major Events to Date:  10/17: Admitted for fall and resultant femur fracture 10/18: Patient went to OR   Assessment and Plan: Brianna [name] is a 80 y.o. female presenting with fall and subsequent hip pain and deformity . PMH is significant for ventricular tachycardia, insomnia, defibrillator placed 4-5 years ago, asthma, h/o breast cancer, chronic diastolic heart failure, MDD, recurrent, HLD, HTN, NICM, and osteoporosis.   Hip fracture  DG hip with comminuted intertrochanteric right femur fracture with severe varus angulation. Tender to palpation. Patient taken to OR for intramedullary nail femoral (R). - ortho consulted; appreciate recs - following patient post-op -post-op anemia noted - will monitor Hbg and transfuse as needed; Hbg today 7.8 - Pain control: morphine q2 prn, oxycodone q4 prn, norco q6 prn, fentanyl - Halved coreg to 12.5mg  BID  -Foley removed - patient will likely need placement - Hold aspirin  -bowel regimen with miralax and senna -anticoagulation with Lovenox  Fall  No syncope, reports she just fell with no preceding symptoms. Head and c-spine CT unremarkable. Neg CXR. Trop neg. 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 Patient able to do all ADLs prior to admission. - PT/OT consult; appreciate recs -> likely SNF vs CIR - Continuous pulse oximetry  -patient up with assistance  Chronic diastolic heart failure - PTA on Lasix 20 mg daily. Volume status OK now,  -discontinue IVF since taking POs -daily weight -strict I/O  - on BB, restart ACE when BP will allow.    Osteoporosis  - No prior diagnosis but likely due to femur fracture from fall. -started Vit. D and Calcium -consider bisphosphonate   Psych  - Continue home prozac 10mg  qhs   Asthma  - Continue formulary alternative dulera and albuterol PRN   HTN, h/o NICM  - Continue coreg at decreased dose 12.5mg  BID, hold lasix and enalapril as pt is NPO   FEN/GI: heart Healthy diet; KVO fluids Prophylaxis: SCD    Disposition: Continue current management; dispo pending palcement  Subjective:  Patient doing well this morning. Still complaining of some hip pain. She has not been up ambulating yet. She has her foley out and has been using a bed pan. We discussed CIR vs SNF placement and patient is agreeable to go.  She stays at home alone and was previously able to do all her ADLs without assistance. She would like to return home eventually and says that she has neighbors that look out for her but no family in the area.  Objective: Temp:  [97.7 F (36.5 C)-98.7 F (37.1 C)] 98.7 F (37.1 C) (10/20 0642) Pulse Rate:  [77-96] 96 (10/20 0642) Resp:  [15-18] 18 (10/20 0642) BP: (99-118)/(47-55) 118/51 mmHg (10/20 0642) SpO2:  [95 %-100 %] 100 % (10/20 0642) FiO2 (%):  [21 %] 21 % (10/19 1855) Physical Exam: General: NAD, lying in bed, thin appearing female  Cardiovascular: RRR, no m/r/g, pulses intact  Respiratory: CTAB, normal effot  Abdomen: S/NT/ND  Extremities: No LE edema or calf tenderness; SCDs and TED hose on bilateral legs Neuro: A&Ox3, no focal deficits  Laboratory:  Recent Labs Lab 05/10/14 1910 05/11/14  0645 05/12/14 0615  WBC 10.2 6.3 7.5  HGB 9.7* 8.3* 7.8*  HCT 28.9* 24.9* 22.9*  PLT 150 123* 127*    Recent Labs Lab 05/10/14 0525 05/10/14 1910 05/11/14 0645 05/12/14 0615  NA 135*  --  137 135*  K 4.6  --  4.1 3.9  CL 99  --  105 103  CO2 25  --  23 20  BUN 20  --  16 16  CREATININE 0.86 0.84 0.75 0.65  CALCIUM 8.3*  --  7.1* 7.7*  GLUCOSE 169*  --  100*  105*    Imaging/Diagnostic Tests: Dg Chest 1 View 05/09/2014  IMPRESSION: 1. No acute abnormality. 2. Mildly progressive cardiomegaly. 3. Progressive chronic interstitial lung disease.   Dg Hip Complete Right 05/09/2014  IMPRESSION: Femur fracture     Dg Hip Operative Right 05/10/2014  IMPRESSION: Intraoperative fluoroscopic spot images of right intertrochanteric hip fracture fixation with antegrade intra medullary rod, and locking screw fixation. No immediate complicating feature.  Ct Head Wo Contrast 05/09/2014  IMPRESSION: No acute abnormality involving the cervical spine. No acute intracranial abnormality.    Ct Cervical Spine Wo Contrast 05/09/2014   IMPRESSION: No acute abnormality involving the cervical spine. No acute intracranial abnormality.     Katheren Shams, DO 05/12/2014, 9:32 AM PGY-1, [facility] Intern pager: [phone], text pages welcome

## 2014-05-12 NOTE — Progress Notes (Signed)
Patient Name: Brianna [name] Date of Encounter: 05/12/2014  Active Problems:   Hip fracture, intertrochanteric   Hip fracture requiring operative repair   Chronic diastolic heart failure   Nonischemic cardiomyopathy   Protein-calorie malnutrition, severe    Patient Profile: 80 yo female w/ hx NICM (?chemo), EF 45-50% w/ diast dysf by echo 12/2012, wt generally > 120, MDT ICD, was admitted 06/06/2023 after a mech fall w/ hip fx. Cards saw preop, cleared for surgery 06-06-2023.   SUBJECTIVE: Feels weak, denies SOB. Coughing is painful.  OBJECTIVE Filed Vitals:   05/11/14 2134 05/11/14 2339 05/12/14 0319 05/12/14 0642  BP: 112/55   118/51  Pulse: 86   96  Temp: 97.7 F (36.5 C)   98.7 F (37.1 C)  TempSrc: Axillary   Oral  Resp:  15 15 18   Height:      Weight:      SpO2: 95% 96% 97% 100%    Intake/Output Summary (Last 24 hours) at 05/12/14 1041 Last data filed at 05/12/14 0914  Gross per 24 hour  Intake    837 ml  Output      0 ml  Net    837 ml   Filed Weights   05/09/14 2029 June 05, 2014 0114  Weight: 118 lb (53.524 kg) 118 lb (53.524 kg)    PHYSICAL EXAM General: Well developed, well nourished, female in no acute distress. Head: Normocephalic, atraumatic.  Neck: Supple without bruits, JVD at 9 cm. Lungs:  Resp regular and unlabored, rales bases. Heart: RRR, S1, S2, no S3, S4, or murmur; no rub. Abdomen: Soft, non-tender, non-distended, BS + x 4.  Extremities: No clubbing, cyanosis, no edema.  Neuro: Alert and oriented X 3. Moves all extremities spontaneously. Psych: Normal affect.  LABS: CBC: Recent Labs  05/09/14 2034  05/11/14 0645 05/12/14 0615  WBC 7.1  < > 6.3 7.5  NEUTROABS 4.6  --   --   --   HGB 11.9*  < > 8.3* 7.8*  HCT 34.8*  < > 24.9* 22.9*  MCV 87.9  < > 92.6 91.2  PLT 177  < > 123* 127*  < > = values in this interval not displayed. INR: Recent Labs  05/09/14 2034  INR 0.45   Basic Metabolic Panel: Recent Labs  05/11/14 0645  05/12/14 0615  NA 137 135*  K 4.1 3.9  CL 105 103  CO2 23 20  GLUCOSE 100* 105*  BUN 16 16  CREATININE 0.75 0.65  CALCIUM 7.1* 7.7*    Recent Labs  05/09/14 2045  TROPIPOC 0.00    TELE:  SR, PVCs, multiform at times.       Radiology/Studies: Dg Hip Operative Right  June 05, 2014   CLINICAL DATA:  80 year old female status post hip fixation  EXAM: DG OPERATIVE right HIP 1-2 VIEWS  TECHNIQUE: Fluoroscopic spot image(s) were submitted for interpretation post-operatively.  COMPARISON:  Plain film 05/09/2014  FINDINGS: Intraoperative fluoroscopic spot images of the right hip demonstrate antegrade intra medullary rod placement gamma nail and interference screw fixation at site of previously identified inner trochanteric hip fracture. No immediate complicating feature.  IMPRESSION: Intraoperative fluoroscopic spot images of right intertrochanteric hip fracture fixation with antegrade intra medullary rod, and locking screw fixation. No immediate complicating feature.  Signed,  Dulcy Fanny. Earleen Newport, DO  [facility]  [facility]   Electronically Signed   By: Corrie Mckusick D.O.   On: 06/05/2014 17:40     Current Medications:  .  calcium-vitamin D  1 tablet Oral Q breakfast  . carvedilol  12.5 mg Oral BID WC  . enoxaparin (LOVENOX) injection  40 mg Subcutaneous Q24H  . feeding supplement (ENSURE COMPLETE)  237 mL Oral TID BM  . FLUoxetine  10 mg Oral QHS  . mometasone-formoterol  2 puff Inhalation BID  . senna  1 tablet Oral BID  . sodium chloride  3 mL Intravenous Q12H   . sodium chloride 10 mL/hr at 05/11/14 1520    ASSESSMENT AND PLAN: Active Problems:  Hip fracture, intertrochanteric - per ortho/IM, to go to facility for rehab  Hip fracture requiring operative repair - per ortho/IM   Chronic diastolic heart failure - PTA on Lasix 20 mg daily. Volume status up some now, off  IVF since taking POs, track weight. I/O + 2.4 L since admit. Will  restart Lasix, PO for now, may need IV Rx  Nonischemic cardiomyopathy - on BB, restart ACE when BP will allow.   Post-op anemia - per IM/Ortho, H&H trending down, may need transfusion  Protein-calorie malnutrition, severe - per IM   Signed, Rosaria Ferries , PA-C 10:41 AM 05/12/2014   Patient seen and examined. Agree with assessment and plan. I/O + 837 yesterday, +360 so far today and +2550 since admission. No chest pain. BS decreased at bases; rare ectopy on auscultation. Will resume oral lasix at 20 mg daily.   Troy Sine, MD, [facility] 05/12/2014 3:19 PM

## 2014-05-12 NOTE — Discharge Summary (Signed)
[facility] Discharge Summary  Patient name: Brianna [name] Medical record number: [id number] Date of birth: February 19, 1930 Age: 80 y.o. Gender: female Date of Admission: 05/09/2014  Date of Discharge: 05/14/2014 Admitting Physician: Zigmund Gottron, MD  Primary Care Provider: Donnamae Jude, MD Consultants: Ortho  Indication for Hospitalization: Fall with resultant right femur fracture  Discharge Diagnoses/Problem List:  Patient Active Problem List   Diagnosis Date Noted  . Protein-calorie malnutrition, severe 05/12/2014  . Hip fracture, intertrochanteric 05/10/2014  . Hip fracture requiring operative repair 05/10/2014  . Nonischemic cardiomyopathy 03/24/2014  . Dyspnea 08/03/2013  . Automatic implantable cardiac defibrillator in situ 04/16/2013  . Asthma 09/28/2010  . CYSTOCELE WITHOUT MENTION UTERINE PROLAPSE LAT 10/12/2008  . BREAST CANCER, HX OF 11/05/2007  . INSOMNIA-SLEEP DISORDER-UNSPEC 07/10/2007  . HYPERCHOLESTEROLEMIA 09/20/2006  . DEPRESSION, MAJOR, RECURRENT 09/20/2006  . HYPERTENSION, BENIGN SYSTEMIC 09/20/2006  . VENTRICULAR TACHYCARDIA 09/20/2006  . Chronic diastolic heart failure 24/58/0998  . PSORIASIS 09/20/2006  . OSTEOPOROSIS, UNSPECIFIED 09/20/2006    Disposition: Discharge to SNF  Discharge Condition: Stable  Discharge Exam:  General: NAD, lying in bed, thin appearing female  Cardiovascular: RRR, no m/r/g, pulses intact  Respiratory: CTAB, normal effot  Abdomen: S/NT/ND  Extremities: No LE edema or calf tenderness; SCDs and TED hose on bilateral legs  Neuro: A&Ox3, no focal deficits  Brief Hospital Course:  Brianna [name] is a 80 y.o. female who presented with fall and subsequent hip pain and deformity . PMH is significant for ventricular tachycardia, insomnia, defibrillator placed 4-5 years ago, asthma, h/o breast cancer, chronic diastolic heart failure, MDD, recurrent, HLD, HTN, NICM, and osteoporosis.    Her hospital course by problem is listed below:  Hip fracture -DG hip with comminuted intertrochanteric right femur fracture with severe varus angulation. Ortho was consulted and patient taken to OR for intramedullary nail femoral (R). Anticoagulation with Lovenox. Post-op anemia noted and patient hemodynamically stable. Pain controlled.   Fall -No syncope, reports she just fell with no preceding symptoms. Head and c-spine CT unremarkable. Neg CXR. Trop neg. 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 Patient was able to do all ADLs prior to admission. PT?OT consulted and recommended SNF.   Chronic diastolic heart failure - PTA on Lasix 20 mg daily. Volume status at baseline. On BB, held ACE due to low BPs. Osteoporosis - No prior diagnosis but likely due to femur fracture from fall. Started Vit. D and Calcium. Consider bisphosphonate.  Psych - Continued home prozac Asthma - Continued formulary alternative dulera and albuterol PRN  HTN, h/o NICM - Continued coreg at decreased dose due to patient having low pressures during admission. Also held lasix and enalapril. Patient had soft BPs during this admission and one near syncopal episode that required her to stay another night in the hospital s/p 565mL bolus.    Issues for Follow Up:  1. Continue Lovenox to prevent clots 2. Post-op Ortho follow-up appointment (2 weeks) 3. Monitor pain control 4. Monitor hemoglobin but stable with typical post-op anemia 5. Monitor BPs and add back medications as needed  Significant Procedures: Treatment of intertrochanteric fracture with intramedullary implant.  Significant Labs and Imaging:   Recent Labs Lab 05/11/14 0645 05/12/14 0615 05/13/14 0419  WBC 6.3 7.5 6.9  HGB 8.3* 7.8* 7.7*  HCT 24.9* 22.9* 22.4*  PLT 123* 127* 155    Recent Labs Lab 05/09/14 2034 05/10/14 0525 05/10/14 1910 05/11/14 0645 05/12/14 0615 05/13/14 0419  NA 134* 135*  --  137 135* 137  K 3.9 4.6  --  4.1  3.9 3.9  CL 96 99  --  105 103 102  CO2 24 25  --  23 20 27   GLUCOSE 119* 169*  --  100* 105* 99  BUN 16 20  --  16 16 12   CREATININE 0.88 0.86 0.84 0.75 0.65 0.75  CALCIUM 8.6 8.3*  --  7.1* 7.7* 8.0*   Dg Chest 1 View  05/09/2014 IMPRESSION: 1. No acute abnormality. 2. Mildly progressive cardiomegaly. 3. Progressive chronic interstitial lung disease.  Dg Hip Complete Right  05/09/2014 IMPRESSION: Femur fracture  Dg Hip Operative Right  05/10/2014 IMPRESSION: Intraoperative fluoroscopic spot images of right intertrochanteric hip fracture fixation with antegrade intra medullary rod, and locking screw fixation. No immediate complicating feature.  Ct Head Wo Contrast  05/09/2014 IMPRESSION: No acute abnormality involving the cervical spine. No acute intracranial abnormality.  Ct Cervical Spine Wo Contrast  05/09/2014 IMPRESSION: No acute abnormality involving the cervical spine. No acute intracranial abnormality.   Results/Tests Pending at Time of Discharge: None  Discharge Medications:    Medication List    STOP taking these medications       aspirin 81 MG EC tablet     enalapril 2.5 MG tablet  Commonly known as:  VASOTEC      TAKE these medications       CALTRATE 600+D 600-400 MG-UNIT per tablet  Generic drug:  Calcium Carbonate-Vitamin D  Take 1 tablet by mouth daily.     carvedilol 3.125 MG tablet  Commonly known as:  COREG  Take 1 tablet (3.125 mg total) by mouth 2 (two) times daily with a meal.     enoxaparin 40 MG/0.4ML injection  Commonly known as:  LOVENOX  Inject 0.4 mLs (40 mg total) into the skin daily.     feeding supplement (ENSURE COMPLETE) Liqd  Take 237 mLs by mouth 3 (three) times daily between meals.     FLUoxetine 10 MG capsule  Commonly known as:  PROZAC  Take 10 mg by mouth at bedtime.     Fluticasone-Salmeterol 100-50 MCG/DOSE Aepb  Commonly known as:  ADVAIR  Inhale 1 puff into the lungs daily.     furosemide 20 MG tablet  Commonly  known as:  LASIX  Take 1 tablet (20 mg total) by mouth daily.     Ginkgo Biloba 40 MG Tabs  Take 1 tablet by mouth every evening.     MULTI-VITAMIN DAILY PO  Take 1 tablet by mouth at bedtime.     oxyCODONE 5 MG immediate release tablet  Commonly known as:  Oxy IR/ROXICODONE  Take 1-3 tablets (5-15 mg total) by mouth every 4 (four) hours as needed.     polyethylene glycol packet  Commonly known as:  MIRALAX / GLYCOLAX  Take 17 g by mouth daily as needed for mild constipation.     PROAIR HFA 108 (90 BASE) MCG/ACT inhaler  Generic drug:  albuterol  Inhale 2 puffs into the lungs every 6 (six) hours as needed for wheezing or shortness of breath.     senna 8.6 MG Tabs tablet  Commonly known as:  SENOKOT  Take 1 tablet (8.6 mg total) by mouth 2 (two) times daily.        Discharge Instructions: Please refer to Patient Instructions section of EMR for full details.  Patient was counseled important signs and symptoms that should prompt return to medical care, changes in medications, dietary instructions, activity restrictions, and  follow up appointments.   Follow-Up Appointments: Follow-up Information   Follow up with Marianna Payment, MD In 2 weeks. (For suture removal, For wound re-check)    Specialty:  Orthopedic Surgery   Contact information:   [place] [phone]       Schedule an appointment as soon as possible for a visit with Donnamae Jude, MD. (As needed)    Specialty:  Obstetrics and Gynecology   Contact information:   [place] [id number] [place], DO 05/14/2014, 9:08 AM PGY-1, [place]

## 2014-05-13 LAB — BASIC METABOLIC PANEL
Anion gap: 8 (ref 5–15)
BUN: 12 mg/dL (ref 6–23)
CALCIUM: 8 mg/dL — AB (ref 8.4–10.5)
CO2: 27 meq/L (ref 19–32)
CREATININE: 0.75 mg/dL (ref 0.50–1.10)
Chloride: 102 mEq/L (ref 96–112)
GFR calc Af Amer: 90 mL/min — ABNORMAL LOW (ref >90)
GFR, EST NON AFRICAN AMERICAN: 77 mL/min — AB (ref >90)
GLUCOSE: 99 mg/dL (ref 70–99)
Potassium: 3.9 mEq/L (ref 3.7–5.3)
Sodium: 137 mEq/L (ref 137–147)

## 2014-05-13 LAB — CBC
HCT: 22.4 % — ABNORMAL LOW (ref 36.0–46.0)
HEMOGLOBIN: 7.7 g/dL — AB (ref 12.0–15.0)
MCH: 31.3 pg (ref 26.0–34.0)
MCHC: 34.4 g/dL (ref 30.0–36.0)
MCV: 91.1 fL (ref 78.0–100.0)
Platelets: 155 10*3/uL (ref 150–400)
RBC: 2.46 MIL/uL — AB (ref 3.87–5.11)
RDW: 13.8 % (ref 11.5–15.5)
WBC: 6.9 10*3/uL (ref 4.0–10.5)

## 2014-05-13 LAB — PRO B NATRIURETIC PEPTIDE: Pro B Natriuretic peptide (BNP): 1882 pg/mL — ABNORMAL HIGH (ref 0–450)

## 2014-05-13 MED ORDER — SODIUM CHLORIDE 0.9 % IV BOLUS (SEPSIS)
250.0000 mL | Freq: Once | INTRAVENOUS | Status: AC
  Administered 2014-05-13: 250 mL via INTRAVENOUS

## 2014-05-13 MED ORDER — CARVEDILOL 3.125 MG PO TABS
3.1250 mg | ORAL_TABLET | Freq: Two times a day (BID) | ORAL | Status: DC
  Administered 2014-05-14: 3.125 mg via ORAL
  Filled 2014-05-13 (×4): qty 1

## 2014-05-13 NOTE — Progress Notes (Signed)
Dressing c/d/i Sitting up in chair SNF pending F/u 2 weeks  N. Eduard Roux, MD [place] 12:53 PM

## 2014-05-13 NOTE — Progress Notes (Signed)
Patient ID: Brianna [name], female   DOB: 1930-01-01, 80 y.o.   MRN: [id number] Family Medicine Teaching Service Daily Progress Note Intern Pager: [phone]  Patient name: Brianna [name] Medical record number: [id number] Date of birth: Apr 03, 1930 Age: 80 y.o. Gender: female  Primary Care Provider: Donnamae Jude, MD Consultants: Ortho, Cards Code Status: DNR  Pt Overview and Major Events to Date:  10/17: Admitted for fall and resultant femur fracture 10/18: Patient went to OR   Assessment and Plan: CALLEN ZUBA is a 80 y.o. female presenting with fall and subsequent hip pain and deformity . PMH is significant for ventricular tachycardia, insomnia, defibrillator placed 4-5 years ago, asthma, h/o breast cancer, chronic diastolic heart failure, MDD, recurrent, HLD, HTN, NICM, and osteoporosis.   Hip fracture  DG hip with comminuted intertrochanteric right femur fracture with severe varus angulation. Tender to palpation. Patient taken to OR for intramedullary nail femoral (R). - ortho consulted; appreciate recs - following patient post-op -post-op anemia noted - will monitor Hbg and transfuse as needed; Hbg today 7.8 - Pain control: morphine q2 prn, oxycodone q4 prn, norco q6 prn, fentanyl - Decreased coreg to 3.125mg   -Foley removed - patient will likely need placement - Hold aspirin  -bowel regimen with miralax and senna -anticoagulation with Lovenox  Fall  No syncope, reports she just fell with no preceding symptoms. Head and c-spine CT unremarkable. Neg CXR. Trop neg. 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 Patient able to do all ADLs prior to admission. - PT/OT consult; appreciate recs -> recommended SNF - Continuous pulse oximetry  -patient up with assistance -patient got up and experienced a feeling of lightheadedness; no syncopal event occurred and she did not fall.   Chronic diastolic heart failure - PTA on Lasix 20 mg daily. Volume status OK now,   -discontinue IVF since taking POs -daily weight -strict I/O  - on BB, restart ACE when BP will allow.   Osteoporosis  - No prior diagnosis but likely due to femur fracture from fall. -started Vit. D and Calcium -consider bisphosphonate   Psych  - Continue home prozac 10mg  qhs   Asthma  - Continue formulary alternative dulera and albuterol PRN   HTN, h/o NICM  - Continue coreg at decreased dose 3.125mg  BID, hold lasix and enalapril -patient bolus x1 due to low blood pressures and lightheadedness with standing  FEN/GI: heart Healthy diet; KVO fluids Prophylaxis: SCD    Disposition: Continue current management; dispo pending improvement in pressures. She will need to be observed after episode of dizziness and hypotension. Possible d/c SNF tomorrow.  Subjective:  Patient doing well this morning. No overnight events.  Objective: Temp:  [98.1 F (36.7 C)-99.1 F (37.3 C)] 98.1 F (36.7 C) (10/21 0439) Pulse Rate:  [76-90] 87 (10/21 0439) Resp:  [16-18] 18 (10/21 0439) BP: (86-135)/(43-60) 91/43 mmHg (10/21 1205) SpO2:  [94 %-98 %] 96 % (10/21 0439) Weight:  [131 lb 9.8 oz (59.7 kg)] 131 lb 9.8 oz (59.7 kg) (10/21 0439) Physical Exam: General: NAD, lying in bed, thin appearing female  Cardiovascular: RRR, no m/r/g, pulses intact  Respiratory: CTAB, normal effot  Abdomen: S/NT/ND  Extremities: No LE edema or calf tenderness; SCDs and TED hose on bilateral legs Neuro: A&Ox3, no focal deficits  Laboratory:  Recent Labs Lab 05/11/14 0645 05/12/14 0615 05/13/14 0419  WBC 6.3 7.5 6.9  HGB 8.3* 7.8* 7.7*  HCT 24.9* 22.9* 22.4*  PLT 123* 127* 155    Recent  Labs Lab 05/11/14 0645 05/12/14 0615 05/13/14 0419  NA 137 135* 137  K 4.1 3.9 3.9  CL 105 103 102  CO2 23 20 27   BUN 16 16 12   CREATININE 0.75 0.65 0.75  CALCIUM 7.1* 7.7* 8.0*  GLUCOSE 100* 105* 99    Imaging/Diagnostic Tests: Dg Chest 1 View 05/09/2014  IMPRESSION: 1. No acute abnormality. 2. Mildly  progressive cardiomegaly. 3. Progressive chronic interstitial lung disease.   Dg Hip Complete Right 05/09/2014  IMPRESSION: Femur fracture     Dg Hip Operative Right 05/10/2014  IMPRESSION: Intraoperative fluoroscopic spot images of right intertrochanteric hip fracture fixation with antegrade intra medullary rod, and locking screw fixation. No immediate complicating feature.  Ct Head Wo Contrast 05/09/2014  IMPRESSION: No acute abnormality involving the cervical spine. No acute intracranial abnormality.    Ct Cervical Spine Wo Contrast 05/09/2014   IMPRESSION: No acute abnormality involving the cervical spine. No acute intracranial abnormality.     Katheren Shams, DO 05/13/2014, 12:15 PM PGY-1, [place] Intern pager: [phone], text pages welcome

## 2014-05-13 NOTE — Progress Notes (Signed)
Patient had "dizzy" spell during PT session. BP checked at 1140  86/48. Rechecked at 1150 BP 84/45, and again at 1200 91/43. MD paged and made aware. Orders placed for 250 cc bolus. BP at 1330 97/57.

## 2014-05-13 NOTE — Clinical Social Work Note (Signed)
Pt to be discharged to [facility]. Pt and pt's daughter, Jeanne Ivan, updated regarding discharge.  [place] [facility]: [phone] Transportation: EMS ([address]  [name], [place] [phone]) Licensed Clinical Social Worker Orthopedics [phone]) and Surgical [phone])

## 2014-05-13 NOTE — Progress Notes (Signed)
Physical Therapy Treatment Patient Details Name: LORYN HAACKE MRN: [id number] DOB: 1929/08/16 Today's Date: 05/13/2014    History of Present Illness SCARLETT PORTLOCK is a 80 y.o. female who complains of right hip fx s/p mechanical fall Now s/p surgical fixation of fx; WBAT    PT Comments    Making progress with mobility and amb, with more steps today, even with dizziness; Once sitting, BP 86/48, so it stands to reason that her BP was even lower when she was standing and symptomatic with dizziness; RN aware   Follow Up Recommendations  SNF     Equipment Recommendations  Rolling walker with 5" wheels;3in1 (PT)    Recommendations for Other Services       Precautions / Restrictions Precautions Precautions: Fall Precaution Comments: check Orthostatics Restrictions RLE Weight Bearing: Weight bearing as tolerated    Mobility  Bed Mobility                  Transfers Overall transfer level: Needs assistance Equipment used: Rolling walker (2 wheeled) Transfers: Sit to/from Stand Sit to Stand: Mod assist         General transfer comment: Cues for hand position, safety, and putting most of her weight on RLE during transition to help with LLE pain  Ambulation/Gait Ambulation/Gait assistance: Mod assist;Min assist;+2 safety/equipment Ambulation Distance (Feet): 30 Feet (15x2) Assistive device: Rolling walker (2 wheeled) Gait Pattern/deviations: Step-to pattern;Narrow base of support Gait velocity: quite slow   General Gait Details: Step-by-step cues for gait sequence, and to bear down on RW to unweign painful LLE during R foot advancement; Amb distance limited by pt dizziness   Stairs            Wheelchair Mobility    Modified Rankin (Stroke Patients Only)       Balance             Standing balance-Leahy Scale: Poor                      Cognition Arousal/Alertness: Awake/alert Behavior During Therapy: WFL for tasks  assessed/performed Overall Cognitive Status: Within Functional Limits for tasks assessed                      Exercises      General Comments        Pertinent Vitals/Pain Pain Assessment: 0-10 Pain Score: 10-Worst pain ever Pain Location: R hip Pain Descriptors / Indicators: Aching Pain Intervention(s): Repositioned;Patient requesting pain meds-RN notified    Home Living                      Prior Function            PT Goals (current goals can now be found in the care plan section) Acute Rehab PT Goals Patient Stated Goal: wants to get back to normal PT Goal Formulation: With patient Time For Goal Achievement: 05/25/14 Potential to Achieve Goals: Good Progress towards PT goals: Progressing toward goals    Frequency  Min 5X/week (decr freq -- will dc to SNF)    PT Plan Current plan remains appropriate    Co-evaluation             End of Session Equipment Utilized During Treatment: Gait belt Activity Tolerance: Other (comment) (limited by decr BP with upright activity) Patient left: in chair;with call bell/phone within reach;with nursing/sitter in room (Reclined in chair, feet up)     Time: 2440-1027 PT  Time Calculation (min): 24 min  Charges:  $Gait Training: 23-37 mins                    G Codes:      Roney Marion Hamff 05/13/2014, 12:50 PM  Roney Marion, [place] Pager [phone] Office [phone]

## 2014-05-13 NOTE — Clinical Social Work Note (Signed)
CSW had arranged for EMS ([facility]) pick-up at 12:30pm on 05/13/2014 to [facility]. CSW was contacted by pt's RN stating pt having difficulties with blood pressure and discharge was cancelled per MD. CSW cancelled EMS ([facility]) transportation and updated [name] regarding cancelled discharged for 05/13/2014.  CSW to continue to follow and assist with discharge to [facility] once pt is medically stable for discharge.  Brianna [name], [place] ([phone]) Licensed Clinical Social Worker Orthopedics [phone]) and Surgical [phone])

## 2014-05-14 DIAGNOSIS — M8580 Other specified disorders of bone density and structure, unspecified site: Secondary | ICD-10-CM | POA: Diagnosis not present

## 2014-05-14 DIAGNOSIS — S72143A Displaced intertrochanteric fracture of unspecified femur, initial encounter for closed fracture: Secondary | ICD-10-CM | POA: Diagnosis not present

## 2014-05-14 DIAGNOSIS — S72124D Nondisplaced fracture of lesser trochanter of right femur, subsequent encounter for closed fracture with routine healing: Secondary | ICD-10-CM | POA: Diagnosis not present

## 2014-05-14 DIAGNOSIS — R262 Difficulty in walking, not elsewhere classified: Secondary | ICD-10-CM | POA: Diagnosis not present

## 2014-05-14 DIAGNOSIS — I509 Heart failure, unspecified: Secondary | ICD-10-CM | POA: Diagnosis not present

## 2014-05-14 DIAGNOSIS — R278 Other lack of coordination: Secondary | ICD-10-CM | POA: Diagnosis not present

## 2014-05-14 DIAGNOSIS — F329 Major depressive disorder, single episode, unspecified: Secondary | ICD-10-CM | POA: Diagnosis not present

## 2014-05-14 DIAGNOSIS — K5900 Constipation, unspecified: Secondary | ICD-10-CM | POA: Diagnosis not present

## 2014-05-14 DIAGNOSIS — J439 Emphysema, unspecified: Secondary | ICD-10-CM | POA: Diagnosis not present

## 2014-05-14 DIAGNOSIS — N3000 Acute cystitis without hematuria: Secondary | ICD-10-CM | POA: Diagnosis not present

## 2014-05-14 DIAGNOSIS — M25551 Pain in right hip: Secondary | ICD-10-CM | POA: Diagnosis not present

## 2014-05-14 DIAGNOSIS — S72141A Displaced intertrochanteric fracture of right femur, initial encounter for closed fracture: Secondary | ICD-10-CM | POA: Diagnosis not present

## 2014-05-14 DIAGNOSIS — Z9181 History of falling: Secondary | ICD-10-CM | POA: Diagnosis not present

## 2014-05-14 DIAGNOSIS — S79911A Unspecified injury of right hip, initial encounter: Secondary | ICD-10-CM | POA: Diagnosis not present

## 2014-05-14 DIAGNOSIS — M6281 Muscle weakness (generalized): Secondary | ICD-10-CM | POA: Diagnosis not present

## 2014-05-14 DIAGNOSIS — I10 Essential (primary) hypertension: Secondary | ICD-10-CM | POA: Diagnosis not present

## 2014-05-14 DIAGNOSIS — J4530 Mild persistent asthma, uncomplicated: Secondary | ICD-10-CM | POA: Diagnosis not present

## 2014-05-14 DIAGNOSIS — I5032 Chronic diastolic (congestive) heart failure: Secondary | ICD-10-CM | POA: Diagnosis not present

## 2014-05-14 DIAGNOSIS — M79604 Pain in right leg: Secondary | ICD-10-CM | POA: Diagnosis not present

## 2014-05-14 DIAGNOSIS — S72001S Fracture of unspecified part of neck of right femur, sequela: Secondary | ICD-10-CM | POA: Diagnosis not present

## 2014-05-14 MED ORDER — CARVEDILOL 3.125 MG PO TABS: 3.1250 mg | ORAL_TABLET | Freq: Two times a day (BID) | ORAL | Status: DC

## 2014-05-14 NOTE — Clinical Social Work Note (Signed)
Pt to be discharged to [facility].   [facility]: [phone] Transportation: EMS ([facility])  Durward Fortes, BSW-Intern

## 2014-05-14 NOTE — Discharge Summary (Signed)
I discussed with  Dr Gerarda Fraction.  I agree with their plans documented in their discharge note.

## 2014-05-14 NOTE — Progress Notes (Signed)
I discussed with  Dr Phelps.  I agree with their plans documented in their progress note.  

## 2014-05-14 NOTE — Progress Notes (Signed)
Patient discharged to [facility], SNF. Report called to RN at facility. IV was removed and patient left unit in a stable condition with all personal belongings via EMS.

## 2014-05-14 NOTE — Progress Notes (Signed)
Ok to dc to SNF from ortho standpoint.  F/u 2 weeks in office  N. Eduard Roux, MD [place] 8:09 AM

## 2014-05-14 NOTE — Clinical Social Work Note (Signed)
[name], [place] [phone]) Licensed Clinical Social Worker Orthopedics [phone]) and Surgical [phone])

## 2014-05-15 ENCOUNTER — Non-Acute Institutional Stay (SKILLED_NURSING_FACILITY): Payer: Medicare Other | Admitting: Adult Health

## 2014-05-15 DIAGNOSIS — J4530 Mild persistent asthma, uncomplicated: Secondary | ICD-10-CM | POA: Diagnosis not present

## 2014-05-15 DIAGNOSIS — K5900 Constipation, unspecified: Secondary | ICD-10-CM

## 2014-05-15 DIAGNOSIS — I10 Essential (primary) hypertension: Secondary | ICD-10-CM | POA: Diagnosis not present

## 2014-05-15 DIAGNOSIS — S72143A Displaced intertrochanteric fracture of unspecified femur, initial encounter for closed fracture: Secondary | ICD-10-CM

## 2014-05-15 DIAGNOSIS — I5032 Chronic diastolic (congestive) heart failure: Secondary | ICD-10-CM | POA: Diagnosis not present

## 2014-05-15 DIAGNOSIS — F322 Major depressive disorder, single episode, severe without psychotic features: Secondary | ICD-10-CM

## 2014-05-15 DIAGNOSIS — F329 Major depressive disorder, single episode, unspecified: Secondary | ICD-10-CM

## 2014-05-18 ENCOUNTER — Encounter: Payer: Medicare Other | Admitting: *Deleted

## 2014-05-18 ENCOUNTER — Telehealth: Payer: Self-pay | Admitting: Cardiology

## 2014-05-18 NOTE — Telephone Encounter (Signed)
LMOVM reminding pt to send remote transmission.   

## 2014-05-19 ENCOUNTER — Encounter: Payer: Self-pay | Admitting: Adult Health

## 2014-05-19 DIAGNOSIS — J45909 Unspecified asthma, uncomplicated: Secondary | ICD-10-CM | POA: Insufficient documentation

## 2014-05-19 DIAGNOSIS — K5900 Constipation, unspecified: Secondary | ICD-10-CM | POA: Insufficient documentation

## 2014-05-19 DIAGNOSIS — F329 Major depressive disorder, single episode, unspecified: Secondary | ICD-10-CM | POA: Insufficient documentation

## 2014-05-19 MED ORDER — METHOCARBAMOL 500 MG PO TABS: 500.0000 mg | ORAL_TABLET | Freq: Three times a day (TID) | ORAL | Status: DC

## 2014-05-19 MED ORDER — POLYETHYLENE GLYCOL 3350 17 G PO PACK: 17.0000 g | PACK | Freq: Every day | ORAL | Status: DC

## 2014-05-19 NOTE — Progress Notes (Signed)
Patient ID: Brianna [name], female   DOB: [birth year], 80 y.o.   MRN: [id number]     [facility]  Allergies  Allergen Reactions  . Atorvastatin     Fatigue   . Claritin [Loratadine] Other (See Comments)    fatigue  . Crestor [Rosuvastatin]     Myalgias. Pt is tolerating Lipitor 20mg  QOD well.     Chief Complaint  Patient presents with  . Hospitalization Follow-up    HPI:  She had a fall at home and suffered a right femur fracture which required a nail procedure. She is here for short term rehab with her goal to return back home. She is complaining of constipation and is complaining of muscle spasms in her right hip.    Past Medical History  Diagnosis Date  . Emphysema of lung   . Cancer     breast  . Depression   . Hypertension   . Hyperlipidemia   . Osteopenia   . Nonischemic cardiomyopathy   . Automatic implantable cardiac defibrillator in situ     Past Surgical History  Procedure Laterality Date  . Breast surgery      mastectomy  . Appendectomy  1943  . Tonsillectomy  1940  . Cardiac defibrillator placement  10/13/2004    Medtronic Maximo VR, model [id number], serial [id number]  . Pacemaker insertion  06/17/2010    Medtronic [place] VR, model #[id number], serial [id number]  . Cardiac catheterization  12/18/2002    Normal coronary arteries and normal LV function  . Femur im nail Right 05/10/2014    Procedure: INTRAMEDULLARY (IM) NAIL FEMORAL;  Surgeon: Marianna Payment, MD;  Location: [facility];  Service: Orthopedics;  Laterality: Right;    VITAL SIGNS BP 132/78  Pulse 68  Ht 5\' 1"  (1.549 m)  Wt 141 lb (63.957 kg)  BMI 26.66 kg/m2   Patient's Medications  New Prescriptions   No medications on file  Previous Medications   ALBUTEROL (PROAIR HFA) 108 (90 BASE) MCG/ACT INHALER    Inhale 2 puffs into the lungs every 6 (six) hours as needed for wheezing or shortness of breath.   CALCIUM CARBONATE-VITAMIN D (CALTRATE 600+D) 600-400 MG-UNIT PER TABLET    Take  1 tablet by mouth daily.    CARVEDILOL (COREG) 3.125 MG TABLET    Take 1 tablet (3.125 mg total) by mouth 2 (two) times daily with a meal.   ENOXAPARIN (LOVENOX) 40 MG/0.4ML INJECTION    Inject 0.4 mLs (40 mg total) into the skin daily.   FEEDING SUPPLEMENT, ENSURE COMPLETE, (ENSURE COMPLETE) LIQD    Take 237 mLs by mouth 3 (three) times daily between meals.   FLUOXETINE (PROZAC) 10 MG CAPSULE    Take 10 mg by mouth at bedtime.   FLUTICASONE-SALMETEROL (ADVAIR) 100-50 MCG/DOSE AEPB    Inhale 1 puff into the lungs daily.   FUROSEMIDE (LASIX) 20 MG TABLET    Take 1 tablet (20 mg total) by mouth daily.   GINKGO BILOBA 40 MG TABS    Take 1 tablet by mouth every evening.    MULTIPLE VITAMIN (MULTI-VITAMIN DAILY PO)    Take 1 tablet by mouth at bedtime.    OXYCODONE (OXY IR/ROXICODONE) 5 MG IMMEDIATE RELEASE TABLET    Take 1-3 tablets (5-15 mg total) by mouth every 4 (four) hours as needed.   POLYETHYLENE GLYCOL (MIRALAX / GLYCOLAX) PACKET    Take 17 g by mouth daily as needed for mild constipation.   SENNA (SENOKOT) 8.6 MG TABS TABLET  Take 1 tablet (8.6 mg total) by mouth 2 (two) times daily.  Modified Medications   No medications on file  Discontinued Medications   No medications on file    SIGNIFICANT DIAGNOSTIC EXAMS  05-09-14: chest x-ray: 1. No acute abnormality. 2. Mildly progressive cardiomegaly. 3. Progressive chronic interstitial lung disease.  05-09-14: right femur x-ray; Right femur fracture   05-09-14: right hip x-ray: Femur fracture  05-09-14: ct of head and cervical spine: No acute abnormality involving the cervical spine. No acute intracranial abnormality.      LABS REVIEWED:  05-11-14: wbc 7.1; hgb 11.9; hct 31.8; mcv 87.9; plt 177; glucose 119; bun 16; creat 0.88; k+3.9; na++134 05-12-14: wbc 7.5; hgb 7.8; hct 22.9; mcv 91.2; plt 127; glucose 105; bun 16; creat 0.65; k+3.9; na++135; vit d 56 05-13-14: wbc 6.9; hgb 7.7; hct 22.4; mcv 91.1; plt 155; glucose 99; bun  12; creat 0.75; k+3.9;  na++ 137; BNP 1882      Review of Systems  Constitutional: Negative for malaise/fatigue.  Respiratory: Negative for cough and shortness of breath.   Cardiovascular: Negative for chest pain, palpitations and leg swelling.  Gastrointestinal: Positive for constipation. Negative for heartburn and abdominal pain.  Musculoskeletal: Positive for joint pain and myalgias.  Skin: Negative.   Psychiatric/Behavioral: Negative for depression. The patient does not have insomnia.      Physical Exam  Constitutional: She is oriented to person, place, and time. No distress.  Frail   Neck: Neck supple. No JVD present.  Cardiovascular: Normal rate, regular rhythm and intact distal pulses.   Respiratory: Effort normal and breath sounds normal. No respiratory distress. She has no wheezes.  GI: Soft. Bowel sounds are normal. She exhibits no distension. There is no tenderness.  Musculoskeletal: She exhibits no edema.  Is able to move all extremities; is status post right femur fracture.   Neurological: She is alert and oriented to person, place, and time.  Skin: Skin is warm and dry. She is not diaphoretic.  right hip incision line without signs of infection present; does have some bruising and a small small amount of edema present.       ASSESSMENT/ PLAN:  1. Femur fracture: will follow up with orthopedics as indicated. Will continue therapy as directed. Will complete 14 days of lovenox therapy. Will continue oxycodone 5-15 mg every 4 hours as needed for pain. Will begin robaxin 500 mg every 8 hours for muscle spasms and will continue to monitor her status   2. Chronic diastolic heart failure: is stable will continue lasix 20 mg daily and coreg 3.125 mg twice daily and will monitor is status post AICD   3. Asthma: is stable will continue advair 100/50 twice daily and albuterol inhaler 2 puffs every 6 hours as needed   4. Hypertension: is stable will continue coreg 3.125 mg  twice daily   5. Constipation: is worse; will continue senna twice daily and will change miralax to daily and will monitor   6. Major depression: she is presently stable will continue prozac 10 mg daily   Will check cbc on Monday    Time spent with patient 50 minutes.   Ok Edwards NP [facility]  Contact [phone] Monday through Friday 8am- 5pm  After hours call [phone]

## 2014-05-22 ENCOUNTER — Non-Acute Institutional Stay (SKILLED_NURSING_FACILITY): Payer: Medicare Other | Admitting: Internal Medicine

## 2014-05-22 DIAGNOSIS — S72001S Fracture of unspecified part of neck of right femur, sequela: Secondary | ICD-10-CM

## 2014-05-22 DIAGNOSIS — D500 Iron deficiency anemia secondary to blood loss (chronic): Secondary | ICD-10-CM

## 2014-05-22 DIAGNOSIS — M79604 Pain in right leg: Secondary | ICD-10-CM | POA: Diagnosis not present

## 2014-05-22 DIAGNOSIS — J4530 Mild persistent asthma, uncomplicated: Secondary | ICD-10-CM

## 2014-05-22 DIAGNOSIS — I5032 Chronic diastolic (congestive) heart failure: Secondary | ICD-10-CM

## 2014-05-22 DIAGNOSIS — K5900 Constipation, unspecified: Secondary | ICD-10-CM

## 2014-05-22 DIAGNOSIS — N3000 Acute cystitis without hematuria: Secondary | ICD-10-CM | POA: Diagnosis not present

## 2014-05-22 DIAGNOSIS — F329 Major depressive disorder, single episode, unspecified: Secondary | ICD-10-CM

## 2014-05-22 DIAGNOSIS — I10 Essential (primary) hypertension: Secondary | ICD-10-CM

## 2014-05-22 DIAGNOSIS — F322 Major depressive disorder, single episode, severe without psychotic features: Secondary | ICD-10-CM

## 2014-05-23 ENCOUNTER — Encounter: Payer: Self-pay | Admitting: Internal Medicine

## 2014-05-23 NOTE — Progress Notes (Signed)
Patient ID: Brianna [name], female   DOB: [birth year], 80 y.o.   MRN: [id number]     Facility: [facility]    PCP: Donnamae Jude, MD  Code Status: full code  Allergies  Allergen Reactions  . Atorvastatin     Fatigue   . Claritin [Loratadine] Other (See Comments)    fatigue  . Crestor [Rosuvastatin]     Myalgias. Pt is tolerating Lipitor 20mg  QOD well.    Chief Complaint: new admission  HPI:  80 y/o female patient is here for STR after hospital admission from 05/09/14- 05/14/14 post fall with right femur fracture. She underwent intermedullary nailing. Ct head and cardiac workup were negative. With her low bp her lasix and enalapril were held in hospital and coreg dosing reduced. At discharge her lasix was resumed. She has hx of chf, asthma, HLD, HTN, NICM and osteoporosis among others.  She is seen in her room today. She mentions that her pain is not under control and rates it as 8/10 a present. She also has muscle spasm at night. Continues to have constipation. She is on treatment for UTI with ciprofloxacin started 05/21/14.   Review of Systems:  Constitutional: Negative for fever, chills, diaphoresis. feels tired HENT: Negative for congestion, hearing loss and sore throat.   Eyes: Negative for eye pain, blurred vision, double vision and discharge.  Respiratory: Negative for cough, sputum production, shortness of breath and wheezing.   Cardiovascular: Negative for chest pain, palpitations, orthopnea and leg swelling.  Gastrointestinal: Negative for heartburn, nausea, vomiting, abdominal pain, diarrhea  Genitourinary: Negative for dysuria Musculoskeletal: Negative for back pain, falls.  Skin: Negative for itching and rash.  Neurological: Negative for dizziness and headaches.  Psychiatric/Behavioral: Negative for depression.     Past Medical History  Diagnosis Date  . Emphysema of lung   . Cancer     breast  . Depression   . Hypertension   .  Hyperlipidemia   . Osteopenia   . Nonischemic cardiomyopathy   . Automatic implantable cardiac defibrillator in situ    Past Surgical History  Procedure Laterality Date  . Breast surgery      mastectomy  . Appendectomy  1943  . Tonsillectomy  1940  . Cardiac defibrillator placement  10/13/2004    Medtronic Maximo VR, model Q7220614, serial [id number]  . Pacemaker insertion  06/17/2010    Medtronic [address] VR, model #D334VRG, serial [id number]  . Cardiac catheterization  12/18/2002    Normal coronary arteries and normal LV function  . Femur im nail Right 05/10/2014    Procedure: INTRAMEDULLARY (IM) NAIL FEMORAL;  Surgeon: Marianna Payment, MD;  Location: [place];  Service: Orthopedics;  Laterality: Right;   Social History:   reports that she has been smoking Cigarettes.  She has been smoking about 0.00 packs per day. She has never used smokeless tobacco. She reports that she drinks about .6 ounces of alcohol per week. She reports that she does not use illicit drugs.  Family History  Problem Relation Age of Onset  . Heart disease Mother     Pacemaker  . Heart disease Father     No details.    . Hyperlipidemia Sister   . Heart disease Brother     "Heart Attacks"  Died age 62  . Cancer Daughter     Oral cancer and "knot" on right cheek that was cancer    Medications: Patient's Medications  New Prescriptions   No medications on file  Previous Medications   ALBUTEROL (PROAIR HFA) 108 (90 BASE) MCG/ACT INHALER    Inhale 2 puffs into the lungs every 6 (six) hours as needed for wheezing or shortness of breath.   CALCIUM CARBONATE-VITAMIN D (CALTRATE 600+D) 600-400 MG-UNIT PER TABLET    Take 1 tablet by mouth daily.    CARVEDILOL (COREG) 3.125 MG TABLET    Take 1 tablet (3.125 mg total) by mouth 2 (two) times daily with a meal.   ENOXAPARIN (LOVENOX) 40 MG/0.4ML INJECTION    Inject 0.4 mLs (40 mg total) into the skin daily.   FEEDING SUPPLEMENT, ENSURE COMPLETE, (ENSURE COMPLETE)  LIQD    Take 237 mLs by mouth 3 (three) times daily between meals.   FLUOXETINE (PROZAC) 10 MG CAPSULE    Take 10 mg by mouth at bedtime.   FLUTICASONE-SALMETEROL (ADVAIR) 100-50 MCG/DOSE AEPB    Inhale 1 puff into the lungs daily.   FUROSEMIDE (LASIX) 20 MG TABLET    Take 1 tablet (20 mg total) by mouth daily.   GINKGO BILOBA 40 MG TABS    Take 1 tablet by mouth every evening.    METHOCARBAMOL (ROBAXIN) 500 MG TABLET    Take 1 tablet (500 mg total) by mouth 3 (three) times daily.   MULTIPLE VITAMIN (MULTI-VITAMIN DAILY PO)    Take 1 tablet by mouth at bedtime.    OXYCODONE (OXY IR/ROXICODONE) 5 MG IMMEDIATE RELEASE TABLET    Take 1-3 tablets (5-15 mg total) by mouth every 4 (four) hours as needed.   POLYETHYLENE GLYCOL (MIRALAX / GLYCOLAX) PACKET    Take 17 g by mouth daily.   SENNA (SENOKOT) 8.6 MG TABS TABLET    Take 1 tablet (8.6 mg total) by mouth 2 (two) times daily.  Modified Medications   No medications on file  Discontinued Medications   No medications on file     Physical Exam: Filed Vitals:   05/22/14 1314  BP: 104/58  Pulse: 72  Temp: 98.7 F (37.1 C)  Resp: 18  SpO2: 94%    General- elderly female in no acute distress Head- atraumatic, normocephalic Eyes- PERRLA, EOMI, no pallor, no icterus, no discharge Neck- no lymphadenopathy Throat- moist mucus membrane Cardiovascular- normal s1,s2, no murmurs Respiratory- bilateral clear to auscultation, has wheezing, no rhonchi, no crackles, no use of accessory muscles Abdomen- bowel sounds present, soft, non tender Musculoskeletal- able to move all 4 extremities, right leg trace edema, limited ROM at right hip area, on wheelchair Neurological- no focal deficit Skin- warm and dry, right hip superior and inferior incision site clean and healing well Psychiatry- alert and oriented to person, place and time, normal mood and affect  Labs reviewed: Basic Metabolic Panel:  Recent Labs  05/11/14 0645 05/12/14 0615  05/13/14 0419  NA 137 135* 137  K 4.1 3.9 3.9  CL 105 103 102  CO2 23 20 27   GLUCOSE 100* 105* 99  BUN 16 16 12   CREATININE 0.75 0.65 0.75  CALCIUM 7.1* 7.7* 8.0*   Liver Function Tests:  Recent Labs  01/19/14 0913  AST 29  ALT 16  ALKPHOS 72  BILITOT 0.51  PROT 7.6  ALBUMIN 4.0   No results found for this basename: LIPASE, AMYLASE,  in the last 8760 hours No results found for this basename: AMMONIA,  in the last 8760 hours CBC:  Recent Labs  01/19/14 0913 05/09/14 2034  05/11/14 0645 05/12/14 0615 05/13/14 0419  WBC 5.5 7.1  < > 6.3 7.5 6.9  NEUTROABS  3.6 4.6  --   --   --   --   HGB 13.0 11.9*  < > 8.3* 7.8* 7.7*  HCT 38.1 34.8*  < > 24.9* 22.9* 22.4*  MCV 88.8 87.9  < > 92.6 91.2 91.1  PLT 184 177  < > 123* 127* 155  < > = values in this interval not displayed.  Radiological Exams: Dg Chest 1 View   05/09/2014 IMPRESSION: 1. No acute abnormality. 2. Mildly progressive cardiomegaly. 3. Progressive chronic interstitial lung disease.   Dg Hip Complete Right   05/09/2014 IMPRESSION: Femur fracture   Dg Hip Operative Right   05/10/2014 IMPRESSION: Intraoperative fluoroscopic spot images of right intertrochanteric hip fracture fixation with antegrade intra medullary rod, and locking screw fixation. No immediate complicating feature.   Ct Head Wo Contrast   05/09/2014 IMPRESSION: No acute abnormality involving the cervical spine. No acute intracranial abnormality.   Ct Cervical Spine Wo Contrast   05/09/2014 IMPRESSION: No acute abnormality involving the cervical spine. No acute intracranial abnormality.    Assessment/Plan  Femur fracture S/p intermedullary nailing. Here for rehabilitation. Will have her work with physical therapy and occupational therapy team to help with gait training and muscle strengthening exercises.fall precautions. Skin care. Encourage to be out of bed. Has follow up with orthopedic for staple removal. Continue lovenox for dvt prophylaxis.  Continue incision care. Continue ca-vit d supplement  Right leg pain Pain with her fracture and is post op. With pain not under control with current regimen, change her pain regimen. Will have her on oxycodone extended release 10 mg bid and oxyIR 5 mg q6h prn for breakthrough pain. Reassess. Also change robaxin to 500 mg q6h for spasms and monitor   Constipation Change her miralax to 17 g qhs and add senna s 2 tab qhs. Monitor  uti Complete course of ciprofloxacin and florastor. Encouraged hydration  HTN bp on lower side of normal. Monitor closely. Continue coreg and lasix for now  CHF Euvolemic. Continue lasix and coreg, has defibrillator. ACEI on hold with low bp.  Asthma stable will continue advair 100/50 twice daily and albuterol inhaler 2 puffs every 6 hours as needed   Depression stable on prozac 10 mg daily   Anemia Likely post op with blood loss, check cbc  Family/ staff Communication: reviewed care plan with patient and nursing supervisor  Goals of care: short term rehabilitation   Labs/tests ordered: cbc, cmp    Blanchie Serve, MD  [facility] [phone] (Monday-Friday 8 am - 5 pm) [phone] (afterhours)

## 2014-05-24 DIAGNOSIS — M6281 Muscle weakness (generalized): Secondary | ICD-10-CM | POA: Diagnosis not present

## 2014-05-24 DIAGNOSIS — M8580 Other specified disorders of bone density and structure, unspecified site: Secondary | ICD-10-CM | POA: Diagnosis not present

## 2014-05-24 DIAGNOSIS — R262 Difficulty in walking, not elsewhere classified: Secondary | ICD-10-CM | POA: Diagnosis not present

## 2014-05-24 DIAGNOSIS — S72141A Displaced intertrochanteric fracture of right femur, initial encounter for closed fracture: Secondary | ICD-10-CM | POA: Diagnosis not present

## 2014-05-24 DIAGNOSIS — S72124D Nondisplaced fracture of lesser trochanter of right femur, subsequent encounter for closed fracture with routine healing: Secondary | ICD-10-CM | POA: Diagnosis not present

## 2014-05-24 DIAGNOSIS — J439 Emphysema, unspecified: Secondary | ICD-10-CM | POA: Diagnosis not present

## 2014-05-24 DIAGNOSIS — F329 Major depressive disorder, single episode, unspecified: Secondary | ICD-10-CM | POA: Diagnosis not present

## 2014-05-24 DIAGNOSIS — I509 Heart failure, unspecified: Secondary | ICD-10-CM | POA: Diagnosis not present

## 2014-05-24 DIAGNOSIS — S72143A Displaced intertrochanteric fracture of unspecified femur, initial encounter for closed fracture: Secondary | ICD-10-CM | POA: Diagnosis not present

## 2014-05-24 DIAGNOSIS — I10 Essential (primary) hypertension: Secondary | ICD-10-CM | POA: Diagnosis not present

## 2014-05-24 DIAGNOSIS — Z9181 History of falling: Secondary | ICD-10-CM | POA: Diagnosis not present

## 2014-05-24 DIAGNOSIS — R278 Other lack of coordination: Secondary | ICD-10-CM | POA: Diagnosis not present

## 2014-05-25 ENCOUNTER — Other Ambulatory Visit: Payer: Self-pay | Admitting: *Deleted

## 2014-05-25 ENCOUNTER — Encounter: Payer: Self-pay | Admitting: Cardiology

## 2014-05-25 ENCOUNTER — Other Ambulatory Visit: Payer: Self-pay | Admitting: Family Medicine

## 2014-05-25 MED ORDER — OXYCODONE HCL 5 MG PO TABS: ORAL_TABLET | ORAL | Status: DC

## 2014-05-25 MED ORDER — OXYCODONE HCL ER 10 MG PO T12A: EXTENDED_RELEASE_TABLET | ORAL | Status: DC

## 2014-05-25 NOTE — Telephone Encounter (Signed)
[facility] 

## 2014-05-28 ENCOUNTER — Telehealth: Payer: Self-pay | Admitting: Cardiovascular Disease

## 2014-05-28 DIAGNOSIS — S72141A Displaced intertrochanteric fracture of right femur, initial encounter for closed fracture: Secondary | ICD-10-CM | POA: Diagnosis not present

## 2014-06-01 ENCOUNTER — Non-Acute Institutional Stay (SKILLED_NURSING_FACILITY): Payer: Medicare Other | Admitting: Adult Health

## 2014-06-01 DIAGNOSIS — J439 Emphysema, unspecified: Secondary | ICD-10-CM | POA: Diagnosis not present

## 2014-06-01 NOTE — Telephone Encounter (Signed)
Closed encounter °

## 2014-06-05 ENCOUNTER — Non-Acute Institutional Stay (SKILLED_NURSING_FACILITY): Payer: Medicare Other | Admitting: Adult Health

## 2014-06-05 DIAGNOSIS — J439 Emphysema, unspecified: Secondary | ICD-10-CM | POA: Diagnosis not present

## 2014-06-05 DIAGNOSIS — I10 Essential (primary) hypertension: Secondary | ICD-10-CM | POA: Diagnosis not present

## 2014-06-05 DIAGNOSIS — S72143A Displaced intertrochanteric fracture of unspecified femur, initial encounter for closed fracture: Secondary | ICD-10-CM

## 2014-06-10 DIAGNOSIS — J449 Chronic obstructive pulmonary disease, unspecified: Secondary | ICD-10-CM | POA: Insufficient documentation

## 2014-06-10 DIAGNOSIS — I5032 Chronic diastolic (congestive) heart failure: Secondary | ICD-10-CM | POA: Diagnosis not present

## 2014-06-10 DIAGNOSIS — J439 Emphysema, unspecified: Secondary | ICD-10-CM | POA: Diagnosis not present

## 2014-06-10 DIAGNOSIS — S72141D Displaced intertrochanteric fracture of right femur, subsequent encounter for closed fracture with routine healing: Secondary | ICD-10-CM | POA: Diagnosis not present

## 2014-06-10 DIAGNOSIS — M6281 Muscle weakness (generalized): Secondary | ICD-10-CM | POA: Diagnosis not present

## 2014-06-10 DIAGNOSIS — J45909 Unspecified asthma, uncomplicated: Secondary | ICD-10-CM | POA: Diagnosis not present

## 2014-06-10 DIAGNOSIS — I10 Essential (primary) hypertension: Secondary | ICD-10-CM | POA: Diagnosis not present

## 2014-06-10 NOTE — Progress Notes (Signed)
Patient ID: Brianna [name], female   DOB: [birth year], 80 y.o.   MRN: [id number]    Brianna [name]   Allergies  Allergen Reactions  . Atorvastatin     Fatigue   . Claritin [Loratadine] Other (See Comments)    fatigue  . Crestor [Rosuvastatin]     Myalgias. Pt is tolerating Lipitor 20mg  QOD well.       Chief Complaint  Patient presents with  . Acute Visit    congestion     HPI:  She has a history of chronic asthma and emphysema is complaining of cough and congestion. She states she is prone to getting pneumonia easily. She states that she normally takes mucinex twice daily; claritin and flonase when this happens to prevent her from getting worse.  She states that she can tolerate claritin so long as she takes it at night time.    Past Medical History  Diagnosis Date  . Emphysema of lung   . Cancer     breast  . Depression   . Hypertension   . Hyperlipidemia   . Osteopenia   . Nonischemic cardiomyopathy   . Automatic implantable cardiac defibrillator in situ     Past Surgical History  Procedure Laterality Date  . Breast surgery      mastectomy  . Appendectomy  1943  . Tonsillectomy  1940  . Cardiac defibrillator placement  10/13/2004    Medtronic Maximo VR, model Q7220614, serial [id number]  . Pacemaker insertion  06/17/2010    Medtronic [place] VR, model #D334VRG, serial [id number]  . Cardiac catheterization  12/18/2002    Normal coronary arteries and normal LV function  . Femur im nail Right 05/10/2014    Procedure: INTRAMEDULLARY (IM) NAIL FEMORAL;  Surgeon: Marianna Payment, MD;  Location: [facility];  Service: Orthopedics;  Laterality: Right;    VITAL SIGNS BP 129/79 mmHg  Pulse 70  Ht 5\' 1"  (1.549 m)  Wt 141 lb (63.957 kg)  BMI 26.66 kg/m2   Outpatient Encounter Prescriptions as of 06/01/2014  Medication Sig  . ADVAIR DISKUS 100-50 MCG/DOSE AEPB INHALE 1 PUFF INTO THE LUNGS 2 (TWO) TIMES DAILY.  Marland Kitchen albuterol (PROAIR HFA) 108 (90 BASE) MCG/ACT inhaler  Inhale 2 puffs into the lungs every 6 (six) hours as needed for wheezing or shortness of breath.  . Calcium Carbonate-Vitamin D (CALTRATE 600+D) 600-400 MG-UNIT per tablet Take 1 tablet by mouth daily.   . carvedilol (COREG) 3.125 MG tablet Take 1 tablet (3.125 mg total) by mouth 2 (two) times daily with a meal.  . feeding supplement, ENSURE COMPLETE, (ENSURE COMPLETE) LIQD Take 237 mLs by mouth 3 (three) times daily between meals.  . ferrous sulfate 325 (65 FE) MG tablet Take 325 mg by mouth 2 (two) times daily with a meal.  . FLUoxetine (PROZAC) 10 MG capsule Take 10 mg by mouth at bedtime.  . Fluticasone-Salmeterol (ADVAIR) 100-50 MCG/DOSE AEPB Inhale 1 puff into the lungs daily.  . furosemide (LASIX) 20 MG tablet Take 1 tablet (20 mg total) by mouth daily.  . Ginkgo Biloba 40 MG TABS Take 1 tablet by mouth every evening.   . methocarbamol (ROBAXIN) 500 MG tablet Take 1 tablet (500 mg total) by mouth 3 (three) times daily.  . Multiple Vitamin (MULTI-VITAMIN DAILY PO) Take 1 tablet by mouth at bedtime.   Marland Kitchen oxyCODONE (OXY IR/ROXICODONE) 5 MG immediate release tablet Take one tablet by mouth every 6 hours as needed for breakthrough pain  . OxyCODONE (OXYCONTIN) 10  mg T12A 12 hr tablet Take one tablet by mouth every 12 hours for pain. Do not crush  . polyethylene glycol (MIRALAX / GLYCOLAX) packet Take 17 g by mouth daily.  . sennosides-docusate sodium (SENOKOT-S) 8.6-50 MG tablet Take 2 tablets by mouth daily.  Marland Kitchen senna (SENOKOT) 8.6 MG TABS tablet Take 1 tablet (8.6 mg total) by mouth 2 (two) times daily.  . [DISCONTINUED] enoxaparin (LOVENOX) 40 MG/0.4ML injection Inject 0.4 mLs (40 mg total) into the skin daily.     SIGNIFICANT DIAGNOSTIC EXAMS   05-09-14: chest x-ray: 1. No acute abnormality. 2. Mildly progressive cardiomegaly. 3. Progressive chronic interstitial lung disease.  05-09-14: right femur x-ray; Right femur fracture   05-09-14: right hip x-ray: Femur fracture  05-09-14: ct  of head and cervical spine: No acute abnormality involving the cervical spine. No acute intracranial abnormality.      LABS REVIEWED:  05-11-14: wbc 7.1; hgb 11.9; hct 31.8; mcv 87.9; plt 177; glucose 119; bun 16; creat 0.88; k+3.9; na++134 05-12-14: wbc 7.5; hgb 7.8; hct 22.9; mcv 91.2; plt 127; glucose 105; bun 16; creat 0.65; k+3.9; na++135; vit d 56 05-13-14: wbc 6.9; hgb 7.7; hct 22.4; mcv 91.1; plt 155; glucose 99; bun 12; creat 0.75; k+3.9;  na++ 137; BNP 1882 05-15-14: wbc 7.9; hgb 7.8; hct 24.3; mcv 95.3; plt 281; urine culture: e-coli: cipro     Review of Systems  Constitutional: Negative for malaise/fatigue.  HENT: Positive for congestion. Negative for sore throat.   Respiratory: Positive for cough. Negative for sputum production and shortness of breath.   Cardiovascular: Negative for chest pain and palpitations.  Musculoskeletal: Negative for myalgias and joint pain.  Skin: Negative.      Physical Exam  Constitutional: She is oriented to person, [name], and time. No distress.  HENT:  Mouth/Throat: No oropharyngeal exudate.  Neck: Neck supple. No JVD present.  Cardiovascular: Normal rate, regular rhythm and intact distal pulses.   Respiratory: Effort normal and breath sounds normal. No respiratory distress.  GI: Soft. Bowel sounds are normal. She exhibits no distension. There is no tenderness.  Lymphadenopathy:    She has no cervical adenopathy.  Neurological: She is alert and oriented to person, [name], and time.  Skin: She is not diaphoretic.       ASSESSMENT/ PLAN:  1. COPD: will being mucinex twice daily routinely; will being claritin 10 mg nightly; and flonase nightly and will continue her other medications will monitor her status.    Ok Edwards NP [facility]  Contact [phone] Monday through Friday 8am- 5pm  After hours call [phone]

## 2014-06-11 ENCOUNTER — Ambulatory Visit: Payer: Medicare Other | Admitting: Cardiology

## 2014-06-11 ENCOUNTER — Encounter: Payer: Self-pay | Admitting: Adult Health

## 2014-06-11 DIAGNOSIS — J45909 Unspecified asthma, uncomplicated: Secondary | ICD-10-CM | POA: Diagnosis not present

## 2014-06-11 DIAGNOSIS — J439 Emphysema, unspecified: Secondary | ICD-10-CM | POA: Diagnosis not present

## 2014-06-11 DIAGNOSIS — I10 Essential (primary) hypertension: Secondary | ICD-10-CM | POA: Diagnosis not present

## 2014-06-11 DIAGNOSIS — S72141D Displaced intertrochanteric fracture of right femur, subsequent encounter for closed fracture with routine healing: Secondary | ICD-10-CM | POA: Diagnosis not present

## 2014-06-11 DIAGNOSIS — I5032 Chronic diastolic (congestive) heart failure: Secondary | ICD-10-CM | POA: Diagnosis not present

## 2014-06-11 DIAGNOSIS — M6281 Muscle weakness (generalized): Secondary | ICD-10-CM | POA: Diagnosis not present

## 2014-06-11 NOTE — Progress Notes (Signed)
Patient ID: Brianna [name], female   DOB: 10/30/1929, 80 y.o.   MRN: [id number]  [facility]     Allergies  Allergen Reactions  . Atorvastatin     Fatigue   . Claritin [Loratadine] Other (See Comments)    fatigue  . Crestor [Rosuvastatin]     Myalgias. Pt is tolerating Lipitor 20mg  QOD well.       Chief Complaint  Patient presents with  . Discharge Note    HPI:  She is being discharged to home with home health for pt/ot/aid. She will need a front wheel walker. She will need her prescriptions written and will need a follow up with her pcp. She had been hospitalized for a right femur fracture and is ready for discharge to home.    Past Medical History  Diagnosis Date  . Emphysema of lung   . Cancer     breast  . Depression   . Hypertension   . Hyperlipidemia   . Osteopenia   . Nonischemic cardiomyopathy   . Automatic implantable cardiac defibrillator in situ     Past Surgical History  Procedure Laterality Date  . Breast surgery      mastectomy  . Appendectomy  1943  . Tonsillectomy  1940  . Cardiac defibrillator placement  10/13/2004    Medtronic Maximo VR, model [id number], serial [id number]  . Pacemaker insertion  06/17/2010    Medtronic [place] VR, model #D334VRG, serial [id number]  . Cardiac catheterization  12/18/2002    Normal coronary arteries and normal LV function  . Femur im nail Right 05/10/2014    Procedure: INTRAMEDULLARY (IM) NAIL FEMORAL;  Surgeon: Marianna Payment, MD;  Location: [facility];  Service: Orthopedics;  Laterality: Right;    VITAL SIGNS BP 132/70 mmHg  Pulse 69  Ht 5\' 1"  (1.549 m)  Wt 141 lb (63.957 kg)  BMI 26.66 kg/m2   Outpatient Encounter Prescriptions as of 06/05/2014  Medication Sig  . ADVAIR DISKUS 100-50 MCG/DOSE AEPB INHALE 1 PUFF INTO THE LUNGS 2 (TWO) TIMES DAILY.  Marland Kitchen albuterol (PROAIR HFA) 108 (90 BASE) MCG/ACT inhaler Inhale 2 puffs into the lungs every 6 (six) hours as needed for wheezing or shortness of breath.    . Calcium Carbonate-Vitamin D (CALTRATE 600+D) 600-400 MG-UNIT per tablet Take 1 tablet by mouth daily.   . carvedilol (COREG) 3.125 MG tablet Take 1 tablet (3.125 mg total) by mouth 2 (two) times daily with a meal.  . feeding supplement, ENSURE COMPLETE, (ENSURE COMPLETE) LIQD Take 237 mLs by mouth 3 (three) times daily between meals.  . ferrous sulfate 325 (65 FE) MG tablet Take 325 mg by mouth 2 (two) times daily with a meal.  . FLUoxetine (PROZAC) 10 MG capsule Take 10 mg by mouth at bedtime.  . Fluticasone-Salmeterol (ADVAIR) 100-50 MCG/DOSE AEPB Inhale 1 puff into the lungs daily.  . furosemide (LASIX) 20 MG tablet Take 1 tablet (20 mg total) by mouth daily.  . Ginkgo Biloba 40 MG TABS Take 1 tablet by mouth every evening.   . methocarbamol (ROBAXIN) 500 MG tablet Take 1 tablet (500 mg total) by mouth 3 (three) times daily.  . Multiple Vitamin (MULTI-VITAMIN DAILY PO) Take 1 tablet by mouth at bedtime.   Marland Kitchen oxyCODONE (OXY IR/ROXICODONE) 5 MG immediate release tablet Take one tablet by mouth every 6 hours as needed for breakthrough pain  . OxyCODONE (OXYCONTIN) 10 mg T12A 12 hr tablet Take one tablet by mouth every 12 hours for pain. Do not  crush  . polyethylene glycol (MIRALAX / GLYCOLAX) packet Take 17 g by mouth daily.  Marland Kitchen senna (SENOKOT) 8.6 MG TABS tablet Take 1 tablet (8.6 mg total) by mouth 2 (two) times daily.  . sennosides-docusate sodium (SENOKOT-S) 8.6-50 MG tablet Take 2 tablets by mouth daily.     SIGNIFICANT DIAGNOSTIC EXAMS  05-09-14: chest x-ray: 1. No acute abnormality. 2. Mildly progressive cardiomegaly. 3. Progressive chronic interstitial lung disease.  05-09-14: right femur x-ray; Right femur fracture   05-09-14: right hip x-ray: Femur fracture  05-09-14: ct of head and cervical spine: No acute abnormality involving the cervical spine. No acute intracranial abnormality.      LABS REVIEWED:  05-11-14: wbc 7.1; hgb 11.9; hct 31.8; mcv 87.9; plt 177; glucose  119; bun 16; creat 0.88; k+3.9; na++134 05-12-14: wbc 7.5; hgb 7.8; hct 22.9; mcv 91.2; plt 127; glucose 105; bun 16; creat 0.65; k+3.9; na++135; vit d 56 05-13-14: wbc 6.9; hgb 7.7; hct 22.4; mcv 91.1; plt 155; glucose 99; bun 12; creat 0.75; k+3.9;  na++ 137; BNP 1882 05-15-14: wbc 7.9; hgb 7.8; hct 24.3; mcv 95.3; plt 281; urine culture: e-coli: cipro      Review of Systems  Constitutional: Negative for malaise/fatigue.  Respiratory: Negative for cough and shortness of breath.   Cardiovascular: Negative for chest pain, palpitations and leg swelling.  Gastrointestinal: Negative for heartburn and constipation.  Musculoskeletal: Negative for myalgias and joint pain.  Skin: Negative.   Psychiatric/Behavioral: Negative for depression.     Physical Exam  Constitutional: She is oriented to person, place, and time. She appears well-developed and well-nourished. No distress.  thin  Neck: Neck supple. No JVD present.  Cardiovascular: Normal rate, regular rhythm and intact distal pulses.   Respiratory: Effort normal and breath sounds normal. No respiratory distress. She has no wheezes.  GI: Soft. Bowel sounds are normal. She exhibits no distension.  Musculoskeletal: She exhibits no edema.  Is able to move all extremities   Neurological: She is alert and oriented to person, place, and time.  Skin: Skin is warm and dry. She is not diaphoretic.       ASSESSMENT/ PLAN:  Will discharge her to home with home health for pt/ot to improve gait, strength, independence with adl's. Home health for nursing and aid for medication management and adl care. She will need a front wheel walker in order for her to maintain her current level of independence with her adl's. Her prescriptions have been written for a 30 day supply of her medications with oxycontin 10 mg #30 tabs and #30 oxycodone 5 mg tabs. She has a follow up with her pcp: Dr. Erin Hearing 08-17-13 at 10:45 am    Time spent with patient 45  minutes.    Ok Edwards NP [facility]  Contact [phone] Monday through Friday 8am- 5pm  After hours call [phone]

## 2014-06-14 DIAGNOSIS — S72141D Displaced intertrochanteric fracture of right femur, subsequent encounter for closed fracture with routine healing: Secondary | ICD-10-CM | POA: Diagnosis not present

## 2014-06-14 DIAGNOSIS — M6281 Muscle weakness (generalized): Secondary | ICD-10-CM | POA: Diagnosis not present

## 2014-06-14 DIAGNOSIS — J439 Emphysema, unspecified: Secondary | ICD-10-CM | POA: Diagnosis not present

## 2014-06-14 DIAGNOSIS — I10 Essential (primary) hypertension: Secondary | ICD-10-CM | POA: Diagnosis not present

## 2014-06-14 DIAGNOSIS — J45909 Unspecified asthma, uncomplicated: Secondary | ICD-10-CM | POA: Diagnosis not present

## 2014-06-14 DIAGNOSIS — I5032 Chronic diastolic (congestive) heart failure: Secondary | ICD-10-CM | POA: Diagnosis not present

## 2014-06-15 DIAGNOSIS — J439 Emphysema, unspecified: Secondary | ICD-10-CM | POA: Diagnosis not present

## 2014-06-15 DIAGNOSIS — S72141D Displaced intertrochanteric fracture of right femur, subsequent encounter for closed fracture with routine healing: Secondary | ICD-10-CM | POA: Diagnosis not present

## 2014-06-15 DIAGNOSIS — I5032 Chronic diastolic (congestive) heart failure: Secondary | ICD-10-CM | POA: Diagnosis not present

## 2014-06-15 DIAGNOSIS — I10 Essential (primary) hypertension: Secondary | ICD-10-CM | POA: Diagnosis not present

## 2014-06-15 DIAGNOSIS — J45909 Unspecified asthma, uncomplicated: Secondary | ICD-10-CM | POA: Diagnosis not present

## 2014-06-15 DIAGNOSIS — M6281 Muscle weakness (generalized): Secondary | ICD-10-CM | POA: Diagnosis not present

## 2014-06-16 DIAGNOSIS — I10 Essential (primary) hypertension: Secondary | ICD-10-CM | POA: Diagnosis not present

## 2014-06-16 DIAGNOSIS — I5032 Chronic diastolic (congestive) heart failure: Secondary | ICD-10-CM | POA: Diagnosis not present

## 2014-06-16 DIAGNOSIS — S72141D Displaced intertrochanteric fracture of right femur, subsequent encounter for closed fracture with routine healing: Secondary | ICD-10-CM | POA: Diagnosis not present

## 2014-06-16 DIAGNOSIS — J45909 Unspecified asthma, uncomplicated: Secondary | ICD-10-CM | POA: Diagnosis not present

## 2014-06-16 DIAGNOSIS — M6281 Muscle weakness (generalized): Secondary | ICD-10-CM | POA: Diagnosis not present

## 2014-06-16 DIAGNOSIS — J439 Emphysema, unspecified: Secondary | ICD-10-CM | POA: Diagnosis not present

## 2014-06-17 ENCOUNTER — Encounter: Payer: Self-pay | Admitting: Family Medicine

## 2014-06-17 ENCOUNTER — Ambulatory Visit (INDEPENDENT_AMBULATORY_CARE_PROVIDER_SITE_OTHER): Payer: Medicare Other | Admitting: Family Medicine

## 2014-06-17 VITALS — BP 155/78 | HR 79 | Temp 98.0°F | Ht 61.0 in | Wt 119.5 lb

## 2014-06-17 DIAGNOSIS — D62 Acute posthemorrhagic anemia: Secondary | ICD-10-CM | POA: Diagnosis not present

## 2014-06-17 DIAGNOSIS — I10 Essential (primary) hypertension: Secondary | ICD-10-CM

## 2014-06-17 LAB — CBC
HCT: 37.2 % (ref 36.0–46.0)
Hemoglobin: 12.2 g/dL (ref 12.0–15.0)
MCH: 29.5 pg (ref 26.0–34.0)
MCHC: 32.8 g/dL (ref 30.0–36.0)
MCV: 89.9 fL (ref 78.0–100.0)
MPV: 10 fL (ref 9.4–12.4)
PLATELETS: 211 10*3/uL (ref 150–400)
RBC: 4.14 MIL/uL (ref 3.87–5.11)
RDW: 14.2 % (ref 11.5–15.5)
WBC: 8.8 10*3/uL (ref 4.0–10.5)

## 2014-06-17 NOTE — Assessment & Plan Note (Signed)
Check follow up cbc.  She does not think she is on iron. If hgb still low may need

## 2014-06-17 NOTE — Assessment & Plan Note (Signed)
Seems stable 

## 2014-06-17 NOTE — Patient Instructions (Signed)
Good to see you today!  Thanks for coming in.  I will call you if your lab tests are not normal.  Otherwise Dr Kennon Rounds will discuss them at your next visit.  Bring all your medications bottles next visit  Congrats on stopping smoking - Make sure its forever  Call if any shortness of breath or lightheadness or chest pain

## 2014-06-17 NOTE — Assessment & Plan Note (Signed)
Controlled today but she does not know her medications and did not bring them. Asked her to follow up with PCP and bring all medications

## 2014-06-17 NOTE — Progress Notes (Signed)
   Subjective:    Patient ID: Brianna [name], female    DOB: Nov 23, 1929, 80 y.o.   MRN: [id number]  HPI  Hospital and rehab follow up.  She feels she is doing well.  Did not bring any of her medications and the undated list she has does not match current medication list  Hip fracture - using a walker wihtout much pain.  Continues outpatient Physical Therapy.  No falls or weakness  Anemia - thougtht to be post op.  Denies lightheadness or edema.  COPD - has her usual shortness of breath that is somewhat exacerbated by having to work harder to move around.  Using advair regularly and albuterol rarely.  No chest pain  Smoking - none for 10 days!  Chief Complaint noted Review of Symptoms - see HPI PMH - Smoking status noted.   Vital Signs reviewed     Review of Systems     Objective:   Physical Exam  Alert no acute distress Using a walker to move around in office Able to stand without help or symptoms Heart - Regular rate and rhythm.  No murmurs, gallops or rubs.    Lung - breath sounds dimished and bases and mild difuse expiratory wheeze Extremities:  No cyanosis, edema, or deformity noted with good range of motion of all major joints.        Assessment & Plan:

## 2014-06-19 DIAGNOSIS — J439 Emphysema, unspecified: Secondary | ICD-10-CM | POA: Diagnosis not present

## 2014-06-19 DIAGNOSIS — I5032 Chronic diastolic (congestive) heart failure: Secondary | ICD-10-CM | POA: Diagnosis not present

## 2014-06-19 DIAGNOSIS — M6281 Muscle weakness (generalized): Secondary | ICD-10-CM | POA: Diagnosis not present

## 2014-06-19 DIAGNOSIS — I10 Essential (primary) hypertension: Secondary | ICD-10-CM | POA: Diagnosis not present

## 2014-06-19 DIAGNOSIS — J45909 Unspecified asthma, uncomplicated: Secondary | ICD-10-CM | POA: Diagnosis not present

## 2014-06-19 DIAGNOSIS — S72141D Displaced intertrochanteric fracture of right femur, subsequent encounter for closed fracture with routine healing: Secondary | ICD-10-CM | POA: Diagnosis not present

## 2014-06-22 DIAGNOSIS — S72141D Displaced intertrochanteric fracture of right femur, subsequent encounter for closed fracture with routine healing: Secondary | ICD-10-CM | POA: Diagnosis not present

## 2014-06-22 DIAGNOSIS — I5032 Chronic diastolic (congestive) heart failure: Secondary | ICD-10-CM | POA: Diagnosis not present

## 2014-06-22 DIAGNOSIS — J439 Emphysema, unspecified: Secondary | ICD-10-CM | POA: Diagnosis not present

## 2014-06-22 DIAGNOSIS — J45909 Unspecified asthma, uncomplicated: Secondary | ICD-10-CM | POA: Diagnosis not present

## 2014-06-22 DIAGNOSIS — I10 Essential (primary) hypertension: Secondary | ICD-10-CM | POA: Diagnosis not present

## 2014-06-22 DIAGNOSIS — M6281 Muscle weakness (generalized): Secondary | ICD-10-CM | POA: Diagnosis not present

## 2014-06-23 ENCOUNTER — Encounter: Payer: Self-pay | Admitting: Family Medicine

## 2014-06-23 ENCOUNTER — Ambulatory Visit (INDEPENDENT_AMBULATORY_CARE_PROVIDER_SITE_OTHER): Payer: Medicare Other | Admitting: Family Medicine

## 2014-06-23 VITALS — BP 138/72 | HR 97 | Temp 97.8°F | Resp 22 | Wt 117.0 lb

## 2014-06-23 DIAGNOSIS — I10 Essential (primary) hypertension: Secondary | ICD-10-CM | POA: Diagnosis not present

## 2014-06-23 DIAGNOSIS — S72143A Displaced intertrochanteric fracture of unspecified femur, initial encounter for closed fracture: Secondary | ICD-10-CM

## 2014-06-23 DIAGNOSIS — S72141D Displaced intertrochanteric fracture of right femur, subsequent encounter for closed fracture with routine healing: Secondary | ICD-10-CM | POA: Diagnosis not present

## 2014-06-23 DIAGNOSIS — M6281 Muscle weakness (generalized): Secondary | ICD-10-CM | POA: Diagnosis not present

## 2014-06-23 DIAGNOSIS — J45909 Unspecified asthma, uncomplicated: Secondary | ICD-10-CM | POA: Diagnosis not present

## 2014-06-23 DIAGNOSIS — I5032 Chronic diastolic (congestive) heart failure: Secondary | ICD-10-CM | POA: Diagnosis not present

## 2014-06-23 DIAGNOSIS — J439 Emphysema, unspecified: Secondary | ICD-10-CM | POA: Diagnosis not present

## 2014-06-23 NOTE — Assessment & Plan Note (Signed)
No issues at home--BP is ok

## 2014-06-23 NOTE — Assessment & Plan Note (Signed)
Appears to be recovering well.

## 2014-06-23 NOTE — Patient Instructions (Signed)
Hip Fracture °A hip fracture is a fracture of the upper part of your thigh bone (femur).  °CAUSES °A hip fracture is caused by a direct blow to the side of your hip. This is usually the result of a fall but can occur in other circumstances, such as an automobile accident. °RISK FACTORS °There is an increased risk of hip fractures in people with: °· An unsteady walking pattern (gait) and those with conditions that contribute to poor balance, such as Parkinson's disease or dementia. °· Osteopenia and osteoporosis. °· Cancer that spreads to the leg bones. °· Certain metabolic diseases. °SYMPTOMS  °Symptoms of hip fracture include: °· Pain over the injured hip. °· Inability to put weight on the leg in which the fracture occurred (although, some patients are able to walk after a hip fracture). °· Toes and foot of the affected leg point outward when you lie down. °DIAGNOSIS °A physical exam can determine if a hip fracture is likely to have occurred. X-ray exams are needed to confirm the fracture and to look for other injuries. The X-ray exam can help to determine the type of hip fracture. Rarely, the fracture is not visible on an X-ray image and a CT scan or MRI will have to be done. °TREATMENT  °The treatment for a fracture is usually surgery. This means using a screw, nail, or rod to hold the bones in place.  °HOME CARE INSTRUCTIONS °Take all medicines as directed by your health care provider. °SEEK MEDICAL CARE IF: °Pain continues, even after taking pain medicine. °MAKE SURE YOU: °· Understand these instructions.   °· Will watch your condition. °· Will get help right away if you are not doing well or get worse. °Document Released: 07/10/2005 Document Revised: 07/15/2013 Document Reviewed: 02/19/2013 °[employer]® Patient Information ©2015 [employer]. This information is not intended to replace advice given to you by your health care provider. Make sure you discuss any questions you have with your health care  provider. ° °

## 2014-06-23 NOTE — Progress Notes (Signed)
    Subjective:    Patient ID: Brianna [name] is a 80 y.o. female presenting with Follow-up  on 06/23/2014  HPI: Doing well.  Sustained a right hip fracture and repair.  Now home with [facility] and PT and OT.  Doing well. Would like to no longer need her walker.  Review of Systems  Constitutional: Negative for fever and chills.  HENT: Negative for congestion.   Respiratory: Negative for shortness of breath.   Cardiovascular: Negative for chest pain.  Musculoskeletal: Positive for arthralgias.  Skin: Negative for rash.  Neurological: Negative for dizziness, syncope, light-headedness and headaches.  All other systems reviewed and are negative.     Objective:    BP 138/72 mmHg  Pulse 97  Temp(Src) 97.8 F (36.6 C) (Oral)  Resp 22  Wt 117 lb (53.071 kg)  SpO2 95% Physical Exam  Constitutional: She is oriented to person, place, and time. She appears well-developed and well-nourished. No distress.  HENT:  Head: Normocephalic and atraumatic.  Eyes: No scleral icterus.  Neck: Neck supple.  Cardiovascular: Normal rate.   Pulmonary/Chest: Effort normal.  Abdominal: Soft.  Neurological: She is alert and oriented to person, place, and time.  Skin: Skin is warm and dry.  Psychiatric: She has a normal mood and affect.        Assessment & Plan:   Problem List Items Addressed This Visit      Unprioritized   HYPERTENSION, BENIGN SYSTEMIC    No issues at home--BP is ok    Hip fracture, intertrochanteric - Primary    Appears to be recovering well.       Return in about 3 months (around 09/22/2014).

## 2014-06-24 DIAGNOSIS — J45909 Unspecified asthma, uncomplicated: Secondary | ICD-10-CM | POA: Diagnosis not present

## 2014-06-24 DIAGNOSIS — M6281 Muscle weakness (generalized): Secondary | ICD-10-CM | POA: Diagnosis not present

## 2014-06-24 DIAGNOSIS — J439 Emphysema, unspecified: Secondary | ICD-10-CM | POA: Diagnosis not present

## 2014-06-24 DIAGNOSIS — S72141D Displaced intertrochanteric fracture of right femur, subsequent encounter for closed fracture with routine healing: Secondary | ICD-10-CM | POA: Diagnosis not present

## 2014-06-24 DIAGNOSIS — I10 Essential (primary) hypertension: Secondary | ICD-10-CM | POA: Diagnosis not present

## 2014-06-24 DIAGNOSIS — I5032 Chronic diastolic (congestive) heart failure: Secondary | ICD-10-CM | POA: Diagnosis not present

## 2014-06-25 ENCOUNTER — Telehealth: Payer: Self-pay | Admitting: Family Medicine

## 2014-06-25 DIAGNOSIS — J439 Emphysema, unspecified: Secondary | ICD-10-CM | POA: Diagnosis not present

## 2014-06-25 DIAGNOSIS — S72141D Displaced intertrochanteric fracture of right femur, subsequent encounter for closed fracture with routine healing: Secondary | ICD-10-CM | POA: Diagnosis not present

## 2014-06-25 DIAGNOSIS — I5032 Chronic diastolic (congestive) heart failure: Secondary | ICD-10-CM | POA: Diagnosis not present

## 2014-06-25 DIAGNOSIS — J45909 Unspecified asthma, uncomplicated: Secondary | ICD-10-CM | POA: Diagnosis not present

## 2014-06-25 DIAGNOSIS — I10 Essential (primary) hypertension: Secondary | ICD-10-CM | POA: Diagnosis not present

## 2014-06-25 DIAGNOSIS — M6281 Muscle weakness (generalized): Secondary | ICD-10-CM | POA: Diagnosis not present

## 2014-06-25 NOTE — Telephone Encounter (Signed)
Calling to let us know they began start of care services on 06/10/14, pt declined nursing services. Pt is still getting PT and OT, pt doing well.

## 2014-06-29 DIAGNOSIS — I10 Essential (primary) hypertension: Secondary | ICD-10-CM | POA: Diagnosis not present

## 2014-06-29 DIAGNOSIS — J45909 Unspecified asthma, uncomplicated: Secondary | ICD-10-CM | POA: Diagnosis not present

## 2014-06-29 DIAGNOSIS — M6281 Muscle weakness (generalized): Secondary | ICD-10-CM | POA: Diagnosis not present

## 2014-06-29 DIAGNOSIS — I5032 Chronic diastolic (congestive) heart failure: Secondary | ICD-10-CM | POA: Diagnosis not present

## 2014-06-29 DIAGNOSIS — S72141D Displaced intertrochanteric fracture of right femur, subsequent encounter for closed fracture with routine healing: Secondary | ICD-10-CM | POA: Diagnosis not present

## 2014-06-29 DIAGNOSIS — J439 Emphysema, unspecified: Secondary | ICD-10-CM | POA: Diagnosis not present

## 2014-06-29 NOTE — Telephone Encounter (Signed)
Nancy from [facility] called and would like a verbal to extend pt for 1 additional occupational therapy visit as she is still recovering.  She can be reached at [phone]. Thanks,.Ozella Almond

## 2014-06-30 NOTE — Telephone Encounter (Signed)
OK to extend therapy.--Called and left message for OT, Izora Gala.

## 2014-07-01 DIAGNOSIS — I5032 Chronic diastolic (congestive) heart failure: Secondary | ICD-10-CM | POA: Diagnosis not present

## 2014-07-01 DIAGNOSIS — J45909 Unspecified asthma, uncomplicated: Secondary | ICD-10-CM | POA: Diagnosis not present

## 2014-07-01 DIAGNOSIS — S72141D Displaced intertrochanteric fracture of right femur, subsequent encounter for closed fracture with routine healing: Secondary | ICD-10-CM | POA: Diagnosis not present

## 2014-07-01 DIAGNOSIS — J439 Emphysema, unspecified: Secondary | ICD-10-CM | POA: Diagnosis not present

## 2014-07-01 DIAGNOSIS — I10 Essential (primary) hypertension: Secondary | ICD-10-CM | POA: Diagnosis not present

## 2014-07-01 DIAGNOSIS — M6281 Muscle weakness (generalized): Secondary | ICD-10-CM | POA: Diagnosis not present

## 2014-07-02 DIAGNOSIS — J439 Emphysema, unspecified: Secondary | ICD-10-CM | POA: Diagnosis not present

## 2014-07-02 DIAGNOSIS — S72141D Displaced intertrochanteric fracture of right femur, subsequent encounter for closed fracture with routine healing: Secondary | ICD-10-CM | POA: Diagnosis not present

## 2014-07-02 DIAGNOSIS — I5032 Chronic diastolic (congestive) heart failure: Secondary | ICD-10-CM | POA: Diagnosis not present

## 2014-07-02 DIAGNOSIS — I10 Essential (primary) hypertension: Secondary | ICD-10-CM | POA: Diagnosis not present

## 2014-07-02 DIAGNOSIS — J45909 Unspecified asthma, uncomplicated: Secondary | ICD-10-CM | POA: Diagnosis not present

## 2014-07-02 DIAGNOSIS — M6281 Muscle weakness (generalized): Secondary | ICD-10-CM | POA: Diagnosis not present

## 2014-07-05 ENCOUNTER — Other Ambulatory Visit: Payer: Self-pay | Admitting: Adult Health

## 2014-07-06 DIAGNOSIS — I10 Essential (primary) hypertension: Secondary | ICD-10-CM | POA: Diagnosis not present

## 2014-07-06 DIAGNOSIS — I5032 Chronic diastolic (congestive) heart failure: Secondary | ICD-10-CM | POA: Diagnosis not present

## 2014-07-06 DIAGNOSIS — J45909 Unspecified asthma, uncomplicated: Secondary | ICD-10-CM | POA: Diagnosis not present

## 2014-07-06 DIAGNOSIS — M6281 Muscle weakness (generalized): Secondary | ICD-10-CM | POA: Diagnosis not present

## 2014-07-06 DIAGNOSIS — J439 Emphysema, unspecified: Secondary | ICD-10-CM | POA: Diagnosis not present

## 2014-07-06 DIAGNOSIS — S72141D Displaced intertrochanteric fracture of right femur, subsequent encounter for closed fracture with routine healing: Secondary | ICD-10-CM | POA: Diagnosis not present

## 2014-07-08 DIAGNOSIS — S72141D Displaced intertrochanteric fracture of right femur, subsequent encounter for closed fracture with routine healing: Secondary | ICD-10-CM | POA: Diagnosis not present

## 2014-07-08 DIAGNOSIS — J45909 Unspecified asthma, uncomplicated: Secondary | ICD-10-CM | POA: Diagnosis not present

## 2014-07-08 DIAGNOSIS — I5032 Chronic diastolic (congestive) heart failure: Secondary | ICD-10-CM | POA: Diagnosis not present

## 2014-07-08 DIAGNOSIS — J439 Emphysema, unspecified: Secondary | ICD-10-CM | POA: Diagnosis not present

## 2014-07-08 DIAGNOSIS — I10 Essential (primary) hypertension: Secondary | ICD-10-CM | POA: Diagnosis not present

## 2014-07-08 DIAGNOSIS — M6281 Muscle weakness (generalized): Secondary | ICD-10-CM | POA: Diagnosis not present

## 2014-07-09 DIAGNOSIS — S72141A Displaced intertrochanteric fracture of right femur, initial encounter for closed fracture: Secondary | ICD-10-CM | POA: Diagnosis not present

## 2014-07-11 DIAGNOSIS — M6281 Muscle weakness (generalized): Secondary | ICD-10-CM | POA: Diagnosis not present

## 2014-07-11 DIAGNOSIS — S72141D Displaced intertrochanteric fracture of right femur, subsequent encounter for closed fracture with routine healing: Secondary | ICD-10-CM | POA: Diagnosis not present

## 2014-07-11 DIAGNOSIS — I10 Essential (primary) hypertension: Secondary | ICD-10-CM | POA: Diagnosis not present

## 2014-07-11 DIAGNOSIS — J45909 Unspecified asthma, uncomplicated: Secondary | ICD-10-CM | POA: Diagnosis not present

## 2014-07-11 DIAGNOSIS — I5032 Chronic diastolic (congestive) heart failure: Secondary | ICD-10-CM | POA: Diagnosis not present

## 2014-07-11 DIAGNOSIS — J439 Emphysema, unspecified: Secondary | ICD-10-CM | POA: Diagnosis not present

## 2014-07-13 DIAGNOSIS — I5032 Chronic diastolic (congestive) heart failure: Secondary | ICD-10-CM | POA: Diagnosis not present

## 2014-07-13 DIAGNOSIS — J45909 Unspecified asthma, uncomplicated: Secondary | ICD-10-CM | POA: Diagnosis not present

## 2014-07-13 DIAGNOSIS — S72141D Displaced intertrochanteric fracture of right femur, subsequent encounter for closed fracture with routine healing: Secondary | ICD-10-CM | POA: Diagnosis not present

## 2014-07-13 DIAGNOSIS — I10 Essential (primary) hypertension: Secondary | ICD-10-CM | POA: Diagnosis not present

## 2014-07-13 DIAGNOSIS — M6281 Muscle weakness (generalized): Secondary | ICD-10-CM | POA: Diagnosis not present

## 2014-07-13 DIAGNOSIS — J439 Emphysema, unspecified: Secondary | ICD-10-CM | POA: Diagnosis not present

## 2014-07-15 DIAGNOSIS — S72141D Displaced intertrochanteric fracture of right femur, subsequent encounter for closed fracture with routine healing: Secondary | ICD-10-CM | POA: Diagnosis not present

## 2014-07-15 DIAGNOSIS — M6281 Muscle weakness (generalized): Secondary | ICD-10-CM | POA: Diagnosis not present

## 2014-07-15 DIAGNOSIS — J45909 Unspecified asthma, uncomplicated: Secondary | ICD-10-CM | POA: Diagnosis not present

## 2014-07-15 DIAGNOSIS — J439 Emphysema, unspecified: Secondary | ICD-10-CM | POA: Diagnosis not present

## 2014-07-15 DIAGNOSIS — I5032 Chronic diastolic (congestive) heart failure: Secondary | ICD-10-CM | POA: Diagnosis not present

## 2014-07-15 DIAGNOSIS — I10 Essential (primary) hypertension: Secondary | ICD-10-CM | POA: Diagnosis not present

## 2014-07-17 ENCOUNTER — Other Ambulatory Visit: Payer: Self-pay | Admitting: Family Medicine

## 2014-07-20 ENCOUNTER — Other Ambulatory Visit: Payer: Self-pay | Admitting: Family Medicine

## 2014-07-20 DIAGNOSIS — J45909 Unspecified asthma, uncomplicated: Secondary | ICD-10-CM | POA: Diagnosis not present

## 2014-07-20 DIAGNOSIS — J439 Emphysema, unspecified: Secondary | ICD-10-CM | POA: Diagnosis not present

## 2014-07-20 DIAGNOSIS — S72141D Displaced intertrochanteric fracture of right femur, subsequent encounter for closed fracture with routine healing: Secondary | ICD-10-CM | POA: Diagnosis not present

## 2014-07-20 DIAGNOSIS — M6281 Muscle weakness (generalized): Secondary | ICD-10-CM | POA: Diagnosis not present

## 2014-07-20 DIAGNOSIS — I10 Essential (primary) hypertension: Secondary | ICD-10-CM | POA: Diagnosis not present

## 2014-07-20 DIAGNOSIS — I5032 Chronic diastolic (congestive) heart failure: Secondary | ICD-10-CM | POA: Diagnosis not present

## 2014-07-22 DIAGNOSIS — I5032 Chronic diastolic (congestive) heart failure: Secondary | ICD-10-CM | POA: Diagnosis not present

## 2014-07-22 DIAGNOSIS — J45909 Unspecified asthma, uncomplicated: Secondary | ICD-10-CM | POA: Diagnosis not present

## 2014-07-22 DIAGNOSIS — I10 Essential (primary) hypertension: Secondary | ICD-10-CM | POA: Diagnosis not present

## 2014-07-22 DIAGNOSIS — M6281 Muscle weakness (generalized): Secondary | ICD-10-CM | POA: Diagnosis not present

## 2014-07-22 DIAGNOSIS — J439 Emphysema, unspecified: Secondary | ICD-10-CM | POA: Diagnosis not present

## 2014-07-22 DIAGNOSIS — S72141D Displaced intertrochanteric fracture of right femur, subsequent encounter for closed fracture with routine healing: Secondary | ICD-10-CM | POA: Diagnosis not present

## 2014-07-27 DIAGNOSIS — I10 Essential (primary) hypertension: Secondary | ICD-10-CM | POA: Diagnosis not present

## 2014-07-27 DIAGNOSIS — J439 Emphysema, unspecified: Secondary | ICD-10-CM | POA: Diagnosis not present

## 2014-07-27 DIAGNOSIS — J45909 Unspecified asthma, uncomplicated: Secondary | ICD-10-CM | POA: Diagnosis not present

## 2014-07-27 DIAGNOSIS — S72141D Displaced intertrochanteric fracture of right femur, subsequent encounter for closed fracture with routine healing: Secondary | ICD-10-CM | POA: Diagnosis not present

## 2014-07-27 DIAGNOSIS — I5032 Chronic diastolic (congestive) heart failure: Secondary | ICD-10-CM | POA: Diagnosis not present

## 2014-07-27 DIAGNOSIS — M6281 Muscle weakness (generalized): Secondary | ICD-10-CM | POA: Diagnosis not present

## 2014-07-29 DIAGNOSIS — I10 Essential (primary) hypertension: Secondary | ICD-10-CM | POA: Diagnosis not present

## 2014-07-29 DIAGNOSIS — J45909 Unspecified asthma, uncomplicated: Secondary | ICD-10-CM | POA: Diagnosis not present

## 2014-07-29 DIAGNOSIS — M6281 Muscle weakness (generalized): Secondary | ICD-10-CM | POA: Diagnosis not present

## 2014-07-29 DIAGNOSIS — J439 Emphysema, unspecified: Secondary | ICD-10-CM | POA: Diagnosis not present

## 2014-07-29 DIAGNOSIS — S72141D Displaced intertrochanteric fracture of right femur, subsequent encounter for closed fracture with routine healing: Secondary | ICD-10-CM | POA: Diagnosis not present

## 2014-07-29 DIAGNOSIS — I5032 Chronic diastolic (congestive) heart failure: Secondary | ICD-10-CM | POA: Diagnosis not present

## 2014-08-03 ENCOUNTER — Other Ambulatory Visit: Payer: Self-pay | Admitting: Cardiovascular Disease

## 2014-08-03 DIAGNOSIS — S72141D Displaced intertrochanteric fracture of right femur, subsequent encounter for closed fracture with routine healing: Secondary | ICD-10-CM | POA: Diagnosis not present

## 2014-08-03 DIAGNOSIS — I10 Essential (primary) hypertension: Secondary | ICD-10-CM | POA: Diagnosis not present

## 2014-08-03 DIAGNOSIS — J439 Emphysema, unspecified: Secondary | ICD-10-CM | POA: Diagnosis not present

## 2014-08-03 DIAGNOSIS — M6281 Muscle weakness (generalized): Secondary | ICD-10-CM | POA: Diagnosis not present

## 2014-08-03 DIAGNOSIS — J45909 Unspecified asthma, uncomplicated: Secondary | ICD-10-CM | POA: Diagnosis not present

## 2014-08-03 DIAGNOSIS — I5032 Chronic diastolic (congestive) heart failure: Secondary | ICD-10-CM | POA: Diagnosis not present

## 2014-08-04 ENCOUNTER — Other Ambulatory Visit: Payer: Self-pay | Admitting: Family Medicine

## 2014-08-04 ENCOUNTER — Other Ambulatory Visit: Payer: Self-pay | Admitting: Adult Health

## 2014-08-05 NOTE — Telephone Encounter (Signed)
Called pharmacy (spoke to [place]) to cancel rx for Robaxin, rx was to be refused/denied. Patient needs to f/u with PCP for refills, Barnabas Lister verbalized understanding and deleted rx.

## 2014-08-06 DIAGNOSIS — M6281 Muscle weakness (generalized): Secondary | ICD-10-CM | POA: Diagnosis not present

## 2014-08-06 DIAGNOSIS — J45909 Unspecified asthma, uncomplicated: Secondary | ICD-10-CM | POA: Diagnosis not present

## 2014-08-06 DIAGNOSIS — J439 Emphysema, unspecified: Secondary | ICD-10-CM | POA: Diagnosis not present

## 2014-08-06 DIAGNOSIS — I5032 Chronic diastolic (congestive) heart failure: Secondary | ICD-10-CM | POA: Diagnosis not present

## 2014-08-06 DIAGNOSIS — S72141D Displaced intertrochanteric fracture of right femur, subsequent encounter for closed fracture with routine healing: Secondary | ICD-10-CM | POA: Diagnosis not present

## 2014-08-06 DIAGNOSIS — I10 Essential (primary) hypertension: Secondary | ICD-10-CM | POA: Diagnosis not present

## 2014-08-27 ENCOUNTER — Encounter: Payer: Self-pay | Admitting: *Deleted

## 2014-09-10 DIAGNOSIS — M4150 Other secondary scoliosis, site unspecified: Secondary | ICD-10-CM | POA: Diagnosis not present

## 2014-09-10 DIAGNOSIS — S72141D Displaced intertrochanteric fracture of right femur, subsequent encounter for closed fracture with routine healing: Secondary | ICD-10-CM | POA: Diagnosis not present

## 2014-10-18 ENCOUNTER — Other Ambulatory Visit: Payer: Self-pay | Admitting: Family Medicine

## 2014-10-19 ENCOUNTER — Ambulatory Visit (INDEPENDENT_AMBULATORY_CARE_PROVIDER_SITE_OTHER): Payer: Medicare Other | Admitting: Cardiovascular Disease

## 2014-10-19 ENCOUNTER — Encounter: Payer: Self-pay | Admitting: Cardiovascular Disease

## 2014-10-19 VITALS — BP 151/83 | HR 78 | Resp 16 | Ht 61.0 in | Wt 119.4 lb

## 2014-10-19 DIAGNOSIS — I472 Ventricular tachycardia: Secondary | ICD-10-CM | POA: Diagnosis not present

## 2014-10-19 DIAGNOSIS — Z95810 Presence of automatic (implantable) cardiac defibrillator: Secondary | ICD-10-CM | POA: Diagnosis not present

## 2014-10-19 DIAGNOSIS — I429 Cardiomyopathy, unspecified: Secondary | ICD-10-CM

## 2014-10-19 DIAGNOSIS — I10 Essential (primary) hypertension: Secondary | ICD-10-CM

## 2014-10-19 DIAGNOSIS — I4729 Other ventricular tachycardia: Secondary | ICD-10-CM

## 2014-10-19 DIAGNOSIS — I428 Other cardiomyopathies: Secondary | ICD-10-CM

## 2014-10-19 DIAGNOSIS — I5032 Chronic diastolic (congestive) heart failure: Secondary | ICD-10-CM

## 2014-10-19 NOTE — Progress Notes (Signed)
Patient ID: Brianna [name], female   DOB: March 27, 1930, 81 y.o.   MRN: [id number]      Cardiology Office Note   Date:  10/20/2014   ID:  Brianna [name], DOB 15-Dec-1929, MRN [id number]  PCP:  Donnamae Jude, MD  Cardiologist:   Sanda Klein, MD   Chief Complaint  Patient presents with  . Follow-up    complains of feeling tired all the time.  No chest pain, edema.  SOB with minimal activity.  Occas. lightheadedness.      History of Present Illness: Brianna [name] is a 81 y.o. female who presents for single chamber defibrillator follow-up. A former patient of Dr. Terance Ice, she now sees Dr. Quay Burow. 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 Her left ventricular ejection fraction was as low as 30% when she received her single chamber defibrillator in 2006, but her most recent LV ejection fraction was 45-50 percent by echo in June 2014. She has a history of a Medtronic Sprint fidelis lead fracture and now has a Chief Operating Officer. Her defibrillator is a Artist device implanted in 2011. She has not received either appropriate or inappropriate therapy from her defibrillator and she does not require ventricular pacing. She has problems with dyspnea that improves with bronchodilators and is probably primarily related to asthma. She complains of fatigue. She never has problems with lower extremity edema and does not need to adjust her loop diuretic dose. She had a fall in October 2015 and now has a rod in her right femur. She was relatively inactive even before her hip fracture, around 0.5 hours per day. Activity is now is around 0.3 hours per day.  Interrogation of her defibrillator shows 100% ventricular sensed rhythm without need for pacing. There has been no true atrial fibrillation but she has had brief episodes of atrial tachycardia and some brief nonsustained ventricular tachycardia. There  was no evidence of any arrhythmia around the time of her fall last October. Generator battery voltage is 3.08 V (ERI 2.63 V).  Past Medical History  Diagnosis Date  . Emphysema of lung   . Cancer     breast  . Depression   . Hypertension   . Hyperlipidemia   . Osteopenia   . Nonischemic cardiomyopathy   . Automatic implantable cardiac defibrillator in situ     Past Surgical History  Procedure Laterality Date  . Breast surgery      mastectomy  . Appendectomy  1943  . Tonsillectomy  1940  . Cardiac defibrillator placement  10/13/2004    Medtronic Maximo VR, model [id number], serial [id number]  . Pacemaker insertion  06/17/2010    Medtronic Coates VR, model #D334VRG, serial [id number]  . Cardiac catheterization  12/18/2002    Normal coronary arteries and normal LV function  . Femur im nail Right 05/10/2014    Procedure: INTRAMEDULLARY (IM) NAIL FEMORAL;  Surgeon: Marianna Payment, MD;  Location: [facility];  Service: Orthopedics;  Laterality: Right;     Current Outpatient Prescriptions  Medication Sig Dispense Refill  . ADVAIR DISKUS 100-50 MCG/DOSE AEPB INHALE 1 PUFF INTO THE LUNGS 2 (TWO) TIMES DAILY. 60 each 1  . albuterol (PROAIR HFA) 108 (90 BASE) MCG/ACT inhaler Inhale 2 puffs into the lungs every 6 (six) hours as needed for wheezing or shortness of breath.    . Calcium Carbonate-Vitamin D (CALTRATE 600+D) 600-400 MG-UNIT per tablet Take 1 tablet by mouth daily.     Marland Kitchen  carvedilol (COREG) 3.125 MG tablet Take 1 tablet (3.125 mg total) by mouth 2 (two) times daily with a meal. 30 tablet 3  . enalapril (VASOTEC) 2.5 MG tablet TAKE 2 TABLETS (5 MG TOTAL) BY MOUTH DAILY. 90 tablet 2  . feeding supplement, ENSURE COMPLETE, (ENSURE COMPLETE) LIQD Take 237 mLs by mouth 3 (three) times daily between meals. 6 Bottle 1  . ferrous sulfate 325 (65 FE) MG tablet Take 325 mg by mouth 2 (two) times daily with a meal.    . furosemide (LASIX) 20 MG tablet Take 1 tablet (20 mg total) by mouth  daily. 90 tablet 3  . Ginkgo Biloba 40 MG TABS Take 1 tablet by mouth every evening.     . methocarbamol (ROBAXIN) 500 MG tablet TAKE 1 TABLET BY MOUTH EVERY 6 HOURS 120 tablet 0  . Multiple Vitamin (MULTI-VITAMIN DAILY PO) Take 1 tablet by mouth at bedtime.      No current facility-administered medications for this visit.    Allergies:   Atorvastatin; Claritin; and Crestor    Social History:  The patient  reports that she has been smoking Cigarettes.  She has never used smokeless tobacco. She reports that she drinks about 0.6 oz of alcohol per week. She reports that she does not use illicit drugs.   Family History:  The patient's family history includes Cancer in her daughter; Heart disease in her brother, father, and mother; Hyperlipidemia in her sister.    ROS:  Please see the history of present illness.    Otherwise, review of systems positive for none.   All other systems are reviewed and negative.    PHYSICAL EXAM: VS:  BP 151/83 mmHg  Pulse 78  Resp 16  Ht 5\' 1"  (1.549 m)  Wt 119 lb 6.4 oz (54.159 kg)  BMI 22.57 kg/m2 , BMI Body mass index is 22.57 kg/(m^2).  General: Alert, oriented x3, no distress Head: no evidence of trauma, PERRL, EOMI, no exophtalmos or lid lag, no myxedema, no xanthelasma; normal ears, nose and oropharynx Neck: normal jugular venous pulsations and no hepatojugular reflux; brisk carotid pulses without delay and no carotid bruits Chest: clear to auscultation, no signs of consolidation by percussion or palpation, normal fremitus, symmetrical and full respiratory excursions Cardiovascular: normal position and quality of the apical impulse, regular rhythm, normal first and second heart sounds, no murmurs, rubs or gallops Abdomen: no tenderness or distention, no masses by palpation, no abnormal pulsatility or arterial bruits, normal bowel sounds, no hepatosplenomegaly Extremities: no clubbing, cyanosis or edema; 2+ radial, ulnar and brachial pulses  bilaterally; 2+ right femoral, posterior tibial and dorsalis pedis pulses; 2+ left femoral, posterior tibial and dorsalis pedis pulses; no subclavian or femoral bruits Neurological: grossly nonfocal Psych: euthymic mood, full affect   EKG:  EKG is ordered today. The ekg ordered today demonstrates normal sinus rhythm, left atrial enlargement, left bundle branch block with a QRS of 120 ms   Recent Labs: 01/19/2014: ALT 16 05/13/2014: BUN 12; Creatinine 0.75; Potassium 3.9; Pro B Natriuretic peptide (BNP) 1882.0*; Sodium 137 06/17/2014: Hemoglobin 12.2; Platelets 211    Lipid Panel    Component Value Date/Time   CHOL 255 01/27/2008   TRIG 92 01/27/2008   HDL 62 01/27/2008   CHOLHDL 62 01/27/2008   VLDL 18 01/27/2008   LDLCALC 152* 11/05/2007 2018   LDLDIRECT 147 01/27/2008      Wt Readings from Last 3 Encounters:  10/19/14 119 lb 6.4 oz (54.159 kg)  06/23/14 117 lb (  53.071 kg)  06/17/14 119 lb 8 oz (54.205 kg)       ASSESSMENT AND PLAN:  1.  Normally functioning single chamber defibrillator without need for ventricular pacing or therapies. No evidence of sustained atrial or ventricular arrhythmia. No changes were made to her programming. Will follow-up in 3 months at the [facility], in 6 months in the [place] office (previous attempts at remote monitoring have repeatedly been unsuccessful possibly because of her phone line arrangements)  2. Mild nonischemic cardiomyopathy without clear evidence of congestive heart failure. Follow-up with Dr. Quay Burow as scheduled; she is on very small doses of carvedilol and ACE inhibitor as which might be optimized. Her blood pressure today is actually high.   Current medicines are reviewed at length with the patient today.  The patient does not have concerns regarding medicines.  The following changes have been made:  no change  Labs/ tests ordered today include:  No orders of the defined types were placed in  this encounter.   Patient Instructions  Your physician recommends that you schedule a follow-up appointment in: 3 months [place], and in 6 months with DR.Payslie Mccaig AT [place].  Your physician recommends that you schedule a follow-up appointment with DR. BERRY FIRST AVAILABLE    Signed, Sanda Klein, MD  10/20/2014 4:56 PM    Sanda Klein, MD, [facility] [phone] office [phone] pager

## 2014-10-19 NOTE — Patient Instructions (Addendum)
Your physician recommends that you schedule a follow-up appointment in: 3 months [place], and in 6 months with DR.CROITORU AT [place].  Your physician recommends that you schedule a follow-up appointment with DR. BERRY FIRST AVAILABLE

## 2014-10-21 NOTE — Addendum Note (Signed)
Addended by: Diana Eves on: 10/21/2014 05:13 PM   Modules accepted: Orders

## 2014-10-22 LAB — MDC_IDC_ENUM_SESS_TYPE_INCLINIC
Brady Statistic RV Percent Paced: 0 %
HighPow Impedance: 285 Ohm
HighPow Impedance: 72 Ohm
Lead Channel Impedance Value: 399 Ohm
Lead Channel Sensing Intrinsic Amplitude: 8.5 mV
Lead Channel Sensing Intrinsic Amplitude: 8.5 mV
Lead Channel Setting Pacing Amplitude: 2 V
Lead Channel Setting Pacing Pulse Width: 0.4 ms
Lead Channel Setting Sensing Sensitivity: 0.45 mV
MDC IDC MSMT BATTERY VOLTAGE: 3.08 V
MDC IDC SESS DTM: 20160328125622
MDC IDC SET ZONE DETECTION INTERVAL: 300 ms
MDC IDC SET ZONE DETECTION INTERVAL: 360 ms
Zone Setting Detection Interval: 430 ms

## 2014-10-22 LAB — MDC_IDC_ENUM_SESS_TYPE_REMOTE
Battery Voltage: 3.08 V
Brady Statistic RV Percent Paced: 0 %
Date Time Interrogation Session: 20160328125622
HIGH POWER IMPEDANCE MEASURED VALUE: 72 Ohm
HighPow Impedance: 285 Ohm
Lead Channel Impedance Value: 399 Ohm
Lead Channel Sensing Intrinsic Amplitude: 8.5 mV
Lead Channel Setting Pacing Amplitude: 2 V
Lead Channel Setting Pacing Pulse Width: 0.4 ms
Lead Channel Setting Sensing Sensitivity: 0.45 mV
MDC IDC MSMT LEADCHNL RV SENSING INTR AMPL: 8.5 mV
MDC IDC SET ZONE DETECTION INTERVAL: 430 ms
Zone Setting Detection Interval: 300 ms
Zone Setting Detection Interval: 360 ms

## 2014-10-23 ENCOUNTER — Encounter: Payer: Self-pay | Admitting: Cardiovascular Disease

## 2014-11-27 ENCOUNTER — Other Ambulatory Visit: Payer: Self-pay | Admitting: Cardiovascular Disease

## 2014-11-27 NOTE — Telephone Encounter (Signed)
Rx(s) sent to pharmacy electronically.  

## 2014-12-22 ENCOUNTER — Encounter: Payer: Self-pay | Admitting: Cardiovascular Disease

## 2014-12-22 ENCOUNTER — Ambulatory Visit (INDEPENDENT_AMBULATORY_CARE_PROVIDER_SITE_OTHER): Payer: Medicare Other | Admitting: Cardiovascular Disease

## 2014-12-22 VITALS — BP 118/72 | HR 82 | Ht 61.0 in | Wt 122.9 lb

## 2014-12-22 DIAGNOSIS — E7800 Pure hypercholesterolemia, unspecified: Secondary | ICD-10-CM

## 2014-12-22 DIAGNOSIS — Z79899 Other long term (current) drug therapy: Secondary | ICD-10-CM

## 2014-12-22 DIAGNOSIS — I428 Other cardiomyopathies: Secondary | ICD-10-CM

## 2014-12-22 DIAGNOSIS — E785 Hyperlipidemia, unspecified: Secondary | ICD-10-CM

## 2014-12-22 DIAGNOSIS — I10 Essential (primary) hypertension: Secondary | ICD-10-CM | POA: Diagnosis not present

## 2014-12-22 DIAGNOSIS — I429 Cardiomyopathy, unspecified: Secondary | ICD-10-CM

## 2014-12-22 MED ORDER — CARVEDILOL 3.125 MG PO TABS: 3.1250 mg | ORAL_TABLET | Freq: Two times a day (BID) | ORAL | Status: DC

## 2014-12-22 NOTE — Assessment & Plan Note (Signed)
History of hyperlipidemia on atorvastatin 40 mg a day. We will check a lipid and liver profile

## 2014-12-22 NOTE — Progress Notes (Signed)
12/22/2014 ALOHA [name]   01/15/1930  [id number]  Primary Physician Brianna Jude, MD Primary Cardiologist: Brianna Harp MD Brianna [name]   HPI:  The patient is a very pleasant 81 year old thin appearing widowed Caucasian female mother of 2, grandmother to 2 grandchildren who has a history of nonischemic cardiomyopathy related to chemotherapy for breast cancer 10 years ago. She had an ICD placed for primary prevention by Dr. Elvis [name] in March of 2006 which has been followed by Brianna [name].Brianna [name] last saw her in March and her ICD was functioning appropriately. Her most recent 2D echo performed in our office on February 27, 2012 revealed an EF of 45-50% with mild global hypokinesia and septal wall motion abnormality consistent with a conduction abnormality. She does complain of dyspnea on exertion but denies chest pain. Since I saw her year ago she's or remain chronically stable.   Current Outpatient Prescriptions  Medication Sig Dispense Refill  . ADVAIR DISKUS 100-50 MCG/DOSE AEPB INHALE 1 PUFF INTO THE LUNGS 2 (TWO) TIMES DAILY. 60 each 1  . albuterol (PROAIR HFA) 108 (90 BASE) MCG/ACT inhaler Inhale 2 puffs into the lungs every 6 (six) hours as needed for wheezing or shortness of breath.    Marland Kitchen aspirin 81 MG tablet Take 81 mg by mouth daily.    Marland Kitchen atorvastatin (LIPITOR) 40 MG tablet Take 40 mg by mouth daily.    . Calcium Carbonate-Vitamin D (CALTRATE 600+D) 600-400 MG-UNIT per tablet Take 1 tablet by mouth daily.     . enalapril (VASOTEC) 2.5 MG tablet TAKE 2 TABLETS (5 MG TOTAL) BY MOUTH DAILY. 90 tablet 4  . feeding supplement, ENSURE COMPLETE, (ENSURE COMPLETE) LIQD Take 237 mLs by mouth 3 (three) times daily between meals. 6 Bottle 1  . ferrous sulfate 325 (65 FE) MG tablet Take 325 mg by mouth 2 (two) times daily with a meal.    . furosemide (LASIX) 20 MG tablet TAKE 1 TABLET (20 MG TOTAL) BY MOUTH DAILY. 90 tablet 2  . Multiple Vitamin (MULTI-VITAMIN DAILY  PO) Take 1 tablet by mouth at bedtime.     Marland Kitchen FLUoxetine (PROZAC) 10 MG capsule Take 10 mg by mouth daily.  2   No current facility-administered medications for this visit.    Allergies  Allergen Reactions  . Atorvastatin     Fatigue   . Claritin [Loratadine] Other (See Comments)    fatigue  . Crestor [Rosuvastatin]     Myalgias. Pt is tolerating Lipitor 20mg  QOD well.    History   Social History  . Marital Status: Widowed    Spouse Name: N/A  . Number of Children: 2  . Years of Education: 11   Occupational History  . Retired-Manager Writer.    Social History Main Topics  . Smoking status: Current Some Day Smoker    Types: Cigarettes  . Smokeless tobacco: Never Used     Comment: Pt reports smoking about 1-2 times a week when stressed.   . Alcohol Use: 0.6 oz/week    1 Glasses of wine per week  . Drug Use: No  . Sexual Activity: No   Other Topics Concern  . Not on file   Social History Narrative   Health Care POA:    Emergency Contact: friend, Brianna [name], [phone]   End of Life Plan:    Who lives with you: self, has medical alert button    Any pets: none   Diet: Patient has a varied diet  and controls portions well.   Exercise: Patient does not have any regular exercise routine.   Seatbelts: Patient reports wearing seatbelt when in vehicle.   Nancy Fetter Exposure/Protection: Patient reports wearing sun screening daily.   Hobbies: clothes alterations                 Review of Systems: General: negative for chills, fever, night sweats or weight changes.  Cardiovascular: negative for chest pain, dyspnea on exertion, edema, orthopnea, palpitations, paroxysmal nocturnal dyspnea or shortness of breath Dermatological: negative for rash Respiratory: negative for cough or wheezing Urologic: negative for hematuria Abdominal: negative for nausea, vomiting, diarrhea, bright red blood per rectum, melena, or hematemesis Neurologic: negative for visual changes, syncope,  or dizziness All other systems reviewed and are otherwise negative except as noted above.    Blood pressure 118/72, pulse 82, height 5\' 1"  (1.549 m), weight 122 lb 14.4 oz (55.747 kg).  General appearance: alert and no distress Neck: no adenopathy, no carotid bruit, no JVD, supple, symmetrical, trachea midline and thyroid not enlarged, symmetric, no tenderness/mass/nodules Lungs: clear to auscultation bilaterally Heart: regular rate and rhythm, S1, S2 normal, no murmur, click, rub or gallop Extremities: extremities normal, atraumatic, no cyanosis or edema  EKG not performed today  ASSESSMENT AND PLAN:   Nonischemic cardiomyopathy History of nonischemic cardio myopathy status post ICD implantation by Dr. Phillip [name] March 2006 currently followed by Brianna [name]. Her most recent ejection fraction measured by 2-D echocardiogram 01/16/13 was 45-50%. She denies chest pain or shortness of breath.   HYPERTENSION, BENIGN SYSTEMIC History of hypertension with blood pressure measured today at 118/72. She is on enalapril and carvedilol.. Continue current meds at current dosing   HYPERCHOLESTEROLEMIA History of hyperlipidemia on atorvastatin 40 mg a day. We will check a lipid and liver profile       Brianna Harp MD [facility], [facility] 12/22/2014 10:54 AM

## 2014-12-22 NOTE — Assessment & Plan Note (Signed)
History of hypertension with blood pressure measured today at 118/72. She is on enalapril and carvedilol.. Continue current meds at current dosing

## 2014-12-22 NOTE — Assessment & Plan Note (Signed)
History of nonischemic cardio myopathy status post ICD implantation by Dr. Phillip Heal March 2006 currently followed by Dr. Sallyanne Kuster. Her most recent ejection fraction measured by 2-D echocardiogram 01/16/13 was 45-50%. She denies chest pain or shortness of breath.

## 2014-12-22 NOTE — Patient Instructions (Addendum)
Your physician recommends that you return for lab work FASTING  Your physician wants you to follow-up in: 1 year with Dr. Gwenlyn Found. You will receive a reminder letter in the mail two months in advance. If you don't receive a letter, please call our office to schedule the follow-up appointment.  Restart Coreg 3.125mg  twice a day.

## 2014-12-23 DIAGNOSIS — Z79899 Other long term (current) drug therapy: Secondary | ICD-10-CM | POA: Diagnosis not present

## 2014-12-23 DIAGNOSIS — E785 Hyperlipidemia, unspecified: Secondary | ICD-10-CM | POA: Diagnosis not present

## 2014-12-23 LAB — HEPATIC FUNCTION PANEL
ALT: 15 U/L (ref 0–35)
AST: 32 U/L (ref 0–37)
Albumin: 3.9 g/dL (ref 3.5–5.2)
Alkaline Phosphatase: 80 U/L (ref 39–117)
BILIRUBIN INDIRECT: 0.3 mg/dL (ref 0.2–1.2)
Bilirubin, Direct: 0.1 mg/dL (ref 0.0–0.3)
Total Bilirubin: 0.4 mg/dL (ref 0.2–1.2)
Total Protein: 7.1 g/dL (ref 6.0–8.3)

## 2014-12-23 LAB — LIPID PANEL
CHOL/HDL RATIO: 2.1 ratio
Cholesterol: 170 mg/dL (ref 0–200)
HDL: 81 mg/dL (ref >=46)
LDL CALC: 77 mg/dL (ref 0–99)
Triglycerides: 59 mg/dL (ref <150)
VLDL: 12 mg/dL (ref 0–40)

## 2014-12-24 ENCOUNTER — Encounter: Payer: Self-pay | Admitting: *Deleted

## 2015-01-06 ENCOUNTER — Other Ambulatory Visit: Payer: Self-pay

## 2015-01-06 DIAGNOSIS — Z1231 Encounter for screening mammogram for malignant neoplasm of breast: Secondary | ICD-10-CM

## 2015-01-13 ENCOUNTER — Other Ambulatory Visit: Payer: Self-pay | Admitting: Family Medicine

## 2015-01-21 ENCOUNTER — Encounter: Payer: Self-pay | Admitting: Cardiovascular Disease

## 2015-01-21 ENCOUNTER — Ambulatory Visit (INDEPENDENT_AMBULATORY_CARE_PROVIDER_SITE_OTHER): Payer: Medicare Other | Admitting: *Deleted

## 2015-01-21 DIAGNOSIS — I5032 Chronic diastolic (congestive) heart failure: Secondary | ICD-10-CM | POA: Diagnosis not present

## 2015-01-21 DIAGNOSIS — I429 Cardiomyopathy, unspecified: Secondary | ICD-10-CM | POA: Diagnosis not present

## 2015-01-21 DIAGNOSIS — Z95810 Presence of automatic (implantable) cardiac defibrillator: Secondary | ICD-10-CM

## 2015-01-21 DIAGNOSIS — I428 Other cardiomyopathies: Secondary | ICD-10-CM

## 2015-01-21 LAB — CUP PACEART INCLINIC DEVICE CHECK
Battery Voltage: 3.04 V
HIGH POWER IMPEDANCE MEASURED VALUE: 228 Ohm
HighPow Impedance: 342 Ohm
HighPow Impedance: 69 Ohm
Lead Channel Sensing Intrinsic Amplitude: 10.5 mV
Lead Channel Setting Pacing Amplitude: 2 V
Lead Channel Setting Sensing Sensitivity: 0.45 mV
MDC IDC MSMT LEADCHNL RV IMPEDANCE VALUE: 475 Ohm
MDC IDC MSMT LEADCHNL RV PACING THRESHOLD AMPLITUDE: 0.75 V
MDC IDC MSMT LEADCHNL RV PACING THRESHOLD PULSEWIDTH: 0.4 ms
MDC IDC SESS DTM: 20160630154016
MDC IDC SET LEADCHNL RV PACING PULSEWIDTH: 0.4 ms
MDC IDC SET ZONE DETECTION INTERVAL: 300 ms
MDC IDC STAT BRADY RV PERCENT PACED: 0 %
Zone Setting Detection Interval: 360 ms
Zone Setting Detection Interval: 430 ms

## 2015-01-21 NOTE — Progress Notes (Signed)
ICD check in clinic. Normal device function. Thresholds and sensing consistent with previous device measurements. Impedance trends stable over time. 1 NSVT episode- 8 beats @ 196bpm. Histogram distribution appropriate for patient and level of activity. No changes made this session. Device programmed at appropriate safety margins. Device programmed to optimize intrinsic conduction. Battery voltage 3.04V. Pt enrolled in remote follow-up. ROV with [facility] 04/20/15.

## 2015-02-04 ENCOUNTER — Ambulatory Visit
Admission: RE | Admit: 2015-02-04 | Discharge: 2015-02-04 | Disposition: A | Discharge status: Home / Self Care | Payer: Medicare Other | Source: Ambulatory Visit

## 2015-02-04 DIAGNOSIS — Z1231 Encounter for screening mammogram for malignant neoplasm of breast: Secondary | ICD-10-CM

## 2015-02-04 DIAGNOSIS — Z9012 Acquired absence of left breast and nipple: Secondary | ICD-10-CM | POA: Diagnosis not present

## 2015-03-16 DIAGNOSIS — S72141S Displaced intertrochanteric fracture of right femur, sequela: Secondary | ICD-10-CM | POA: Diagnosis not present

## 2015-03-16 DIAGNOSIS — M4150 Other secondary scoliosis, site unspecified: Secondary | ICD-10-CM | POA: Diagnosis not present

## 2015-03-17 ENCOUNTER — Other Ambulatory Visit: Payer: Self-pay | Admitting: Family Medicine

## 2015-04-13 ENCOUNTER — Other Ambulatory Visit: Payer: Self-pay | Admitting: Family Medicine

## 2015-04-20 ENCOUNTER — Encounter: Payer: Self-pay | Admitting: Cardiovascular Disease

## 2015-04-20 ENCOUNTER — Ambulatory Visit (INDEPENDENT_AMBULATORY_CARE_PROVIDER_SITE_OTHER): Payer: Medicare Other | Admitting: Cardiovascular Disease

## 2015-04-20 VITALS — BP 138/80 | HR 81 | Ht 62.0 in | Wt 121.0 lb

## 2015-04-20 DIAGNOSIS — I5032 Chronic diastolic (congestive) heart failure: Secondary | ICD-10-CM | POA: Diagnosis not present

## 2015-04-20 DIAGNOSIS — I10 Essential (primary) hypertension: Secondary | ICD-10-CM

## 2015-04-20 DIAGNOSIS — I4729 Other ventricular tachycardia: Secondary | ICD-10-CM

## 2015-04-20 DIAGNOSIS — I472 Ventricular tachycardia: Secondary | ICD-10-CM

## 2015-04-20 DIAGNOSIS — I429 Cardiomyopathy, unspecified: Secondary | ICD-10-CM | POA: Diagnosis not present

## 2015-04-20 DIAGNOSIS — Z95810 Presence of automatic (implantable) cardiac defibrillator: Secondary | ICD-10-CM | POA: Diagnosis not present

## 2015-04-20 DIAGNOSIS — I428 Other cardiomyopathies: Secondary | ICD-10-CM

## 2015-04-20 NOTE — Progress Notes (Signed)
Patient ID: Brianna [name], female   DOB: [birth year], 81 y.o.   MRN: [id number]     Cardiology Office Note   Date:  04/22/2015   ID:  Brianna [name], DOB 1930-02-28, MRN [id number]  PCP:  Donnamae Jude, MD  Cardiologist:  Quay Burow, M.D. ;  Sanda Klein, MD   Chief Complaint  Patient presents with  . 6 MONTH FOLOW UP  . Dizziness      History of Present Illness: Brianna [name] is a 81 y.o. female who presents for ICD follow-up.  Chest single chamber defibrillator that was implanted as primary prevention for nonischemic cardiomyopathy felt to be related to previous chemotherapy for left breast cancer. When the device was implanted in 2006 ejection fraction was 30%. Most recently her left ventricular ejection fraction was estimated at 45-50 percent by echocardiography. Her device has never delivered therapy or detected sustained ventricular tachycardia. She has had rare episodes of very brief paroxysmal atrial tachycardia and nonsustained ventricular tachycardia. There has been no ventricular pacing.  Of note her initial defibrillator lead was a Medtronic Sprint fidelis lead that had a fracture and she subsequently received a new lead in 2011, when she also received her current generator which is a Medtronic protecta VR device.    Her defibrillator is functioning normally and has a voltage of 3.02 V (RRT 2.63 V).lead parameters are normal.  She has no cardiovascular complaints    Past Medical History  Diagnosis Date  . Emphysema of lung   . Cancer     breast  . Depression   . Hypertension   . Hyperlipidemia   . Osteopenia   . Nonischemic cardiomyopathy   . Automatic implantable cardiac defibrillator in situ     Past Surgical History  Procedure Laterality Date  . Breast surgery      mastectomy  . Appendectomy  1943  . Tonsillectomy  1940  . Cardiac defibrillator placement  10/13/2004    Medtronic Maximo VR, model [id number], serial [id number]  . Pacemaker  insertion  06/17/2010    Medtronic [place] VR, model #[id number], serial [id number]  . Cardiac catheterization  12/18/2002    Normal coronary arteries and normal LV function  . Femur im nail Right 05/10/2014    Procedure: INTRAMEDULLARY (IM) NAIL FEMORAL;  Surgeon: Marianna Payment, MD;  Location: [facility];  Service: Orthopedics;  Laterality: Right;     Current Outpatient Prescriptions  Medication Sig Dispense Refill  . ADVAIR DISKUS 100-50 MCG/DOSE AEPB INHALE 1 PUFF INTO THE LUNGS 2 (TWO) TIMES DAILY. 60 each 1  . albuterol (PROAIR HFA) 108 (90 BASE) MCG/ACT inhaler Inhale 2 puffs into the lungs every 6 (six) hours as needed for wheezing or shortness of breath.    Marland Kitchen aspirin 81 MG tablet Take 81 mg by mouth daily.    Marland Kitchen atorvastatin (LIPITOR) 40 MG tablet Take 40 mg by mouth daily.    . Calcium Carbonate-Vitamin D (CALTRATE 600+D) 600-400 MG-UNIT per tablet Take 1 tablet by mouth daily.     . carvedilol (COREG) 3.125 MG tablet Take 1 tablet (3.125 mg total) by mouth 2 (two) times daily with a meal. 60 tablet 6  . enalapril (VASOTEC) 2.5 MG tablet TAKE 2 TABLETS (5 MG TOTAL) BY MOUTH DAILY. 90 tablet 4  . feeding supplement, ENSURE COMPLETE, (ENSURE COMPLETE) LIQD Take 237 mLs by mouth 3 (three) times daily between meals. 6 Bottle 1  . ferrous sulfate 325 (65 FE) MG tablet Take 325 mg by mouth 2 (  two) times daily with a meal.    . FLUoxetine (PROZAC) 10 MG capsule TAKE ONE CAPSULE BY MOUTH DAILY 30 capsule 2  . furosemide (LASIX) 20 MG tablet TAKE 1 TABLET (20 MG TOTAL) BY MOUTH DAILY. 90 tablet 2  . Multiple Vitamin (MULTI-VITAMIN DAILY PO) Take 1 tablet by mouth at bedtime.      No current facility-administered medications for this visit.    Allergies:   Atorvastatin; Claritin; and Crestor    Social History:  The patient  reports that she has been smoking Cigarettes.  She has never used smokeless tobacco. She reports that she drinks about 0.6 oz of alcohol per week. She reports that she  does not use illicit drugs.   Family History:  The patient's family history includes Cancer in her daughter; Heart disease in her brother, father, and mother; Hyperlipidemia in her sister.    ROS:  Please see the history of present illness.    Otherwise, review of systems positive for none.   All other systems are reviewed and negative.    PHYSICAL EXAM: VS:  BP 138/80 mmHg  Pulse 81  Ht 5\' 2"  (1.575 m)  Wt 121 lb (54.885 kg)  BMI 22.13 kg/m2 , BMI Body mass index is 22.13 kg/(m^2).  General: Alert, oriented x3, no distress Head: no evidence of trauma, PERRL, EOMI, no exophtalmos or lid lag, no myxedema, no xanthelasma; normal ears, nose and oropharynx Neck: normal jugular venous pulsations and no hepatojugular reflux; brisk carotid pulses without delay and no carotid bruits Chest: clear to auscultation, no signs of consolidation by percussion or palpation, normal fremitus, symmetrical and full respiratory excursions Cardiovascular: normal position and quality of the apical impulse, regular rhythm, normal first and paradoxically split second heart sounds, no murmurs, rubs or gallops Abdomen: no tenderness or distention, no masses by palpation, no abnormal pulsatility or arterial bruits, normal bowel sounds, no hepatosplenomegaly Extremities: no clubbing, cyanosis or edema; 2+ radial, ulnar and brachial pulses bilaterally; 2+ right femoral, posterior tibial and dorsalis pedis pulses; 2+ left femoral, posterior tibial and dorsalis pedis pulses; no subclavian or femoral bruits Neurological: grossly nonfocal Psych: euthymic mood, full affect   EKG:  EKG is ordered today. The ekg ordered today demonstrates normal sinus rhythm and left bundle branch block, QRS 128 ms, QTC 497 ms   Recent Labs: 05/13/2014: BUN 12; Creatinine, Ser 0.75; Potassium 3.9; Pro B Natriuretic peptide (BNP) 1882.0*; Sodium 137 06/17/2014: Hemoglobin 12.2; Platelets 211 12/22/2014: ALT 15    Lipid Panel      Component Value Date/Time   CHOL 170 12/22/2014 0945   TRIG 59 12/22/2014 0945   HDL 81 12/22/2014 0945   CHOLHDL 2.1 12/22/2014 0945   VLDL 12 12/22/2014 0945   LDLCALC 77 12/22/2014 0945   LDLDIRECT 147 01/27/2008      Wt Readings from Last 3 Encounters:  04/20/15 121 lb (54.885 kg)  12/22/14 122 lb 14.4 oz (55.747 kg)  10/19/14 119 lb 6.4 oz (54.159 kg)    .   ASSESSMENT AND PLAN:  Normal single chamber defibrillator function. It is anticipated that in a few years and the current device will also reach need for replacement. At that time she will be in her mid 43s. Her left ventricle systolic function is only mildly depressed. Her device has never detected sustained arrhythmia or deliver therapy. She does not require ventricular pacing. I think, therefore, when the time comes for defibrillator generator change out strong consideration should be given to not  replacing the device. I have asked her to think about this and discuss it with her family so that when the time comes she will already have a preferred approach. In the meantime will continue routine remote monitoring and yearly office checks.    Current medicines are reviewed at length with the patient today.  The patient does not have concerns regarding medicines.  The following changes have been made:  no change  Labs/ tests ordered today include:  Orders Placed This Encounter  Procedures  . EKG 12-Lead     Patient Instructions  Remote monitoring is used to monitor your ICD from home. This monitoring reduces the number of office visits required to check your device to one time per year. It allows [place] to monitor the functioning of your device to ensure it is working properly. You are scheduled for a device check from home on July 21, 2015. You may send your transmission at any time that day. If you have a wireless device, the transmission will be sent automatically. After your physician reviews your transmission, you  will receive a postcard with your next transmission date.  Dr. Sallyanne Kuster recommends that you schedule a follow-up appointment in: ONE YEAR        SignedSanda Klein, MD  04/22/2015 6:46 PM    Sanda Klein, MD, [facility] [phone] office [phone] pager

## 2015-04-20 NOTE — Patient Instructions (Signed)
Remote monitoring is used to monitor your ICD from home. This monitoring reduces the number of office visits required to check your device to one time per year. It allows [place] to monitor the functioning of your device to ensure it is working properly. You are scheduled for a device check from home on July 21, 2015. You may send your transmission at any time that day. If you have a wireless device, the transmission will be sent automatically. After your physician reviews your transmission, you will receive a postcard with your next transmission date.  Dr. Sallyanne Kuster recommends that you schedule a follow-up appointment in: [place]

## 2015-05-11 LAB — CUP PACEART INCLINIC DEVICE CHECK
Battery Voltage: 3.02 V
Brady Statistic RV Percent Paced: 0.1 % — CL
Date Time Interrogation Session: 20161018151445
HighPow Impedance: 74 Ohm
Implantable Lead Implant Date: 20111125
Implantable Lead Location: 753860
Lead Channel Setting Pacing Pulse Width: 0.4 ms
Lead Channel Setting Sensing Sensitivity: 0.45 mV
MDC IDC LEAD MODEL: 7122
MDC IDC MSMT LEADCHNL RV IMPEDANCE VALUE: 475 Ohm
MDC IDC MSMT LEADCHNL RV PACING THRESHOLD AMPLITUDE: 1 V
MDC IDC MSMT LEADCHNL RV PACING THRESHOLD PULSEWIDTH: 0.4 ms
MDC IDC MSMT LEADCHNL RV SENSING INTR AMPL: 11.5 mV
MDC IDC SET LEADCHNL RV PACING AMPLITUDE: 2 V

## 2015-06-01 ENCOUNTER — Encounter: Payer: Self-pay | Admitting: Internal Medicine

## 2015-06-17 ENCOUNTER — Other Ambulatory Visit: Payer: Self-pay | Admitting: Cardiovascular Disease

## 2015-07-17 ENCOUNTER — Other Ambulatory Visit: Payer: Self-pay | Admitting: Cardiovascular Disease

## 2015-07-17 ENCOUNTER — Other Ambulatory Visit: Payer: Self-pay | Admitting: Family Medicine

## 2015-07-21 ENCOUNTER — Telehealth: Payer: Self-pay | Admitting: Cardiology

## 2015-07-21 ENCOUNTER — Encounter: Payer: Medicare Other | Admitting: *Deleted

## 2015-07-21 NOTE — Telephone Encounter (Signed)
Spoke with pt and reminded pt of remote transmission that is due today. Pt verbalized understanding.   

## 2015-07-22 ENCOUNTER — Encounter: Payer: Self-pay | Admitting: Cardiology

## 2015-08-16 ENCOUNTER — Other Ambulatory Visit: Payer: Self-pay | Admitting: Cardiovascular Disease

## 2015-08-16 NOTE — Telephone Encounter (Signed)
Rx request sent to pharmacy.  

## 2015-10-01 ENCOUNTER — Encounter: Payer: Self-pay | Admitting: *Deleted

## 2015-10-18 ENCOUNTER — Other Ambulatory Visit: Payer: Self-pay | Admitting: Family Medicine

## 2015-11-29 ENCOUNTER — Emergency Department (HOSPITAL_COMMUNITY)
Admission: EM | Admit: 2015-11-29 | Discharge: 2015-11-29 | Disposition: A | Discharge status: Home / Self Care | Payer: No Typology Code available for payment source | Attending: Emergency Medicine | Admitting: Emergency Medicine

## 2015-11-29 ENCOUNTER — Emergency Department (HOSPITAL_COMMUNITY): Payer: No Typology Code available for payment source

## 2015-11-29 ENCOUNTER — Encounter (HOSPITAL_COMMUNITY): Payer: Self-pay | Admitting: Emergency Medicine

## 2015-11-29 DIAGNOSIS — F17210 Nicotine dependence, cigarettes, uncomplicated: Secondary | ICD-10-CM | POA: Insufficient documentation

## 2015-11-29 DIAGNOSIS — I10 Essential (primary) hypertension: Secondary | ICD-10-CM | POA: Insufficient documentation

## 2015-11-29 DIAGNOSIS — Z9889 Other specified postprocedural states: Secondary | ICD-10-CM | POA: Insufficient documentation

## 2015-11-29 DIAGNOSIS — Z7982 Long term (current) use of aspirin: Secondary | ICD-10-CM | POA: Diagnosis not present

## 2015-11-29 DIAGNOSIS — Y92410 Unspecified street and highway as the place of occurrence of the external cause: Secondary | ICD-10-CM | POA: Diagnosis not present

## 2015-11-29 DIAGNOSIS — Z853 Personal history of malignant neoplasm of breast: Secondary | ICD-10-CM | POA: Diagnosis not present

## 2015-11-29 DIAGNOSIS — Y9389 Activity, other specified: Secondary | ICD-10-CM | POA: Diagnosis not present

## 2015-11-29 DIAGNOSIS — Z95810 Presence of automatic (implantable) cardiac defibrillator: Secondary | ICD-10-CM | POA: Diagnosis not present

## 2015-11-29 DIAGNOSIS — F329 Major depressive disorder, single episode, unspecified: Secondary | ICD-10-CM | POA: Diagnosis not present

## 2015-11-29 DIAGNOSIS — S0990XA Unspecified injury of head, initial encounter: Secondary | ICD-10-CM | POA: Diagnosis present

## 2015-11-29 DIAGNOSIS — Z79899 Other long term (current) drug therapy: Secondary | ICD-10-CM | POA: Insufficient documentation

## 2015-11-29 DIAGNOSIS — E785 Hyperlipidemia, unspecified: Secondary | ICD-10-CM | POA: Diagnosis not present

## 2015-11-29 DIAGNOSIS — Y998 Other external cause status: Secondary | ICD-10-CM | POA: Insufficient documentation

## 2015-11-29 DIAGNOSIS — S060X0A Concussion without loss of consciousness, initial encounter: Secondary | ICD-10-CM

## 2015-11-29 NOTE — Discharge Instructions (Signed)
USE YOUR WALKER WHEN AMBULATING  Concussion, Adult A concussion is a brain injury. It is caused by:  A hit to the head.  A quick and sudden movement (jolt) of the head or neck. A concussion is usually not life threatening. Even so, it can cause serious problems. If you had a concussion before, you may have concussion-like problems after a hit to your head. HOME CARE General Instructions  Follow your doctor's directions carefully.  Take medicines only as told by your doctor.  Only take medicines your doctor says are safe.  Do not drink alcohol until your doctor says it is okay. Alcohol and some drugs can slow down healing. They can also put you at risk for further injury.  If you are having trouble remembering things, write them down.  Try to do one thing at a time if you get distracted easily. For example, do not watch TV while making dinner.  Talk to your family members or close friends when making important decisions.  Follow up with your doctor as told.  Watch your symptoms. Tell others to do the same. Serious problems can sometimes happen after a concussion. Older adults are more likely to have these problems.  Tell your teachers, school nurse, school counselor, coach, Product/process development scientist, or work Freight forwarder about your concussion. Tell them about what you can or cannot do. They should watch to see if:  It gets even harder for you to pay attention or concentrate.  It gets even harder for you to remember things or learn new things.  You need more time than normal to finish things.  You become annoyed (irritable) more than before.  You are not able to deal with stress as well.  You have more problems than before.  Rest. Make sure you:  Get plenty of sleep at night.  Go to sleep early.  Go to bed at the same time every day. Try to wake up at the same time.  Rest during the day.  Take naps when you feel tired.  Limit activities where you have to think a lot or  concentrate. These include:  Doing homework.  Doing work related to a job.  Watching TV.  Using the computer. Returning To Your Regular Activities Return to your normal activities slowly, not all at once. You must give your body and brain enough time to heal.   Do not play sports or do other athletic activities until your doctor says it is okay.  Ask your doctor when you can drive, ride a bicycle, or work other vehicles or machines. Never do these things if you feel dizzy.  Ask your doctor about when you can return to work or school. Preventing Another Concussion It is very important to avoid another brain injury, especially before you have healed. In rare cases, another injury can lead to permanent brain damage, brain swelling, or death. The risk of this is greatest during the first 7-10 days after your injury. Avoid injuries by:   Wearing a seat belt when riding in a car.  Not drinking too much alcohol.  Avoiding activities that could lead to a second concussion (such as contact sports).  Wearing a helmet when doing activities like:  Biking.  Skiing.  Skateboarding.  Skating.  Making your home safer by:  Removing things from the floor or stairways that could make you trip.  Using grab bars in bathrooms and handrails by stairs.  Placing non-slip mats on floors and in bathtubs.  Improve lighting in  dark areas. GET HELP IF:  It gets even harder for you to pay attention or concentrate.  It gets even harder for you to remember things or learn new things.  You need more time than normal to finish things.  You become annoyed (irritable) more than before.  You are not able to deal with stress as well.  You have more problems than before.  You have problems keeping your balance.  You are not able to react quickly when you should. Get help if you have any of these problems for more than 2 weeks:   Lasting (chronic) headaches.  Dizziness or trouble  balancing.  Feeling sick to your stomach (nausea).  Seeing (vision) problems.  Being affected by noises or light more than normal.  Feeling sad, low, down in the dumps, blue, gloomy, or empty (depressed).  Mood changes (mood swings).  Feeling of fear or nervousness about what may happen (anxiety).  Feeling annoyed.  Memory problems.  Problems concentrating or paying attention.  Sleep problems.  Feeling tired all the time. GET HELP RIGHT AWAY IF:   You have bad headaches or your headaches get worse.  You have weakness (even if it is in one hand, leg, or part of the face).  You have loss of feeling (numbness).  You feel off balance.  You keep throwing up (vomiting).  You feel tired.  One black center of your eye (pupil) is larger than the other.  You twitch or shake violently (convulse).  Your speech is not clear (slurred).  You are more confused, easily angered (agitated), or annoyed than before.  You have more trouble resting than before.  You are unable to recognize people or places.  You have neck pain.  It is difficult to wake you up.  You have unusual behavior changes.  You pass out (lose consciousness). MAKE SURE YOU:   Understand these instructions.  Will watch your condition.  Will get help right away if you are not doing well or get worse.   This information is not intended to replace advice given to you by your health care provider. Make sure you discuss any questions you have with your health care provider.   Document Released: 06/28/2009 Document Revised: 07/31/2014 Document Reviewed: 01/30/2013 Elsevier Interactive Patient Education [employer].
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
## 2015-11-29 NOTE — ED Notes (Signed)
MD at bedside. 

## 2015-12-13 DIAGNOSIS — S43304A Dislocation of unspecified parts of right shoulder girdle, initial encounter: Secondary | ICD-10-CM | POA: Diagnosis not present

## 2015-12-13 DIAGNOSIS — Z9012 Acquired absence of left breast and nipple: Secondary | ICD-10-CM | POA: Diagnosis not present

## 2015-12-13 DIAGNOSIS — J45909 Unspecified asthma, uncomplicated: Secondary | ICD-10-CM | POA: Diagnosis not present

## 2015-12-13 DIAGNOSIS — Z95810 Presence of automatic (implantable) cardiac defibrillator: Secondary | ICD-10-CM | POA: Diagnosis not present

## 2015-12-13 DIAGNOSIS — I447 Left bundle-branch block, unspecified: Secondary | ICD-10-CM | POA: Diagnosis not present

## 2015-12-13 DIAGNOSIS — S42291A Other displaced fracture of upper end of right humerus, initial encounter for closed fracture: Secondary | ICD-10-CM | POA: Diagnosis not present

## 2015-12-13 DIAGNOSIS — R42 Dizziness and giddiness: Secondary | ICD-10-CM | POA: Diagnosis not present

## 2015-12-13 DIAGNOSIS — Z853 Personal history of malignant neoplasm of breast: Secondary | ICD-10-CM | POA: Diagnosis not present

## 2015-12-13 DIAGNOSIS — S42251A Displaced fracture of greater tuberosity of right humerus, initial encounter for closed fracture: Secondary | ICD-10-CM | POA: Diagnosis not present

## 2015-12-13 DIAGNOSIS — Z87891 Personal history of nicotine dependence: Secondary | ICD-10-CM | POA: Diagnosis not present

## 2015-12-17 DIAGNOSIS — S42291A Other displaced fracture of upper end of right humerus, initial encounter for closed fracture: Secondary | ICD-10-CM | POA: Diagnosis not present

## 2015-12-22 ENCOUNTER — Ambulatory Visit: Payer: Federal, State, Local not specified - PPO | Admitting: Cardiovascular Disease

## 2015-12-27 ENCOUNTER — Emergency Department (HOSPITAL_COMMUNITY): Payer: Medicare Other

## 2015-12-27 ENCOUNTER — Encounter (HOSPITAL_COMMUNITY): Payer: Self-pay | Admitting: Emergency Medicine

## 2015-12-27 ENCOUNTER — Emergency Department (HOSPITAL_COMMUNITY)
Admission: EM | Admit: 2015-12-27 | Discharge: 2015-12-27 | Disposition: A | Discharge status: Home / Self Care | Payer: Medicare Other | Attending: Emergency Medicine | Admitting: Emergency Medicine

## 2015-12-27 ENCOUNTER — Inpatient Hospital Stay (HOSPITAL_COMMUNITY): Admit: 2015-12-27 | Discharge: 2015-12-27 | Disposition: A | Discharge status: Home / Self Care | Payer: Medicare Other

## 2015-12-27 DIAGNOSIS — W19XXXA Unspecified fall, initial encounter: Secondary | ICD-10-CM

## 2015-12-27 DIAGNOSIS — Z7982 Long term (current) use of aspirin: Secondary | ICD-10-CM | POA: Insufficient documentation

## 2015-12-27 DIAGNOSIS — Y9389 Activity, other specified: Secondary | ICD-10-CM | POA: Insufficient documentation

## 2015-12-27 DIAGNOSIS — J439 Emphysema, unspecified: Secondary | ICD-10-CM | POA: Insufficient documentation

## 2015-12-27 DIAGNOSIS — T148 Other injury of unspecified body region: Secondary | ICD-10-CM | POA: Diagnosis not present

## 2015-12-27 DIAGNOSIS — S42211A Unspecified displaced fracture of surgical neck of right humerus, initial encounter for closed fracture: Secondary | ICD-10-CM | POA: Insufficient documentation

## 2015-12-27 DIAGNOSIS — Z853 Personal history of malignant neoplasm of breast: Secondary | ICD-10-CM | POA: Insufficient documentation

## 2015-12-27 DIAGNOSIS — Z95810 Presence of automatic (implantable) cardiac defibrillator: Secondary | ICD-10-CM | POA: Insufficient documentation

## 2015-12-27 DIAGNOSIS — Y92009 Unspecified place in unspecified non-institutional (private) residence as the place of occurrence of the external cause: Secondary | ICD-10-CM | POA: Insufficient documentation

## 2015-12-27 DIAGNOSIS — F17210 Nicotine dependence, cigarettes, uncomplicated: Secondary | ICD-10-CM | POA: Insufficient documentation

## 2015-12-27 DIAGNOSIS — E785 Hyperlipidemia, unspecified: Secondary | ICD-10-CM | POA: Insufficient documentation

## 2015-12-27 DIAGNOSIS — M79601 Pain in right arm: Secondary | ICD-10-CM | POA: Diagnosis not present

## 2015-12-27 DIAGNOSIS — I10 Essential (primary) hypertension: Secondary | ICD-10-CM | POA: Insufficient documentation

## 2015-12-27 DIAGNOSIS — Y998 Other external cause status: Secondary | ICD-10-CM | POA: Insufficient documentation

## 2015-12-27 DIAGNOSIS — Z79899 Other long term (current) drug therapy: Secondary | ICD-10-CM | POA: Insufficient documentation

## 2015-12-27 DIAGNOSIS — S42201A Unspecified fracture of upper end of right humerus, initial encounter for closed fracture: Secondary | ICD-10-CM

## 2015-12-27 DIAGNOSIS — W08XXXA Fall from other furniture, initial encounter: Secondary | ICD-10-CM | POA: Insufficient documentation

## 2015-12-27 MED ORDER — TRAMADOL HCL 50 MG PO TABS: 50.0000 mg | ORAL_TABLET | Freq: Four times a day (QID) | ORAL | Status: DC | PRN

## 2015-12-27 NOTE — ED Notes (Signed)
Arrived via EMS. History of right upper arm fracture. Today getting up from a couch using left arm and left arm slipped and patient landed on floor right arm.  EMS administered Zofran 4mg  IVP and Fentanyl 50 mcg IVP.  Radial pulses +2 ecchymosis dark blue right upper arm.

## 2015-12-27 NOTE — ED Provider Notes (Signed)
CSN: [id number]     Arrival date & time 12/27/15  1011 History   First MD Initiated Contact with Patient 12/27/15 1014     Chief Complaint  Patient presents with  . Fall  . Arm Pain      HPI Patient presents the emergency department with complaints of fall this morning her house.  2 weeks ago she injured her right shoulder and has a proximal right humerus fracture.  Today she states that she slipped off the couch and reinjured her right shoulder with increasing right shoulder pain.  She denies numbness or weakness in her right hand.  She denies head injury.  No loss consciousness.  Denies neck pain.  No chest pain shortness breath.  Denies abdominal pain.  Reports no pain in her hips, knees, ankles.  Pain is moderate in severity.  She received fentanyl and route with improvement in her pain.  She was nauseated earlier but reports her nausea has resolved.   Past Medical History  Diagnosis Date  . Emphysema of lung ([place])   . Cancer ([place])     breast  . Depression   . Hypertension   . Hyperlipidemia   . Osteopenia   . Nonischemic cardiomyopathy ([place])   . Automatic implantable cardiac defibrillator in situ    Past Surgical History  Procedure Laterality Date  . Breast surgery      mastectomy  . Appendectomy  1943  . Tonsillectomy  1940  . Cardiac defibrillator placement  10/13/2004    Medtronic Maximo VR, model [id number], serial [id number]  . Pacemaker insertion  06/17/2010    Medtronic [place] VR, model #[id number], serial [id number]  . Cardiac catheterization  12/18/2002    Normal coronary arteries and normal LV function  . Femur im nail Right 05/10/2014    Procedure: INTRAMEDULLARY (IM) NAIL FEMORAL;  Surgeon: Marianna Payment, MD;  Location: [facility];  Service: Orthopedics;  Laterality: Right;   Family History  Problem Relation Age of Onset  . Heart disease Mother     Pacemaker  . Heart disease Father     No details.    . Hyperlipidemia Sister   . Heart disease Brother    "Heart Attacks"  Died age 73  . Cancer Daughter     Oral cancer and "knot" on right cheek that was cancer   Social History  Substance Use Topics  . Smoking status: Current Some Day Smoker    Types: Cigarettes  . Smokeless tobacco: Never Used     Comment: Pt reports smoking about 1-2 times a week when stressed.   . Alcohol Use: 0.6 oz/week    1 Glasses of wine per week   OB History    No data available     Review of Systems  All other systems reviewed and are negative.     Allergies  Atorvastatin; Claritin; and Crestor  Home Medications   Prior to Admission medications   Medication Sig Start Date End Date Taking? Authorizing Provider  ADVAIR DISKUS 100-50 MCG/DOSE AEPB INHALE 1 PUFF INTO THE LUNGS 2 (TWO) TIMES DAILY. 03/18/15   Donnamae Jude, MD  albuterol (PROAIR HFA) 108 (90 BASE) MCG/ACT inhaler Inhale 2 puffs into the lungs every 6 (six) hours as needed for wheezing or shortness of breath.    Historical Provider, MD  aspirin 81 MG tablet Take 81 mg by mouth daily.    Historical Provider, MD  atorvastatin (LIPITOR) 40 MG tablet Take 40 mg by mouth daily.  Historical Provider, MD  Calcium Carbonate-Vitamin D (CALTRATE 600+D) 600-400 MG-UNIT per tablet Take 1 tablet by mouth daily.     Historical Provider, MD  carvedilol (COREG) 3.125 MG tablet TAKE 1 TABLET (3.125 MG TOTAL) BY MOUTH 2 (TWO) TIMES DAILY WITH A MEAL. 07/20/15   Mihai Croitoru, MD  enalapril (VASOTEC) 2.5 MG tablet TAKE 2 TABLETS BY MOUTH DAILY 06/21/15   Lorretta Harp, MD  feeding supplement, ENSURE COMPLETE, (ENSURE COMPLETE) LIQD Take 237 mLs by mouth 3 (three) times daily between meals. 05/12/14   Hilton Sinclair, MD  FLUoxetine (PROZAC) 10 MG capsule TAKE ONE CAPSULE BY MOUTH DAILY 10/18/15   Donnamae Jude, MD  furosemide (LASIX) 20 MG tablet TAKE 1 TABLET BY MOUTH DAILY 08/16/15   Mihai Croitoru, MD  Multiple Vitamin (MULTI-VITAMIN DAILY PO) Take 1 tablet by mouth at bedtime.     Historical  Provider, MD  traMADol (ULTRAM) 50 MG tablet Take 1 tablet (50 mg total) by mouth every 6 (six) hours as needed. 12/27/15   Jola Schmidt, MD   BP 146/76 mmHg  Pulse 80  Temp(Src) 97.9 F (36.6 C) (Oral)  Resp 18  SpO2 93% Physical Exam  Constitutional: She is oriented to person, place, and time. She appears well-developed and well-nourished. No distress.  HENT:  Head: Normocephalic and atraumatic.  Eyes: EOM are normal.  Neck: Normal range of motion. Neck supple.  C-spine nontender  Cardiovascular: Normal rate, regular rhythm and normal heart sounds.   Pulmonary/Chest: Effort normal and breath sounds normal.  Abdominal: Soft. She exhibits no distension. There is no tenderness.  Musculoskeletal: Normal range of motion.  Limited range of motion of right shoulder secondary to pain.  Obvious bruising without obvious deformity of the right shoulder.  Normal right radial pulse.  Normal grip strength right hand.  Full range of motion bilateral hips, knees, ankles.  Full range of motion of right wrist and right elbow as well as left shoulder, left elbow, left wrist.  Neurological: She is alert and oriented to person, place, and time.  Skin: Skin is warm and dry.  Psychiatric: She has a normal mood and affect. Judgment normal.  Nursing note and vitals reviewed.   ED Course  Procedures (including critical care time) Labs Review Labs Reviewed - No data to display  Imaging Review Dg Shoulder Right  12/27/2015  CLINICAL DATA:  [place] from bed. EXAM: RIGHT SHOULDER - 2+ VIEW COMPARISON:  12/27/2015 FINDINGS: There is a comminuted fracture involving the surgical neck of humerus as well as the greater tuberosity. No dislocation. Mild medial displacement of the distal fracture fragments. IMPRESSION: 1. Acute fracture involves the surgical neck and greater tuberosity of the proximal humerus. Electronically Signed   By: Kerby Moors M.D.   On: 12/27/2015 11:35   Dg Humerus Right  12/27/2015  CLINICAL  DATA:  Status post fall out of bed. EXAM: RIGHT HUMERUS - 2+ VIEW COMPARISON:  None. FINDINGS: Comminuted fracture of the surgical neck of the right proximal humerus with 2 cm of medial displacement. Comminuted fracture involves the lesser tuberosity with approximately 9 mm of lateral displacement of the greater tuberosity. No other fracture or dislocation. IMPRESSION: Comminuted, displaced fracture of the surgical neck of the right proximal humerus. Electronically Signed   By: Kathreen Devoid   On: 12/27/2015 11:31   I have personally reviewed and evaluated these images and lab results as part of my medical decision-making.   EKG Interpretation None  MDM   Final diagnoses:  Fracture, humerus, proximal, right, closed, initial encounter  Fall, initial encounter    Recurrent injury of right proximal humerus.  Sling for immobilization.  Orthopedic follow-up.  C-spine nontender.  No indication for CT imaging of the head.  Full range of motion bilateral hips.  No other long bone fractures.  Patient understands to return to the ER for new or worsening symptoms.    Jola Schmidt, MD 12/27/15 1200

## 2015-12-27 NOTE — Discharge Instructions (Signed)
Humerus Fracture Treated With Immobilization °The humerus is the large bone in your upper arm. You have a broken (fractured) humerus. These fractures are easily diagnosed with X-rays. °TREATMENT  °Simple fractures which will heal without disability are treated with simple immobilization. Immobilization means you will wear a cast, splint, or sling. You have a fracture which will do well with immobilization. The fracture will heal well simply by being held in a good position until it is stable enough to begin range of motion exercises. Do not take part in activities which would further injure your arm.  °HOME CARE INSTRUCTIONS  °· Put ice on the injured area. °¨ Put ice in a plastic bag. °¨ Place a towel between your skin and the bag. °¨ Leave the ice on for 15-20 minutes, 03-04 times a day. °· If you have a cast: °¨ Do not scratch the skin under the cast using sharp or pointed objects. °¨ Check the skin around the cast every day. You may put lotion on any red or sore areas. °¨ Keep your cast dry and clean. °· If you have a splint: °¨ Wear the splint as directed. °¨ Keep your splint dry and clean. °¨ You may loosen the elastic around the splint if your fingers become numb, tingle, or turn cold or blue. °· If you have a sling: °¨ Wear the sling as directed. °· Do not put pressure on any part of your cast or splint until it is fully hardened. °· Your cast or splint can be protected during bathing with a plastic bag. Do not lower the cast or splint into water. °· Only take over-the-counter or prescription medicines for pain, discomfort, or fever as directed by your caregiver. °· Do range of motion exercises as instructed by your caregiver. °· Follow up as directed by your caregiver. This is very important in order to avoid permanent injury or disability and chronic pain. °SEEK IMMEDIATE MEDICAL CARE IF:  °· Your skin or nails in the injured arm turn blue or gray. °· Your arm feels cold or numb. °· You develop severe pain  in the injured arm. °· You are having problems with the medicines you were given. °MAKE SURE YOU:  °· Understand these instructions. °· Will watch your condition. °· Will get help right away if you are not doing well or get worse. °  °This information is not intended to replace advice given to you by your health care provider. Make sure you discuss any questions you have with your health care provider. °  °Document Released: 10/16/2000 Document Revised: 07/31/2014 Document Reviewed: 12/02/2014 °Elsevier Interactive Patient Education ©2016 [employer] ° °

## 2015-12-28 ENCOUNTER — Emergency Department (HOSPITAL_COMMUNITY): Payer: Medicare Other

## 2015-12-28 ENCOUNTER — Encounter (HOSPITAL_COMMUNITY): Payer: Self-pay | Admitting: *Deleted

## 2015-12-28 ENCOUNTER — Inpatient Hospital Stay (HOSPITAL_COMMUNITY)
Admission: EM | Admit: 2015-12-28 | Discharge: 2015-12-30 | DRG: 640 | Disposition: A | Discharge status: Home Health | Payer: Medicare Other | Attending: Internal Medicine | Admitting: Internal Medicine

## 2015-12-28 DIAGNOSIS — S199XXA Unspecified injury of neck, initial encounter: Secondary | ICD-10-CM | POA: Diagnosis not present

## 2015-12-28 DIAGNOSIS — E869 Volume depletion, unspecified: Principal | ICD-10-CM

## 2015-12-28 DIAGNOSIS — Z66 Do not resuscitate: Secondary | ICD-10-CM | POA: Diagnosis present

## 2015-12-28 DIAGNOSIS — S299XXA Unspecified injury of thorax, initial encounter: Secondary | ICD-10-CM | POA: Diagnosis not present

## 2015-12-28 DIAGNOSIS — Z950 Presence of cardiac pacemaker: Secondary | ICD-10-CM

## 2015-12-28 DIAGNOSIS — M8580 Other specified disorders of bone density and structure, unspecified site: Secondary | ICD-10-CM | POA: Diagnosis present

## 2015-12-28 DIAGNOSIS — Z7982 Long term (current) use of aspirin: Secondary | ICD-10-CM

## 2015-12-28 DIAGNOSIS — Z8249 Family history of ischemic heart disease and other diseases of the circulatory system: Secondary | ICD-10-CM | POA: Diagnosis not present

## 2015-12-28 DIAGNOSIS — I10 Essential (primary) hypertension: Secondary | ICD-10-CM | POA: Diagnosis present

## 2015-12-28 DIAGNOSIS — S42309A Unspecified fracture of shaft of humerus, unspecified arm, initial encounter for closed fracture: Secondary | ICD-10-CM | POA: Diagnosis not present

## 2015-12-28 DIAGNOSIS — E7800 Pure hypercholesterolemia, unspecified: Secondary | ICD-10-CM | POA: Diagnosis present

## 2015-12-28 DIAGNOSIS — S42291A Other displaced fracture of upper end of right humerus, initial encounter for closed fracture: Secondary | ICD-10-CM | POA: Diagnosis present

## 2015-12-28 DIAGNOSIS — R079 Chest pain, unspecified: Secondary | ICD-10-CM | POA: Diagnosis not present

## 2015-12-28 DIAGNOSIS — R937 Abnormal findings on diagnostic imaging of other parts of musculoskeletal system: Secondary | ICD-10-CM | POA: Diagnosis not present

## 2015-12-28 DIAGNOSIS — R51 Headache: Secondary | ICD-10-CM | POA: Diagnosis not present

## 2015-12-28 DIAGNOSIS — Z853 Personal history of malignant neoplasm of breast: Secondary | ICD-10-CM

## 2015-12-28 DIAGNOSIS — J440 Chronic obstructive pulmonary disease with acute lower respiratory infection: Secondary | ICD-10-CM | POA: Diagnosis present

## 2015-12-28 DIAGNOSIS — Z9010 Acquired absence of unspecified breast and nipple: Secondary | ICD-10-CM

## 2015-12-28 DIAGNOSIS — M899 Disorder of bone, unspecified: Secondary | ICD-10-CM

## 2015-12-28 DIAGNOSIS — R55 Syncope and collapse: Secondary | ICD-10-CM | POA: Diagnosis present

## 2015-12-28 DIAGNOSIS — I429 Cardiomyopathy, unspecified: Secondary | ICD-10-CM | POA: Diagnosis present

## 2015-12-28 DIAGNOSIS — W1830XA Fall on same level, unspecified, initial encounter: Secondary | ICD-10-CM | POA: Diagnosis present

## 2015-12-28 DIAGNOSIS — E785 Hyperlipidemia, unspecified: Secondary | ICD-10-CM | POA: Diagnosis present

## 2015-12-28 DIAGNOSIS — F17210 Nicotine dependence, cigarettes, uncomplicated: Secondary | ICD-10-CM | POA: Diagnosis not present

## 2015-12-28 DIAGNOSIS — Z79899 Other long term (current) drug therapy: Secondary | ICD-10-CM | POA: Diagnosis not present

## 2015-12-28 DIAGNOSIS — M542 Cervicalgia: Secondary | ICD-10-CM | POA: Diagnosis not present

## 2015-12-28 DIAGNOSIS — S42211A Unspecified displaced fracture of surgical neck of right humerus, initial encounter for closed fracture: Secondary | ICD-10-CM | POA: Diagnosis not present

## 2015-12-28 DIAGNOSIS — S0101XA Laceration without foreign body of scalp, initial encounter: Secondary | ICD-10-CM | POA: Diagnosis present

## 2015-12-28 DIAGNOSIS — J189 Pneumonia, unspecified organism: Secondary | ICD-10-CM | POA: Diagnosis present

## 2015-12-28 DIAGNOSIS — Z888 Allergy status to other drugs, medicaments and biological substances status: Secondary | ICD-10-CM | POA: Diagnosis not present

## 2015-12-28 DIAGNOSIS — S42301A Unspecified fracture of shaft of humerus, right arm, initial encounter for closed fracture: Secondary | ICD-10-CM | POA: Diagnosis not present

## 2015-12-28 DIAGNOSIS — J439 Emphysema, unspecified: Secondary | ICD-10-CM | POA: Diagnosis not present

## 2015-12-28 DIAGNOSIS — F329 Major depressive disorder, single episode, unspecified: Secondary | ICD-10-CM | POA: Diagnosis present

## 2015-12-28 DIAGNOSIS — I621 Nontraumatic extradural hemorrhage: Secondary | ICD-10-CM | POA: Diagnosis not present

## 2015-12-28 DIAGNOSIS — R296 Repeated falls: Secondary | ICD-10-CM

## 2015-12-28 DIAGNOSIS — Z95810 Presence of automatic (implantable) cardiac defibrillator: Secondary | ICD-10-CM | POA: Diagnosis not present

## 2015-12-28 DIAGNOSIS — S0990XA Unspecified injury of head, initial encounter: Secondary | ICD-10-CM | POA: Diagnosis not present

## 2015-12-28 DIAGNOSIS — S0181XA Laceration without foreign body of other part of head, initial encounter: Secondary | ICD-10-CM | POA: Diagnosis not present

## 2015-12-28 DIAGNOSIS — J449 Chronic obstructive pulmonary disease, unspecified: Secondary | ICD-10-CM | POA: Diagnosis present

## 2015-12-28 DIAGNOSIS — S098XXA Other specified injuries of head, initial encounter: Secondary | ICD-10-CM | POA: Diagnosis not present

## 2015-12-28 LAB — COMPREHENSIVE METABOLIC PANEL
ALT: 12 U/L — ABNORMAL LOW (ref 14–54)
ANION GAP: 8 (ref 5–15)
AST: 23 U/L (ref 15–41)
Albumin: 3.4 g/dL — ABNORMAL LOW (ref 3.5–5.0)
Alkaline Phosphatase: 103 U/L (ref 38–126)
BILIRUBIN TOTAL: 0.8 mg/dL (ref 0.3–1.2)
BUN: 13 mg/dL (ref 6–20)
CO2: 27 mmol/L (ref 22–32)
Calcium: 8.8 mg/dL — ABNORMAL LOW (ref 8.9–10.3)
Chloride: 102 mmol/L (ref 101–111)
Creatinine, Ser: 0.68 mg/dL (ref 0.44–1.00)
GFR calc Af Amer: >60 mL/min (ref >60)
Glucose, Bld: 100 mg/dL — ABNORMAL HIGH (ref 65–99)
POTASSIUM: 4 mmol/L (ref 3.5–5.1)
Sodium: 137 mmol/L (ref 135–145)
TOTAL PROTEIN: 7.1 g/dL (ref 6.5–8.1)

## 2015-12-28 LAB — CBC WITH DIFFERENTIAL/PLATELET
Basophils Absolute: 0 10*3/uL (ref 0.0–0.1)
Basophils Relative: 0 %
EOS PCT: 2 %
Eosinophils Absolute: 0.2 10*3/uL (ref 0.0–0.7)
HEMATOCRIT: 31.4 % — AB (ref 36.0–46.0)
Hemoglobin: 10.3 g/dL — ABNORMAL LOW (ref 12.0–15.0)
LYMPHS PCT: 16 %
Lymphs Abs: 1.3 10*3/uL (ref 0.7–4.0)
MCH: 30.5 pg (ref 26.0–34.0)
MCHC: 32.8 g/dL (ref 30.0–36.0)
MCV: 92.9 fL (ref 78.0–100.0)
MONO ABS: 0.7 10*3/uL (ref 0.1–1.0)
Monocytes Relative: 8 %
NEUTROS ABS: 6.2 10*3/uL (ref 1.7–7.7)
Neutrophils Relative %: 74 %
PLATELETS: 334 10*3/uL (ref 150–400)
RBC: 3.38 MIL/uL — ABNORMAL LOW (ref 3.87–5.11)
RDW: 13.4 % (ref 11.5–15.5)
WBC: 8.4 10*3/uL (ref 4.0–10.5)

## 2015-12-28 LAB — CBC
HCT: 33 % — ABNORMAL LOW (ref 36.0–46.0)
Hemoglobin: 10.8 g/dL — ABNORMAL LOW (ref 12.0–15.0)
MCH: 30.3 pg (ref 26.0–34.0)
MCHC: 32.7 g/dL (ref 30.0–36.0)
MCV: 92.7 fL (ref 78.0–100.0)
Platelets: 340 10*3/uL (ref 150–400)
RBC: 3.56 MIL/uL — ABNORMAL LOW (ref 3.87–5.11)
RDW: 13.4 % (ref 11.5–15.5)
WBC: 8.9 10*3/uL (ref 4.0–10.5)

## 2015-12-28 LAB — URINALYSIS, ROUTINE W REFLEX MICROSCOPIC
BILIRUBIN URINE: NEGATIVE
Glucose, UA: NEGATIVE mg/dL
Hgb urine dipstick: NEGATIVE
KETONES UR: NEGATIVE mg/dL
Leukocytes, UA: NEGATIVE
NITRITE: NEGATIVE
Protein, ur: NEGATIVE mg/dL
Specific Gravity, Urine: 1.011 (ref 1.005–1.030)
pH: 7.5 (ref 5.0–8.0)

## 2015-12-28 LAB — TROPONIN I
Troponin I: 0.03 ng/mL (ref <0.031)
Troponin I: <0.03 ng/mL (ref <0.031)

## 2015-12-28 LAB — CREATININE, SERUM
Creatinine, Ser: 0.7 mg/dL (ref 0.44–1.00)
GFR calc Af Amer: >60 mL/min (ref >60)
GFR calc non Af Amer: >60 mL/min (ref >60)

## 2015-12-28 LAB — I-STAT TROPONIN, ED: TROPONIN I, POC: 0.01 ng/mL (ref 0.00–0.08)

## 2015-12-28 MED ORDER — MOMETASONE FURO-FORMOTEROL FUM 100-5 MCG/ACT IN AERO
2.0000 | INHALATION_SPRAY | Freq: Two times a day (BID) | RESPIRATORY_TRACT | Status: DC
Start: 2015-12-28 — End: 2015-12-30
  Administered 2015-12-28 – 2015-12-30 (×4): 2 via RESPIRATORY_TRACT
  Filled 2015-12-28: qty 8.8

## 2015-12-28 MED ORDER — FLUOXETINE HCL 10 MG PO CAPS
10.0000 mg | ORAL_CAPSULE | Freq: Every day | ORAL | Status: DC
  Administered 2015-12-29 – 2015-12-30 (×2): 10 mg via ORAL
  Filled 2015-12-28 (×3): qty 1

## 2015-12-28 MED ORDER — ENOXAPARIN SODIUM 40 MG/0.4ML ~~LOC~~ SOLN
40.0000 mg | SUBCUTANEOUS | Status: DC
  Administered 2015-12-28 – 2015-12-29 (×2): 40 mg via SUBCUTANEOUS
  Filled 2015-12-28 (×2): qty 0.4

## 2015-12-28 MED ORDER — IPRATROPIUM-ALBUTEROL 0.5-2.5 (3) MG/3ML IN SOLN
3.0000 mL | Freq: Three times a day (TID) | RESPIRATORY_TRACT | Status: DC
  Administered 2015-12-29 – 2015-12-30 (×4): 3 mL via RESPIRATORY_TRACT
  Filled 2015-12-28 (×5): qty 3

## 2015-12-28 MED ORDER — MORPHINE SULFATE (PF) 2 MG/ML IV SOLN
2.0000 mg | INTRAVENOUS | Status: DC | PRN
  Administered 2015-12-28 – 2015-12-29 (×3): 1 mg via INTRAVENOUS
  Administered 2015-12-30: 2 mg via INTRAVENOUS
  Filled 2015-12-28 (×4): qty 1

## 2015-12-28 MED ORDER — ENSURE ENLIVE PO LIQD
237.0000 mL | Freq: Every day | ORAL | Status: DC
  Administered 2015-12-29 – 2015-12-30 (×2): 237 mL via ORAL

## 2015-12-28 MED ORDER — DEXTROSE 5 % IV SOLN
1.0000 g | Freq: Once | INTRAVENOUS | Status: AC
  Administered 2015-12-28: 1 g via INTRAVENOUS
  Filled 2015-12-28: qty 10

## 2015-12-28 MED ORDER — DEXTROSE 5 % IV SOLN
500.0000 mg | INTRAVENOUS | Status: DC
  Administered 2015-12-29: 500 mg via INTRAVENOUS
  Filled 2015-12-28 (×2): qty 500

## 2015-12-28 MED ORDER — MECLIZINE HCL 25 MG PO TABS
12.5000 mg | ORAL_TABLET | Freq: Three times a day (TID) | ORAL | Status: AC
  Administered 2015-12-28 – 2015-12-30 (×5): 12.5 mg via ORAL
  Filled 2015-12-28 (×6): qty 0.5

## 2015-12-28 MED ORDER — IPRATROPIUM-ALBUTEROL 0.5-2.5 (3) MG/3ML IN SOLN
3.0000 mL | Freq: Four times a day (QID) | RESPIRATORY_TRACT | Status: DC
  Administered 2015-12-28: 3 mL via RESPIRATORY_TRACT
  Filled 2015-12-28: qty 3

## 2015-12-28 MED ORDER — ENALAPRIL MALEATE 5 MG PO TABS
5.0000 mg | ORAL_TABLET | Freq: Every day | ORAL | Status: DC
  Administered 2015-12-29 – 2015-12-30 (×2): 5 mg via ORAL
  Filled 2015-12-28 (×3): qty 1

## 2015-12-28 MED ORDER — ATORVASTATIN CALCIUM 40 MG PO TABS
40.0000 mg | ORAL_TABLET | Freq: Every day | ORAL | Status: DC
  Administered 2015-12-29 – 2015-12-30 (×2): 40 mg via ORAL
  Filled 2015-12-28 (×2): qty 1

## 2015-12-28 MED ORDER — ASPIRIN EC 81 MG PO TBEC
81.0000 mg | DELAYED_RELEASE_TABLET | Freq: Every day | ORAL | Status: DC
  Administered 2015-12-29 – 2015-12-30 (×2): 81 mg via ORAL
  Filled 2015-12-28 (×2): qty 1

## 2015-12-28 MED ORDER — CALCIUM CARBONATE-VITAMIN D 500-200 MG-UNIT PO TABS
1.0000 | ORAL_TABLET | Freq: Every day | ORAL | Status: DC
  Administered 2015-12-29 – 2015-12-30 (×2): 1 via ORAL
  Filled 2015-12-28 (×4): qty 1

## 2015-12-28 MED ORDER — CARVEDILOL 3.125 MG PO TABS
3.1250 mg | ORAL_TABLET | Freq: Two times a day (BID) | ORAL | Status: DC
  Administered 2015-12-28 – 2015-12-30 (×4): 3.125 mg via ORAL
  Filled 2015-12-28 (×4): qty 1

## 2015-12-28 MED ORDER — DEXTROSE 5 % IV SOLN
500.0000 mg | Freq: Once | INTRAVENOUS | Status: AC
  Administered 2015-12-28: 500 mg via INTRAVENOUS
  Filled 2015-12-28: qty 500

## 2015-12-28 MED ORDER — DEXTROSE 5 % IV SOLN
1.0000 g | INTRAVENOUS | Status: DC
  Administered 2015-12-29 – 2015-12-30 (×2): 1 g via INTRAVENOUS
  Filled 2015-12-28 (×2): qty 10

## 2015-12-28 MED ORDER — ALBUTEROL SULFATE (2.5 MG/3ML) 0.083% IN NEBU
2.5000 mg | INHALATION_SOLUTION | RESPIRATORY_TRACT | Status: DC | PRN
  Administered 2015-12-30: 2.5 mg via RESPIRATORY_TRACT
  Filled 2015-12-28: qty 3

## 2015-12-28 MED ORDER — SODIUM CHLORIDE 0.9 % IV SOLN
INTRAVENOUS | Status: DC
  Administered 2015-12-29 (×2): via INTRAVENOUS

## 2015-12-28 MED ORDER — SODIUM CHLORIDE 0.9 % IV SOLN
Freq: Once | INTRAVENOUS | Status: AC
  Administered 2015-12-28: 12:00:00 via INTRAVENOUS

## 2015-12-28 MED ORDER — MORPHINE SULFATE (PF) 4 MG/ML IV SOLN
4.0000 mg | Freq: Once | INTRAVENOUS | Status: AC
  Administered 2015-12-28: 4 mg via INTRAVENOUS
  Filled 2015-12-28: qty 1

## 2015-12-28 MED ORDER — ADULT MULTIVITAMIN W/MINERALS CH
ORAL_TABLET | Freq: Every day | ORAL | Status: DC
  Administered 2015-12-28: 1 via ORAL
  Filled 2015-12-28 (×2): qty 1

## 2015-12-28 NOTE — ED Notes (Signed)
Bed: WA14 Expected date:  Expected time:  Means of arrival:  Comments: fall 

## 2015-12-28 NOTE — ED Notes (Signed)
MD at bedside.EDP 

## 2015-12-28 NOTE — Care Management Note (Signed)
Case Management Note  Patient Details  Name: Brianna [name] MRN: [id number] Date of Birth: June 21, 1930  Subjective/Objective: 82 y/o f admitted w/syncope, fall. From home. Has private care givers.PT cons-await recc.                   Action/Plan:d/c plan home.   Expected Discharge Date:   (unknown)               Expected Discharge Plan:  [place]  In-House Referral:     Discharge planning Services  CM Consult  Post Acute Care Choice:    Choice offered to:     DME Arranged:    DME Agency:     HH Arranged:    HH Agency:     Status of Service:  In process, will continue to follow  Medicare Important Message Given:    Date Medicare IM Given:    Medicare IM give by:    Date Additional Medicare IM Given:    Additional Medicare Important Message give by:     If discussed at [place] of Stay Meetings, dates discussed:    Additional Comments:  Dessa Phi, RN 12/28/2015, 3:42 PM

## 2015-12-28 NOTE — ED Notes (Signed)
Return from CT/XRAY

## 2015-12-28 NOTE — H&P (Signed)
History and Physical  Brianna [name] [id number] DOB: 1930-06-07 DOA: 12/28/2015  Referring physician: ER physician (Dr. Alfonse Spruce PCP: Donnamae Jude, MD  Outpatient Specialists:  Patient coming from: Home  Chief Complaint: Syncope   HPI: 82 year old female with history of recurrent falls, right breast cancer status post mastectomy and chemotherapy, recurrent fall with history of recent fall that resulted in communicated right humeral fracture about a week ago, HTN and emphysema. Patient presents following a syncopal episode that occurred when patient tried to stand from a sitting position. Similar event happened yesterday. Patient seems volume depleted. History of recurrent falls.  No chest pain, no neck pain, no headache, no fever or chills. CT of cervical spine revealed new C6-7 eorsive lesion that needs ruling out recurrent breast cancer. CXR also reveals right lower lobe infiltrate.  ED Course: Imaging studies. IV zithromax given in ER  Pertinent labs: Imaging studies EKG: Independently reviewed.   Review of Systems:  As in HPI Negative for fever, visual changes, sore throat, rash, new muscle aches, chest pain, SOB, dysuria, bleeding, n/v/abdominal pain.  Past Medical History  Diagnosis Date  . Emphysema of lung ([place])   . Cancer ([place])     breast  . Depression   . Hypertension   . Hyperlipidemia   . Osteopenia   . Nonischemic cardiomyopathy ([place])   . Automatic implantable cardiac defibrillator in situ     Past Surgical History  Procedure Laterality Date  . Breast surgery      mastectomy  . Appendectomy  1943  . Tonsillectomy  1940  . Cardiac defibrillator placement  10/13/2004    Medtronic Maximo VR, model P1342601, serial [id number]  . Pacemaker insertion  06/17/2010    Medtronic Dale VR, model #D334VRG, serial [id number]  . Cardiac catheterization  12/18/2002    Normal coronary arteries and normal LV function  . Femur im nail Right 05/10/2014    Procedure:  INTRAMEDULLARY (IM) NAIL FEMORAL;  Surgeon: Marianna Payment, MD;  Location: [facility];  Service: Orthopedics;  Laterality: Right;     reports that she has been smoking Cigarettes.  She has never used smokeless tobacco. She reports that she drinks about 0.6 oz of alcohol per week. She reports that she does not use illicit drugs.  Allergies  Allergen Reactions  . Atorvastatin     Fatigue   . Claritin [Loratadine] Other (See Comments)    fatigue  . Crestor [Rosuvastatin]     Myalgias. Pt is tolerating Lipitor 20mg  QOD well.    Family History  Problem Relation Age of Onset  . Heart disease Mother     Pacemaker  . Heart disease Father     No details.    . Hyperlipidemia Sister   . Heart disease Brother     "Heart Attacks"  Died age 63  . Cancer Daughter     Oral cancer and "knot" on right cheek that was cancer     Prior to Admission medications   Medication Sig Start Date End Date Taking? Authorizing Provider  ADVAIR DISKUS 100-50 MCG/DOSE AEPB INHALE 1 PUFF INTO THE LUNGS 2 (TWO) TIMES DAILY. 03/18/15  Yes Donnamae Jude, MD  albuterol (PROAIR HFA) 108 (90 BASE) MCG/ACT inhaler Inhale 2 puffs into the lungs every 6 (six) hours as needed for wheezing or shortness of breath.   Yes Historical Provider, MD  aspirin EC 81 MG tablet Take 81 mg by mouth daily.   Yes Historical Provider, MD  atorvastatin (LIPITOR) 40  MG tablet Take 40 mg by mouth daily.   Yes Historical Provider, MD  Calcium Carbonate-Vitamin D (CALTRATE 600+D) 600-400 MG-UNIT per tablet Take 1 tablet by mouth daily.    Yes Historical Provider, MD  carvedilol (COREG) 3.125 MG tablet TAKE 1 TABLET (3.125 MG TOTAL) BY MOUTH 2 (TWO) TIMES DAILY WITH A MEAL. 07/20/15  Yes Mihai Croitoru, MD  enalapril (VASOTEC) 2.5 MG tablet TAKE 2 TABLETS BY MOUTH DAILY 06/21/15  Yes Lorretta Harp, MD  feeding supplement, ENSURE COMPLETE, (ENSURE COMPLETE) LIQD Take 237 mLs by mouth 3 (three) times daily between meals. Patient taking  differently: Take 237 mLs by mouth daily.  05/12/14  Yes Hilton Sinclair, MD  FLUoxetine (PROZAC) 10 MG capsule TAKE ONE CAPSULE BY MOUTH DAILY 10/18/15  Yes Donnamae Jude, MD  furosemide (LASIX) 20 MG tablet TAKE 1 TABLET BY MOUTH DAILY 08/16/15  Yes Mihai Croitoru, MD  Multiple Vitamin (MULTI-VITAMIN DAILY PO) Take 1 tablet by mouth at bedtime.    Yes Historical Provider, MD  traMADol (ULTRAM) 50 MG tablet Take 1 tablet (50 mg total) by mouth every 6 (six) hours as needed. Patient not taking: Reported on 12/28/2015 12/27/15   Jola Schmidt, MD    Physical Exam: Filed Vitals:   12/28/15 1030 12/28/15 1205 12/28/15 1300 12/28/15 1330  BP: 144/79 128/77 154/82 149/76  Pulse: 86 82 85 83  Temp:      TempSrc:      Resp: 22 17 21 15   SpO2: 94% 95% 95% 96%    Constitutional:  . Not in any distress. Appears calm and comfortable Eyes:  . PERRL and irises appear normal  ENMT:  . external ears, nose appear normal . Very dry buccal mucosa. Neck:  . neck appears normal, no masses, normal ROM, supple Respiratory:  . CTA  . Respiratory effort normal. No retractions or accessory muscle use Cardiovascular:  . RRR, no m/r/g . No LE extremity edema   . Normal pedal pulses Abdomen:  . Abdomen appears normal; no tenderness or masses Musculoskeletal:   . Right shoulder is in a sling.  Skin:  No rashes noted Neurologic:  . Awake and alert. Moves all limbs. Psychiatric:  . judgement and insight appear normal . Mental status o Orientation to person, place, time  o Mood, affect appropriate  Wt Readings from Last 3 Encounters:  04/20/15 54.885 kg (121 lb)  12/22/14 55.747 kg (122 lb 14.4 oz)  10/19/14 54.159 kg (119 lb 6.4 oz)    I have personally reviewed following labs and imaging studies  Labs on Admission:  CBC:  Recent Labs Lab 12/28/15 1135  WBC 8.4  NEUTROABS 6.2  HGB 10.3*  HCT 31.4*  MCV 92.9  PLT [id number]   Basic Metabolic Panel:  Recent Labs Lab 12/28/15 1135    NA 137  K 4.0  CL 102  CO2 27  GLUCOSE 100*  BUN 13  CREATININE 0.68  CALCIUM 8.8*   Liver Function Tests:  Recent Labs Lab 12/28/15 1135  AST 23  ALT 12*  ALKPHOS 103  BILITOT 0.8  PROT 7.1  ALBUMIN 3.4*   No results for input(s): LIPASE, AMYLASE in the last 168 hours. No results for input(s): AMMONIA in the last 168 hours. Coagulation Profile: No results for input(s): INR, PROTIME in the last 168 hours. Cardiac Enzymes: No results for input(s): CKTOTAL, CKMB, CKMBINDEX, TROPONINI in the last 168 hours. BNP (last 3 results) No results for input(s): PROBNP in the last 8760 hours.  HbA1C: No results for input(s): HGBA1C in the last 72 hours. CBG: No results for input(s): GLUCAP in the last 168 hours. Lipid Profile: No results for input(s): CHOL, HDL, LDLCALC, TRIG, CHOLHDL, LDLDIRECT in the last 72 hours. Thyroid Function Tests: No results for input(s): TSH, T4TOTAL, FREET4, T3FREE, THYROIDAB in the last 72 hours. Anemia Panel: No results for input(s): VITAMINB12, FOLATE, FERRITIN, TIBC, IRON, RETICCTPCT in the last 72 hours. Urine analysis:    Component Value Date/Time   COLORURINE YELLOW 12/28/2015 [place] 12/28/2015 1042   LABSPEC 1.011 12/28/2015 1042   PHURINE 7.5 12/28/2015 1042   GLUCOSEU NEGATIVE 12/28/2015 1042   HGBUR NEGATIVE 12/28/2015 1042   BILIRUBINUR NEGATIVE 12/28/2015 1042   BILIRUBINUR NEG 10/26/2010 1435   KETONESUR NEGATIVE 12/28/2015 1042   PROTEINUR NEGATIVE 12/28/2015 1042   PROTEINUR NEG 10/26/2010 1435   UROBILINOGEN 0.2 10/26/2010 1435   UROBILINOGEN 0.2 06/15/2010 2135   NITRITE NEGATIVE 12/28/2015 1042   NITRITE NEG 10/26/2010 1435   LEUKOCYTESUR NEGATIVE 12/28/2015 1042   Sepsis Labs: @LABRCNTIP (procalcitonin:4,lacticidven:4) )No results found for this or any previous visit (from the past 240 hour(s)).    Radiological Exams on Admission: Dg Chest 2 View  12/28/2015  CLINICAL DATA:  Pain following fall.   History of breast carcinoma EXAM: CHEST  2 VIEW COMPARISON:  January 07, 2014 FINDINGS: There is patchy infiltrate in the right lung base. Lungs elsewhere are clear. Heart is upper normal in size with pulmonary vascularity within normal limits. There is atherosclerotic calcification in the aorta. Pacemaker leads are attached to the right ventricle. There is a comminuted acute appearing fracture of the proximal right humerus in the metaphysis seal region. The humeral shaft is displaced medially with respect to the humeral head. No gross dislocation seen. IMPRESSION: Acute fracture proximal right humeral metaphysis. Dedicated right shoulder radiographs may be advisable for further assessment. Patchy infiltrate right base. Lungs elsewhere clear. Stable cardiac silhouette. No pneumothorax. Electronically Signed   By: Lowella Grip III M.D.   On: 12/28/2015 11:37   Dg Shoulder Right  12/28/2015  CLINICAL DATA:  [place] today at home and injured right shoulder. EXAM: RIGHT SHOULDER - 2+ VIEW COMPARISON:  None. FINDINGS: Complex comminuted multipart fracture of the humeral head/neck with impaction and displacement. The [facility] joint is intact. IMPRESSION: Complex comminuted multipart fracture of the humeral head/ neck. Electronically Signed   By: Marijo Sanes M.D.   On: 12/28/2015 12:13   Dg Shoulder Right  12/27/2015  CLINICAL DATA:  [place] from bed. EXAM: RIGHT SHOULDER - 2+ VIEW COMPARISON:  12/27/2015 FINDINGS: There is a comminuted fracture involving the surgical neck of humerus as well as the greater tuberosity. No dislocation. Mild medial displacement of the distal fracture fragments. IMPRESSION: 1. Acute fracture involves the surgical neck and greater tuberosity of the proximal humerus. Electronically Signed   By: Kerby Moors M.D.   On: 12/27/2015 11:35   Ct Head Wo Contrast  12/28/2015  CLINICAL DATA:  Pain and dizziness following fall. History of breast carcinoma EXAM: CT HEAD WITHOUT CONTRAST CT CERVICAL SPINE  WITHOUT CONTRAST TECHNIQUE: Multidetector CT imaging of the head and cervical spine was performed following the standard protocol without intravenous contrast. Multiplanar CT image reconstructions of the cervical spine were also generated. COMPARISON:  Cervical spine CT May 09, 2014; head CT Nov 29, 2015 FINDINGS: CT HEAD FINDINGS Moderate diffuse atrophy is stable. There is no intracranial mass, hemorrhage, extra-axial fluid collection, or midline shift. There is small vessel  disease in the centra semiovale bilaterally, stable. No acute infarct evident. The bony calvarium appears intact. The mastoid air cells are clear. There are no intraorbital lesions evident. CT CERVICAL SPINE FINDINGS There is no acute fracture. There is stable slight anterolisthesis of C6 on C7. There is stable mild anterolisthesis of C7 on T1 and T1 on T2. No new spondylolisthesis evident. Prevertebral soft tissues and predental space regions are normal. There is erosive appearing change at the level of the pars interarticularis on the left at C6-7. This area represents a change compared to prior study. This finding is best appreciable on axial slices 49, 50, and 51 series 602 but is also present on coronal slices 21- 26, series 603. Elsewhere there is stable multilevel osteoarthritic change. There is exit foraminal narrowing due to bony hypertrophy on the left at C4-5 and on the left at C5-6. There is calcification in each carotid artery. There is fibrotic type change in each lung apex, also present on prior study. IMPRESSION: CT head: Atrophy with periventricular small vessel disease. No intracranial mass, hemorrhage, or extra-axial fluid collection. No acute infarct evident. CT cervical spine: No fracture. Stable areas of spondylolisthesis as noted above, likely due to underlying spondylosis. Multilevel osteoarthritic change. New apparent erosive change on the left at C6-7. This finding represents a change from prior study. Given  history of breast carcinoma, early lytic lesion in this area must be of concern. This finding may warrant correlation with cervical MR pre and post-contrast to further evaluate for possible neoplastic involvement in this area. Electronically Signed   By: Lowella Grip III M.D.   On: 12/28/2015 11:51   Ct Cervical Spine Wo Contrast  12/28/2015  CLINICAL DATA:  Pain and dizziness following fall. History of breast carcinoma EXAM: CT HEAD WITHOUT CONTRAST CT CERVICAL SPINE WITHOUT CONTRAST TECHNIQUE: Multidetector CT imaging of the head and cervical spine was performed following the standard protocol without intravenous contrast. Multiplanar CT image reconstructions of the cervical spine were also generated. COMPARISON:  Cervical spine CT May 09, 2014; head CT Nov 29, 2015 FINDINGS: CT HEAD FINDINGS Moderate diffuse atrophy is stable. There is no intracranial mass, hemorrhage, extra-axial fluid collection, or midline shift. There is small vessel disease in the centra semiovale bilaterally, stable. No acute infarct evident. The bony calvarium appears intact. The mastoid air cells are clear. There are no intraorbital lesions evident. CT CERVICAL SPINE FINDINGS There is no acute fracture. There is stable slight anterolisthesis of C6 on C7. There is stable mild anterolisthesis of C7 on T1 and T1 on T2. No new spondylolisthesis evident. Prevertebral soft tissues and predental space regions are normal. There is erosive appearing change at the level of the pars interarticularis on the left at C6-7. This area represents a change compared to prior study. This finding is best appreciable on axial slices 49, 50, and 51 series 602 but is also present on coronal slices 21- 26, series 603. Elsewhere there is stable multilevel osteoarthritic change. There is exit foraminal narrowing due to bony hypertrophy on the left at C4-5 and on the left at C5-6. There is calcification in each carotid artery. There is fibrotic type change  in each lung apex, also present on prior study. IMPRESSION: CT head: Atrophy with periventricular small vessel disease. No intracranial mass, hemorrhage, or extra-axial fluid collection. No acute infarct evident. CT cervical spine: No fracture. Stable areas of spondylolisthesis as noted above, likely due to underlying spondylosis. Multilevel osteoarthritic change. New apparent erosive change on the  left at C6-7. This finding represents a change from prior study. Given history of breast carcinoma, early lytic lesion in this area must be of concern. This finding may warrant correlation with cervical MR pre and post-contrast to further evaluate for possible neoplastic involvement in this area. Electronically Signed   By: Lowella Grip III M.D.   On: 12/28/2015 11:51   Dg Humerus Right  12/27/2015  CLINICAL DATA:  Status post fall out of bed. EXAM: RIGHT HUMERUS - 2+ VIEW COMPARISON:  None. FINDINGS: Comminuted fracture of the surgical neck of the right proximal humerus with 2 cm of medial displacement. Comminuted fracture involves the lesser tuberosity with approximately 9 mm of lateral displacement of the greater tuberosity. No other fracture or dislocation. IMPRESSION: Comminuted, displaced fracture of the surgical neck of the right proximal humerus. Electronically Signed   By: Kathreen Devoid   On: 12/27/2015 11:31   Mr Attempted Study-no Report  12/28/2015  This examination belongs to an outside facility and is stored here for comparison purposes only.  Contact the originating outside institution for any associated report or interpretation.   EKG: Independently reviewed.   Active Problems:   Syncope   Assessment/Plan 1. Syncope, possibly orthostatic. 2. Can not rule out vertigo 3. Volume depletion 4. Right lower lobe infiltrate - Worrisome for atelectasis versus pneumonia 5. Recurrent falls  6. Right humeral fracture following a fall. 7. History of right breast cancer S/P Masectomy and  Chemotherapy, with new C6-7 lesion   Admit patient  Volume resuscitation  PT/OT consult  Social worker/case management consult  Trial of Meclizine  Bone scan (Cervical lesion. MRI not feasible due to implantation of Debrillator  Possible placement versus rehab  Management of right humeral fracture as advised by Ortho  DVT prophylaxis: Subcutaneous Lovenox Code Status: DNR Family Communication: Daughter and care giver Disposition Plan: Possibly placement due to recurrent fall versus rehabilitation   Consults called: None   Admission status: Inpatient     Time spent: 60 minutes  Dana Allan, MD  [facility] Pager #: [phone] 7PM-7AM contact night coverage as above  12/28/2015, 1:50 PM

## 2015-12-28 NOTE — ED Notes (Signed)
Per [facility]- FULL CODE. Pt resides at home. Upon answering door. Unwitnessed fall pt felt slightly dizzy. NO LOC. Pt did not use walker with recent clavicle rt side injury ( in sling) from fall. 1cm laceration to left temple bleeding controlled. Per EMS [facility] not performed for clavicle injury for not allow. Towel roll placed for cervical support. Denies neck and back pain. No complaints only slight headache. Pt incontinent with urine present with odor upon arrival to ED.

## 2015-12-28 NOTE — ED Notes (Signed)
Pt in Radiology 

## 2015-12-28 NOTE — ED Notes (Signed)
ADMISSION MD PRESENT SPEAKING WITH DAUGHTER AND CARE GIVER

## 2015-12-28 NOTE — ED Provider Notes (Signed)
CSN: [id number]     Arrival date & time 12/28/15  [id number] History   First MD Initiated Contact with Patient 12/28/15 1016     Chief Complaint  Patient presents with  . Fall  . Head Laceration  . Clavicle Injury    OLD RIGHT SIDE     (Consider location/radiation/quality/duration/timing/severity/associated sxs/prior Treatment) HPI Comments: 82 year old female with history of emphysema, hypertension, hyperlipidemia, nonischemic cardiomyopathy presents following a fall. The patient states that her doorbell rang and she got up quickly to try to go answer the door but when she did she fell and struck her head on the coffee table. She is not sure exactly why she fell but says that she does get lightheaded when she stands up quickly. She denies any shortness of breath or chest pain. No loss of consciousness but says that she woke up on the floor. Denies abdominal or leg pain. The patient recently had a proximal humerus fracture and is still in her arm sling. She said that the person at the door was home health coming to evaluate and help her in her home.  Patient is a 82 y.o. female presenting with fall and scalp laceration.  Fall Pertinent negatives include no chest pain, no abdominal pain, no headaches and no shortness of breath.  Head Laceration Pertinent negatives include no chest pain, no abdominal pain, no headaches and no shortness of breath.    Past Medical History  Diagnosis Date  . Emphysema of lung ([place])   . Cancer ([place])     breast  . Depression   . Hypertension   . Hyperlipidemia   . Osteopenia   . Nonischemic cardiomyopathy ([place])   . Automatic implantable cardiac defibrillator in situ    Past Surgical History  Procedure Laterality Date  . Breast surgery      mastectomy  . Appendectomy  1943  . Tonsillectomy  1940  . Cardiac defibrillator placement  10/13/2004    Medtronic Maximo VR, model [id number], serial [id number]  . Pacemaker insertion  06/17/2010    Medtronic Taylor VR,  model #[id number], serial [id number]  . Cardiac catheterization  12/18/2002    Normal coronary arteries and normal LV function  . Femur im nail Right 05/10/2014    Procedure: INTRAMEDULLARY (IM) NAIL FEMORAL;  Surgeon: Marianna Payment, MD;  Location: [place];  Service: Orthopedics;  Laterality: Right;   Family History  Problem Relation Age of Onset  . Heart disease Mother     Pacemaker  . Heart disease Father     No details.    . Hyperlipidemia Sister   . Heart disease Brother     "Heart Attacks"  Died age 60  . Cancer Daughter     Oral cancer and "knot" on right cheek that was cancer   Social History  Substance Use Topics  . Smoking status: Current Some Day Smoker    Types: Cigarettes  . Smokeless tobacco: Never Used     Comment: Pt reports smoking about 1-2 times a week when stressed.   . Alcohol Use: 0.6 oz/week    1 Glasses of wine per week   OB History    No data available     Review of Systems  Constitutional: Negative for fever, chills and fatigue.  HENT: Negative for congestion, postnasal drip, rhinorrhea and sinus pressure.   Respiratory: Negative for cough, chest tightness, shortness of breath and wheezing.   Cardiovascular: Negative for chest pain and palpitations.  Gastrointestinal: Negative for nausea, vomiting, abdominal  pain and diarrhea.  Genitourinary: Negative for dysuria, urgency and hematuria.  Musculoskeletal: Positive for arthralgias (right shoulder) and neck pain. Negative for back pain.  Skin: Positive for wound. Negative for rash.  Neurological: Positive for light-headedness. Negative for dizziness, seizures, speech difficulty, weakness and headaches.  Hematological: Does not bruise/bleed easily.      Allergies  Atorvastatin; Claritin; and Crestor  Home Medications   Prior to Admission medications   Medication Sig Start Date End Date Taking? Authorizing Provider  ADVAIR DISKUS 100-50 MCG/DOSE AEPB INHALE 1 PUFF INTO THE LUNGS 2 (TWO) TIMES  DAILY. 03/18/15  Yes Donnamae Jude, MD  albuterol (PROAIR HFA) 108 (90 BASE) MCG/ACT inhaler Inhale 2 puffs into the lungs every 6 (six) hours as needed for wheezing or shortness of breath.   Yes Historical Provider, MD  aspirin EC 81 MG tablet Take 81 mg by mouth daily.   Yes Historical Provider, MD  atorvastatin (LIPITOR) 40 MG tablet Take 40 mg by mouth daily.   Yes Historical Provider, MD  Calcium Carbonate-Vitamin D (CALTRATE 600+D) 600-400 MG-UNIT per tablet Take 1 tablet by mouth daily.    Yes Historical Provider, MD  carvedilol (COREG) 3.125 MG tablet TAKE 1 TABLET (3.125 MG TOTAL) BY MOUTH 2 (TWO) TIMES DAILY WITH A MEAL. 07/20/15  Yes Mihai Croitoru, MD  enalapril (VASOTEC) 2.5 MG tablet TAKE 2 TABLETS BY MOUTH DAILY 06/21/15  Yes Lorretta Harp, MD  feeding supplement, ENSURE COMPLETE, (ENSURE COMPLETE) LIQD Take 237 mLs by mouth 3 (three) times daily between meals. Patient taking differently: Take 237 mLs by mouth daily.  05/12/14  Yes Hilton Sinclair, MD  FLUoxetine (PROZAC) 10 MG capsule TAKE ONE CAPSULE BY MOUTH DAILY 10/18/15  Yes Donnamae Jude, MD  furosemide (LASIX) 20 MG tablet TAKE 1 TABLET BY MOUTH DAILY 08/16/15  Yes Mihai Croitoru, MD  Multiple Vitamin (MULTI-VITAMIN DAILY PO) Take 1 tablet by mouth at bedtime.    Yes Historical Provider, MD  traMADol (ULTRAM) 50 MG tablet Take 1 tablet (50 mg total) by mouth every 6 (six) hours as needed. Patient not taking: Reported on 12/28/2015 12/27/15   Jola Schmidt, MD   BP 154/82 mmHg  Pulse 85  Temp(Src) 98.2 F (36.8 C) (Oral)  Resp 21  SpO2 95% Physical Exam  Constitutional: She is oriented to person, place, and time. She appears well-developed and well-nourished. No distress.  HENT:  Head: Normocephalic and atraumatic. Head is without Battle's sign.    Right Ear: External ear normal. No hemotympanum.  Left Ear: External ear normal. No hemotympanum.  Nose: Nose normal. No nasal septal hematoma.  Mouth/Throat: Oropharynx  is clear and moist. No oral lesions. No lacerations. No oropharyngeal exudate.  Eyes: EOM are normal. Pupils are equal, round, and reactive to light.  Neck: Neck supple. Muscular tenderness present. No spinous process tenderness present. Decreased range of motion present.  Cardiovascular: Normal rate, regular rhythm and intact distal pulses.   Pulmonary/Chest: Effort normal. No respiratory distress. She has wheezes (few, scattered, symmetric over the bilateral lungs). She has no rales.  Abdominal: Soft. She exhibits no distension. There is no tenderness.  Musculoskeletal: She exhibits no edema or tenderness.  Neurological: She is alert and oriented to person, place, and time.  Skin: Skin is warm and dry. No rash noted. She is not diaphoretic.  Vitals reviewed.   ED Course  Procedures (including critical care time) Labs Review Labs Reviewed  CBC WITH DIFFERENTIAL/PLATELET - Abnormal; Notable for the following:  RBC 3.38 (*)    Hemoglobin 10.3 (*)    HCT 31.4 (*)    All other components within normal limits  COMPREHENSIVE METABOLIC PANEL - Abnormal; Notable for the following:    Glucose, Bld 100 (*)    Calcium 8.8 (*)    Albumin 3.4 (*)    ALT 12 (*)    All other components within normal limits  URINALYSIS, ROUTINE W REFLEX MICROSCOPIC (NOT AT [facility])  Randolm Idol, ED    Imaging Review Dg Chest 2 View  12/28/2015  CLINICAL DATA:  Pain following fall.  History of breast carcinoma EXAM: CHEST  2 VIEW COMPARISON:  January 07, 2014 FINDINGS: There is patchy infiltrate in the right lung base. Lungs elsewhere are clear. Heart is upper normal in size with pulmonary vascularity within normal limits. There is atherosclerotic calcification in the aorta. Pacemaker leads are attached to the right ventricle. There is a comminuted acute appearing fracture of the proximal right humerus in the metaphysis seal region. The humeral shaft is displaced medially with respect to the humeral head. No gross  dislocation seen. IMPRESSION: Acute fracture proximal right humeral metaphysis. Dedicated right shoulder radiographs may be advisable for further assessment. Patchy infiltrate right base. Lungs elsewhere clear. Stable cardiac silhouette. No pneumothorax. Electronically Signed   By: Lowella Grip III M.D.   On: 12/28/2015 11:37   Dg Shoulder Right  12/28/2015  CLINICAL DATA:  [place] today at home and injured right shoulder. EXAM: RIGHT SHOULDER - 2+ VIEW COMPARISON:  None. FINDINGS: Complex comminuted multipart fracture of the humeral head/neck with impaction and displacement. The [facility] joint is intact. IMPRESSION: Complex comminuted multipart fracture of the humeral head/ neck. Electronically Signed   By: Marijo Sanes M.D.   On: 12/28/2015 12:13   Dg Shoulder Right  12/27/2015  CLINICAL DATA:  [place] from bed. EXAM: RIGHT SHOULDER - 2+ VIEW COMPARISON:  12/27/2015 FINDINGS: There is a comminuted fracture involving the surgical neck of humerus as well as the greater tuberosity. No dislocation. Mild medial displacement of the distal fracture fragments. IMPRESSION: 1. Acute fracture involves the surgical neck and greater tuberosity of the proximal humerus. Electronically Signed   By: Kerby Moors M.D.   On: 12/27/2015 11:35   Ct Head Wo Contrast  12/28/2015  CLINICAL DATA:  Pain and dizziness following fall. History of breast carcinoma EXAM: CT HEAD WITHOUT CONTRAST CT CERVICAL SPINE WITHOUT CONTRAST TECHNIQUE: Multidetector CT imaging of the head and cervical spine was performed following the standard protocol without intravenous contrast. Multiplanar CT image reconstructions of the cervical spine were also generated. COMPARISON:  Cervical spine CT May 09, 2014; head CT Nov 29, 2015 FINDINGS: CT HEAD FINDINGS Moderate diffuse atrophy is stable. There is no intracranial mass, hemorrhage, extra-axial fluid collection, or midline shift. There is small vessel disease in the centra semiovale bilaterally, stable.  No acute infarct evident. The bony calvarium appears intact. The mastoid air cells are clear. There are no intraorbital lesions evident. CT CERVICAL SPINE FINDINGS There is no acute fracture. There is stable slight anterolisthesis of C6 on C7. There is stable mild anterolisthesis of C7 on T1 and T1 on T2. No new spondylolisthesis evident. Prevertebral soft tissues and predental space regions are normal. There is erosive appearing change at the level of the pars interarticularis on the left at C6-7. This area represents a change compared to prior study. This finding is best appreciable on axial slices 49, 50, and 51 series 602 but is also present on  coronal slices 21- 26, series 603. Elsewhere there is stable multilevel osteoarthritic change. There is exit foraminal narrowing due to bony hypertrophy on the left at C4-5 and on the left at C5-6. There is calcification in each carotid artery. There is fibrotic type change in each lung apex, also present on prior study. IMPRESSION: CT head: Atrophy with periventricular small vessel disease. No intracranial mass, hemorrhage, or extra-axial fluid collection. No acute infarct evident. CT cervical spine: No fracture. Stable areas of spondylolisthesis as noted above, likely due to underlying spondylosis. Multilevel osteoarthritic change. New apparent erosive change on the left at C6-7. This finding represents a change from prior study. Given history of breast carcinoma, early lytic lesion in this area must be of concern. This finding may warrant correlation with cervical MR pre and post-contrast to further evaluate for possible neoplastic involvement in this area. Electronically Signed   By: Lowella Grip III M.D.   On: 12/28/2015 11:51   Ct Cervical Spine Wo Contrast  12/28/2015  CLINICAL DATA:  Pain and dizziness following fall. History of breast carcinoma EXAM: CT HEAD WITHOUT CONTRAST CT CERVICAL SPINE WITHOUT CONTRAST TECHNIQUE: Multidetector CT imaging of the head  and cervical spine was performed following the standard protocol without intravenous contrast. Multiplanar CT image reconstructions of the cervical spine were also generated. COMPARISON:  Cervical spine CT May 09, 2014; head CT Nov 29, 2015 FINDINGS: CT HEAD FINDINGS Moderate diffuse atrophy is stable. There is no intracranial mass, hemorrhage, extra-axial fluid collection, or midline shift. There is small vessel disease in the centra semiovale bilaterally, stable. No acute infarct evident. The bony calvarium appears intact. The mastoid air cells are clear. There are no intraorbital lesions evident. CT CERVICAL SPINE FINDINGS There is no acute fracture. There is stable slight anterolisthesis of C6 on C7. There is stable mild anterolisthesis of C7 on T1 and T1 on T2. No new spondylolisthesis evident. Prevertebral soft tissues and predental space regions are normal. There is erosive appearing change at the level of the pars interarticularis on the left at C6-7. This area represents a change compared to prior study. This finding is best appreciable on axial slices 49, 50, and 51 series 602 but is also present on coronal slices 21- 26, series 603. Elsewhere there is stable multilevel osteoarthritic change. There is exit foraminal narrowing due to bony hypertrophy on the left at C4-5 and on the left at C5-6. There is calcification in each carotid artery. There is fibrotic type change in each lung apex, also present on prior study. IMPRESSION: CT head: Atrophy with periventricular small vessel disease. No intracranial mass, hemorrhage, or extra-axial fluid collection. No acute infarct evident. CT cervical spine: No fracture. Stable areas of spondylolisthesis as noted above, likely due to underlying spondylosis. Multilevel osteoarthritic change. New apparent erosive change on the left at C6-7. This finding represents a change from prior study. Given history of breast carcinoma, early lytic lesion in this area must be of  concern. This finding may warrant correlation with cervical MR pre and post-contrast to further evaluate for possible neoplastic involvement in this area. Electronically Signed   By: Lowella Grip III M.D.   On: 12/28/2015 11:51   Dg Humerus Right  12/27/2015  CLINICAL DATA:  Status post fall out of bed. EXAM: RIGHT HUMERUS - 2+ VIEW COMPARISON:  None. FINDINGS: Comminuted fracture of the surgical neck of the right proximal humerus with 2 cm of medial displacement. Comminuted fracture involves the lesser tuberosity with approximately 9 mm of  lateral displacement of the greater tuberosity. No other fracture or dislocation. IMPRESSION: Comminuted, displaced fracture of the surgical neck of the right proximal humerus. Electronically Signed   By: Kathreen Devoid   On: 12/27/2015 11:31   Mr Attempted Study-no Report  12/28/2015  This examination belongs to an outside facility and is stored here for comparison purposes only.  Contact the originating outside institution for any associated report or interpretation.  I have personally reviewed and evaluated these images and lab results as part of my medical decision-making.   EKG Interpretation   Date/Time:  Tuesday December 28 2015 10:21:25 EDT Ventricular Rate:  81 PR Interval:  112 QRS Duration: 128 QT Interval:  390 QTC Calculation: 453 R Axis:   8 Text Interpretation:  Sinus rhythm Borderline short PR interval Left  bundle branch block More pronounced block compared to previous EKG  Confirmed by NGUYEN, EMILY ([id number]) on 12/28/2015 10:25:49 AM      MDM  Patient seen and evaluated at bedside.  Patient in no acute distress but with tachypnea and wheezing on examination.  Breathing treatment ordered.  Chest xray concerning for pneumonia and patient started on Rocephin and azithromycin.  Xrays also noted acute proximal humerus fracture - this was discussed with Dr. Wynelle Link who recommended continuing the arm sling but had no other acute recommendations at  this time.  Patient UTD with tetanus vaccine.  Wound cleaned and dressing applied - no need for closure at this time.  CT of the cervical spine concerning for erosive changes and recurrence of breast CA but patietn unable to get MRI - discussed with patient and family and caregiver that this will need to be followed up outpatient with oncology.  Patient neurovascularly intact and otherwise cervical spine cleared and so cervical collar was removed.  Discussed with Dr. Marthenia Rolling from [facility] who agreed with admission and patient admitted on telemetry. Final diagnoses:  Syncope, unspecified syncope type  CAP (community acquired pneumonia)    1. Syncope  2. CAP  3. Right proximal humerus fracture    Harvel Quale, MD 12/28/15 1329

## 2015-12-28 NOTE — ED Notes (Signed)
APPLIED BY MACON T RN -CERVICAL COLLAR. PT TOLERATED

## 2015-12-28 NOTE — ED Notes (Signed)
NM CALLED . WILL BE TOMORROW. WILL HAVE TO DOSE PT AND THEN BE 3 HOURS AFTER DOSING. AWARE OF PT'S ADMISSION AND ROOM ASSIGNMENT

## 2015-12-28 NOTE — ED Notes (Signed)
Patient transported to CT 

## 2015-12-29 ENCOUNTER — Inpatient Hospital Stay (HOSPITAL_COMMUNITY): Payer: Medicare Other

## 2015-12-29 ENCOUNTER — Encounter: Payer: Self-pay | Admitting: Cardiovascular Disease

## 2015-12-29 ENCOUNTER — Telehealth: Payer: Self-pay | Admitting: Cardiovascular Disease

## 2015-12-29 DIAGNOSIS — R55 Syncope and collapse: Secondary | ICD-10-CM

## 2015-12-29 DIAGNOSIS — S42301A Unspecified fracture of shaft of humerus, right arm, initial encounter for closed fracture: Secondary | ICD-10-CM

## 2015-12-29 DIAGNOSIS — J439 Emphysema, unspecified: Secondary | ICD-10-CM

## 2015-12-29 DIAGNOSIS — I10 Essential (primary) hypertension: Secondary | ICD-10-CM

## 2015-12-29 LAB — CBC WITH DIFFERENTIAL/PLATELET
Basophils Absolute: 0 10*3/uL (ref 0.0–0.1)
Basophils Relative: 0 %
Eosinophils Absolute: 0.2 10*3/uL (ref 0.0–0.7)
Eosinophils Relative: 3 %
HCT: 29.3 % — ABNORMAL LOW (ref 36.0–46.0)
Hemoglobin: 9.7 g/dL — ABNORMAL LOW (ref 12.0–15.0)
Lymphocytes Relative: 29 %
Lymphs Abs: 2.1 10*3/uL (ref 0.7–4.0)
MCH: 30.7 pg (ref 26.0–34.0)
MCHC: 33.1 g/dL (ref 30.0–36.0)
MCV: 92.7 fL (ref 78.0–100.0)
Monocytes Absolute: 0.7 10*3/uL (ref 0.1–1.0)
Monocytes Relative: 9 %
Neutro Abs: 4.4 10*3/uL (ref 1.7–7.7)
Neutrophils Relative %: 59 %
Platelets: 300 10*3/uL (ref 150–400)
RBC: 3.16 MIL/uL — ABNORMAL LOW (ref 3.87–5.11)
RDW: 13.5 % (ref 11.5–15.5)
WBC: 7.4 10*3/uL (ref 4.0–10.5)

## 2015-12-29 LAB — ECHOCARDIOGRAM COMPLETE
E decel time: 219 msec
E/e' ratio: 10.05
FS: 10 % — AB (ref 28–44)
Height: 64 in
IVS/LV PW RATIO, ED: 0.66
LA ID, A-P, ES: 32 mm
LA diam end sys: 32 mm
LA diam index: 1.95 cm/m2
LA vol A4C: 30 ml
LA vol index: 33.8 mL/m2
LA vol: 55.4 mL
LV E/e' medial: 10.05
LV E/e'average: 10.05
LV PW d: 17.6 mm — AB (ref 0.6–1.1)
LV e' LATERAL: 5.98 cm/s
MV Dec: 219
MV pk A vel: 74.2 m/s
MV pk E vel: 60.1 m/s
TAPSE: 21.7 mm
TDI e' lateral: 5.98
TDI e' medial: 4.79
Weight: 2109.36 oz

## 2015-12-29 LAB — URINALYSIS, ROUTINE W REFLEX MICROSCOPIC
Bilirubin Urine: NEGATIVE
Glucose, UA: NEGATIVE mg/dL
Hgb urine dipstick: NEGATIVE
Ketones, ur: NEGATIVE mg/dL
Leukocytes, UA: NEGATIVE
Nitrite: NEGATIVE
Protein, ur: NEGATIVE mg/dL
Specific Gravity, Urine: 1.015 (ref 1.005–1.030)
pH: 7 (ref 5.0–8.0)

## 2015-12-29 LAB — COMPREHENSIVE METABOLIC PANEL
ALT: 11 U/L — ABNORMAL LOW (ref 14–54)
AST: 22 U/L (ref 15–41)
Albumin: 3.2 g/dL — ABNORMAL LOW (ref 3.5–5.0)
Alkaline Phosphatase: 92 U/L (ref 38–126)
Anion gap: 6 (ref 5–15)
BUN: 15 mg/dL (ref 6–20)
CO2: 27 mmol/L (ref 22–32)
Calcium: 8.5 mg/dL — ABNORMAL LOW (ref 8.9–10.3)
Chloride: 102 mmol/L (ref 101–111)
Creatinine, Ser: 0.76 mg/dL (ref 0.44–1.00)
GFR calc Af Amer: >60 mL/min (ref >60)
GFR calc non Af Amer: >60 mL/min (ref >60)
Glucose, Bld: 97 mg/dL (ref 65–99)
Potassium: 4.1 mmol/L (ref 3.5–5.1)
Sodium: 135 mmol/L (ref 135–145)
Total Bilirubin: 0.8 mg/dL (ref 0.3–1.2)
Total Protein: 6.6 g/dL (ref 6.5–8.1)

## 2015-12-29 LAB — TROPONIN I: Troponin I: <0.03 ng/mL (ref <0.031)

## 2015-12-29 MED ORDER — TECHNETIUM TC 99M MEDRONATE IV KIT
25.0000 | PACK | Freq: Once | INTRAVENOUS | Status: AC | PRN
  Administered 2015-12-29: 26.4 via INTRAVENOUS

## 2015-12-29 NOTE — Progress Notes (Signed)
Occupational Therapy Evaluation Patient Details Name: Brianna [name] MRN: [id number] DOB: [birth year] Today's Date: 12/29/2015    History of Present Illness 82 year old female with history of recurrent falls, right breast cancer status post mastectomy and chemotherapy, recurrent fall with history of recent fall that resulted in communicated right humeral fracture about a week ago, HTN and emphysema. Patient presents following a syncopal episode that occurred when patient tried to stand from a sitting position.   Clinical Impression   Patient presents to OT with decreased ADL independence and safety due to the deficits listed below. She will benefit from skilled OT to maximize function and to facilitate a safe discharge. OT will follow.  Patient prefers to d/c home, but reports she has been to [facility] in the past. She does not have 24/7 A at home. Will see how she progresses and update recommendations as needed.    Follow Up Recommendations  Supervision/Assistance - 24 hour;Home health OT    Equipment Recommendations  None recommended by OT    Recommendations for Other Services PT consult     Precautions / Restrictions Precautions Precautions: Fall Precaution Comments: gets dizzy when stands up too fast Required Braces or Orthoses: Sling Restrictions Weight Bearing Restrictions: Yes Other Position/Activity Restrictions: likely NWB RUE due to fx      Mobility Bed Mobility               General bed mobility comments: NT -- up in recliner  Transfers                 General transfer comment: NT -- pt declined     Balance                                            ADL Overall ADL's : Needs assistance/impaired Eating/Feeding: Set up;Sitting   Grooming: Wash/dry face;Set up;Sitting   Upper Body Bathing: Maximal assistance;Sitting   Lower Body Bathing: Maximal assistance;Sit to/from stand   Upper Body Dressing : Maximal  assistance;Sitting   Lower Body Dressing: Maximal assistance;Sit to/from stand                 General ADL Comments: Patient received up in recliner. Reports she just got up with nursing to the bathroom and then the chair. Did not wish to get up with OT during evaluation. Patient reports private caregiver does all of her bathing/dressing and she can toilet herself. She has all needed DME at home per her report. She reports that the nursing staff walked with her to the bathroom today but she did not need much help. Will follow up next session regarding functional mobility/ADL transfers. OT will follow.     Vision     Perception     Praxis      Pertinent Vitals/Pain Pain Assessment: 0-10 Pain Score: 5  Pain Location: R shoulder Pain Descriptors / Indicators: Aching;Sore Pain Intervention(s): Limited activity within patient's tolerance;Monitored during session     Hand Dominance Left   Extremity/Trunk Assessment Upper Extremity Assessment Upper Extremity Assessment: RUE deficits/detail;LUE deficits/detail RUE Deficits / Details: RUE in sling; able to move wrist and digits without difficulty. Otherwise NT. LUE Deficits / Details: LUE AROM/strength WFL   Lower Extremity Assessment Lower Extremity Assessment: Defer to PT evaluation       Communication Communication Communication: No difficulties   Cognition Arousal/Alertness: Awake/alert  Behavior During Therapy: WFL for tasks assessed/performed Overall Cognitive Status: Within Functional Limits for tasks assessed                     General Comments       Exercises       Shoulder Instructions      Home Living Family/patient expects to be discharged to:: Unsure Living Arrangements: Alone Available Help at Discharge: Personal care attendant;Available PRN/intermittently (caregiver there 9am-9pm) Type of Home: House Home Access: Stairs to enter [employer] of Steps: 3   Home Layout: One  level     Bathroom Shower/Tub: Teacher, Yolanda Dockendorf years/pre: Standard     Home Equipment: Tub bench;Grab bars - tub/shower;Bedside commode;Cane - single point;Walker - 2 wheels          Prior Functioning/Environment Level of Independence: Needs assistance  Gait / Transfers Assistance Needed: uses cane since broke arm; normally uses walker ADL's / Homemaking Assistance Needed: private caregiver assists with bathing, dressing, cooking, housework, laundry        OT Diagnosis: Acute pain;Generalized weakness   OT Problem List: Decreased strength;Decreased range of motion;Decreased activity tolerance;Impaired balance (sitting and/or standing);Pain   OT Treatment/Interventions: Self-care/ADL training;Therapeutic exercise;DME and/or AE instruction;Therapeutic activities;Patient/family education    OT Goals(Current goals can be found in the care plan section) Acute Rehab OT Goals Patient Stated Goal: to go home OT Goal Formulation: With patient Time For Goal Achievement: 01/12/16 Potential to Achieve Goals: Good  OT Frequency: Min 2X/week   Barriers to D/C: Decreased caregiver support  caregiver only from 9am-9pm otherwise she is alone       Co-evaluation              End of Session Equipment Utilized During Treatment: Other (comment) (sling)  Activity Tolerance: Patient tolerated treatment well Patient left: in chair;with call bell/phone within reach;with chair alarm set   Time: [id number] OT Time Calculation (min): 16 min Charges:  OT General Charges $OT Visit: 1 Procedure OT Evaluation $OT Eval Moderate Complexity: 1 Procedure G-Codes:    Corby Vandenberghe A 23-Jan-2016, 9:24 AM

## 2015-12-29 NOTE — Evaluation (Signed)
Physical Therapy Evaluation Patient Details Name: Brianna [name] MRN: [id number] DOB: Nov 28, 1929 Today's Date: 12/29/2015   History of Present Illness  82 year old female with history of recurrent falls, right breast cancer status post mastectomy and chemotherapy, pacemaker, history of recent fall that resulted in communicated right humeral fracture about a week ago, HTN and emphysema. Patient presents following a syncopal episode that occurred when patient tried to stand from a sitting position.  Clinical Impression  Pt admitted with above diagnosis. Pt currently with functional limitations due to the deficits listed below (see PT Problem List).  Pt will benefit from skilled PT to increase their independence and safety with mobility to allow discharge to the venue listed below.  Pt overall min assist for mobility at this time and will need 24/7 assist upon d/c for safety.       Follow Up Recommendations Supervision/Assistance - 24 hour;Home health PT (if 24/7 is not available, pt would benefit from SNF)    Equipment Recommendations  None recommended by PT    Recommendations for Other Services       Precautions / Restrictions Precautions Precautions: Fall Precaution Comments: dizziness Required Braces or Orthoses: Sling Restrictions Weight Bearing Restrictions: Yes Other Position/Activity Restrictions: likely NWB RUE due to fx      Mobility  Bed Mobility               General bed mobility comments: pt up in recliner on arrival  Transfers Overall transfer level: Needs assistance Equipment used: Rolling walker (2 wheeled) Transfers: Sit to/from [facility] Sit to Stand: Min assist Stand pivot transfers: Min assist       General transfer comment: verbal cues for hand placement and assist for rise and steadying, assisted to/from [facility], pt able to perform hygiene however required seated rest break prior to ambulation (RW for L UE, R UE in sling and  remained NWB)  Ambulation/Gait Ambulation/Gait assistance: Min assist Ambulation Distance (Feet): 25 Feet Assistive device: Rolling walker (2 wheeled) (with only L UE) Gait Pattern/deviations: Step-through pattern;Decreased stride length     General Gait Details: min assist for occasional steadying, pt has been using SPC, distance limited by dizziness and fatigue, BP 141/78 mmHg upon sitting in reclliner, SpO2 99%, 77 bpm  Stairs            Wheelchair Mobility    Modified Rankin (Stroke Patients Only)       Balance                                             Pertinent Vitals/Pain Pain Assessment: 0-10 Pain Score: 5  Pain Location: R shoulder Pain Descriptors / Indicators: Sore;Aching Pain Intervention(s): Limited activity within patient's tolerance;Repositioned;Monitored during session    Home Living Family/patient expects to be discharged to:: Private residence Living Arrangements: Alone Available Help at Discharge: Personal care attendant;Available PRN/intermittently (caregiver there 9am-9pm) Type of Home: House Home Access: Stairs to enter   [employer] of Steps: 3 Home Layout: One level Home Equipment: Tub bench;Grab bars - tub/shower;Bedside commode;Cane - single point;Walker - 2 wheels      Prior Function Level of Independence: Needs assistance   Gait / Transfers Assistance Needed: uses cane since humer normally uses walker  ADL's / Homemaking Assistance Needed: private caregiver assists with bathing, dressing, cooking, housework, Estate agent  Dominance   Dominant Hand: Left    Extremity/Trunk Assessment               Lower Extremity Assessment: Generalized weakness         Communication   Communication: No difficulties  Cognition Arousal/Alertness: Awake/alert Behavior During Therapy: WFL for tasks assessed/performed Overall Cognitive Status: Within Functional Limits for tasks assessed                       General Comments      Exercises        Assessment/Plan    PT Assessment Patient needs continued PT services  PT Diagnosis Difficulty walking;Acute pain   PT Problem List Decreased strength;Decreased activity tolerance;Decreased mobility;Pain;Decreased knowledge of use of DME;Decreased balance  PT Treatment Interventions DME instruction;Gait training;Therapeutic activities;Functional mobility training;Patient/family education;Therapeutic exercise   PT Goals (Current goals can be found in the Care Plan section) Acute Rehab PT Goals PT Goal Formulation: With patient Time For Goal Achievement: 01/12/16 Potential to Achieve Goals: Good    Frequency Min 3X/week   Barriers to discharge        Co-evaluation               End of Session Equipment Utilized During Treatment: Gait belt Activity Tolerance: Patient tolerated treatment well Patient left: in chair;with call bell/phone within reach;with chair alarm set;with family/visitor present           Time: [id number] PT Time Calculation (min) (ACUTE ONLY): 20 min   Charges:   PT Evaluation $PT Eval Moderate Complexity: 1 Procedure     PT G Codes:        Kambri Dismore,KATHrine E 12/29/2015, 12:59 PM Carmelia Bake, PT, DPT 12/29/2015 Pager: [phone]

## 2015-12-29 NOTE — Telephone Encounter (Signed)
Closed encounter °

## 2015-12-29 NOTE — Progress Notes (Signed)
*  PRELIMINARY RESULTS* Echocardiogram 2D Echocardiogram has been performed.  Leavy Cella 12/29/2015, 4:45 PM

## 2015-12-29 NOTE — Progress Notes (Signed)
PROGRESS NOTE  Brianna [name] [id number] DOB: [birth year] DOA: 12/28/2015 PCP: Donnamae Jude, MD  HPI/Recap of past 59 hours: 82 year old female with history of recurrent falls, right breast cancer status post mastectomy and chemotherapy, recurrent fall with history of recent fall that resulted in communicated right humeral fracture about a week ago, HTN and emphysema who was admitted after syncopal episode.  She is currently quite comfortalbe, but says that she did get a little lightheaded when she got out of bed with RN this AM. Denies any complaints at this moment.   Assessment/Plan: Active Problems:   HYPERCHOLESTEROLEMIA - cont home meds   HYPERTENSION, BENIGN SYSTEMIC - BP is actually elevated but given dizziness will not increase. Cont home ACE here, and watch BP closely.   BREAST CANCER, HX OF - 15 years ago, has been compliant with surveillance. She now has concerning finding of new C6-C7 lesion, cannot get MRI due to pacemaker. Bone scan has been ordered.   COPD (chronic obstructive pulmonary disease) (HCC) - no s/s of exacerbation CAP - changes on CXR c/w PNA, treating with empiric IV abx, may also be a cause of her volume depletion   Syncope - poss due to volume depletion, this has been an ongoing problem for her. Continue IV hydration and check orthostatics this AM. PT/OT consultation, she may need rehab or at least [facility] therapy services at discharge.    Fracture of right humerus - already splinted, ortho has been consulted. Anticipate that this is not going to require surgical repair   Code Status: DNR   Family Communication: None present   Disposition Plan: SNF vs [place]    Consultants:  Ortho   Procedures:  None   Antimicrobials:  Azithromycin and Rocephin     Objective: Filed Vitals:   12/28/15 1959 12/28/15 2132 12/29/15 0355 12/29/15 0814  BP:  103/63 145/84   Pulse:  87 91 77  Temp:  98.3 F (36.8 C) 97.4 F (36.3 C)   TempSrc:  Oral Oral   Resp:   20 18 16   Height:      Weight:      SpO2: 98% 97% 94% 95%    Intake/Output Summary (Last 24 hours) at 12/29/15 0943 Last data filed at 12/29/15 0700  Gross per 24 hour  Intake   1455 ml  Output    100 ml  Net   1355 ml   Filed Weights   12/28/15 1442  Weight: 59.8 kg (131 lb 13.4 oz)    Exam: General:  Alert, oriented, calm, in no acute distress Eyes: pupils round and reactive to light and accomodation, clear sclerea Neck: supple, no masses, trachea mildline  Cardiovascular: RRR, no murmurs or rubs, no peripheral edema  Respiratory: clear to auscultation bilaterally, no wheezes, no crackles  Abdomen: soft, nontender, nondistended, normal bowel tones heard  Skin: dry, no rashes  Musculoskeletal: no joint effusions, normal range of motion except in R arm which is in sling Psychiatric: appropriate affect, normal speech  Neurologic: extraocular muscles intact, clear speech, moving all extremities with intact sensorium    Data Reviewed: CBC:  Recent Labs Lab 12/28/15 1135 12/28/15 1437 12/29/15 0145  WBC 8.4 8.9 7.4  NEUTROABS 6.2  --  4.4  HGB 10.3* 10.8* 9.7*  HCT 31.4* 33.0* 29.3*  MCV 92.9 92.7 92.7  PLT 334 340 [id number]   Basic Metabolic Panel:  Recent Labs Lab 12/28/15 1135 12/28/15 1437 12/29/15 0145  NA 137  --  135  K 4.0  --  4.1  CL 102  --  102  CO2 27  --  27  GLUCOSE 100*  --  97  BUN 13  --  15  CREATININE 0.68 0.70 0.76  CALCIUM 8.8*  --  8.5*   GFR: Estimated Creatinine Clearance: 46 mL/min (by C-G formula based on Cr of 0.76). Liver Function Tests:  Recent Labs Lab 12/28/15 1135 12/29/15 0145  AST 23 22  ALT 12* 11*  ALKPHOS 103 92  BILITOT 0.8 0.8  PROT 7.1 6.6  ALBUMIN 3.4* 3.2*   No results for input(s): LIPASE, AMYLASE in the last 168 hours. No results for input(s): AMMONIA in the last 168 hours. Coagulation Profile: No results for input(s): INR, PROTIME in the last 168 hours. Cardiac Enzymes:  Recent Labs Lab  12/28/15 1437 12/28/15 2045 12/29/15 0145  TROPONINI 0.03 <0.03 <0.03   BNP (last 3 results) No results for input(s): PROBNP in the last 8760 hours. HbA1C: No results for input(s): HGBA1C in the last 72 hours. CBG: No results for input(s): GLUCAP in the last 168 hours. Lipid Profile: No results for input(s): CHOL, HDL, LDLCALC, TRIG, CHOLHDL, LDLDIRECT in the last 72 hours. Thyroid Function Tests: No results for input(s): TSH, T4TOTAL, FREET4, T3FREE, THYROIDAB in the last 72 hours. Anemia Panel: No results for input(s): VITAMINB12, FOLATE, FERRITIN, TIBC, IRON, RETICCTPCT in the last 72 hours. Urine analysis:    Component Value Date/Time   COLORURINE YELLOW 12/29/2015 0135   APPEARANCEUR CLEAR 12/29/2015 0135   LABSPEC 1.015 12/29/2015 0135   PHURINE 7.0 12/29/2015 0135   GLUCOSEU NEGATIVE 12/29/2015 0135   HGBUR NEGATIVE 12/29/2015 0135   BILIRUBINUR NEGATIVE 12/29/2015 0135   BILIRUBINUR NEG 10/26/2010 1435   KETONESUR NEGATIVE 12/29/2015 0135   PROTEINUR NEGATIVE 12/29/2015 0135   PROTEINUR NEG 10/26/2010 1435   UROBILINOGEN 0.2 10/26/2010 1435   UROBILINOGEN 0.2 06/15/2010 2135   NITRITE NEGATIVE 12/29/2015 0135   NITRITE NEG 10/26/2010 1435   LEUKOCYTESUR NEGATIVE 12/29/2015 0135   Sepsis Labs: @LABRCNTIP (procalcitonin:4,lacticidven:4)  )No results found for this or any previous visit (from the past 240 hour(s)).    Studies: Dg Chest 2 View  12/28/2015  CLINICAL DATA:  Pain following fall.  History of breast carcinoma EXAM: CHEST  2 VIEW COMPARISON:  January 07, 2014 FINDINGS: There is patchy infiltrate in the right lung base. Lungs elsewhere are clear. Heart is upper normal in size with pulmonary vascularity within normal limits. There is atherosclerotic calcification in the aorta. Pacemaker leads are attached to the right ventricle. There is a comminuted acute appearing fracture of the proximal right humerus in the metaphysis seal region. The humeral shaft is  displaced medially with respect to the humeral head. No gross dislocation seen. IMPRESSION: Acute fracture proximal right humeral metaphysis. Dedicated right shoulder radiographs may be advisable for further assessment. Patchy infiltrate right base. Lungs elsewhere clear. Stable cardiac silhouette. No pneumothorax. Electronically Signed   By: Lowella Grip III M.D.   On: 12/28/2015 11:37   Dg Shoulder Right  12/28/2015  CLINICAL DATA:  [place] today at home and injured right shoulder. EXAM: RIGHT SHOULDER - 2+ VIEW COMPARISON:  None. FINDINGS: Complex comminuted multipart fracture of the humeral head/neck with impaction and displacement. The [facility] joint is intact. IMPRESSION: Complex comminuted multipart fracture of the humeral head/ neck. Electronically Signed   By: Marijo Sanes M.D.   On: 12/28/2015 12:13   Ct Head Wo Contrast  12/28/2015  CLINICAL DATA:  Pain and dizziness  following fall. History of breast carcinoma EXAM: CT HEAD WITHOUT CONTRAST CT CERVICAL SPINE WITHOUT CONTRAST TECHNIQUE: Multidetector CT imaging of the head and cervical spine was performed following the standard protocol without intravenous contrast. Multiplanar CT image reconstructions of the cervical spine were also generated. COMPARISON:  Cervical spine CT May 09, 2014; head CT Nov 29, 2015 FINDINGS: CT HEAD FINDINGS Moderate diffuse atrophy is stable. There is no intracranial mass, hemorrhage, extra-axial fluid collection, or midline shift. There is small vessel disease in the centra semiovale bilaterally, stable. No acute infarct evident. The bony calvarium appears intact. The mastoid air cells are clear. There are no intraorbital lesions evident. CT CERVICAL SPINE FINDINGS There is no acute fracture. There is stable slight anterolisthesis of C6 on C7. There is stable mild anterolisthesis of C7 on T1 and T1 on T2. No new spondylolisthesis evident. Prevertebral soft tissues and predental space regions are normal. There is erosive  appearing change at the level of the pars interarticularis on the left at C6-7. This area represents a change compared to prior study. This finding is best appreciable on axial slices 49, 50, and 51 series 602 but is also present on coronal slices 21- 26, series 603. Elsewhere there is stable multilevel osteoarthritic change. There is exit foraminal narrowing due to bony hypertrophy on the left at C4-5 and on the left at C5-6. There is calcification in each carotid artery. There is fibrotic type change in each lung apex, also present on prior study. IMPRESSION: CT head: Atrophy with periventricular small vessel disease. No intracranial mass, hemorrhage, or extra-axial fluid collection. No acute infarct evident. CT cervical spine: No fracture. Stable areas of spondylolisthesis as noted above, likely due to underlying spondylosis. Multilevel osteoarthritic change. New apparent erosive change on the left at C6-7. This finding represents a change from prior study. Given history of breast carcinoma, early lytic lesion in this area must be of concern. This finding may warrant correlation with cervical MR pre and post-contrast to further evaluate for possible neoplastic involvement in this area. Electronically Signed   By: Lowella Grip III M.D.   On: 12/28/2015 11:51   Ct Cervical Spine Wo Contrast  12/28/2015  CLINICAL DATA:  Pain and dizziness following fall. History of breast carcinoma EXAM: CT HEAD WITHOUT CONTRAST CT CERVICAL SPINE WITHOUT CONTRAST TECHNIQUE: Multidetector CT imaging of the head and cervical spine was performed following the standard protocol without intravenous contrast. Multiplanar CT image reconstructions of the cervical spine were also generated. COMPARISON:  Cervical spine CT May 09, 2014; head CT Nov 29, 2015 FINDINGS: CT HEAD FINDINGS Moderate diffuse atrophy is stable. There is no intracranial mass, hemorrhage, extra-axial fluid collection, or midline shift. There is small vessel  disease in the centra semiovale bilaterally, stable. No acute infarct evident. The bony calvarium appears intact. The mastoid air cells are clear. There are no intraorbital lesions evident. CT CERVICAL SPINE FINDINGS There is no acute fracture. There is stable slight anterolisthesis of C6 on C7. There is stable mild anterolisthesis of C7 on T1 and T1 on T2. No new spondylolisthesis evident. Prevertebral soft tissues and predental space regions are normal. There is erosive appearing change at the level of the pars interarticularis on the left at C6-7. This area represents a change compared to prior study. This finding is best appreciable on axial slices 49, 50, and 51 series 602 but is also present on coronal slices 21- 26, series 603. Elsewhere there is stable multilevel osteoarthritic change. There is exit foraminal  narrowing due to bony hypertrophy on the left at C4-5 and on the left at C5-6. There is calcification in each carotid artery. There is fibrotic type change in each lung apex, also present on prior study. IMPRESSION: CT head: Atrophy with periventricular small vessel disease. No intracranial mass, hemorrhage, or extra-axial fluid collection. No acute infarct evident. CT cervical spine: No fracture. Stable areas of spondylolisthesis as noted above, likely due to underlying spondylosis. Multilevel osteoarthritic change. New apparent erosive change on the left at C6-7. This finding represents a change from prior study. Given history of breast carcinoma, early lytic lesion in this area must be of concern. This finding may warrant correlation with cervical MR pre and post-contrast to further evaluate for possible neoplastic involvement in this area. Electronically Signed   By: Lowella Grip III M.D.   On: 12/28/2015 11:51   Mr Attempted Daymon Larsen Report  12/28/2015  This examination belongs to an outside facility and is stored here for comparison purposes only.  Contact the originating outside  institution for any associated report or interpretation.   Scheduled Meds: . aspirin EC  81 mg Oral Daily  . atorvastatin  40 mg Oral Daily  . azithromycin  500 mg Intravenous Q24H  . calcium-vitamin D  1 tablet Oral Daily  . carvedilol  3.125 mg Oral BID WC  . cefTRIAXone (ROCEPHIN)  IV  1 g Intravenous Q24H  . enalapril  5 mg Oral Daily  . enoxaparin (LOVENOX) injection  40 mg Subcutaneous Q24H  . feeding supplement (ENSURE ENLIVE)  237 mL Oral Daily  . FLUoxetine  10 mg Oral Daily  . ipratropium-albuterol  3 mL Nebulization TID  . meclizine  12.5 mg Oral TID  . mometasone-formoterol  2 puff Inhalation BID  . multivitamin with minerals   Oral QHS    Continuous Infusions: . sodium chloride 75 mL/hr at 12/29/15 0659     LOS: 1 day   Time spent: 21 mins  Mir Marry Guan, MD [facility] Pager [phone]  If 7PM-7AM, please contact night-coverage [email or web address] Password [facility] 12/29/2015, 9:43 AM

## 2015-12-30 ENCOUNTER — Other Ambulatory Visit (HOSPITAL_COMMUNITY): Payer: Federal, State, Local not specified - PPO

## 2015-12-30 DIAGNOSIS — Z853 Personal history of malignant neoplasm of breast: Secondary | ICD-10-CM

## 2015-12-30 LAB — CBC
HEMATOCRIT: 28.9 % — AB (ref 36.0–46.0)
HEMOGLOBIN: 9.8 g/dL — AB (ref 12.0–15.0)
MCH: 30.5 pg (ref 26.0–34.0)
MCHC: 33.9 g/dL (ref 30.0–36.0)
MCV: 90 fL (ref 78.0–100.0)
Platelets: 274 10*3/uL (ref 150–400)
RBC: 3.21 MIL/uL — ABNORMAL LOW (ref 3.87–5.11)
RDW: 13.5 % (ref 11.5–15.5)
WBC: 7.5 10*3/uL (ref 4.0–10.5)

## 2015-12-30 LAB — BASIC METABOLIC PANEL
ANION GAP: 7 (ref 5–15)
BUN: 15 mg/dL (ref 6–20)
CALCIUM: 8.4 mg/dL — AB (ref 8.9–10.3)
CO2: 23 mmol/L (ref 22–32)
Chloride: 106 mmol/L (ref 101–111)
Creatinine, Ser: 0.89 mg/dL (ref 0.44–1.00)
GFR calc Af Amer: >60 mL/min (ref >60)
GFR, EST NON AFRICAN AMERICAN: 58 mL/min — AB (ref >60)
GLUCOSE: 92 mg/dL (ref 65–99)
POTASSIUM: 4.3 mmol/L (ref 3.5–5.1)
SODIUM: 136 mmol/L (ref 135–145)

## 2015-12-30 MED ORDER — AZITHROMYCIN 250 MG PO TABS: ORAL_TABLET | ORAL | Status: DC

## 2015-12-30 MED ORDER — MECLIZINE HCL 12.5 MG PO TABS: 12.5000 mg | ORAL_TABLET | Freq: Three times a day (TID) | ORAL | Status: DC

## 2015-12-30 MED ORDER — CEFUROXIME AXETIL 500 MG PO TABS: 500.0000 mg | ORAL_TABLET | Freq: Two times a day (BID) | ORAL | Status: DC

## 2015-12-30 MED ORDER — AZITHROMYCIN 250 MG PO TABS
500.0000 mg | ORAL_TABLET | Freq: Every day | ORAL | Status: AC
  Administered 2015-12-30: 500 mg via ORAL
  Filled 2015-12-30: qty 2

## 2015-12-30 NOTE — Progress Notes (Signed)
Patient and family member given discharge, follow up, and medication instructions, verbalized understanding, IV and telemetry box removed, pt home address confirmed, PTAR contacted to transport home

## 2015-12-30 NOTE — Care Management Note (Signed)
Case Management Note  Patient Details  Name: Brianna [name] MRN: [id number] Date of Birth: June 20, 1930  Subjective/Objective:  [facility] aware of d/c & HHC orders. PTAR transp-Nsg to call, DNR form on chart for MD to sign. MD notified.Nsg aware.                  Action/Plan:d/c home w/HHC/PTAR   Expected Discharge Date:   (unknown)               Expected Discharge Plan:  [place]  In-House Referral:     Discharge planning Services  CM Consult  Post Acute Care Choice:    Choice offered to:  Patient  DME Arranged:    DME Agency:     HH Arranged:  RN, PT, Social Work [employer] Agency:  [place]  Status of Service:  Completed, signed off  Medicare Important Message Given:    Date Medicare IM Given:    Medicare IM give by:    Date Additional Medicare IM Given:    Additional Medicare Important Message give by:     If discussed at [place] of Stay Meetings, dates discussed:    Additional Comments:  Dessa Phi, RN 12/30/2015, 4:05 PM

## 2015-12-30 NOTE — Progress Notes (Signed)
PHARMACIST - PHYSICIAN COMMUNICATION DR:   Hollice Gong CONCERNING: Antibiotic IV to Oral Route Change Policy  RECOMMENDATION: This patient is receiving Zithromax by the intravenous route.  Based on criteria approved by the Pharmacy and Therapeutics Committee, the antibiotic(s) is/are being converted to the equivalent oral dose form(s).   DESCRIPTION: These criteria include:  Patient being treated for a respiratory tract infection, urinary tract infection, cellulitis or clostridium difficile associated diarrhea if on metronidazole  The patient is not neutropenic and does not exhibit a GI malabsorption state  The patient is eating (either orally or via tube) and/or has been taking other orally administered medications for a least 24 hours  The patient is improving clinically and has a Tmax < 100.5  If you have questions about this conversion, please contact the Pharmacy Department  []   [phone] )  [facility] []   [phone] )  [facility] []   [phone] )  [facility] []   [phone] )  [facility] [x]   [phone] )  [place], PharmD, BCPS Pager: [phone] 12/30/2015@11 :05 AM

## 2015-12-30 NOTE — Care Management Note (Signed)
Case Management Note  Patient Details  Name: Brianna [name] MRN: [id number] Date of Birth: [birth year]  Subjective/Objective: Spoke to patient/dtr L[place]-dtr lives 1hr away from patient c#[phone] in rm about d/c plans-patient has 24hr caregivers. PT-recc HH vs snf-patientagree to home w/HHC since has 24hrs caregivers. [facility] chosen for HHRN/PT/social work-rep Santiago Glad aware of orders. Patient has all dme needed.  Will need PTAR non-emergency transp @ d/c-confirmed address of face sheet.                   Action/Plan:d/c home w/HHC.   Expected Discharge Date:   (unknown)               Expected Discharge Plan:  [place]  In-House Referral:     Discharge planning Services  CM Consult  Post Acute Care Choice:    Choice offered to:  Patient  DME Arranged:    DME Agency:     HH Arranged:  RN, PT, Social Work [employer] Agency:  [place]  Status of Service:  In process, will continue to follow  Medicare Important Message Given:    Date Medicare IM Given:    Medicare IM give by:    Date Additional Medicare IM Given:    Additional Medicare Important Message give by:     If discussed at [place] of Stay Meetings, dates discussed:    Additional Comments:  Dessa Phi, RN 12/30/2015, 1:17 PM

## 2015-12-30 NOTE — Progress Notes (Signed)
Physical Therapy Treatment Patient Details Name: Brianna [name] MRN: [id number] DOB: 06-Jan-1930 Today's Date: 12/30/2015    History of Present Illness 82 year old female with history of recurrent falls, right breast cancer status post mastectomy and chemotherapy, pacemaker, history of recent fall that resulted in communicated right humeral fracture about a week ago, HTN and emphysema. Patient presents following a syncopal episode that occurred when patient tried to stand from a sitting position.    PT Comments    Pt assisted with ambulation and limited by dizziness.  Pt currently requiring at least min assist for mobility.  Recommend 24/7 assist upon d/c due to high fall risk.  Follow Up Recommendations  Supervision/Assistance - 24 hour;Home health PT (SNF if 24/7 assist not available)     Equipment Recommendations  None recommended by PT    Recommendations for Other Services       Precautions / Restrictions Precautions Precautions: Fall Precaution Comments: dizziness Required Braces or Orthoses: Sling Restrictions Weight Bearing Restrictions: Yes Other Position/Activity Restrictions: likely NWB RUE due to fx    Mobility  Bed Mobility Overal bed mobility: Needs Assistance Bed Mobility: Supine to Sit     Supine to sit: Min guard     General bed mobility comments: provided a hand for pt to self assist trunk upright with her L UE  Transfers Overall transfer level: Needs assistance Equipment used: Quad cane Transfers: Sit to/from Stand Sit to Stand: Min assist         General transfer comment: verbal cues for hand placement, min assist from bed, improved with ability to use armrest (recliner)  Ambulation/Gait Ambulation/Gait assistance: Min guard Ambulation Distance (Feet): 80 Feet Assistive device: Quad cane Gait Pattern/deviations: Step-through pattern;Decreased stride length     General Gait Details: verbal cues for use of small based quad cane, pt mildly  unsteady however improved when correctly using quad cane, limited by dizziness requiring 2 seated rest breaks, upon first rest break: 143/64 mmhg, 97% room air, 77 bpm   Stairs            Wheelchair Mobility    Modified Rankin (Stroke Patients Only)       Balance                                    Cognition Arousal/Alertness: Awake/alert Behavior During Therapy: WFL for tasks assessed/performed Overall Cognitive Status: Within Functional Limits for tasks assessed                      Exercises      General Comments        Pertinent Vitals/Pain Pain Assessment: 0-10 Pain Score: 3  Faces Pain Scale: Hurts a little bit Pain Location: R shoulder Pain Descriptors / Indicators: Sore Pain Intervention(s): Limited activity within patient's tolerance;Monitored during session;Repositioned    Home Living                      Prior Function            PT Goals (current goals can now be found in the care plan section) Acute Rehab PT Goals Patient Stated Goal: to go home Progress towards PT goals: Progressing toward goals    Frequency  Min 3X/week    PT Plan Current plan remains appropriate    Co-evaluation  End of Session Equipment Utilized During Treatment: Gait belt Activity Tolerance: Patient tolerated treatment well Patient left:  (with OT)     Time: [id number] PT Time Calculation (min) (ACUTE ONLY): 14 min  Charges:  $Gait Training: 8-22 mins                    G Codes:      Bob Eastwood,KATHrine E 01-28-2016, 1:53 PM Carmelia Bake, PT, DPT 2016-01-28 Pager: [phone]

## 2015-12-30 NOTE — Progress Notes (Signed)
Occupational Therapy Treatment Patient Details Name: Brianna [name] MRN: [id number] DOB: 1930-01-20 Today's Date: 12/30/2015    History of present illness 82 year old female with history of recurrent falls, right breast cancer status post mastectomy and chemotherapy, pacemaker, history of recent fall that resulted in communicated right humeral fracture about a week ago, HTN and emphysema. Patient presents following a syncopal episode that occurred when patient tried to stand from a sitting position.   OT comments  Patient progressing towards OT goals. Reports she will have 24/7 A at discharge -- has been arranged. Still with dizziness during ambulation that resolves with seated rest breaks. OT will continue to follow.   Follow Up Recommendations  Supervision/Assistance - 24 hour;Home health OT    Equipment Recommendations  None recommended by OT    Recommendations for Other Services      Precautions / Restrictions Precautions Precautions: Fall Precaution Comments: dizziness Required Braces or Orthoses: Sling Restrictions Weight Bearing Restrictions: Yes Other Position/Activity Restrictions: likely NWB RUE due to fx       Mobility Bed Mobility               General bed mobility comments: patient up with PT  Transfers Overall transfer level: Needs assistance Equipment used: Quad cane Transfers: Sit to/from Stand Sit to Stand: Min assist         General transfer comment: safety cues    Balance                                   ADL Overall ADL's : Needs assistance/impaired                         Toilet Transfer: Minimal assistance;Ambulation;Regular Toilet;BSC (quad cane)   Toileting- Clothing Manipulation and Hygiene: Minimal assistance;Sit to/from stand       Functional mobility during ADLs: Minimal assistance;Cane;Cueing for safety (quad cane) General ADL Comments: Patient practiced toileting task, then up to recliner for  lunch. Patient reports dizziness with mobility/ADLs that resolves once she sits and rests. OT will continue to follow.      Vision                     Perception     Praxis      Cognition   Behavior During Therapy: WFL for tasks assessed/performed Overall Cognitive Status: Within Functional Limits for tasks assessed                       Extremity/Trunk Assessment               Exercises     Shoulder Instructions       General Comments      Pertinent Vitals/ Pain       Pain Assessment: Faces Faces Pain Scale: Hurts a little bit Pain Location: R shoulder Pain Descriptors / Indicators: Sore Pain Intervention(s): Limited activity within patient's tolerance;Monitored during session;Repositioned  Home Living                                          Prior Functioning/Environment              Frequency Min 2X/week     Progress Toward Goals  OT Goals(current goals can now be  found in the care plan section)  Progress towards OT goals: Progressing toward goals  Acute Rehab OT Goals Patient Stated Goal: to go home  Plan Discharge plan remains appropriate    Co-evaluation                 End of Session Equipment Utilized During Treatment: Gait belt;Other (comment) (sling, quad cane)   Activity Tolerance Patient tolerated treatment well   Patient Left in chair;with call bell/phone within reach;with chair alarm set;with family/visitor present   Nurse Communication Other (comment) (IV beeping)        [id number] OT Time Calculation (min): 12 min  Charges: OT General Charges $OT Visit: 1 Procedure OT Treatments $Self Care/Home Management : 8-22 mins  Cailyn Houdek A 12/30/2015, 12:39 PM

## 2015-12-30 NOTE — Progress Notes (Signed)
PROGRESS NOTE  Brianna [name] [id number] DOB: 1930/01/06 DOA: 12/28/2015 PCP: Donnamae Jude, MD  HPI/Recap of past 78 hours: 82 year old female with history of recurrent falls, right breast cancer status post mastectomy and chemotherapy, recurrent fall with history of recent fall that resulted in communicated right humeral fracture about a week ago, HTN and emphysema who was admitted after syncopal episode.  She has no current complaints, says that she has no family locally but has caregivers from 9am to 9pm daily. She does not have 24/7 care at home.  Assessment/Plan:   HYPERCHOLESTEROLEMIA - cont home meds   HYPERTENSION, BENIGN SYSTEMIC - BP is actually elevated but given dizziness will not increase. Cont home ACE here, and watch BP closely.   BREAST CANCER, HX OF - 15 years ago, has been compliant with surveillance. New C6 lesion caused some concern for mets, but bone scan done 6/7 without obvious evidence of metastatic disease.  COPD (chronic obstructive pulmonary disease) (HCC) - no s/s of exacerbation CAP - changes on CXR c/w PNA, treating with empiric IV abx, may also be a cause of her volume depletion, she is not comfortable on room air, not coughing either   Syncope - poss due to volume depletion, this has been an ongoing problem for her, was seen by PT who recommend home with supervision. She will need to go to SNF.   Fracture of right humerus - already splinted, ortho has been consulted. Anticipate that this is not going to require surgical repair  Code Status: DNR   Family Communication: None present   Disposition Plan: SNF vs [facility]    Consultants:  Ortho   Procedures:  None   Antimicrobials:  Azithromycin and Rocephin     Objective: Filed Vitals:   12/29/15 2143 12/30/15 0613 12/30/15 0638 12/30/15 0814  BP: 126/67  142/74   Pulse: 83  79 82  Temp: 97.7 F (36.5 C)  98 F (36.7 C)   TempSrc: Oral  Oral   Resp: 16  18 18   Height:      Weight:        SpO2: 95% 92% 95% 95%    Intake/Output Summary (Last 24 hours) at 12/30/15 0934 Last data filed at 12/30/15 [id number]  Gross per 24 hour  Intake   2450 ml  Output      0 ml  Net   2450 ml   Filed Weights   12/28/15 1442  Weight: 59.8 kg (131 lb 13.4 oz)    Exam: General:  Alert, oriented, calm, in no acute distress Eyes: pupils round and reactive to light and accomodation, clear sclerea Neck: supple, no masses, trachea mildline  Cardiovascular: RRR, no murmurs or rubs, no peripheral edema  Respiratory: clear to auscultation bilaterally, no wheezes, no crackles  Abdomen: soft, nontender, nondistended, normal bowel tones heard  Skin: dry, no rashes  Musculoskeletal: no joint effusions, normal range of motion except in R arm which is in sling Psychiatric: appropriate affect, normal speech  Neurologic: extraocular muscles intact, clear speech, moving all extremities with intact sensorium    Data Reviewed: CBC:  Recent Labs Lab 12/28/15 1135 12/28/15 1437 12/29/15 0145 12/30/15 0433  WBC 8.4 8.9 7.4 7.5  NEUTROABS 6.2  --  4.4  --   HGB 10.3* 10.8* 9.7* 9.8*  HCT 31.4* 33.0* 29.3* 28.9*  MCV 92.9 92.7 92.7 90.0  PLT 334 340 300 [id number]   Basic Metabolic Panel:  Recent Labs Lab 12/28/15 1135 12/28/15 1437 12/29/15 0145  12/30/15 0433  NA 137  --  135 136  K 4.0  --  4.1 4.3  CL 102  --  102 106  CO2 27  --  27 23  GLUCOSE 100*  --  97 92  BUN 13  --  15 15  CREATININE 0.68 0.70 0.76 0.89  CALCIUM 8.8*  --  8.5* 8.4*   GFR: Estimated Creatinine Clearance: 41.4 mL/min (by C-G formula based on Cr of 0.89). Liver Function Tests:  Recent Labs Lab 12/28/15 1135 12/29/15 0145  AST 23 22  ALT 12* 11*  ALKPHOS 103 92  BILITOT 0.8 0.8  PROT 7.1 6.6  ALBUMIN 3.4* 3.2*   No results for input(s): LIPASE, AMYLASE in the last 168 hours. No results for input(s): AMMONIA in the last 168 hours. Coagulation Profile: No results for input(s): INR, PROTIME in the last 168  hours. Cardiac Enzymes:  Recent Labs Lab 12/28/15 1437 12/28/15 2045 12/29/15 0145  TROPONINI 0.03 <0.03 <0.03   BNP (last 3 results) No results for input(s): PROBNP in the last 8760 hours. HbA1C: No results for input(s): HGBA1C in the last 72 hours. CBG: No results for input(s): GLUCAP in the last 168 hours. Lipid Profile: No results for input(s): CHOL, HDL, LDLCALC, TRIG, CHOLHDL, LDLDIRECT in the last 72 hours. Thyroid Function Tests: No results for input(s): TSH, T4TOTAL, FREET4, T3FREE, THYROIDAB in the last 72 hours. Anemia Panel: No results for input(s): VITAMINB12, FOLATE, FERRITIN, TIBC, IRON, RETICCTPCT in the last 72 hours. Urine analysis:    Component Value Date/Time   COLORURINE YELLOW 12/29/2015 0135   APPEARANCEUR CLEAR 12/29/2015 0135   LABSPEC 1.015 12/29/2015 0135   PHURINE 7.0 12/29/2015 0135   GLUCOSEU NEGATIVE 12/29/2015 0135   HGBUR NEGATIVE 12/29/2015 0135   BILIRUBINUR NEGATIVE 12/29/2015 0135   BILIRUBINUR NEG 10/26/2010 1435   KETONESUR NEGATIVE 12/29/2015 0135   PROTEINUR NEGATIVE 12/29/2015 0135   PROTEINUR NEG 10/26/2010 1435   UROBILINOGEN 0.2 10/26/2010 1435   UROBILINOGEN 0.2 06/15/2010 2135   NITRITE NEGATIVE 12/29/2015 0135   NITRITE NEG 10/26/2010 1435   LEUKOCYTESUR NEGATIVE 12/29/2015 0135   Sepsis Labs: @LABRCNTIP (procalcitonin:4,lacticidven:4)  )No results found for this or any previous visit (from the past 240 hour(s)).    Studies: Nm Bone Scan Whole Body  12/29/2015  CLINICAL DATA:  History of breast malignancy, right humeral fracture 1 week ago, abnormal C6-7 lesion noted on CT scan of December 28, 2015 EXAM: NUCLEAR MEDICINE WHOLE BODY BONE SCAN TECHNIQUE: Whole body anterior and posterior images were obtained approximately 3 hours after intravenous injection of radiopharmaceutical. RADIOPHARMACEUTICALS:  26.4 mCi Technetium-65m MDP IV COMPARISON:  Plain radiograph of the right shoulder of December 28, 2015; CT scan of the head and  neck of December 28, 2015. FINDINGS: There is adequate uptake of the radiopharmaceutical by the skeleton. There is adequate soft tissue clearance and renal activity. Uptake within the calvarium is normal. There is mildly increased uptake in the posterior aspect of C7 greatest on the left that corresponds to the CT findings in the facet region on the left at C6-7. There is minimally increased uptake in the posterior elements of the mid thoracic and lower thoracic and upper lumbar spines posteriorly. There is reverse S shaped thoracolumbar scoliosis, stable. There is increased uptake in the anterior right fourth and fifth rib ends. Uptake within the pelvis is normal. There is mildly increased uptake associated with the prosthetic right hip joint. There is intensely increased uptake associated with the acute fracture of  the right humeral head and neck. IMPRESSION: 1. Markedly increased uptake associated with acute right humeral fracture. 2. Mildly increased uptake associated with the left C6-7 facet joint. This is likely degenerative in nature given the fairly low level increased uptake in the CT appearance. 3. Increased uptake in the anterior ends of the right fourth and fifth ribs is likely post traumatic. 4. No findings highly suspicious for metastatic disease. Electronically Signed   By: David  Martinique M.D.   On: 12/29/2015 13:09    Scheduled Meds: . aspirin EC  81 mg Oral Daily  . atorvastatin  40 mg Oral Daily  . azithromycin  500 mg Intravenous Q24H  . calcium-vitamin D  1 tablet Oral Daily  . carvedilol  3.125 mg Oral BID WC  . cefTRIAXone (ROCEPHIN)  IV  1 g Intravenous Q24H  . enalapril  5 mg Oral Daily  . enoxaparin (LOVENOX) injection  40 mg Subcutaneous Q24H  . feeding supplement (ENSURE ENLIVE)  237 mL Oral Daily  . FLUoxetine  10 mg Oral Daily  . ipratropium-albuterol  3 mL Nebulization TID  . meclizine  12.5 mg Oral TID  . mometasone-formoterol  2 puff Inhalation BID  . multivitamin with  minerals   Oral QHS    Continuous Infusions: . sodium chloride 75 mL/hr at 12/29/15 1249     LOS: 2 days   Time spent: 21 mins  Baer Hinton Marry Guan, MD [facility] Pager [phone]  If 7PM-7AM, please contact night-coverage [email or web address] Password [facility] 12/30/2015, 9:34 AM

## 2015-12-30 NOTE — Discharge Summary (Signed)
Discharge Summary  Brianna [name] [id number] DOB: 02/21/1930  PCP: Donnamae Jude, MD  Admit date: 12/28/2015 Discharge date: 12/30/2015   Recommendations for Outpatient Follow-up:  1. PCP 1-2 weeks 2. Orthopedics as previously scheduled.   Discharge Diagnoses:  Active Hospital Problems   Diagnosis Date Noted  . Fracture of right humerus 12/29/2015  . Syncope 12/28/2015  . COPD (chronic obstructive pulmonary disease) ([place]) 06/10/2014  . BREAST CANCER, HX OF 11/05/2007  . HYPERCHOLESTEROLEMIA 09/20/2006  . HYPERTENSION, BENIGN SYSTEMIC 09/20/2006    Resolved Hospital Problems   Diagnosis Date Noted Date Resolved  No resolved problems to display.    Discharge Condition: Stable   Diet recommendation: Heart Healthy   Filed Vitals:   12/30/15 0814 12/30/15 1306  BP:  106/51  Pulse: 82 86  Temp:  97.5 F (36.4 C)  Resp: 18 16    History of present illness:  82 year old female with history of recurrent falls, right breast cancer status post mastectomy and chemotherapy, recurrent fall with history of recent fall that resulted in communicated right humeral fracture about a week ago, HTN and emphysema who was admitted after syncopal episode. She has no current complaints, says that she has no family locally but has caregivers from 9am to 9pm daily. She does not have 24/7 care at home, however case management has arranged for 24/7 care and patient feels ready for discharge home today.  Assessment/Plan:  HYPERCHOLESTEROLEMIA - cont home meds  HYPERTENSION, BENIGN SYSTEMIC - BP is actually elevated but given dizziness will not increase. Cont home ACE here, and watch BP closely.  BREAST CANCER, HX OF - 15 years ago, has been compliant with surveillance. New C6 lesion caused some concern for mets, but bone scan done 6/7 without obvious evidence of metastatic disease. COPD (chronic obstructive pulmonary disease) ([facility]) - no s/s of exacerbation CAP - changes on CXR c/w PNA,  treating with empiric IV abx, may also be a cause of her volume depletion, she is now comfortable on room air, not coughing either. Will complete course of PO antibiotics at discharge.  Syncope - poss due to volume depletion, this has been an ongoing problem for her, was seen by PT who recommend home with supervision. Initially thought she would need SNF, but 24/7 care has been arranged and she can go home today with [facility] services.  Fracture of right humerus - already splinted, ortho has been consulted as outpatient and she has a follow up with them per the patient. Anticipate that this is not going to require surgical repair  Procedures:  NM Scan whole body with no evidence of metastatic disease   Consultations:  None   Discharge Exam: BP 106/51 mmHg  Pulse 86  Temp(Src) 97.5 F (36.4 C) (Oral)  Resp 16  Ht 5\' 4"  (1.626 m)  Wt 59.8 kg (131 lb 13.4 oz)  BMI 22.62 kg/m2  SpO2 98% General:  Alert, oriented, calm, in no acute distress  Eyes: pupils round and reactive to light and accomodation, clear sclerea Neck: supple, no masses, trachea mildline  Cardiovascular: RRR, no murmurs or rubs, no peripheral edema  Respiratory: clear to auscultation bilaterally, no wheezes, no crackles  Abdomen: soft, nontender, nondistended, normal bowel tones heard  Skin: dry, no rashes  Musculoskeletal: no joint effusions, normal range of motion  Psychiatric: appropriate affect, normal speech  Neurologic: extraocular muscles intact, clear speech, moving all extremities with intact sensorium   Discharge Instructions You were cared for by a hospitalist during your  hospital stay. If you have any questions about your discharge medications or the care you received while you were in the hospital after you are discharged, you can call the unit and asked to speak with the hospitalist on call if the hospitalist that took care of you is not available. Once you are discharged, your primary care physician will  handle any further medical issues. Please note that NO REFILLS for any discharge medications will be authorized once you are discharged, as it is imperative that you return to your primary care physician (or establish a relationship with a primary care physician if you do not have one) for your aftercare needs so that they can reassess your need for medications and monitor your lab values.     Medication List    STOP taking these medications        furosemide 20 MG tablet  Commonly known as:  LASIX     traMADol 50 MG tablet  Commonly known as:  ULTRAM      TAKE these medications        ADVAIR DISKUS 100-50 MCG/DOSE Aepb  Generic drug:  Fluticasone-Salmeterol  INHALE 1 PUFF INTO THE LUNGS 2 (TWO) TIMES DAILY.     aspirin EC 81 MG tablet  Take 81 mg by mouth daily.     atorvastatin 40 MG tablet  Commonly known as:  LIPITOR  Take 40 mg by mouth daily.     azithromycin 250 MG tablet  Commonly known as:  ZITHROMAX  Take 500mg  PO daily for 3 days.     CALTRATE 600+D 600-400 MG-UNIT tablet  Generic drug:  Calcium Carbonate-Vitamin D  Take 1 tablet by mouth daily.     carvedilol 3.125 MG tablet  Commonly known as:  COREG  TAKE 1 TABLET (3.125 MG TOTAL) BY MOUTH 2 (TWO) TIMES DAILY WITH A MEAL.     cefUROXime 500 MG tablet  Commonly known as:  CEFTIN  Take 1 tablet (500 mg total) by mouth 2 (two) times daily with a meal.     enalapril 2.5 MG tablet  Commonly known as:  VASOTEC  TAKE 2 TABLETS BY MOUTH DAILY     feeding supplement (ENSURE COMPLETE) Liqd  Take 237 mLs by mouth 3 (three) times daily between meals.     FLUoxetine 10 MG capsule  Commonly known as:  PROZAC  TAKE ONE CAPSULE BY MOUTH DAILY     meclizine 12.5 MG tablet  Commonly known as:  ANTIVERT  Take 1 tablet (12.5 mg total) by mouth 3 (three) times daily.     MULTI-VITAMIN DAILY PO  Take 1 tablet by mouth at bedtime.     PROAIR HFA 108 (90 Base) MCG/ACT inhaler  Generic drug:  albuterol  Inhale 2  puffs into the lungs every 6 (six) hours as needed for wheezing or shortness of breath.       Allergies  Allergen Reactions  . Atorvastatin     Fatigue   . Claritin [Loratadine] Other (See Comments)    fatigue  . Crestor [Rosuvastatin]     Myalgias. Pt is tolerating Lipitor 20mg  QOD well.      The results of significant diagnostics from this hospitalization (including imaging, microbiology, ancillary and laboratory) are listed below for reference.    Significant Diagnostic Studies: Dg Chest 2 View  12/28/2015  CLINICAL DATA:  Pain following fall.  History of breast carcinoma EXAM: CHEST  2 VIEW COMPARISON:  January 07, 2014 FINDINGS: There is patchy infiltrate in  the right lung base. Lungs elsewhere are clear. Heart is upper normal in size with pulmonary vascularity within normal limits. There is atherosclerotic calcification in the aorta. Pacemaker leads are attached to the right ventricle. There is a comminuted acute appearing fracture of the proximal right humerus in the metaphysis seal region. The humeral shaft is displaced medially with respect to the humeral head. No gross dislocation seen. IMPRESSION: Acute fracture proximal right humeral metaphysis. Dedicated right shoulder radiographs may be advisable for further assessment. Patchy infiltrate right base. Lungs elsewhere clear. Stable cardiac silhouette. No pneumothorax. Electronically Signed   By: Lowella Grip III M.D.   On: 12/28/2015 11:37   Dg Shoulder Right  12/28/2015  CLINICAL DATA:  [place] today at home and injured right shoulder. EXAM: RIGHT SHOULDER - 2+ VIEW COMPARISON:  None. FINDINGS: Complex comminuted multipart fracture of the humeral head/neck with impaction and displacement. The [facility] joint is intact. IMPRESSION: Complex comminuted multipart fracture of the humeral head/ neck. Electronically Signed   By: Marijo Sanes M.D.   On: 12/28/2015 12:13   Dg Shoulder Right  12/27/2015  CLINICAL DATA:  [place] from bed. EXAM: RIGHT  SHOULDER - 2+ VIEW COMPARISON:  12/27/2015 FINDINGS: There is a comminuted fracture involving the surgical neck of humerus as well as the greater tuberosity. No dislocation. Mild medial displacement of the distal fracture fragments. IMPRESSION: 1. Acute fracture involves the surgical neck and greater tuberosity of the proximal humerus. Electronically Signed   By: Kerby Moors M.D.   On: 12/27/2015 11:35   Ct Head Wo Contrast  12/28/2015  CLINICAL DATA:  Pain and dizziness following fall. History of breast carcinoma EXAM: CT HEAD WITHOUT CONTRAST CT CERVICAL SPINE WITHOUT CONTRAST TECHNIQUE: Multidetector CT imaging of the head and cervical spine was performed following the standard protocol without intravenous contrast. Multiplanar CT image reconstructions of the cervical spine were also generated. COMPARISON:  Cervical spine CT May 09, 2014; head CT Nov 29, 2015 FINDINGS: CT HEAD FINDINGS Moderate diffuse atrophy is stable. There is no intracranial mass, hemorrhage, extra-axial fluid collection, or midline shift. There is small vessel disease in the centra semiovale bilaterally, stable. No acute infarct evident. The bony calvarium appears intact. The mastoid air cells are clear. There are no intraorbital lesions evident. CT CERVICAL SPINE FINDINGS There is no acute fracture. There is stable slight anterolisthesis of C6 on C7. There is stable mild anterolisthesis of C7 on T1 and T1 on T2. No new spondylolisthesis evident. Prevertebral soft tissues and predental space regions are normal. There is erosive appearing change at the level of the pars interarticularis on the left at C6-7. This area represents a change compared to prior study. This finding is best appreciable on axial slices 49, 50, and 51 series 602 but is also present on coronal slices 21- 26, series 603. Elsewhere there is stable multilevel osteoarthritic change. There is exit foraminal narrowing due to bony hypertrophy on the left at C4-5 and on  the left at C5-6. There is calcification in each carotid artery. There is fibrotic type change in each lung apex, also present on prior study. IMPRESSION: CT head: Atrophy with periventricular small vessel disease. No intracranial mass, hemorrhage, or extra-axial fluid collection. No acute infarct evident. CT cervical spine: No fracture. Stable areas of spondylolisthesis as noted above, likely due to underlying spondylosis. Multilevel osteoarthritic change. New apparent erosive change on the left at C6-7. This finding represents a change from prior study. Given history of breast carcinoma, early lytic lesion  in this area must be of concern. This finding may warrant correlation with cervical MR pre and post-contrast to further evaluate for possible neoplastic involvement in this area. Electronically Signed   By: Lowella Grip III M.D.   On: 12/28/2015 11:51   Ct Cervical Spine Wo Contrast  12/28/2015  CLINICAL DATA:  Pain and dizziness following fall. History of breast carcinoma EXAM: CT HEAD WITHOUT CONTRAST CT CERVICAL SPINE WITHOUT CONTRAST TECHNIQUE: Multidetector CT imaging of the head and cervical spine was performed following the standard protocol without intravenous contrast. Multiplanar CT image reconstructions of the cervical spine were also generated. COMPARISON:  Cervical spine CT May 09, 2014; head CT Nov 29, 2015 FINDINGS: CT HEAD FINDINGS Moderate diffuse atrophy is stable. There is no intracranial mass, hemorrhage, extra-axial fluid collection, or midline shift. There is small vessel disease in the centra semiovale bilaterally, stable. No acute infarct evident. The bony calvarium appears intact. The mastoid air cells are clear. There are no intraorbital lesions evident. CT CERVICAL SPINE FINDINGS There is no acute fracture. There is stable slight anterolisthesis of C6 on C7. There is stable mild anterolisthesis of C7 on T1 and T1 on T2. No new spondylolisthesis evident. Prevertebral soft  tissues and predental space regions are normal. There is erosive appearing change at the level of the pars interarticularis on the left at C6-7. This area represents a change compared to prior study. This finding is best appreciable on axial slices 49, 50, and 51 series 602 but is also present on coronal slices 21- 26, series 603. Elsewhere there is stable multilevel osteoarthritic change. There is exit foraminal narrowing due to bony hypertrophy on the left at C4-5 and on the left at C5-6. There is calcification in each carotid artery. There is fibrotic type change in each lung apex, also present on prior study. IMPRESSION: CT head: Atrophy with periventricular small vessel disease. No intracranial mass, hemorrhage, or extra-axial fluid collection. No acute infarct evident. CT cervical spine: No fracture. Stable areas of spondylolisthesis as noted above, likely due to underlying spondylosis. Multilevel osteoarthritic change. New apparent erosive change on the left at C6-7. This finding represents a change from prior study. Given history of breast carcinoma, early lytic lesion in this area must be of concern. This finding may warrant correlation with cervical MR pre and post-contrast to further evaluate for possible neoplastic involvement in this area. Electronically Signed   By: Lowella Grip III M.D.   On: 12/28/2015 11:51   Nm Bone Scan Whole Body  12/29/2015  CLINICAL DATA:  History of breast malignancy, right humeral fracture 1 week ago, abnormal C6-7 lesion noted on CT scan of December 28, 2015 EXAM: NUCLEAR MEDICINE WHOLE BODY BONE SCAN TECHNIQUE: Whole body anterior and posterior images were obtained approximately 3 hours after intravenous injection of radiopharmaceutical. RADIOPHARMACEUTICALS:  26.4 mCi Technetium-40m MDP IV COMPARISON:  Plain radiograph of the right shoulder of December 28, 2015; CT scan of the head and neck of December 28, 2015. FINDINGS: There is adequate uptake of the radiopharmaceutical by the  skeleton. There is adequate soft tissue clearance and renal activity. Uptake within the calvarium is normal. There is mildly increased uptake in the posterior aspect of C7 greatest on the left that corresponds to the CT findings in the facet region on the left at C6-7. There is minimally increased uptake in the posterior elements of the mid thoracic and lower thoracic and upper lumbar spines posteriorly. There is reverse S shaped thoracolumbar scoliosis, stable. There is  increased uptake in the anterior right fourth and fifth rib ends. Uptake within the pelvis is normal. There is mildly increased uptake associated with the prosthetic right hip joint. There is intensely increased uptake associated with the acute fracture of the right humeral head and neck. IMPRESSION: 1. Markedly increased uptake associated with acute right humeral fracture. 2. Mildly increased uptake associated with the left C6-7 facet joint. This is likely degenerative in nature given the fairly low level increased uptake in the CT appearance. 3. Increased uptake in the anterior ends of the right fourth and fifth ribs is likely post traumatic. 4. No findings highly suspicious for metastatic disease. Electronically Signed   By: David  Martinique M.D.   On: 12/29/2015 13:09   Dg Humerus Right  12/27/2015  CLINICAL DATA:  Status post fall out of bed. EXAM: RIGHT HUMERUS - 2+ VIEW COMPARISON:  None. FINDINGS: Comminuted fracture of the surgical neck of the right proximal humerus with 2 cm of medial displacement. Comminuted fracture involves the lesser tuberosity with approximately 9 mm of lateral displacement of the greater tuberosity. No other fracture or dislocation. IMPRESSION: Comminuted, displaced fracture of the surgical neck of the right proximal humerus. Electronically Signed   By: Kathreen Devoid   On: 12/27/2015 11:31   Mr Attempted Study-no Report  12/28/2015  This examination belongs to an outside facility and is stored here for comparison  purposes only.  Contact the originating outside institution for any associated report or interpretation.   Microbiology: No results found for this or any previous visit (from the past 240 hour(s)).   Labs: Basic Metabolic Panel:  Recent Labs Lab 12/28/15 1135 12/28/15 1437 12/29/15 0145 12/30/15 0433  NA 137  --  135 136  K 4.0  --  4.1 4.3  CL 102  --  102 106  CO2 27  --  27 23  GLUCOSE 100*  --  97 92  BUN 13  --  15 15  CREATININE 0.68 0.70 0.76 0.89  CALCIUM 8.8*  --  8.5* 8.4*   Liver Function Tests:  Recent Labs Lab 12/28/15 1135 12/29/15 0145  AST 23 22  ALT 12* 11*  ALKPHOS 103 92  BILITOT 0.8 0.8  PROT 7.1 6.6  ALBUMIN 3.4* 3.2*   No results for input(s): LIPASE, AMYLASE in the last 168 hours. No results for input(s): AMMONIA in the last 168 hours. CBC:  Recent Labs Lab 12/28/15 1135 12/28/15 1437 12/29/15 0145 12/30/15 0433  WBC 8.4 8.9 7.4 7.5  NEUTROABS 6.2  --  4.4  --   HGB 10.3* 10.8* 9.7* 9.8*  HCT 31.4* 33.0* 29.3* 28.9*  MCV 92.9 92.7 92.7 90.0  PLT 334 340 300 274   Cardiac Enzymes:  Recent Labs Lab 12/28/15 1437 12/28/15 2045 12/29/15 0145  TROPONINI 0.03 <0.03 <0.03   BNP: BNP (last 3 results) No results for input(s): BNP in the last 8760 hours.  ProBNP (last 3 results) No results for input(s): PROBNP in the last 8760 hours.  CBG: No results for input(s): GLUCAP in the last 168 hours.  Time spent: 35 minutes were spent in preparing this discharge including medication reconciliation, counseling, and coordination of care.  Signed:  Ainhoa Rallo [employer]  [facility] 12/30/2015, 1:07 PM

## 2016-01-01 DIAGNOSIS — E785 Hyperlipidemia, unspecified: Secondary | ICD-10-CM | POA: Diagnosis not present

## 2016-01-01 DIAGNOSIS — I429 Cardiomyopathy, unspecified: Secondary | ICD-10-CM | POA: Diagnosis not present

## 2016-01-01 DIAGNOSIS — J189 Pneumonia, unspecified organism: Secondary | ICD-10-CM | POA: Diagnosis not present

## 2016-01-01 DIAGNOSIS — I10 Essential (primary) hypertension: Secondary | ICD-10-CM | POA: Diagnosis not present

## 2016-01-01 DIAGNOSIS — Z950 Presence of cardiac pacemaker: Secondary | ICD-10-CM | POA: Diagnosis not present

## 2016-01-01 DIAGNOSIS — Z853 Personal history of malignant neoplasm of breast: Secondary | ICD-10-CM | POA: Diagnosis not present

## 2016-01-01 DIAGNOSIS — F329 Major depressive disorder, single episode, unspecified: Secondary | ICD-10-CM | POA: Diagnosis not present

## 2016-01-01 DIAGNOSIS — Z720 Tobacco use: Secondary | ICD-10-CM | POA: Diagnosis not present

## 2016-01-01 DIAGNOSIS — J440 Chronic obstructive pulmonary disease with acute lower respiratory infection: Secondary | ICD-10-CM | POA: Diagnosis not present

## 2016-01-01 DIAGNOSIS — Z66 Do not resuscitate: Secondary | ICD-10-CM | POA: Diagnosis not present

## 2016-01-01 DIAGNOSIS — Z95810 Presence of automatic (implantable) cardiac defibrillator: Secondary | ICD-10-CM | POA: Diagnosis not present

## 2016-01-01 DIAGNOSIS — R296 Repeated falls: Secondary | ICD-10-CM | POA: Diagnosis not present

## 2016-01-01 DIAGNOSIS — S42202D Unspecified fracture of upper end of left humerus, subsequent encounter for fracture with routine healing: Secondary | ICD-10-CM | POA: Diagnosis not present

## 2016-01-02 DIAGNOSIS — J440 Chronic obstructive pulmonary disease with acute lower respiratory infection: Secondary | ICD-10-CM | POA: Diagnosis not present

## 2016-01-02 DIAGNOSIS — F329 Major depressive disorder, single episode, unspecified: Secondary | ICD-10-CM | POA: Diagnosis not present

## 2016-01-02 DIAGNOSIS — J189 Pneumonia, unspecified organism: Secondary | ICD-10-CM | POA: Diagnosis not present

## 2016-01-02 DIAGNOSIS — I10 Essential (primary) hypertension: Secondary | ICD-10-CM | POA: Diagnosis not present

## 2016-01-02 DIAGNOSIS — I429 Cardiomyopathy, unspecified: Secondary | ICD-10-CM | POA: Diagnosis not present

## 2016-01-02 DIAGNOSIS — S42202D Unspecified fracture of upper end of left humerus, subsequent encounter for fracture with routine healing: Secondary | ICD-10-CM | POA: Diagnosis not present

## 2016-01-03 DIAGNOSIS — S42202D Unspecified fracture of upper end of left humerus, subsequent encounter for fracture with routine healing: Secondary | ICD-10-CM | POA: Diagnosis not present

## 2016-01-03 DIAGNOSIS — I429 Cardiomyopathy, unspecified: Secondary | ICD-10-CM | POA: Diagnosis not present

## 2016-01-03 DIAGNOSIS — J440 Chronic obstructive pulmonary disease with acute lower respiratory infection: Secondary | ICD-10-CM | POA: Diagnosis not present

## 2016-01-03 DIAGNOSIS — F329 Major depressive disorder, single episode, unspecified: Secondary | ICD-10-CM | POA: Diagnosis not present

## 2016-01-03 DIAGNOSIS — J189 Pneumonia, unspecified organism: Secondary | ICD-10-CM | POA: Diagnosis not present

## 2016-01-03 DIAGNOSIS — I10 Essential (primary) hypertension: Secondary | ICD-10-CM | POA: Diagnosis not present

## 2016-01-04 ENCOUNTER — Ambulatory Visit: Payer: Federal, State, Local not specified - PPO | Admitting: Cardiovascular Disease

## 2016-01-04 DIAGNOSIS — I10 Essential (primary) hypertension: Secondary | ICD-10-CM | POA: Diagnosis not present

## 2016-01-04 DIAGNOSIS — J440 Chronic obstructive pulmonary disease with acute lower respiratory infection: Secondary | ICD-10-CM | POA: Diagnosis not present

## 2016-01-04 DIAGNOSIS — F329 Major depressive disorder, single episode, unspecified: Secondary | ICD-10-CM | POA: Diagnosis not present

## 2016-01-04 DIAGNOSIS — S42202D Unspecified fracture of upper end of left humerus, subsequent encounter for fracture with routine healing: Secondary | ICD-10-CM | POA: Diagnosis not present

## 2016-01-04 DIAGNOSIS — I429 Cardiomyopathy, unspecified: Secondary | ICD-10-CM | POA: Diagnosis not present

## 2016-01-04 DIAGNOSIS — J189 Pneumonia, unspecified organism: Secondary | ICD-10-CM | POA: Diagnosis not present

## 2016-01-05 DIAGNOSIS — I429 Cardiomyopathy, unspecified: Secondary | ICD-10-CM | POA: Diagnosis not present

## 2016-01-05 DIAGNOSIS — J440 Chronic obstructive pulmonary disease with acute lower respiratory infection: Secondary | ICD-10-CM | POA: Diagnosis not present

## 2016-01-05 DIAGNOSIS — S42202D Unspecified fracture of upper end of left humerus, subsequent encounter for fracture with routine healing: Secondary | ICD-10-CM | POA: Diagnosis not present

## 2016-01-05 DIAGNOSIS — I10 Essential (primary) hypertension: Secondary | ICD-10-CM | POA: Diagnosis not present

## 2016-01-05 DIAGNOSIS — F329 Major depressive disorder, single episode, unspecified: Secondary | ICD-10-CM | POA: Diagnosis not present

## 2016-01-05 DIAGNOSIS — J189 Pneumonia, unspecified organism: Secondary | ICD-10-CM | POA: Diagnosis not present

## 2016-01-06 DIAGNOSIS — S42202D Unspecified fracture of upper end of left humerus, subsequent encounter for fracture with routine healing: Secondary | ICD-10-CM | POA: Diagnosis not present

## 2016-01-06 DIAGNOSIS — I10 Essential (primary) hypertension: Secondary | ICD-10-CM | POA: Diagnosis not present

## 2016-01-06 DIAGNOSIS — I429 Cardiomyopathy, unspecified: Secondary | ICD-10-CM | POA: Diagnosis not present

## 2016-01-06 DIAGNOSIS — J440 Chronic obstructive pulmonary disease with acute lower respiratory infection: Secondary | ICD-10-CM | POA: Diagnosis not present

## 2016-01-06 DIAGNOSIS — J189 Pneumonia, unspecified organism: Secondary | ICD-10-CM | POA: Diagnosis not present

## 2016-01-06 DIAGNOSIS — F329 Major depressive disorder, single episode, unspecified: Secondary | ICD-10-CM | POA: Diagnosis not present

## 2016-01-07 DIAGNOSIS — S42202D Unspecified fracture of upper end of left humerus, subsequent encounter for fracture with routine healing: Secondary | ICD-10-CM | POA: Diagnosis not present

## 2016-01-07 DIAGNOSIS — I429 Cardiomyopathy, unspecified: Secondary | ICD-10-CM | POA: Diagnosis not present

## 2016-01-07 DIAGNOSIS — F329 Major depressive disorder, single episode, unspecified: Secondary | ICD-10-CM | POA: Diagnosis not present

## 2016-01-07 DIAGNOSIS — J189 Pneumonia, unspecified organism: Secondary | ICD-10-CM | POA: Diagnosis not present

## 2016-01-07 DIAGNOSIS — I10 Essential (primary) hypertension: Secondary | ICD-10-CM | POA: Diagnosis not present

## 2016-01-07 DIAGNOSIS — J440 Chronic obstructive pulmonary disease with acute lower respiratory infection: Secondary | ICD-10-CM | POA: Diagnosis not present

## 2016-01-10 DIAGNOSIS — S42202D Unspecified fracture of upper end of left humerus, subsequent encounter for fracture with routine healing: Secondary | ICD-10-CM | POA: Diagnosis not present

## 2016-01-10 DIAGNOSIS — F329 Major depressive disorder, single episode, unspecified: Secondary | ICD-10-CM | POA: Diagnosis not present

## 2016-01-10 DIAGNOSIS — I10 Essential (primary) hypertension: Secondary | ICD-10-CM | POA: Diagnosis not present

## 2016-01-10 DIAGNOSIS — J189 Pneumonia, unspecified organism: Secondary | ICD-10-CM | POA: Diagnosis not present

## 2016-01-10 DIAGNOSIS — I429 Cardiomyopathy, unspecified: Secondary | ICD-10-CM | POA: Diagnosis not present

## 2016-01-10 DIAGNOSIS — J440 Chronic obstructive pulmonary disease with acute lower respiratory infection: Secondary | ICD-10-CM | POA: Diagnosis not present

## 2016-01-11 ENCOUNTER — Other Ambulatory Visit: Payer: Self-pay | Admitting: Family Medicine

## 2016-01-11 DIAGNOSIS — S42291D Other displaced fracture of upper end of right humerus, subsequent encounter for fracture with routine healing: Secondary | ICD-10-CM | POA: Diagnosis not present

## 2016-01-12 ENCOUNTER — Other Ambulatory Visit: Payer: Self-pay | Admitting: Family Medicine

## 2016-01-12 DIAGNOSIS — I429 Cardiomyopathy, unspecified: Secondary | ICD-10-CM | POA: Diagnosis not present

## 2016-01-12 DIAGNOSIS — J189 Pneumonia, unspecified organism: Secondary | ICD-10-CM | POA: Diagnosis not present

## 2016-01-12 DIAGNOSIS — S42202D Unspecified fracture of upper end of left humerus, subsequent encounter for fracture with routine healing: Secondary | ICD-10-CM | POA: Diagnosis not present

## 2016-01-12 DIAGNOSIS — J440 Chronic obstructive pulmonary disease with acute lower respiratory infection: Secondary | ICD-10-CM | POA: Diagnosis not present

## 2016-01-12 DIAGNOSIS — I10 Essential (primary) hypertension: Secondary | ICD-10-CM | POA: Diagnosis not present

## 2016-01-12 DIAGNOSIS — F329 Major depressive disorder, single episode, unspecified: Secondary | ICD-10-CM | POA: Diagnosis not present

## 2016-01-13 ENCOUNTER — Ambulatory Visit (INDEPENDENT_AMBULATORY_CARE_PROVIDER_SITE_OTHER): Payer: Medicare Other | Admitting: Cardiovascular Disease

## 2016-01-13 ENCOUNTER — Encounter: Payer: Self-pay | Admitting: Cardiovascular Disease

## 2016-01-13 VITALS — BP 104/66 | HR 75 | Ht 65.0 in | Wt 127.2 lb

## 2016-01-13 DIAGNOSIS — I10 Essential (primary) hypertension: Secondary | ICD-10-CM | POA: Diagnosis not present

## 2016-01-13 DIAGNOSIS — I4729 Other ventricular tachycardia: Secondary | ICD-10-CM

## 2016-01-13 DIAGNOSIS — E7800 Pure hypercholesterolemia, unspecified: Secondary | ICD-10-CM

## 2016-01-13 DIAGNOSIS — I472 Ventricular tachycardia, unspecified: Secondary | ICD-10-CM

## 2016-01-13 DIAGNOSIS — I5032 Chronic diastolic (congestive) heart failure: Secondary | ICD-10-CM | POA: Diagnosis not present

## 2016-01-13 NOTE — Progress Notes (Signed)
01/13/2016 Brianna [name]   19-Jan-1930  [id number]  Primary Physician Donnamae Jude, MD Primary Cardiologist: Lorretta Harp MD [name]  HPI:   The patient is a very pleasant 82 year old thin appearing widowed Caucasian female mother of 2, grandmother to 2 grandchildren who has a history of nonischemic cardiomyopathy related to chemotherapy for breast cancer 10 years ago. I last saw her in the office 12/22/14. She had an ICD placed for primary prevention by Dr. Elvis Coil in March of 2006 which has been followed by Dr. Sallyanne Kuster.Dr. Sallyanne Kuster last saw her in March and her ICD was functioning appropriately. Her most recent 2D echo performed in our office on February 27, 2012 revealed an EF of 45-50% with mild global hypokinesia and septal wall motion abnormality consistent with a conduction abnormality. She does complain of dyspnea on exertion but denies chest pain. Since I saw her a year ago she was admitted with syncope and shoulder fracture.  Current Outpatient Prescriptions  Medication Sig Dispense Refill  . ADVAIR DISKUS 100-50 MCG/DOSE AEPB INHALE 1 PUFF INTO THE LUNGS 2 (TWO) TIMES DAILY. 60 each 1  . albuterol (PROAIR HFA) 108 (90 BASE) MCG/ACT inhaler Inhale 2 puffs into the lungs every 6 (six) hours as needed for wheezing or shortness of breath.    Marland Kitchen aspirin EC 81 MG tablet Take 81 mg by mouth daily.    Marland Kitchen atorvastatin (LIPITOR) 40 MG tablet Take 40 mg by mouth daily.    . Calcium Carbonate-Vitamin D (CALTRATE 600+D) 600-400 MG-UNIT per tablet Take 1 tablet by mouth daily.     . carvedilol (COREG) 3.125 MG tablet TAKE 1 TABLET (3.125 MG TOTAL) BY MOUTH 2 (TWO) TIMES DAILY WITH A MEAL. 60 tablet 11  . cefUROXime (CEFTIN) 500 MG tablet Take 1 tablet (500 mg total) by mouth 2 (two) times daily with a meal. 6 tablet 0  . enalapril (VASOTEC) 2.5 MG tablet TAKE 2 TABLETS BY MOUTH DAILY 180 tablet 1  . feeding supplement, ENSURE COMPLETE, (ENSURE COMPLETE) LIQD Take 237 mLs  by mouth 3 (three) times daily between meals. (Patient taking differently: Take 237 mLs by mouth daily. ) 6 Bottle 1  . FLUoxetine (PROZAC) 10 MG capsule TAKE ONE CAPSULE BY MOUTH DAILY 30 capsule 2  . meclizine (ANTIVERT) 12.5 MG tablet TAKE 1 TABLET (12.5 MG TOTAL) BY MOUTH 3 (THREE) TIMES DAILY. 30 tablet 0  . Multiple Vitamin (MULTI-VITAMIN DAILY PO) Take 1 tablet by mouth at bedtime.      No current facility-administered medications for this visit.    Allergies  Allergen Reactions  . Atorvastatin     Fatigue   . Claritin [Loratadine] Other (See Comments)    fatigue  . Crestor [Rosuvastatin]     Myalgias. Pt is tolerating Lipitor 20mg  QOD well.    Social History   Social History  . Marital Status: Widowed    Spouse Name: N/A  . Number of Children: 2  . Years of Education: 11   Occupational History  . Retired-Manager Writer.    Social History Main Topics  . Smoking status: Light Tobacco Smoker    Types: Cigarettes  . Smokeless tobacco: Never Used     Comment: Pt reports smoking about 1-2 times a week when stressed.   . Alcohol Use: 0.6 oz/week    1 Glasses of wine per week  . Drug Use: No  . Sexual Activity: No   Other Topics Concern  . Not on  file   Social History Narrative   Health Care POA:    Emergency Contact: friend, Trevor Iha, [phone]   End of Life Plan:    Who lives with you: self, has medical alert button    Any pets: none   Diet: Patient has a varied diet and controls portions well.   Exercise: Patient does not have any regular exercise routine.   Seatbelts: Patient reports wearing seatbelt when in vehicle.   Nancy Fetter Exposure/Protection: Patient reports wearing sun screening daily.   Hobbies: clothes alterations                 Review of Systems: General: negative for chills, fever, night sweats or weight changes.  Cardiovascular: negative for chest pain, dyspnea on exertion, edema, orthopnea, palpitations, paroxysmal nocturnal dyspnea  or shortness of breath Dermatological: negative for rash Respiratory: negative for cough or wheezing Urologic: negative for hematuria Abdominal: negative for nausea, vomiting, diarrhea, bright red blood per rectum, melena, or hematemesis Neurologic: negative for visual changes, syncope, or dizziness All other systems reviewed and are otherwise negative except as noted above.    Blood pressure 104/66, pulse 75, height 5\' 5"  (1.651 m), weight 127 lb 3.2 oz (57.698 kg).  General appearance: alert and no distress Neck: no adenopathy, no carotid bruit, no JVD, supple, symmetrical, trachea midline and thyroid not enlarged, symmetric, no tenderness/mass/nodules Lungs: clear to auscultation bilaterally Heart: regular rate and rhythm, S1, S2 normal, no murmur, click, rub or gallop Extremities: extremities normal, atraumatic, no cyanosis or edema  EKG sinus rhythm at 75 with left bundle-branch block. I personally reviewed this EKG  ASSESSMENT AND PLAN:   HYPERCHOLESTEROLEMIA History of hyperlipidemia on statin therapy followed by her PCP  HYPERTENSION, BENIGN SYSTEMIC history of hypertension blood pressure measured to 104/66. She is on carvedilol and enalapril. Continue current meds at current dosing  VENTRICULAR TACHYCARDIA History of ventricular tachycardia and nonischemic myopathy secondary to chemotherapy and breast cancer over 10 years ago. She had an ICD placed for primary prevention by Dr. Phillip Heal March 2006 which has been followed by Dr. Sallyanne Kuster since. This is followed caudally remotely by [facility] Her most recent 2-D echo performed 12/29/15 revealed ejection fraction of 40-45% with grade 1 diastolic dysfunction similar to previous echoes.She has not had an ICD discharge.  Chronic diastolic heart failure Chronic stable compensated systolic and diastolic heart failure with chronic dyspnea on exertion on appropriate medications. Her EF is in the 40-45% range.      Lorretta Harp  MD FACP,FACC,FAHA, [facility] 01/13/2016 3:51 PM

## 2016-01-13 NOTE — Assessment & Plan Note (Signed)
Chronic stable compensated systolic and diastolic heart failure with chronic dyspnea on exertion on appropriate medications. Her EF is in the 40-45% range.

## 2016-01-13 NOTE — Assessment & Plan Note (Signed)
History of ventricular tachycardia and nonischemic myopathy secondary to chemotherapy and breast cancer over 10 years ago. She had an ICD placed for primary prevention by Dr. Phillip Heal March 2006 which has been followed by Dr. Sallyanne Kuster since. This is followed caudally remotely by [facility] Her most recent 2-D echo performed 12/29/15 revealed ejection fraction of 40-45% with grade 1 diastolic dysfunction similar to previous echoes.She has not had an ICD discharge.

## 2016-01-13 NOTE — Assessment & Plan Note (Signed)
History of hyperlipidemia on statin therapy followed by her PCP. 

## 2016-01-13 NOTE — Assessment & Plan Note (Signed)
history of hypertension blood pressure measured to 104/66. She is on carvedilol and enalapril. Continue current meds at current dosing

## 2016-01-13 NOTE — Patient Instructions (Signed)

## 2016-01-14 DIAGNOSIS — I10 Essential (primary) hypertension: Secondary | ICD-10-CM | POA: Diagnosis not present

## 2016-01-14 DIAGNOSIS — I429 Cardiomyopathy, unspecified: Secondary | ICD-10-CM | POA: Diagnosis not present

## 2016-01-14 DIAGNOSIS — J189 Pneumonia, unspecified organism: Secondary | ICD-10-CM | POA: Diagnosis not present

## 2016-01-14 DIAGNOSIS — F329 Major depressive disorder, single episode, unspecified: Secondary | ICD-10-CM | POA: Diagnosis not present

## 2016-01-14 DIAGNOSIS — J440 Chronic obstructive pulmonary disease with acute lower respiratory infection: Secondary | ICD-10-CM | POA: Diagnosis not present

## 2016-01-14 DIAGNOSIS — S42202D Unspecified fracture of upper end of left humerus, subsequent encounter for fracture with routine healing: Secondary | ICD-10-CM | POA: Diagnosis not present

## 2016-01-17 DIAGNOSIS — F329 Major depressive disorder, single episode, unspecified: Secondary | ICD-10-CM | POA: Diagnosis not present

## 2016-01-17 DIAGNOSIS — I429 Cardiomyopathy, unspecified: Secondary | ICD-10-CM | POA: Diagnosis not present

## 2016-01-17 DIAGNOSIS — I10 Essential (primary) hypertension: Secondary | ICD-10-CM | POA: Diagnosis not present

## 2016-01-17 DIAGNOSIS — J440 Chronic obstructive pulmonary disease with acute lower respiratory infection: Secondary | ICD-10-CM | POA: Diagnosis not present

## 2016-01-17 DIAGNOSIS — S42202D Unspecified fracture of upper end of left humerus, subsequent encounter for fracture with routine healing: Secondary | ICD-10-CM | POA: Diagnosis not present

## 2016-01-17 DIAGNOSIS — J189 Pneumonia, unspecified organism: Secondary | ICD-10-CM | POA: Diagnosis not present

## 2016-01-18 DIAGNOSIS — F329 Major depressive disorder, single episode, unspecified: Secondary | ICD-10-CM | POA: Diagnosis not present

## 2016-01-18 DIAGNOSIS — I429 Cardiomyopathy, unspecified: Secondary | ICD-10-CM | POA: Diagnosis not present

## 2016-01-18 DIAGNOSIS — S42202D Unspecified fracture of upper end of left humerus, subsequent encounter for fracture with routine healing: Secondary | ICD-10-CM | POA: Diagnosis not present

## 2016-01-18 DIAGNOSIS — J440 Chronic obstructive pulmonary disease with acute lower respiratory infection: Secondary | ICD-10-CM | POA: Diagnosis not present

## 2016-01-18 DIAGNOSIS — J189 Pneumonia, unspecified organism: Secondary | ICD-10-CM | POA: Diagnosis not present

## 2016-01-18 DIAGNOSIS — I10 Essential (primary) hypertension: Secondary | ICD-10-CM | POA: Diagnosis not present

## 2016-01-19 DIAGNOSIS — S42202D Unspecified fracture of upper end of left humerus, subsequent encounter for fracture with routine healing: Secondary | ICD-10-CM | POA: Diagnosis not present

## 2016-01-19 DIAGNOSIS — J189 Pneumonia, unspecified organism: Secondary | ICD-10-CM | POA: Diagnosis not present

## 2016-01-19 DIAGNOSIS — J440 Chronic obstructive pulmonary disease with acute lower respiratory infection: Secondary | ICD-10-CM | POA: Diagnosis not present

## 2016-01-19 DIAGNOSIS — I429 Cardiomyopathy, unspecified: Secondary | ICD-10-CM | POA: Diagnosis not present

## 2016-01-19 DIAGNOSIS — I10 Essential (primary) hypertension: Secondary | ICD-10-CM | POA: Diagnosis not present

## 2016-01-19 DIAGNOSIS — F329 Major depressive disorder, single episode, unspecified: Secondary | ICD-10-CM | POA: Diagnosis not present

## 2016-01-20 DIAGNOSIS — S42202D Unspecified fracture of upper end of left humerus, subsequent encounter for fracture with routine healing: Secondary | ICD-10-CM | POA: Diagnosis not present

## 2016-01-20 DIAGNOSIS — J440 Chronic obstructive pulmonary disease with acute lower respiratory infection: Secondary | ICD-10-CM | POA: Diagnosis not present

## 2016-01-20 DIAGNOSIS — I10 Essential (primary) hypertension: Secondary | ICD-10-CM | POA: Diagnosis not present

## 2016-01-20 DIAGNOSIS — I429 Cardiomyopathy, unspecified: Secondary | ICD-10-CM | POA: Diagnosis not present

## 2016-01-20 DIAGNOSIS — F329 Major depressive disorder, single episode, unspecified: Secondary | ICD-10-CM | POA: Diagnosis not present

## 2016-01-20 DIAGNOSIS — J189 Pneumonia, unspecified organism: Secondary | ICD-10-CM | POA: Diagnosis not present

## 2016-01-24 DIAGNOSIS — I10 Essential (primary) hypertension: Secondary | ICD-10-CM | POA: Diagnosis not present

## 2016-01-24 DIAGNOSIS — F329 Major depressive disorder, single episode, unspecified: Secondary | ICD-10-CM | POA: Diagnosis not present

## 2016-01-24 DIAGNOSIS — J189 Pneumonia, unspecified organism: Secondary | ICD-10-CM | POA: Diagnosis not present

## 2016-01-24 DIAGNOSIS — S42202D Unspecified fracture of upper end of left humerus, subsequent encounter for fracture with routine healing: Secondary | ICD-10-CM | POA: Diagnosis not present

## 2016-01-24 DIAGNOSIS — I429 Cardiomyopathy, unspecified: Secondary | ICD-10-CM | POA: Diagnosis not present

## 2016-01-24 DIAGNOSIS — J440 Chronic obstructive pulmonary disease with acute lower respiratory infection: Secondary | ICD-10-CM | POA: Diagnosis not present

## 2016-01-26 DIAGNOSIS — J440 Chronic obstructive pulmonary disease with acute lower respiratory infection: Secondary | ICD-10-CM | POA: Diagnosis not present

## 2016-01-26 DIAGNOSIS — I10 Essential (primary) hypertension: Secondary | ICD-10-CM | POA: Diagnosis not present

## 2016-01-26 DIAGNOSIS — S42202D Unspecified fracture of upper end of left humerus, subsequent encounter for fracture with routine healing: Secondary | ICD-10-CM | POA: Diagnosis not present

## 2016-01-26 DIAGNOSIS — I429 Cardiomyopathy, unspecified: Secondary | ICD-10-CM | POA: Diagnosis not present

## 2016-01-26 DIAGNOSIS — J189 Pneumonia, unspecified organism: Secondary | ICD-10-CM | POA: Diagnosis not present

## 2016-01-26 DIAGNOSIS — F329 Major depressive disorder, single episode, unspecified: Secondary | ICD-10-CM | POA: Diagnosis not present

## 2016-01-27 ENCOUNTER — Other Ambulatory Visit: Payer: Self-pay | Admitting: Family Medicine

## 2016-01-27 DIAGNOSIS — Z9012 Acquired absence of left breast and nipple: Secondary | ICD-10-CM

## 2016-01-27 DIAGNOSIS — Z1231 Encounter for screening mammogram for malignant neoplasm of breast: Secondary | ICD-10-CM

## 2016-02-04 DIAGNOSIS — F329 Major depressive disorder, single episode, unspecified: Secondary | ICD-10-CM | POA: Diagnosis not present

## 2016-02-04 DIAGNOSIS — I10 Essential (primary) hypertension: Secondary | ICD-10-CM | POA: Diagnosis not present

## 2016-02-04 DIAGNOSIS — I429 Cardiomyopathy, unspecified: Secondary | ICD-10-CM | POA: Diagnosis not present

## 2016-02-04 DIAGNOSIS — J189 Pneumonia, unspecified organism: Secondary | ICD-10-CM | POA: Diagnosis not present

## 2016-02-04 DIAGNOSIS — S42202D Unspecified fracture of upper end of left humerus, subsequent encounter for fracture with routine healing: Secondary | ICD-10-CM | POA: Diagnosis not present

## 2016-02-04 DIAGNOSIS — J440 Chronic obstructive pulmonary disease with acute lower respiratory infection: Secondary | ICD-10-CM | POA: Diagnosis not present

## 2016-02-07 ENCOUNTER — Ambulatory Visit: Payer: Federal, State, Local not specified - PPO

## 2016-03-14 ENCOUNTER — Other Ambulatory Visit: Payer: Self-pay | Admitting: *Deleted

## 2016-03-14 MED ORDER — FLUOXETINE HCL 10 MG PO CAPS: 10.0000 mg | ORAL_CAPSULE | Freq: Every day | ORAL | 2 refills | Status: DC

## 2016-03-14 NOTE — Telephone Encounter (Signed)
Refill request for 90 day supply.  Kiora Hallberg L, RN  

## 2016-03-29 ENCOUNTER — Ambulatory Visit
Admission: RE | Admit: 2016-03-29 | Discharge: 2016-03-29 | Disposition: A | Discharge status: Home / Self Care | Payer: Medicare Other | Source: Ambulatory Visit | Attending: Family Medicine | Admitting: Family Medicine

## 2016-03-29 DIAGNOSIS — Z1231 Encounter for screening mammogram for malignant neoplasm of breast: Secondary | ICD-10-CM

## 2016-03-29 DIAGNOSIS — Z9012 Acquired absence of left breast and nipple: Secondary | ICD-10-CM

## 2016-04-19 ENCOUNTER — Ambulatory Visit
Admission: RE | Admit: 2016-04-19 | Discharge: 2016-04-19 | Disposition: A | Discharge status: Home / Self Care | Payer: Federal, State, Local not specified - PPO | Source: Ambulatory Visit | Attending: Cardiovascular Disease | Admitting: Cardiovascular Disease

## 2016-04-19 ENCOUNTER — Encounter: Payer: Self-pay | Admitting: Cardiovascular Disease

## 2016-04-19 ENCOUNTER — Ambulatory Visit (INDEPENDENT_AMBULATORY_CARE_PROVIDER_SITE_OTHER): Payer: Medicare Other | Admitting: Cardiovascular Disease

## 2016-04-19 VITALS — BP 126/68 | HR 79 | Ht 65.0 in | Wt 135.0 lb

## 2016-04-19 DIAGNOSIS — R6889 Other general symptoms and signs: Secondary | ICD-10-CM | POA: Diagnosis not present

## 2016-04-19 DIAGNOSIS — I429 Cardiomyopathy, unspecified: Secondary | ICD-10-CM

## 2016-04-19 DIAGNOSIS — R0989 Other specified symptoms and signs involving the circulatory and respiratory systems: Secondary | ICD-10-CM

## 2016-04-19 DIAGNOSIS — I428 Other cardiomyopathies: Secondary | ICD-10-CM

## 2016-04-19 DIAGNOSIS — I5032 Chronic diastolic (congestive) heart failure: Secondary | ICD-10-CM | POA: Diagnosis not present

## 2016-04-19 DIAGNOSIS — Z95810 Presence of automatic (implantable) cardiac defibrillator: Secondary | ICD-10-CM | POA: Diagnosis not present

## 2016-04-19 NOTE — Progress Notes (Signed)
Cardiology Office Note    Date:  04/20/2016   ID:  Brianna [name], DOB 10-May-1930, MRN [id number]  PCP:  Donnamae Jude, MD  Cardiologist: Quay Burow, M.D.;  Sanda Klein, MD   Chief Complaint  Patient presents with  . Follow-up    History of Present Illness:  Brianna [name] is a 82 y.o. female returning in follow-up for her single chamber Medtronic protective VR defibrillator. She has nonischemic cardiomyopathy secondary to breast cancer chemotherapy 10 years ago. Her most recent echo showed an ejection fraction of 45-50 %. She has a chronic left bundle branch block and septal dyssynchrony. She last saw Dr. Gwenlyn Found in June. Just before that she had been hospitalized after a fall and fracture of her right shoulder.  She complains of a cough and shortness of breath even when just walking from one room to another in her house. She has had some wheezing, but probably no worse than usual. She denies fever or chills. She was hospitalized after a fall in June when she fractured her right humerus. Chest x-ray at that time showed an infiltrate in the right lung base. On exam today she has quite significant and persistent crackles in her right base that do not clear with cough. He denies pleuritic or anginal chest pain and has not had syncope or palpitations.  Interrogation of her defibrillator shows normal function. She had one episode of nonsustained VT consisting of only 7 beats at 207 bpm. She has had several episodes of paroxysmal atrial tachycardia at around 250 bpm, usually brief but at least on one occasion lasting up to 4 minutes and 23 seconds. She appeared to have particular frequent SVT in March. She has not had need for ventricular pacing.  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 Her left ventricular ejection fraction was as low as 30% when she received her single chamber defibrillator in 2006, but her most  recent LV ejection fraction was 45-50% by echo in June 2014. She has a history of a Medtronic Sprint fidelis lead fracture and now has a Chief Operating Officer. Her defibrillator is a Artist device implanted in 2011. She has never received tachycardia therapies from her device.  Past Medical History:  Diagnosis Date  . Automatic implantable cardiac defibrillator in situ   . Cancer ([place])    breast  . Depression   . Emphysema of lung ([place])   . Hyperlipidemia   . Hypertension   . Nonischemic cardiomyopathy ([place])   . Osteopenia     Past Surgical History:  Procedure Laterality Date  . APPENDECTOMY  1943  . BREAST SURGERY     mastectomy  . CARDIAC CATHETERIZATION  12/18/2002   Normal coronary arteries and normal LV function  . CARDIAC DEFIBRILLATOR PLACEMENT  10/13/2004   Medtronic Maximo VR, model P1342601, serial [id number]  . FEMUR IM NAIL Right 05/10/2014   Procedure: INTRAMEDULLARY (IM) NAIL FEMORAL;  Surgeon: Marianna Payment, MD;  Location: [facility];  Service: Orthopedics;  Laterality: Right;  . PACEMAKER INSERTION  06/17/2010   Medtronic Protecta VR, model #D334VRG, serial [id number]  . TONSILLECTOMY  1940    Current Medications: Outpatient Medications Prior to Visit  Medication Sig Dispense Refill  . ADVAIR DISKUS 100-50 MCG/DOSE AEPB INHALE 1 PUFF INTO THE LUNGS 2 (TWO) TIMES DAILY. 60 each 1  . albuterol (PROAIR HFA) 108 (90 BASE) MCG/ACT inhaler Inhale 2 puffs into the lungs every 6 (  six) hours as needed for wheezing or shortness of breath.    Marland Kitchen aspirin EC 81 MG tablet Take 81 mg by mouth daily.    Marland Kitchen atorvastatin (LIPITOR) 40 MG tablet Take 40 mg by mouth daily.    . Calcium Carbonate-Vitamin D (CALTRATE 600+D) 600-400 MG-UNIT per tablet Take 1 tablet by mouth daily.     . carvedilol (COREG) 3.125 MG tablet TAKE 1 TABLET (3.125 MG TOTAL) BY MOUTH 2 (TWO) TIMES DAILY WITH A MEAL. 60 tablet 11  . enalapril (VASOTEC) 2.5 MG tablet TAKE 2 TABLETS BY MOUTH DAILY 180  tablet 1  . feeding supplement, ENSURE COMPLETE, (ENSURE COMPLETE) LIQD Take 237 mLs by mouth 3 (three) times daily between meals. (Patient taking differently: Take 237 mLs by mouth daily. ) 6 Bottle 1  . FLUoxetine (PROZAC) 10 MG capsule Take 1 capsule (10 mg total) by mouth daily. 30 capsule 2  . meclizine (ANTIVERT) 12.5 MG tablet TAKE 1 TABLET (12.5 MG TOTAL) BY MOUTH 3 (THREE) TIMES DAILY. 30 tablet 0  . Multiple Vitamin (MULTI-VITAMIN DAILY PO) Take 1 tablet by mouth at bedtime.     . cefUROXime (CEFTIN) 500 MG tablet Take 1 tablet (500 mg total) by mouth 2 (two) times daily with a meal. 6 tablet 0   No facility-administered medications prior to visit.      Allergies:   Atorvastatin; Claritin [loratadine]; and Crestor [rosuvastatin]   Social History   Social History  . Marital status: Widowed    Spouse name: N/A  . Number of children: 2  . Years of education: 11   Occupational History  . Retired-Manager Writer. Retired   Social History Main Topics  . Smoking status: Light Tobacco Smoker    Types: Cigarettes  . Smokeless tobacco: Never Used     Comment: Pt reports smoking about 1-2 times a week when stressed.   . Alcohol use 0.6 oz/week    1 Glasses of wine per week  . Drug use: No  . Sexual activity: No   Other Topics Concern  . None   Social History Narrative   Health Care POA:    Emergency Contact: friend, Trevor Iha, [phone]   End of Life Plan:    Who lives with you: self, has medical alert button    Any pets: none   Diet: Patient has a varied diet and controls portions well.   Exercise: Patient does not have any regular exercise routine.   Seatbelts: Patient reports wearing seatbelt when in vehicle.   Nancy Fetter Exposure/Protection: Patient reports wearing sun screening daily.   Hobbies: clothes alterations                 Family History:  The patient's family history includes Cancer in her daughter; Heart disease in her brother, father, and mother;  Hyperlipidemia in her sister.   ROS:   Please see the history of present illness.    ROS All other systems reviewed and are negative.   PHYSICAL EXAM:   VS:  BP 126/68   Pulse 79   Ht 5\' 5"  (1.651 m)   Wt 61.2 kg (135 lb)   BMI 22.47 kg/m    GEN: Well nourished, well developed, in no acute distress  HEENT: normal  Neck: no JVD, carotid bruits, or masses Cardiac: Paradoxically split second heart sound, RRR; no murmurs, rubs, or gallops,no edema , healthy subclavian defibrillator site Respiratory:  Dry crackles that do not clear with cough in her right base almond  no wheezing, normal work of breathing GI: soft, nontender, nondistended, + BS MS: no deformity or atrophy  Skin: warm and dry, no rash Neuro:  Alert and Oriented x 3, Strength and sensation are intact Psych: euthymic mood, full affect  Wt Readings from Last 3 Encounters:  04/19/16 61.2 kg (135 lb)  01/13/16 57.7 kg (127 lb 3.2 oz)  12/28/15 59.8 kg (131 lb 13.4 oz)      Studies/Labs Reviewed:   EKG:  EKG is not ordered today.  The ekg ordered 01/13/16 demonstrates NSR, LBBB  Recent Labs: 12/29/2015: ALT 11 12/30/2015: BUN 15; Creatinine, Ser 0.89; Hemoglobin 9.8; Platelets 274; Potassium 4.3; Sodium 136   Lipid Panel    Component Value Date/Time   CHOL 170 12/22/2014 0945   TRIG 59 12/22/2014 0945   HDL 81 12/22/2014 0945   CHOLHDL 2.1 12/22/2014 0945   VLDL 12 12/22/2014 0945   LDLCALC 77 12/22/2014 0945   LDLDIRECT 147 01/27/2008    ASSESSMENT:    1. Lung crackles   2. Abnormal exam or test finding   3. Chronic diastolic heart failure ([place])   4. Nonischemic cardiomyopathy ([place])   5. Automatic implantable cardioverter-defibrillator in situ      PLAN:  In order of problems listed above:  1. Dyspnea: Prominent unilateral crackles in her right lung base suggestive primary lung problem. Her weight has increased substantially since June but she does not have overt signs of fluid overload on physical  exam. We'll repeat a chest x-ray 2. CHF: Other than weight gain, there are no signs of volume overload. Unfortunately her device does not have optivol enabled. 3. ICD: Normal device function, continue modality for 3 months and yearly office visit 4. PAT: Episodes are sometimes sustained but infrequent and asymptomatic 5. NSVT: Episodes are very brief and very infrequent, have not required device intervention.    Medication Adjustments/Labs and Tests Ordered: Current medicines are reviewed at length with the patient today.  Concerns regarding medicines are outlined above.  Medication changes, Labs and Tests ordered today are listed in the Patient Instructions below. Patient Instructions  Dr Sallyanne Kuster recommends that you continue on your current medications as directed. Please refer to the Current Medication list given to you today.  Your physician recommends that you have a chest x-ray. This has been ordered to be completed at [facility]. A chest x-ray takes a picture of the organs and structures inside the chest, including the heart, lungs, and blood vessels. This test can show several things, including, whether the heart is enlarges; whether fluid is building up in the lungs; and whether pacemaker / defibrillator leads are still in place.  Remote monitoring is used to monitor your Pacemaker of ICD from home. This monitoring reduces the number of office visits required to check your device to one time per year. It allows [place] to keep an eye on the functioning of your device to ensure it is working properly. You are scheduled for a device check from home on Wednesday, December 27th, 2017. You may send your transmission at any time that day. If you have a wireless device, the transmission will be sent automatically. After your physician reviews your transmission, you will receive a postcard with your next transmission date.  Dr Sallyanne Kuster recommends that you schedule a follow-up appointment in 12  months with a device check. You will receive a reminder letter in the mail two months in advance. If you don't receive a letter, please call our office to schedule  the follow-up appointment.  If you need a refill on your cardiac medications before your next appointment, please call your pharmacy.    Signed, Sanda Klein, MD  04/20/2016 2:12 PM    [facility] [place], [place], [place]  [address] Phone: [phone]; Fax: [phone]

## 2016-04-19 NOTE — Patient Instructions (Signed)
Dr Sallyanne Kuster recommends that you continue on your current medications as directed. Please refer to the Current Medication list given to you today.  Your physician recommends that you have a chest x-ray. This has been ordered to be completed at [facility]. A chest x-ray takes a picture of the organs and structures inside the chest, including the heart, lungs, and blood vessels. This test can show several things, including, whether the heart is enlarges; whether fluid is building up in the lungs; and whether pacemaker / defibrillator leads are still in place.  Remote monitoring is used to monitor your Pacemaker of ICD from home. This monitoring reduces the number of office visits required to check your device to one time per year. It allows [place] to keep an eye on the functioning of your device to ensure it is working properly. You are scheduled for a device check from home on Wednesday, December 27th, 2017. You may send your transmission at any time that day. If you have a wireless device, the transmission will be sent automatically. After your physician reviews your transmission, you will receive a postcard with your next transmission date.  Dr Sallyanne Kuster recommends that you schedule a follow-up appointment in 12 months with a device check. You will receive a reminder letter in the mail two months in advance. If you don't receive a letter, please call our office to schedule the follow-up appointment.  If you need a refill on your cardiac medications before your next appointment, please call your pharmacy.

## 2016-04-20 ENCOUNTER — Telehealth: Payer: Self-pay | Admitting: Cardiology

## 2016-04-20 NOTE — Telephone Encounter (Signed)
-----   Message from Sanda Klein, MD sent at 04/20/2016  2:12 PM EDT ----- Can we please set this patient up for home downloads? If this is not possible she needs an in office device clinic check at [facility] in 3 months. Thanks  [facility]

## 2016-04-20 NOTE — Telephone Encounter (Signed)
Spoke w/ pt and she is going to come into the office tomorrow Friday 04-21-16 so I can show her how to use her home monitor.

## 2016-04-26 LAB — CUP PACEART INCLINIC DEVICE CHECK
Battery Voltage: 2.96 V
HighPow Impedance: 342 Ohm
HighPow Impedance: 72 Ohm
Implantable Lead Implant Date: 20111125
Implantable Lead Location: 753860
Implantable Lead Model: 7122
Lead Channel Impedance Value: 418 Ohm
Lead Channel Sensing Intrinsic Amplitude: 11.625 mV
MDC IDC MSMT LEADCHNL RV SENSING INTR AMPL: 11.625 mV
MDC IDC SESS DTM: 20170927171608
MDC IDC SET LEADCHNL RV PACING AMPLITUDE: 2 V
MDC IDC SET LEADCHNL RV PACING PULSEWIDTH: 0.4 ms
MDC IDC SET LEADCHNL RV SENSING SENSITIVITY: 0.45 mV
MDC IDC STAT BRADY RV PERCENT PACED: 0 %

## 2016-05-10 ENCOUNTER — Other Ambulatory Visit: Payer: Self-pay | Admitting: Cardiovascular Disease

## 2016-05-19 ENCOUNTER — Other Ambulatory Visit: Payer: Self-pay | Admitting: Cardiovascular Disease

## 2016-05-23 ENCOUNTER — Other Ambulatory Visit: Payer: Self-pay | Admitting: Family Medicine

## 2016-06-28 ENCOUNTER — Other Ambulatory Visit: Payer: Self-pay | Admitting: Family Medicine

## 2016-06-30 ENCOUNTER — Other Ambulatory Visit: Payer: Self-pay

## 2016-07-02 ENCOUNTER — Other Ambulatory Visit: Payer: Self-pay | Admitting: Cardiovascular Disease

## 2016-07-03 NOTE — Telephone Encounter (Signed)
Rx(s) sent to pharmacy electronically.  

## 2016-07-19 ENCOUNTER — Ambulatory Visit (INDEPENDENT_AMBULATORY_CARE_PROVIDER_SITE_OTHER): Payer: Medicare Other | Admitting: *Deleted

## 2016-07-19 ENCOUNTER — Telehealth: Payer: Self-pay | Admitting: Cardiology

## 2016-07-19 DIAGNOSIS — I428 Other cardiomyopathies: Secondary | ICD-10-CM

## 2016-07-19 NOTE — Telephone Encounter (Signed)
Spoke with pt and reminded pt of remote transmission that is due today. Pt verbalized understanding.   

## 2016-07-20 NOTE — Progress Notes (Signed)
Remote ICD transmission.   

## 2016-07-21 ENCOUNTER — Encounter: Payer: Self-pay | Admitting: Cardiology

## 2016-07-21 LAB — CUP PACEART REMOTE DEVICE CHECK
Brady Statistic RV Percent Paced: 0 %
HighPow Impedance: 342 Ohm
HighPow Impedance: 69 Ohm
Implantable Lead Implant Date: 20111125
Implantable Lead Location: 753860
Implantable Lead Model: 7122
Lead Channel Impedance Value: 418 Ohm
Lead Channel Sensing Intrinsic Amplitude: 10.875 mV
Lead Channel Setting Sensing Sensitivity: 0.45 mV
MDC IDC MSMT BATTERY VOLTAGE: 2.9 V
MDC IDC MSMT LEADCHNL RV SENSING INTR AMPL: 10.875 mV
MDC IDC PG IMPLANT DT: 20111125
MDC IDC SESS DTM: 20171227201802
MDC IDC SET LEADCHNL RV PACING AMPLITUDE: 2 V
MDC IDC SET LEADCHNL RV PACING PULSEWIDTH: 0.4 ms

## 2016-07-30 ENCOUNTER — Other Ambulatory Visit: Payer: Self-pay | Admitting: Family Medicine

## 2016-09-20 ENCOUNTER — Other Ambulatory Visit: Payer: Self-pay | Admitting: Family Medicine

## 2016-10-08 ENCOUNTER — Other Ambulatory Visit: Payer: Self-pay | Admitting: Family Medicine

## 2016-10-18 ENCOUNTER — Ambulatory Visit (INDEPENDENT_AMBULATORY_CARE_PROVIDER_SITE_OTHER): Payer: Medicare Other | Admitting: *Deleted

## 2016-10-18 DIAGNOSIS — I428 Other cardiomyopathies: Secondary | ICD-10-CM

## 2016-10-19 LAB — CUP PACEART REMOTE DEVICE CHECK
Date Time Interrogation Session: 20180328073429
HIGH POWER IMPEDANCE MEASURED VALUE: 304 Ohm
HIGH POWER IMPEDANCE MEASURED VALUE: 74 Ohm
Lead Channel Sensing Intrinsic Amplitude: 12.75 mV
Lead Channel Sensing Intrinsic Amplitude: 12.75 mV
Lead Channel Setting Pacing Amplitude: 2 V
Lead Channel Setting Pacing Pulse Width: 0.4 ms
MDC IDC LEAD IMPLANT DT: 20111125
MDC IDC LEAD LOCATION: 753860
MDC IDC MSMT BATTERY VOLTAGE: 2.88 V
MDC IDC MSMT LEADCHNL RV IMPEDANCE VALUE: 399 Ohm
MDC IDC PG IMPLANT DT: 20111125
MDC IDC SET LEADCHNL RV SENSING SENSITIVITY: 0.45 mV
MDC IDC STAT BRADY RV PERCENT PACED: 0.03 %

## 2016-10-19 NOTE — Progress Notes (Signed)
Remote ICD transmission.   

## 2016-10-20 ENCOUNTER — Encounter: Payer: Self-pay | Admitting: Cardiology

## 2016-11-09 ENCOUNTER — Other Ambulatory Visit: Payer: Self-pay | Admitting: Cardiovascular Disease

## 2016-11-09 NOTE — Telephone Encounter (Signed)
REFILL 

## 2016-12-01 ENCOUNTER — Ambulatory Visit (INDEPENDENT_AMBULATORY_CARE_PROVIDER_SITE_OTHER): Payer: Medicare Other

## 2016-12-01 ENCOUNTER — Ambulatory Visit (INDEPENDENT_AMBULATORY_CARE_PROVIDER_SITE_OTHER): Payer: Medicare Other | Admitting: Physician Assistant

## 2016-12-01 ENCOUNTER — Encounter: Payer: Self-pay | Admitting: Physician Assistant

## 2016-12-01 VITALS — BP 108/68 | HR 99 | Temp 97.4°F | Resp 16 | Ht 62.5 in | Wt 137.6 lb

## 2016-12-01 DIAGNOSIS — J45901 Unspecified asthma with (acute) exacerbation: Secondary | ICD-10-CM | POA: Diagnosis not present

## 2016-12-01 DIAGNOSIS — R05 Cough: Secondary | ICD-10-CM | POA: Diagnosis not present

## 2016-12-01 DIAGNOSIS — R062 Wheezing: Secondary | ICD-10-CM | POA: Diagnosis not present

## 2016-12-01 DIAGNOSIS — H6123 Impacted cerumen, bilateral: Secondary | ICD-10-CM | POA: Diagnosis not present

## 2016-12-01 MED ORDER — ALBUTEROL SULFATE HFA 108 (90 BASE) MCG/ACT IN AERS: 2.0000 | INHALATION_SPRAY | Freq: Four times a day (QID) | RESPIRATORY_TRACT | 2 refills | Status: AC | PRN

## 2016-12-01 MED ORDER — CETIRIZINE HCL 5 MG PO TABS: 5.0000 mg | ORAL_TABLET | Freq: Every day | ORAL | 11 refills | Status: DC

## 2016-12-01 MED ORDER — BENZONATATE 100 MG PO CAPS: 100.0000 mg | ORAL_CAPSULE | Freq: Three times a day (TID) | ORAL | 0 refills | Status: DC | PRN

## 2016-12-01 MED ORDER — LEVALBUTEROL HCL 1.25 MG/0.5ML IN NEBU
1.2500 mg | INHALATION_SOLUTION | Freq: Once | RESPIRATORY_TRACT | Status: AC
  Administered 2016-12-01: 1.25 mg via RESPIRATORY_TRACT

## 2016-12-01 MED ORDER — ALBUTEROL SULFATE (2.5 MG/3ML) 0.083% IN NEBU
2.5000 mg | INHALATION_SOLUTION | Freq: Once | RESPIRATORY_TRACT | Status: DC
Start: 2016-12-01 — End: 2016-12-01

## 2016-12-01 MED ORDER — PREDNISONE 20 MG PO TABS: ORAL_TABLET | ORAL | 0 refills | Status: DC

## 2016-12-01 MED ORDER — DOXYCYCLINE HYCLATE 100 MG PO CAPS: 100.0000 mg | ORAL_CAPSULE | Freq: Two times a day (BID) | ORAL | 0 refills | Status: AC

## 2016-12-01 MED ORDER — IPRATROPIUM BROMIDE 0.02 % IN SOLN
0.5000 mg | Freq: Once | RESPIRATORY_TRACT | Status: AC
  Administered 2016-12-01: 0.5 mg via RESPIRATORY_TRACT

## 2016-12-01 NOTE — Progress Notes (Signed)
PRIMARY CARE AT [place], [place] [address] [phone]  Date:  12/01/2016   Name:  Brianna [name]   DOB:  [birth year]   MRN:  [id number]  PCP:  Donnamae Jude, MD    History of Present Illness:  Brianna [name] is a 83 y.o. female patient who presents to PCP with  Chief Complaint  Patient presents with  . Cough    X 2 WEEKS WITH WHEEZING     --patient is here today with a nurse aid for cc of cough.  Nurse aide reports that the patient has been coughing for about 2 weeks.  They have not noticed any fever.  Cough is non-productive.  She has been wheezing.  They have not used an inhaler with her sxs of copd.  She has not been on oxygen.  No chest pains.  There is some congestion.  She has some sneezing.  Allergies.  She is taking nothing for allergies.  Patient Active Problem List   Diagnosis Date Noted  . Fracture of right humerus 12/29/2015  . Syncope 12/28/2015  . Acute blood loss anemia 06/17/2014  . COPD (chronic obstructive pulmonary disease) ([place]) 06/10/2014  . Asthma, chronic 05/19/2014  . Constipation 05/19/2014  . Major depression, chronic 05/19/2014  . Protein-calorie malnutrition, severe ([place]) 05/12/2014  . Hip fracture, intertrochanteric ([place]) 05/10/2014  . Nonischemic cardiomyopathy ([place]) 03/24/2014  . Dyspnea 08/03/2013  . Automatic implantable cardioverter-defibrillator in situ 04/16/2013  . CYSTOCELE WITHOUT MENTION UTERINE PROLAPSE LAT 10/12/2008  . BREAST CANCER, HX OF 11/05/2007  . INSOMNIA-SLEEP DISORDER-UNSPEC 07/10/2007  . HYPERCHOLESTEROLEMIA 09/20/2006  . HYPERTENSION, BENIGN SYSTEMIC 09/20/2006  . VENTRICULAR TACHYCARDIA 09/20/2006  . Chronic diastolic heart failure ([place]) 09/20/2006  . PSORIASIS 09/20/2006  . Osteoporosis 09/20/2006    Past Medical History:  Diagnosis Date  . Automatic implantable cardiac defibrillator in situ   . Cancer ([place])    breast  . Depression   . Emphysema of lung ([place])   . Hyperlipidemia   .  Hypertension   . Nonischemic cardiomyopathy ([place])   . Osteopenia     Past Surgical History:  Procedure Laterality Date  . APPENDECTOMY  1943  . BREAST SURGERY     mastectomy  . CARDIAC CATHETERIZATION  12/18/2002   Normal coronary arteries and normal LV function  . CARDIAC DEFIBRILLATOR PLACEMENT  10/13/2004   Medtronic Maximo VR, model Q7220614, serial [id number]  . FEMUR IM NAIL Right 05/10/2014   Procedure: INTRAMEDULLARY (IM) NAIL FEMORAL;  Surgeon: Marianna Payment, MD;  Location: [facility];  Service: Orthopedics;  Laterality: Right;  . PACEMAKER INSERTION  06/17/2010   Medtronic [place] VR, model #D334VRG, serial #[id number]  . TONSILLECTOMY  1940    Social History  Substance Use Topics  . Smoking status: Light Tobacco Smoker    Types: Cigarettes  . Smokeless tobacco: Never Used     Comment: Pt reports smoking about 1-2 times a week when stressed.   . Alcohol use 0.6 oz/week    1 Glasses of wine per week    Family History  Problem Relation Age of Onset  . Heart disease Mother        Pacemaker  . Heart disease Father        No details.    . Hyperlipidemia Sister   . Heart disease Brother        "Heart Attacks"  Died age 62  . Cancer Daughter        Oral cancer and "  knot" on right cheek that was cancer    Allergies  Allergen Reactions  . Atorvastatin     Fatigue   . Claritin [Loratadine] Other (See Comments)    fatigue  . Crestor [Rosuvastatin]     Myalgias. Pt is tolerating Lipitor 20mg  QOD well.    Medication list has been reviewed and updated.  Current Outpatient Prescriptions on File Prior to Visit  Medication Sig Dispense Refill  . ADVAIR DISKUS 100-50 MCG/DOSE AEPB INHALE 1 PUFF INTO THE LUNGS 2 (TWO) TIMES DAILY. 60 each 1  . aspirin EC 81 MG tablet Take 81 mg by mouth daily.    . carvedilol (COREG) 3.125 MG tablet TAKE 1 TABLET (3.125 MG TOTAL) BY MOUTH 2 (TWO) TIMES DAILY WITH A MEAL. 60 tablet 10  . enalapril (VASOTEC) 2.5 MG tablet TAKE 2  TABLETS BY MOUTH DAILY 180 tablet 2  . feeding supplement, ENSURE COMPLETE, (ENSURE COMPLETE) LIQD Take 237 mLs by mouth 3 (three) times daily between meals. (Patient taking differently: Take 237 mLs by mouth daily. ) 6 Bottle 1  . FLUoxetine (PROZAC) 10 MG capsule TAKE 1 CAPSULE (10 MG TOTAL) BY MOUTH DAILY. 30 capsule 2  . furosemide (LASIX) 20 MG tablet Take 1 tablet (20 mg total) by mouth daily. KEEP APPT 90 tablet 0  . albuterol (PROAIR HFA) 108 (90 BASE) MCG/ACT inhaler Inhale 2 puffs into the lungs every 6 (six) hours as needed for wheezing or shortness of breath.    Marland Kitchen atorvastatin (LIPITOR) 40 MG tablet Take 40 mg by mouth daily.    . Calcium Carbonate-Vitamin D (CALTRATE 600+D) 600-400 MG-UNIT per tablet Take 1 tablet by mouth daily.     . meclizine (ANTIVERT) 12.5 MG tablet TAKE 1 TABLET (12.5 MG TOTAL) BY MOUTH 3 (THREE) TIMES DAILY. (Patient not taking: Reported on 12/01/2016) 30 tablet 0  . Multiple Vitamin (MULTI-VITAMIN DAILY PO) Take 1 tablet by mouth at bedtime.      No current facility-administered medications on file prior to visit.     ROS ROS otherwise unremarkable unless listed above.  Physical Examination: BP 108/68 (BP Location: Right Arm, Patient Position: Sitting, Cuff Size: Normal)   Pulse 99   Temp 97.4 F (36.3 C) (Oral)   Resp 16   Ht 5' 2.5" (1.588 m)   Wt 137 lb 9.6 oz (62.4 kg)   SpO2 92%   BMI 24.77 kg/m  Ideal Body Weight: Weight in (lb) to have BMI = 25: 138.6  Physical Exam  Constitutional: She is oriented to person, place, and time. She appears well-developed and well-nourished. No distress.  HENT:  Head: Normocephalic and atraumatic.  Right Ear: Tympanic membrane, external ear and ear canal normal.  Left Ear: Tympanic membrane, external ear and ear canal normal.  Nose: Mucosal edema and rhinorrhea present. Right sinus exhibits no maxillary sinus tenderness and no frontal sinus tenderness. Left sinus exhibits no maxillary sinus tenderness and no  frontal sinus tenderness.  Mouth/Throat: No uvula swelling. No oropharyngeal exudate, posterior oropharyngeal edema or posterior oropharyngeal erythema.  Eyes: Conjunctivae and EOM are normal. Pupils are equal, round, and reactive to light.  Cardiovascular: Normal rate and regular rhythm.  Exam reveals no gallop, no distant heart sounds and no friction rub.   No murmur heard. Pulmonary/Chest: Effort normal. No accessory muscle usage. No apnea. No respiratory distress. She has no decreased breath sounds. She has wheezes (inspiratory/expiratory wheezing). She has no rhonchi.  Lymphadenopathy:       Head (right side): No  submandibular, no tonsillar, no preauricular and no posterior auricular adenopathy present.       Head (left side): No submandibular, no tonsillar, no preauricular and no posterior auricular adenopathy present.  Neurological: She is alert and oriented to person, place, and time.  Skin: She is not diaphoretic.  Psychiatric: She has a normal mood and affect. Her behavior is normal.     Assessment and Plan: Brianna [name] is a 83 y.o. female who is here today for cc of cough and wheezing.   Relief of wheezing following nebulizing treatment.  I have ambulated with her and she can maintain >93% throughout.  Will treat with doxy, short prednisone, and allergy medicine.  Advised to follow up in 5 days.  Moderate asthma with acute exacerbation, unspecified whether persistent - Plan: doxycycline (VIBRAMYCIN) 100 MG capsule, predniSONE (DELTASONE) 20 MG tablet, cetirizine (ZYRTEC) 5 MG tablet  Wheezing - Plan: ipratropium (ATROVENT) nebulizer solution 0.5 mg, levalbuterol (XOPENEX) nebulizer solution 1.25 mg, DG Chest 2 View, Ear wax removal, DISCONTINUED: albuterol (PROVENTIL) (2.5 MG/3ML) 0.083% nebulizer solution 2.5 mg  Bilateral impacted cerumen - Plan: Ear wax removal  Ivar Drape, PA-C [facility] 5/13/20187:55 AM

## 2016-12-01 NOTE — Patient Instructions (Addendum)
Please make sure she is hydrating appropriately Take medication as prescribed She can continue taking mucinex maximum strength daily   Bronchospasm, Adult Bronchospasm is when airways in the lungs get smaller. When this happens, it can be hard to breathe. You may cough. You may also make a whistling sound when you breathe (wheeze). Follow these instructions at home: Medicines   Take over-the-counter and prescription medicines only as told by your doctor.  If you need to use an inhaler or nebulizer to take your medicine, ask your doctor how to use it.  If you were given a spacer, always use it with your inhaler. Lifestyle   Change your heating and air conditioning filter. Do this at least once a month.  Try not to use fireplaces and wood stoves.  Do not  smoke. Do not  allow smoking in your home.  Try not to use things that have a strong smell, like perfume.  Get rid of pests (such as roaches and mice) and their poop.  Remove any mold from your home.  Keep your house clean. Get rid of dust.  Use cleaning products that have no smell.  Replace carpet with wood, tile, or vinyl flooring.  Use allergy-proof pillows, mattress covers, and box spring covers.  Wash bed sheets and blankets every week. Use hot water. Dry them in a dryer.  Use blankets that are made of polyester or cotton.  Wash your hands often.  Keep pets out of your bedroom.  When you exercise, try not to breathe in cold air. General instructions   Have a plan for getting medical care. Know these things:  When to call your doctor.  When to call local emergency services (911 in the [place]  Where to go in an emergency.  Stay up to date on your shots (immunizations).  When you have an episode:  Stay calm.  Relax.  Breathe slowly. Contact a doctor if:  Your muscles ache.  Your chest hurts.  The color of the mucus you cough up (sputum) changes from clear or white to yellow, green, gray, or  bloody.  The mucus you cough up gets thicker.  You have a fever. Get help right away if:  The whistling sound gets worse, even after you take your medicines.  Your coughing gets worse.  You find it even harder to breathe.  Your chest hurts very much. Summary  Bronchospasm is when airways in the lungs get smaller.  When this happens, it can be hard to breathe. You may cough. You may also make a whistling sound when you breathe.  Stay away from things that cause you to have episodes. These include smoke or dust. This information is not intended to replace advice given to you by your health care provider. Make sure you discuss any questions you have with your health care provider. Document Released: 05/07/2009 Document Revised: 07/13/2016 Document Reviewed: 07/13/2016 Elsevier Interactive Patient Education  2017 [employer].    IF you received an x-ray today, you will receive an invoice from [facility]. Please contact [facility] at [phone] with questions or concerns regarding your invoice.   IF you received labwork today, you will receive an invoice from [facility]. Please contact [facility] at [phone] with questions or concerns regarding your invoice.   Our billing staff will not be able to assist you with questions regarding bills from these companies.  You will be contacted with the lab results as soon as they are available. The fastest way to get  your results is to activate your My Chart account. Instructions are located on the last page of this paperwork. If you have not heard from [place] regarding the results in 2 weeks, please contact this office.

## 2016-12-05 ENCOUNTER — Ambulatory Visit (INDEPENDENT_AMBULATORY_CARE_PROVIDER_SITE_OTHER): Payer: Medicare Other | Admitting: Physician Assistant

## 2016-12-05 VITALS — BP 133/74 | HR 89 | Temp 98.2°F | Resp 16 | Ht 62.5 in | Wt 138.4 lb

## 2016-12-05 DIAGNOSIS — R058 Other specified cough: Secondary | ICD-10-CM

## 2016-12-05 DIAGNOSIS — J441 Chronic obstructive pulmonary disease with (acute) exacerbation: Secondary | ICD-10-CM | POA: Diagnosis not present

## 2016-12-05 DIAGNOSIS — R05 Cough: Secondary | ICD-10-CM | POA: Diagnosis not present

## 2016-12-05 NOTE — Patient Instructions (Addendum)
  Please advise daughter that we need to make sure that she is taking the antibiotic and the steroid.  I attempted to contact her today, to make sure that we are all on same page.  Contact me if questions. I am placing a referral for home health to come and evaluate medications, and oxygen needs.  We may need a referral from pulmonologist, or pcp to do this.  Would like more information on this by caregiver.   IF you received an x-ray today, you will receive an invoice from [facility]. Please contact [facility] at [phone] with questions or concerns regarding your invoice.   IF you received labwork today, you will receive an invoice from [facility]. Please contact [facility] at [phone] with questions or concerns regarding your invoice.   Our billing staff will not be able to assist you with questions regarding bills from these companies.  You will be contacted with the lab results as soon as they are available. The fastest way to get your results is to activate your My Chart account. Instructions are located on the last page of this paperwork. If you have not heard from [place] regarding the results in 2 weeks, please contact this office.

## 2016-12-06 ENCOUNTER — Encounter: Payer: Self-pay | Admitting: Physician Assistant

## 2016-12-06 NOTE — Progress Notes (Signed)
PRIMARY CARE AT [place], [place] [address] [phone]  Date:  12/05/2016   Name:  Brianna [name]   DOB:  03/19/1930   MRN:  [id number]  PCP:  Donnamae Jude, MD    History of Present Illness:  Brianna [name] is a 83 y.o. female patient who presents to PCP with  Chief Complaint  Patient presents with  . Hearing Problem    Can't hear in left ear   . Follow-up    Asthma      Patient is here today for follow up of copd exacerbation.  She was seen here 4 days ago with wheezing and cough.  Given doxycycline, advair, and albuterol as needed.  She is here today with another daytime hired caregiver.  The patient is hard of hearing, and allows the caregiver to discuss.  Caregiver states that she noticed no improvement or decline of the patient.  She states that she is taking about 10 medicaiton sin the morning.  Though she does not know what she is taking as, the daughter distributes the medications daily.   Patient reports that she feels a little bit better.  Continues to have some wheezing and coughing (non-productive).  No fevers that they can report.    Patient Active Problem List   Diagnosis Date Noted  . Fracture of right humerus 12/29/2015  . Syncope 12/28/2015  . Acute blood loss anemia 06/17/2014  . COPD (chronic obstructive pulmonary disease) ([place]) 06/10/2014  . Asthma, chronic 05/19/2014  . Constipation 05/19/2014  . Major depression, chronic 05/19/2014  . Protein-calorie malnutrition, severe ([place]) 05/12/2014  . Hip fracture, intertrochanteric ([place]) 05/10/2014  . Nonischemic cardiomyopathy ([place]) 03/24/2014  . Dyspnea 08/03/2013  . Automatic implantable cardioverter-defibrillator in situ 04/16/2013  . CYSTOCELE WITHOUT MENTION UTERINE PROLAPSE LAT 10/12/2008  . BREAST CANCER, HX OF 11/05/2007  . INSOMNIA-SLEEP DISORDER-UNSPEC 07/10/2007  . HYPERCHOLESTEROLEMIA 09/20/2006  . HYPERTENSION, BENIGN SYSTEMIC 09/20/2006  . VENTRICULAR TACHYCARDIA 09/20/2006   . Chronic diastolic heart failure ([place]) 09/20/2006  . PSORIASIS 09/20/2006  . Osteoporosis 09/20/2006    Past Medical History:  Diagnosis Date  . Automatic implantable cardiac defibrillator in situ   . Cancer ([place])    breast  . Depression   . Emphysema of lung ([place])   . Hyperlipidemia   . Hypertension   . Nonischemic cardiomyopathy ([place])   . Osteopenia     Past Surgical History:  Procedure Laterality Date  . APPENDECTOMY  1943  . BREAST SURGERY     mastectomy  . CARDIAC CATHETERIZATION  12/18/2002   Normal coronary arteries and normal LV function  . CARDIAC DEFIBRILLATOR PLACEMENT  10/13/2004   Medtronic Maximo VR, model [id number], serial [id number]  . FEMUR IM NAIL Right 05/10/2014   Procedure: INTRAMEDULLARY (IM) NAIL FEMORAL;  Surgeon: Marianna Payment, MD;  Location: [facility];  Service: Orthopedics;  Laterality: Right;  . PACEMAKER INSERTION  06/17/2010   Medtronic [place] VR, model #D334VRG, serial #[id number]  . TONSILLECTOMY  1940    Social History  Substance Use Topics  . Smoking status: Light Tobacco Smoker    Types: Cigarettes  . Smokeless tobacco: Never Used     Comment: Pt reports smoking about 1-2 times a week when stressed.   . Alcohol use 0.6 oz/week    1 Glasses of wine per week    Family History  Problem Relation Age of Onset  . Heart disease Mother        Pacemaker  .  Heart disease Father        No details.    . Hyperlipidemia Sister   . Heart disease Brother        "Heart Attacks"  Died age 52  . Cancer Daughter        Oral cancer and "knot" on right cheek that was cancer    Allergies  Allergen Reactions  . Atorvastatin     Fatigue   . Claritin [Loratadine] Other (See Comments)    fatigue  . Crestor [Rosuvastatin]     Myalgias. Pt is tolerating Lipitor 20mg  QOD well.    Medication list has been reviewed and updated.  Current Outpatient Prescriptions on File Prior to Visit  Medication Sig Dispense Refill  . ADVAIR DISKUS 100-50  MCG/DOSE AEPB INHALE 1 PUFF INTO THE LUNGS 2 (TWO) TIMES DAILY. 60 each 1  . albuterol (PROAIR HFA) 108 (90 Base) MCG/ACT inhaler Inhale 2 puffs into the lungs every 6 (six) hours as needed for wheezing or shortness of breath. 8 g 2  . aspirin EC 81 MG tablet Take 81 mg by mouth daily.    Marland Kitchen atorvastatin (LIPITOR) 40 MG tablet Take 40 mg by mouth daily.    . benzonatate (TESSALON) 100 MG capsule Take 1-2 capsules (100-200 mg total) by mouth 3 (three) times daily as needed for cough. 40 capsule 0  . Calcium Carbonate-Vitamin D (CALTRATE 600+D) 600-400 MG-UNIT per tablet Take 1 tablet by mouth daily.     . carvedilol (COREG) 3.125 MG tablet TAKE 1 TABLET (3.125 MG TOTAL) BY MOUTH 2 (TWO) TIMES DAILY WITH A MEAL. 60 tablet 10  . cetirizine (ZYRTEC) 5 MG tablet Take 1 tablet (5 mg total) by mouth daily. 30 tablet 11  . doxycycline (VIBRAMYCIN) 100 MG capsule Take 1 capsule (100 mg total) by mouth 2 (two) times daily. 20 capsule 0  . enalapril (VASOTEC) 2.5 MG tablet TAKE 2 TABLETS BY MOUTH DAILY 180 tablet 2  . feeding supplement, ENSURE COMPLETE, (ENSURE COMPLETE) LIQD Take 237 mLs by mouth 3 (three) times daily between meals. (Patient taking differently: Take 237 mLs by mouth daily. ) 6 Bottle 1  . FLUoxetine (PROZAC) 10 MG capsule TAKE 1 CAPSULE (10 MG TOTAL) BY MOUTH DAILY. 30 capsule 2  . furosemide (LASIX) 20 MG tablet Take 1 tablet (20 mg total) by mouth daily. KEEP APPT 90 tablet 0  . meclizine (ANTIVERT) 12.5 MG tablet TAKE 1 TABLET (12.5 MG TOTAL) BY MOUTH 3 (THREE) TIMES DAILY. 30 tablet 0  . Multiple Vitamin (MULTI-VITAMIN DAILY PO) Take 1 tablet by mouth at bedtime.     . predniSONE (DELTASONE) 20 MG tablet Take 3 PO QAM x2days, 2 PO QAM x2days, 1 PO QAM x3days 18 tablet 0   No current facility-administered medications on file prior to visit.     ROS ROS otherwise unremarkable unless listed above.  Physical Examination: BP 133/74   Pulse 89   Temp 98.2 F (36.8 C) (Oral)   Resp 16    Ht 5' 2.5" (1.588 m)   Wt 138 lb 6.4 oz (62.8 kg)   SpO2 93%   BMI 24.91 kg/m  Ideal Body Weight: Weight in (lb) to have BMI = 25: 138.6  Physical Exam  Constitutional: She is oriented to person, place, and time. She appears well-developed and well-nourished. No distress.  HENT:  Head: Normocephalic and atraumatic.  Right Ear: External ear normal.  Left Ear: External ear normal.  Eyes: Conjunctivae and EOM are normal. Pupils are  equal, round, and reactive to light.  Cardiovascular: Normal rate and regular rhythm.  Exam reveals no gallop and no friction rub.   No murmur heard. Pulmonary/Chest: Effort normal. No accessory muscle usage. No apnea and no tachypnea. No respiratory distress. She has no decreased breath sounds. She has wheezes (expiratory wheezing.). She has no rhonchi.  Neurological: She is alert and oriented to person, place, and time.  Skin: She is not diaphoretic.  Psychiatric: She has a normal mood and affect. Her behavior is normal.     Assessment and Plan: Brianna [name] is a 83 y.o. female who is here today for follow up of cough and wheezing. This may be subtly improving, however wheezing still present.  I am requesting with referrals, a home health aide to come there, and assist in administering medications and inhaler at this time.  I would like her also to be assessed for O2 need.  This may require a visit with pulmonology.  The assistant can not relay if she has a pulmonologist.  I attempted to call daughter but noone answered.  Advised assistant to contact daughter to have her contact the office.  She stated that she will contact her employer to relay that message, as she does not have contact number of the daughter. Non-productive cough - Plan: Ambulatory referral to Home Health  COPD exacerbation [facility]) - Plan: Ambulatory referral to [place], PA-C [facility] 5/16/201811:40 AM

## 2016-12-12 DIAGNOSIS — F329 Major depressive disorder, single episode, unspecified: Secondary | ICD-10-CM | POA: Diagnosis not present

## 2016-12-12 DIAGNOSIS — I110 Hypertensive heart disease with heart failure: Secondary | ICD-10-CM | POA: Diagnosis not present

## 2016-12-12 DIAGNOSIS — J441 Chronic obstructive pulmonary disease with (acute) exacerbation: Secondary | ICD-10-CM | POA: Diagnosis not present

## 2016-12-12 DIAGNOSIS — I5032 Chronic diastolic (congestive) heart failure: Secondary | ICD-10-CM | POA: Diagnosis not present

## 2016-12-12 DIAGNOSIS — Z720 Tobacco use: Secondary | ICD-10-CM | POA: Diagnosis not present

## 2016-12-12 DIAGNOSIS — M810 Age-related osteoporosis without current pathological fracture: Secondary | ICD-10-CM | POA: Diagnosis not present

## 2016-12-15 DIAGNOSIS — J441 Chronic obstructive pulmonary disease with (acute) exacerbation: Secondary | ICD-10-CM | POA: Diagnosis not present

## 2016-12-15 DIAGNOSIS — I5032 Chronic diastolic (congestive) heart failure: Secondary | ICD-10-CM | POA: Diagnosis not present

## 2016-12-15 DIAGNOSIS — Z720 Tobacco use: Secondary | ICD-10-CM | POA: Diagnosis not present

## 2016-12-15 DIAGNOSIS — I110 Hypertensive heart disease with heart failure: Secondary | ICD-10-CM | POA: Diagnosis not present

## 2016-12-15 DIAGNOSIS — M810 Age-related osteoporosis without current pathological fracture: Secondary | ICD-10-CM | POA: Diagnosis not present

## 2016-12-15 DIAGNOSIS — F329 Major depressive disorder, single episode, unspecified: Secondary | ICD-10-CM | POA: Diagnosis not present

## 2016-12-20 ENCOUNTER — Emergency Department (HOSPITAL_COMMUNITY): Payer: Medicare Other

## 2016-12-20 ENCOUNTER — Encounter (HOSPITAL_COMMUNITY): Payer: Self-pay | Admitting: Emergency Medicine

## 2016-12-20 ENCOUNTER — Inpatient Hospital Stay (HOSPITAL_COMMUNITY)
Admission: EM | Admit: 2016-12-20 | Discharge: 2016-12-24 | DRG: 292 | Disposition: A | Discharge status: Home Health | Payer: Medicare Other | Attending: Family Medicine | Admitting: Family Medicine

## 2016-12-20 DIAGNOSIS — Z9010 Acquired absence of unspecified breast and nipple: Secondary | ICD-10-CM

## 2016-12-20 DIAGNOSIS — Z961 Presence of intraocular lens: Secondary | ICD-10-CM | POA: Diagnosis present

## 2016-12-20 DIAGNOSIS — J439 Emphysema, unspecified: Secondary | ICD-10-CM | POA: Diagnosis present

## 2016-12-20 DIAGNOSIS — I428 Other cardiomyopathies: Secondary | ICD-10-CM

## 2016-12-20 DIAGNOSIS — S0990XA Unspecified injury of head, initial encounter: Secondary | ICD-10-CM | POA: Diagnosis not present

## 2016-12-20 DIAGNOSIS — M810 Age-related osteoporosis without current pathological fracture: Secondary | ICD-10-CM | POA: Diagnosis present

## 2016-12-20 DIAGNOSIS — J8410 Pulmonary fibrosis, unspecified: Secondary | ICD-10-CM | POA: Diagnosis present

## 2016-12-20 DIAGNOSIS — F17210 Nicotine dependence, cigarettes, uncomplicated: Secondary | ICD-10-CM | POA: Diagnosis present

## 2016-12-20 DIAGNOSIS — I4892 Unspecified atrial flutter: Secondary | ICD-10-CM | POA: Diagnosis present

## 2016-12-20 DIAGNOSIS — I2510 Atherosclerotic heart disease of native coronary artery without angina pectoris: Secondary | ICD-10-CM | POA: Diagnosis present

## 2016-12-20 DIAGNOSIS — I447 Left bundle-branch block, unspecified: Secondary | ICD-10-CM | POA: Diagnosis present

## 2016-12-20 DIAGNOSIS — Z9841 Cataract extraction status, right eye: Secondary | ICD-10-CM | POA: Diagnosis not present

## 2016-12-20 DIAGNOSIS — Z7982 Long term (current) use of aspirin: Secondary | ICD-10-CM

## 2016-12-20 DIAGNOSIS — I739 Peripheral vascular disease, unspecified: Secondary | ICD-10-CM | POA: Diagnosis present

## 2016-12-20 DIAGNOSIS — I480 Paroxysmal atrial fibrillation: Secondary | ICD-10-CM | POA: Diagnosis present

## 2016-12-20 DIAGNOSIS — I4891 Unspecified atrial fibrillation: Secondary | ICD-10-CM | POA: Diagnosis not present

## 2016-12-20 DIAGNOSIS — Z79899 Other long term (current) drug therapy: Secondary | ICD-10-CM

## 2016-12-20 DIAGNOSIS — E876 Hypokalemia: Secondary | ICD-10-CM | POA: Diagnosis present

## 2016-12-20 DIAGNOSIS — T451X5A Adverse effect of antineoplastic and immunosuppressive drugs, initial encounter: Secondary | ICD-10-CM | POA: Diagnosis present

## 2016-12-20 DIAGNOSIS — Z9842 Cataract extraction status, left eye: Secondary | ICD-10-CM

## 2016-12-20 DIAGNOSIS — R4020 Unspecified coma: Secondary | ICD-10-CM | POA: Diagnosis not present

## 2016-12-20 DIAGNOSIS — I5043 Acute on chronic combined systolic (congestive) and diastolic (congestive) heart failure: Secondary | ICD-10-CM | POA: Diagnosis present

## 2016-12-20 DIAGNOSIS — R42 Dizziness and giddiness: Secondary | ICD-10-CM | POA: Diagnosis present

## 2016-12-20 DIAGNOSIS — E785 Hyperlipidemia, unspecified: Secondary | ICD-10-CM | POA: Diagnosis present

## 2016-12-20 DIAGNOSIS — I110 Hypertensive heart disease with heart failure: Principal | ICD-10-CM | POA: Diagnosis present

## 2016-12-20 DIAGNOSIS — J449 Chronic obstructive pulmonary disease, unspecified: Secondary | ICD-10-CM | POA: Diagnosis present

## 2016-12-20 DIAGNOSIS — R0902 Hypoxemia: Secondary | ICD-10-CM | POA: Diagnosis not present

## 2016-12-20 DIAGNOSIS — F329 Major depressive disorder, single episode, unspecified: Secondary | ICD-10-CM | POA: Diagnosis present

## 2016-12-20 DIAGNOSIS — E7800 Pure hypercholesterolemia, unspecified: Secondary | ICD-10-CM | POA: Diagnosis present

## 2016-12-20 DIAGNOSIS — Z8249 Family history of ischemic heart disease and other diseases of the circulatory system: Secondary | ICD-10-CM

## 2016-12-20 DIAGNOSIS — Z9012 Acquired absence of left breast and nipple: Secondary | ICD-10-CM

## 2016-12-20 DIAGNOSIS — Y92009 Unspecified place in unspecified non-institutional (private) residence as the place of occurrence of the external cause: Secondary | ICD-10-CM

## 2016-12-20 DIAGNOSIS — W19XXXA Unspecified fall, initial encounter: Secondary | ICD-10-CM

## 2016-12-20 DIAGNOSIS — J441 Chronic obstructive pulmonary disease with (acute) exacerbation: Secondary | ICD-10-CM | POA: Diagnosis not present

## 2016-12-20 DIAGNOSIS — I5023 Acute on chronic systolic (congestive) heart failure: Secondary | ICD-10-CM | POA: Diagnosis not present

## 2016-12-20 DIAGNOSIS — Z66 Do not resuscitate: Secondary | ICD-10-CM | POA: Diagnosis present

## 2016-12-20 DIAGNOSIS — Z888 Allergy status to other drugs, medicaments and biological substances status: Secondary | ICD-10-CM

## 2016-12-20 DIAGNOSIS — Z853 Personal history of malignant neoplasm of breast: Secondary | ICD-10-CM

## 2016-12-20 DIAGNOSIS — Z7951 Long term (current) use of inhaled steroids: Secondary | ICD-10-CM

## 2016-12-20 DIAGNOSIS — R404 Transient alteration of awareness: Secondary | ICD-10-CM | POA: Diagnosis not present

## 2016-12-20 DIAGNOSIS — I10 Essential (primary) hypertension: Secondary | ICD-10-CM | POA: Diagnosis not present

## 2016-12-20 DIAGNOSIS — Z95810 Presence of automatic (implantable) cardiac defibrillator: Secondary | ICD-10-CM | POA: Diagnosis not present

## 2016-12-20 DIAGNOSIS — I429 Cardiomyopathy, unspecified: Secondary | ICD-10-CM | POA: Diagnosis present

## 2016-12-20 DIAGNOSIS — I5032 Chronic diastolic (congestive) heart failure: Secondary | ICD-10-CM

## 2016-12-20 DIAGNOSIS — R0602 Shortness of breath: Secondary | ICD-10-CM | POA: Diagnosis not present

## 2016-12-20 DIAGNOSIS — W1830XA Fall on same level, unspecified, initial encounter: Secondary | ICD-10-CM | POA: Diagnosis present

## 2016-12-20 HISTORY — DX: Anxiety disorder, unspecified: F41.9

## 2016-12-20 HISTORY — DX: Pneumonia, unspecified organism: J18.9

## 2016-12-20 HISTORY — DX: Presence of automatic (implantable) cardiac defibrillator: Z95.810

## 2016-12-20 HISTORY — DX: Repeated falls: R29.6

## 2016-12-20 HISTORY — DX: Malignant neoplasm of unspecified site of left female breast: C50.912

## 2016-12-20 HISTORY — DX: Cardiac murmur, unspecified: R01.1

## 2016-12-20 HISTORY — DX: Unspecified osteoarthritis, unspecified site: M19.90

## 2016-12-20 LAB — CBC WITH DIFFERENTIAL/PLATELET
BASOS PCT: 0 %
Basophils Absolute: 0 10*3/uL (ref 0.0–0.1)
EOS ABS: 0.1 10*3/uL (ref 0.0–0.7)
EOS PCT: 1 %
HCT: 39.7 % (ref 36.0–46.0)
HEMOGLOBIN: 13.3 g/dL (ref 12.0–15.0)
Lymphocytes Relative: 16 %
Lymphs Abs: 1.5 10*3/uL (ref 0.7–4.0)
MCH: 30.7 pg (ref 26.0–34.0)
MCHC: 33.5 g/dL (ref 30.0–36.0)
MCV: 91.7 fL (ref 78.0–100.0)
MONOS PCT: 6 %
Monocytes Absolute: 0.6 10*3/uL (ref 0.1–1.0)
NEUTROS PCT: 77 %
Neutro Abs: 7.5 10*3/uL (ref 1.7–7.7)
PLATELETS: 139 10*3/uL — AB (ref 150–400)
RBC: 4.33 MIL/uL (ref 3.87–5.11)
RDW: 14.1 % (ref 11.5–15.5)
WBC: 9.8 10*3/uL (ref 4.0–10.5)

## 2016-12-20 LAB — COMPREHENSIVE METABOLIC PANEL
ALK PHOS: 66 U/L (ref 38–126)
ALT: 16 U/L (ref 14–54)
AST: 29 U/L (ref 15–41)
Albumin: 3.5 g/dL (ref 3.5–5.0)
Anion gap: 10 (ref 5–15)
BUN: 19 mg/dL (ref 6–20)
CALCIUM: 8.8 mg/dL — AB (ref 8.9–10.3)
CO2: 24 mmol/L (ref 22–32)
CREATININE: 0.88 mg/dL (ref 0.44–1.00)
Chloride: 102 mmol/L (ref 101–111)
GFR, EST NON AFRICAN AMERICAN: 59 mL/min — AB (ref >60)
Glucose, Bld: 114 mg/dL — ABNORMAL HIGH (ref 65–99)
Potassium: 3.3 mmol/L — ABNORMAL LOW (ref 3.5–5.1)
Sodium: 136 mmol/L (ref 135–145)
Total Bilirubin: 1.2 mg/dL (ref 0.3–1.2)
Total Protein: 7.2 g/dL (ref 6.5–8.1)

## 2016-12-20 LAB — TROPONIN I: Troponin I: <0.03 ng/mL (ref <0.03)

## 2016-12-20 LAB — CK: CK TOTAL: 60 U/L (ref 38–234)

## 2016-12-20 LAB — BRAIN NATRIURETIC PEPTIDE: B Natriuretic Peptide: 833.8 pg/mL — ABNORMAL HIGH (ref 0.0–100.0)

## 2016-12-20 MED ORDER — ASPIRIN EC 81 MG PO TBEC
81.0000 mg | DELAYED_RELEASE_TABLET | Freq: Every day | ORAL | Status: DC
  Administered 2016-12-21 – 2016-12-24 (×4): 81 mg via ORAL
  Filled 2016-12-20 (×4): qty 1

## 2016-12-20 MED ORDER — ALBUTEROL SULFATE (2.5 MG/3ML) 0.083% IN NEBU
2.5000 mg | INHALATION_SOLUTION | RESPIRATORY_TRACT | Status: DC | PRN
  Administered 2016-12-21: 2.5 mg via RESPIRATORY_TRACT
  Filled 2016-12-20: qty 3

## 2016-12-20 MED ORDER — MECLIZINE HCL 25 MG PO TABS
25.0000 mg | ORAL_TABLET | Freq: Once | ORAL | Status: AC
  Administered 2016-12-20: 25 mg via ORAL
  Filled 2016-12-20: qty 1

## 2016-12-20 MED ORDER — MOMETASONE FURO-FORMOTEROL FUM 100-5 MCG/ACT IN AERO
2.0000 | INHALATION_SPRAY | Freq: Two times a day (BID) | RESPIRATORY_TRACT | Status: DC
  Administered 2016-12-21 – 2016-12-24 (×6): 2 via RESPIRATORY_TRACT
  Filled 2016-12-20: qty 8.8

## 2016-12-20 MED ORDER — ACETAMINOPHEN 650 MG RE SUPP: 650.0000 mg | Freq: Four times a day (QID) | RECTAL | Status: DC | PRN

## 2016-12-20 MED ORDER — ONDANSETRON HCL 4 MG PO TABS: 4.0000 mg | ORAL_TABLET | Freq: Four times a day (QID) | ORAL | Status: DC | PRN

## 2016-12-20 MED ORDER — ONDANSETRON HCL 4 MG/2ML IJ SOLN: 4.0000 mg | Freq: Four times a day (QID) | INTRAMUSCULAR | Status: DC | PRN

## 2016-12-20 MED ORDER — ACETAMINOPHEN 325 MG PO TABS
650.0000 mg | ORAL_TABLET | Freq: Four times a day (QID) | ORAL | Status: DC | PRN
  Administered 2016-12-20 – 2016-12-23 (×3): 650 mg via ORAL
  Filled 2016-12-20 (×3): qty 2

## 2016-12-20 MED ORDER — POTASSIUM CHLORIDE CRYS ER 20 MEQ PO TBCR
40.0000 meq | EXTENDED_RELEASE_TABLET | Freq: Two times a day (BID) | ORAL | Status: AC
  Administered 2016-12-20 – 2016-12-21 (×2): 40 meq via ORAL
  Filled 2016-12-20 (×2): qty 2

## 2016-12-20 MED ORDER — IOPAMIDOL (ISOVUE-370) INJECTION 76%
INTRAVENOUS | Status: AC
  Administered 2016-12-20: 80 mL
  Filled 2016-12-20: qty 100

## 2016-12-20 MED ORDER — TIOTROPIUM BROMIDE MONOHYDRATE 18 MCG IN CAPS
18.0000 ug | ORAL_CAPSULE | Freq: Every day | RESPIRATORY_TRACT | Status: DC
  Administered 2016-12-21 – 2016-12-24 (×4): 18 ug via RESPIRATORY_TRACT
  Filled 2016-12-20: qty 5

## 2016-12-20 MED ORDER — BENZONATATE 100 MG PO CAPS: 100.0000 mg | ORAL_CAPSULE | Freq: Three times a day (TID) | ORAL | Status: DC | PRN

## 2016-12-20 MED ORDER — ENOXAPARIN SODIUM 40 MG/0.4ML ~~LOC~~ SOLN
40.0000 mg | SUBCUTANEOUS | Status: DC
  Administered 2016-12-20 – 2016-12-23 (×4): 40 mg via SUBCUTANEOUS
  Filled 2016-12-20 (×4): qty 0.4

## 2016-12-20 MED ORDER — CARVEDILOL 3.125 MG PO TABS
3.1250 mg | ORAL_TABLET | Freq: Two times a day (BID) | ORAL | Status: DC
  Filled 2016-12-20: qty 1

## 2016-12-20 MED ORDER — FLUOXETINE HCL 10 MG PO CAPS
10.0000 mg | ORAL_CAPSULE | Freq: Every day | ORAL | Status: DC
  Administered 2016-12-20 – 2016-12-24 (×5): 10 mg via ORAL
  Filled 2016-12-20 (×5): qty 1

## 2016-12-20 MED ORDER — SODIUM CHLORIDE 0.9% FLUSH
3.0000 mL | Freq: Two times a day (BID) | INTRAVENOUS | Status: DC
  Administered 2016-12-20 – 2016-12-24 (×8): 3 mL via INTRAVENOUS

## 2016-12-20 MED ORDER — ALBUTEROL SULFATE (2.5 MG/3ML) 0.083% IN NEBU
5.0000 mg | INHALATION_SOLUTION | Freq: Once | RESPIRATORY_TRACT | Status: AC
  Administered 2016-12-20: 5 mg via RESPIRATORY_TRACT
  Filled 2016-12-20: qty 6

## 2016-12-20 MED ORDER — DOCUSATE SODIUM 100 MG PO CAPS
100.0000 mg | ORAL_CAPSULE | Freq: Two times a day (BID) | ORAL | Status: DC
  Administered 2016-12-20 – 2016-12-24 (×6): 100 mg via ORAL
  Filled 2016-12-20 (×8): qty 1

## 2016-12-20 NOTE — ED Notes (Signed)
ED Provider at bedside. 

## 2016-12-20 NOTE — ED Notes (Signed)
Admitting at bedside 

## 2016-12-20 NOTE — ED Notes (Signed)
medtronic pacemaker/ICD interrogated

## 2016-12-20 NOTE — ED Triage Notes (Addendum)
Pt arrives via [facility], ems reports pts home health aid came to find pt lying on the floor this am. Pt states she lost her balance while trying to walk with her walker and carry her drink. Pt does endorse some dizziness that began after the fall, no blood thinners, states she did hit her head, c/o right elbow and head pain. Pt a/ox4. 10 albuterol, 0.5 mg atrovent, 125mg  solumedrol given pta due to wheezing and coughing.

## 2016-12-20 NOTE — ED Notes (Addendum)
Pt ambulated with pulse ox by this RN and Shanon Brow, EDT. pts O2 dropped into low 80's during ambulation. Pt appeared sob and reported more sob than she normally experiences with ambulation. O2 sat back up to 94% after patient returned to stretcher.

## 2016-12-20 NOTE — ED Notes (Signed)
Brianna [name]; [phone] ; have contacted pt son Brianna [name]. If you need anything please call her.

## 2016-12-20 NOTE — H&P (Signed)
[name] Admission History and Physical Service Pager: [phone]  Patient name: Brianna [name] Medical record number: [id number] Date of birth: 1930-02-29 Age: 83 y.o. Gender: female  Primary Care Provider: Donnamae Jude, MD Consultants: None  Code Status: DNR   Chief Complaint: hypoxia, dizziness   Assessment and Plan:  Brianna [name] is a 83 y.o. female presenting with dizziness and mechanical fall.  PMH is significant for HLD, HTN, HFrEF, h/o breast ca, h/o CAD with pacemaker, depression, COPD, insomnia and allergies.     Mechanical fall 2/2 dizziness (vertigo): second fall this year. Turned around in kitchen and fell. No prodromal symptoms. Patient notes she hit her head but did not lose consciousness and did not have any preceding symptoms. Not sure if she had LOC. She was not able to get up. Fall was not witnessed as she lives alone.  With no suspicion that this a syncopal episode.   She has been evaluated in the past for falls and had a recent admission on 12/2015 for similar.   CT head negative for acute intracranial abnormalities.  Evidence of chronic small vessel disease but no definite progression since 2017.  On arrival, patient afebrile with no white count.  Labs notable for low platelets of 139 (previously 274-300) and hypokalemia to 3.3.  Initial troponin negative.  EKG with left bundle branch block but per chart review of EKG from  2017 this is not new.   In the ED she was given Meclizine with some improvement in her symptoms.   -Admit to [facility]PTS, attending Dr. Mingo [name]  -Monitor on tele  -Will trend trops -AM EKG  -AM BMET and CBC  -Orthostatic vitals. She reports postural dizziness.  -Echo -PT/OT recs  -Fall precautions  -vitals per unit routine    DOE likely 2/2 underlying COPD and not on home O2.  Patient with desats to 80's in the ED but noted only with ambulation and does not require O2 at rest. She is not on O2 at home.  Reports she has  noticed dyspnea on exertion when walking to the mailbox. Denies chest pain, palpitations and leg swelling. Denies orthopnea and PND.  Of note, patient recently seen at [facility] on 5/15  for follow up for COPD exacerbation with wheezing and cough.  She was given Doxycycline, Advair and Albuterol at that time.  Notes she has since then completed her course of antibiotics.  On arrival VSS and patient with no white count or fever. Cough improved and dry. No hemoptysis. CXR with stable type changes on the right, no edema or consolidation. Bilateral carotid artery calcification noted. It is rotated film with poor respiratory effort.  CT Angio with chronic fibrotic changes within lungs bilaterally R>L. No evidence of PE. Do not suspect this is due to CHF exacerbation as she has no signs of fluid overload on exam except bilateral crackles which seems chronic per cardiology note. She has no JVD or HJR. CXR not suspicious for infectious etiology and patient is afebrile with no white count. Must also rule out ACS as possible cause of her shortness of breath.  Will treat as COPD for now in setting of possible new O2 requirement with ambulation. O2 requirement with ambulation could be just a new baseline after recent exacerbation vs. new exacerbation.  -Monitor respiratory status  -Dulera 2 puffs BID -Albuterol nebulizer Q4 PRN  -Hold off steroid and antibiotics  -Add Spiriva   Hypokalemia.  K+ on admission 3.3.   -Replete  with Kdur 40 mEq x2  -Daily BMET   H/o CAD with Pacemaker.   At home on daily ASA and Coreg.  Patient currently denies chest pain and palpitations.  Pacemaker was interrogated and found to have SVT the night preceding the fall however this is likely unrelated to her dizziness and fall.   -Continue ASA 81 mg and Coreg -May consider changing coreg to more cardioselective BB -Trend troponins  -AM EKG   HLD .  Stable.  Lipid panel from 2016 with LDL 77, HDL 81, VLDL 12 and total cholesterol 170.   Not  on a statin medication due to intolerance.   On chart noted to be allergic.   HTN. Normotensive on admission with BP 121/68.  At home on Coreg and Enalapril.  -Will continue Coreg -Hold Enalapril for now as she is normotensive.  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  NICM secondary to Brianna [name] chemo. She is followed by Dr. Sallyanne [name]. Last echo 12/2015 with EF 40-45%.  She is at home on Lasix 20 mg daily.  She does not have any signs of volume overload on exam to suggest CHF exacerbation at this time however is exhibiting dyspnea on exertion.  -Will hold home Lasix for now  -BNP  -Echo as above  -I's and O's -daily weights -Monitor fluid status  Depression.  Stable.  At home on Prozac 10 mg daily.  -Continue home med  FEN/GI: Heart diet, SLIV   Prophylaxis: Lovenox  Disposition: Admit to telemetry, attending Dr. Mingo [name]   History of Present Illness:  Brianna [name] is a 83 y.o. female presenting with dizziness resulting in a mechanical fall.  She is present with daughter and caretaker in ED.   Lives alone at home.  Has a daily caretaker.  Was filling cup of water to take her pills this morning when she dropped the cup.  As she reached down to pick it up she felt very dizzy and fell. She notes she hit her head but did not lose consciousness.  She has had dizziness in the past as well for the past 3 years and takes Meclizine for this.  She did not have any preceding symptoms with the dizziness.  No palpitations or sensations of heart racing.  Denies fevers, nausea, vomiting.  Has been tolerating po well.  No changes in urination or bowel patterns.  Has had a bad congestion and dry cough x past 2 weeks but is now clearing up since this weekend.  Nonproductive.   Has not taken her medications today or last night either.    Review Of Systems: Per HPI with the following additions:   Review of Systems  Constitutional: Negative for chills, diaphoresis and fever.  HENT: Positive for  congestion. Negative for sore throat.   Respiratory: Positive for cough and shortness of breath. Negative for sputum production.   Cardiovascular: Negative for chest pain, orthopnea and leg swelling.  Gastrointestinal: Negative for abdominal pain, nausea and vomiting.  Genitourinary: Negative for dysuria, frequency and urgency.  Musculoskeletal: Positive for falls.  Neurological: Positive for dizziness. Negative for focal weakness, seizures, loss of consciousness, weakness and headaches.   Patient Active Problem List   Diagnosis Date Noted  . Hypoxia 12/20/2016  . Fracture of right humerus 12/29/2015  . Syncope 12/28/2015  . Acute blood loss anemia 06/17/2014  . COPD (chronic obstructive pulmonary disease) ([place]) 06/10/2014  . Asthma, chronic 05/19/2014  . Constipation 05/19/2014  . Major depression, chronic 05/19/2014  .  Protein-calorie malnutrition, severe ([place]) 05/12/2014  . Hip fracture, intertrochanteric ([place]) 05/10/2014  . Nonischemic cardiomyopathy ([place]) 03/24/2014  . Dyspnea 08/03/2013  . Automatic implantable cardioverter-defibrillator in situ 04/16/2013  . CYSTOCELE WITHOUT MENTION UTERINE PROLAPSE LAT 10/12/2008  . BREAST CANCER, HX OF 11/05/2007  . INSOMNIA-SLEEP DISORDER-UNSPEC 07/10/2007  . HYPERCHOLESTEROLEMIA 09/20/2006  . HYPERTENSION, BENIGN SYSTEMIC 09/20/2006  . VENTRICULAR TACHYCARDIA 09/20/2006  . Chronic diastolic heart failure ([place]) 09/20/2006  . PSORIASIS 09/20/2006  . Osteoporosis 09/20/2006   Past Medical History: Past Medical History:  Diagnosis Date  . Automatic implantable cardiac defibrillator in situ   . Cancer ([place])    breast  . Depression   . Emphysema of lung ([place])   . Hyperlipidemia   . Hypertension   . Nonischemic cardiomyopathy ([place])   . Osteopenia    Past Surgical History: Past Surgical History:  Procedure Laterality Date  . APPENDECTOMY  1943  . BREAST SURGERY     mastectomy  . CARDIAC CATHETERIZATION  12/18/2002   Normal  coronary arteries and normal LV function  . CARDIAC DEFIBRILLATOR PLACEMENT  10/13/2004   Medtronic Maximo VR, model [id number], serial [id number]  . FEMUR IM NAIL Right 05/10/2014   Procedure: INTRAMEDULLARY (IM) NAIL FEMORAL;  Surgeon: Marianna Payment, MD;  Location: [facility];  Service: Orthopedics;  Laterality: Right;  . PACEMAKER INSERTION  06/17/2010   Medtronic [place] VR, model #D334VRG, serial #[id number]  . TONSILLECTOMY  1940   Social History: Social History  Substance Use Topics  . Smoking status: Light Tobacco Smoker    Types: Cigarettes  . Smokeless tobacco: Never Used     Comment: Pt reports smoking about 1-2 times a week when stressed.   . Alcohol use 0.6 oz/week    1 Glasses of wine per week   Additional social history: Lives home alone.  Smoker <5 cigarettes/day  X past 20-25 years.  She reports drinking one glass of wine a night.  No drug use.   Please also refer to relevant sections of EMR.  Family History: Family History  Problem Relation Age of Onset  . Heart disease Mother        Pacemaker  . Heart disease Father        No details.    . Hyperlipidemia Sister   . Heart disease Brother        "Heart Attacks"  Died age 41  . Cancer Daughter        Oral cancer and "knot" on right cheek that was cancer   Allergies and Medications: Allergies  Allergen Reactions  . Atorvastatin     Fatigue   . Claritin [Loratadine] Other (See Comments)    Fatigue   . Crestor [Rosuvastatin] Other (See Comments)    Myalgia   No current facility-administered medications on file prior to encounter.    Current Outpatient Prescriptions on File Prior to Encounter  Medication Sig Dispense Refill  . ADVAIR DISKUS 100-50 MCG/DOSE AEPB INHALE 1 PUFF INTO THE LUNGS 2 (TWO) TIMES DAILY. 60 each 1  . albuterol (PROAIR HFA) 108 (90 Base) MCG/ACT inhaler Inhale 2 puffs into the lungs every 6 (six) hours as needed for wheezing or shortness of breath. 8 g 2  . aspirin EC 81 MG tablet  Take 81 mg by mouth daily.    . benzonatate (TESSALON) 100 MG capsule Take 1-2 capsules (100-200 mg total) by mouth 3 (three) times daily as needed for cough. 40 capsule 0  . carvedilol (COREG) 3.125 MG tablet TAKE  1 TABLET (3.125 MG TOTAL) BY MOUTH 2 (TWO) TIMES DAILY WITH A MEAL. 60 tablet 10  . cetirizine (ZYRTEC) 5 MG tablet Take 1 tablet (5 mg total) by mouth daily. (Patient taking differently: Take 5 mg by mouth daily as needed (for seasonal allergies). ) 30 tablet 11  . enalapril (VASOTEC) 2.5 MG tablet TAKE 2 TABLETS BY MOUTH DAILY 180 tablet 2  . feeding supplement, ENSURE COMPLETE, (ENSURE COMPLETE) LIQD Take 237 mLs by mouth 3 (three) times daily between meals. (Patient taking differently: Take 237 mLs by mouth daily as needed. IF NO MEAL IS CONSUMED) 6 Bottle 1  . FLUoxetine (PROZAC) 10 MG capsule TAKE 1 CAPSULE (10 MG TOTAL) BY MOUTH DAILY. 30 capsule 2  . furosemide (LASIX) 20 MG tablet Take 1 tablet (20 mg total) by mouth daily. KEEP APPT (Patient taking differently: Take 20 mg by mouth daily. ) 90 tablet 0  . meclizine (ANTIVERT) 12.5 MG tablet TAKE 1 TABLET (12.5 MG TOTAL) BY MOUTH 3 (THREE) TIMES DAILY. (Patient not taking: Reported on 12/20/2016) 30 tablet 0  . predniSONE (DELTASONE) 20 MG tablet Take 3 PO QAM x2days, 2 PO QAM x2days, 1 PO QAM x3days (Patient not taking: Reported on 12/20/2016) 18 tablet 0   Objective: BP 120/67 (BP Location: Left Arm)   Pulse 74   Temp 97.9 F (36.6 C)   Resp 20   Wt 134 lb 4.2 oz (60.9 kg)   SpO2 95%   BMI 24.17 kg/m  Exam: General: pleasant 83 yo F, sitting in bed, NAD Eyes: EOMI, PERRL ENTM: MMM, o/p clear  Neck: supple Cardiovascular: RRR no MRG, pacemaker in place, 2+ pulses, no edema, no JVD or HJR Respiratory: bibasilar crackles appreciated, no increased work of breathing on RA  Gastrointestinal: soft, NTND, +bs  MSK: no edema or tenderness noted  Derm: warm, dry Neuro: AOx4, no focal deficits, 5/5 strength bilaterally in upper  and lower extremities, sensation intact  Psych: normal affect   Labs and Imaging: CBC BMET   Recent Labs Lab 12/20/16 1131  WBC 9.8  HGB 13.3  HCT 39.7  PLT 139*    Recent Labs Lab 12/20/16 1131  NA 136  K 3.3*  CL 102  CO2 24  BUN 19  CREATININE 0.88  GLUCOSE 114*  CALCIUM 8.8*     Dg Chest 2 View  Result Date: 12/20/2016 CLINICAL DATA:  Pain following fall.  Shortness of breath EXAM: CHEST  2 VIEW COMPARISON:  Dec 01, 2016 and December 28, 2015 FINDINGS: There are stable areas of fibrosis in the right upper and right lower lobe regions. There is no appreciable edema or consolidation. Heart is mildly enlarged with pulmonary vascular within normal limits. Pacemaker leads are attached to the right ventricle. There is aortic atherosclerosis. No adenopathy. Bones are diffusely osteoporotic. There is evidence of old trauma in the proximal right humerus. There is calcification in the left and right carotid artery lesions. IMPRESSION: Stable fibrotic type change on the right. No edema or consolidation. Stable cardiac prominence. No change in pacemaker lead placement. There is aortic atherosclerosis. Bones osteoporotic. Bilateral carotid artery calcification noted. Electronically Signed   By: Lowella Grip III M.D.   On: 12/20/2016 12:06   Ct Head Wo Contrast  Result Date: 12/20/2016 CLINICAL DATA:  83 year old female with fall, dizziness, shortness of breath. EXAM: CT HEAD WITHOUT CONTRAST TECHNIQUE: Contiguous axial images were obtained from the base of the skull through the vertex without intravenous contrast. COMPARISON:  Head and  cervical spine CT 12/28/2015 and earlier. FINDINGS: Brain: No midline shift, ventriculomegaly, mass effect, evidence of mass lesion, intracranial hemorrhage or evidence of cortically based acute infarction. Patchy hypodensity in the cerebral white matter and deep gray matter nuclei has not significantly changed since 2017. Vascular: Calcified atherosclerosis at  the skull base. Skull: No acute osseous abnormality identified. Sinuses/Orbits: Chronic bubbly opacity in the right sphenoid sinus and mild inferior mastoid fluid has not significantly changed. Other visualized paranasal sinuses are stable and well pneumatized. Bilateral tympanic cavities remain clear. Other: Stable orbit and scalp soft tissues. IMPRESSION: 1. No acute intracranial abnormality. No acute traumatic injury identified. 2. Evidence of chronic small vessel disease. No definite progression since 2017. Electronically Signed   By: Genevie Ann M.D.   On: 12/20/2016 12:49   Ct Angio Chest Pe W/cm &/or Wo Cm  Result Date: 12/20/2016 CLINICAL DATA:  Found unresponsive EXAM: CT ANGIOGRAPHY CHEST WITH CONTRAST TECHNIQUE: Multidetector CT imaging of the chest was performed using the standard protocol during bolus administration of intravenous contrast. Multiplanar CT image reconstructions and MIPs were obtained to evaluate the vascular anatomy. CONTRAST:  80 mL Isovue 370. COMPARISON:  Plain film from earlier in the same day. FINDINGS: Cardiovascular: Thoracic aorta demonstrates atherosclerotic calcifications. Incomplete opacification is noted. The pulmonary artery demonstrates a normal branching pattern without evidence of filling defect to suggest pulmonary embolism. Coronary calcifications are seen. Mild cardiac enlargement is noted. A defibrillator is seen and stable. Mediastinum/Nodes: The thoracic inlet is within normal limits. No definitive hilar or mediastinal adenopathy is seen. Lungs/Pleura: The lungs are well aerated bilaterally. Chronic appearing fibrotic changes are noted throughout the right lung similar to that seen on prior plain film examinations. No focal confluent infiltrate is seen. No parenchymal nodules are noted. Upper Abdomen: Within normal limits. Musculoskeletal: Scoliosis concave to the left is noted in the thoracic spine. No acute compression deformity is noted. Stable mid thoracic  compression deformity is seen. Review of the MIP images confirms the above findings. IMPRESSION: Chronic fibrotic changes within the lungs bilaterally right greater than left. No evidence of pulmonary emboli. Chronic bony changes. Electronically Signed   By: Inez Catalina M.D.   On: 12/20/2016 14:54    Lovenia Kim, MD 12/20/2016, 8:22 PM PGY-1, [facility] pager: [phone], text pages welcome  I have seen and evaluated the patient with Dr. Reesa Chew. I am in agreement with the note above in its revised form. My additions are in red.  Wendee Beavers, MD, PGY-2 12/20/2016 9:14 PM

## 2016-12-20 NOTE — ED Notes (Signed)
Patient transported to CT 

## 2016-12-20 NOTE — ED Notes (Signed)
Patient transported to X-ray 

## 2016-12-20 NOTE — ED Notes (Signed)
Report attempted 

## 2016-12-20 NOTE — ED Provider Notes (Signed)
[facility] DEPT Provider Note   CSN: [id number] Arrival date & time: 12/20/16  1027     History   Chief Complaint Chief Complaint  Patient presents with  . Fall  . Shortness of Breath    HPI Brianna [name] is a 83 y.o. female.  HPI  83 year old female presents after a fall this morning. She states she was walking with her walker to go Arnette Norris a glass of water at around 8 AM when she all of a sudden fell. She does not think she tripped or slipped. She thinks her legs were weak. She has been having dizziness since the fall. She thinks she hit the back of her head but did not lose consciousness. She did not feel any dizziness preceding this. The dizziness is described as a lightheadedness but feels like she is off balance. She does not feel like she's about to pass out. No vomiting or diarrhea. No focal weakness or numbness. She was not able to get up off the floor by herself. Her aide called 911 and when they got her up she was feeling significantly more dizzy and so she came to the hospital. Still mildly dizzy currently at rest. She uses the walker due to a prior broken hip.  EMS noted her to be short of breath and gave her albuterol treatment and Solu-Medrol. She states that she has chronic shortness of breath for several months but was not told why. However she is also been diagnosed with emphysema according to the chart and is on COPD medicine. Somewhat better after albuterol. However the short of breath is not worse today or has not been worse recently.  Past Medical History:  Diagnosis Date  . Automatic implantable cardiac defibrillator in situ   . Cancer ([place])    breast  . Depression   . Emphysema of lung ([place])   . Hyperlipidemia   . Hypertension   . Nonischemic cardiomyopathy ([place])   . Osteopenia     Patient Active Problem List   Diagnosis Date Noted  . Fracture of right humerus 12/29/2015  . Syncope 12/28/2015  . Acute blood loss anemia 06/17/2014  . COPD  (chronic obstructive pulmonary disease) ([place]) 06/10/2014  . Asthma, chronic 05/19/2014  . Constipation 05/19/2014  . Major depression, chronic 05/19/2014  . Protein-calorie malnutrition, severe ([place]) 05/12/2014  . Hip fracture, intertrochanteric ([place]) 05/10/2014  . Nonischemic cardiomyopathy ([place]) 03/24/2014  . Dyspnea 08/03/2013  . Automatic implantable cardioverter-defibrillator in situ 04/16/2013  . CYSTOCELE WITHOUT MENTION UTERINE PROLAPSE LAT 10/12/2008  . BREAST CANCER, HX OF 11/05/2007  . INSOMNIA-SLEEP DISORDER-UNSPEC 07/10/2007  . HYPERCHOLESTEROLEMIA 09/20/2006  . HYPERTENSION, BENIGN SYSTEMIC 09/20/2006  . VENTRICULAR TACHYCARDIA 09/20/2006  . Chronic diastolic heart failure ([place]) 09/20/2006  . PSORIASIS 09/20/2006  . Osteoporosis 09/20/2006    Past Surgical History:  Procedure Laterality Date  . APPENDECTOMY  1943  . BREAST SURGERY     mastectomy  . CARDIAC CATHETERIZATION  12/18/2002   Normal coronary arteries and normal LV function  . CARDIAC DEFIBRILLATOR PLACEMENT  10/13/2004   Medtronic Maximo VR, model [id number], serial [id number]  . FEMUR IM NAIL Right 05/10/2014   Procedure: INTRAMEDULLARY (IM) NAIL FEMORAL;  Surgeon: Marianna Payment, MD;  Location: [facility];  Service: Orthopedics;  Laterality: Right;  . PACEMAKER INSERTION  06/17/2010   Medtronic Protecta VR, model #[id number], serial [id number]  . TONSILLECTOMY  1940    OB History    No data available       Home Medications  Prior to Admission medications   Medication Sig Start Date End Date Taking? Authorizing Provider  ADVAIR DISKUS 100-50 MCG/DOSE AEPB INHALE 1 PUFF INTO THE LUNGS 2 (TWO) TIMES DAILY. 10/08/16  Yes Donnamae Jude, MD  albuterol (PROAIR HFA) 108 (90 Base) MCG/ACT inhaler Inhale 2 puffs into the lungs every 6 (six) hours as needed for wheezing or shortness of breath. 12/01/16  Yes Ivar Drape D, PA  aspirin EC 81 MG tablet Take 81 mg by mouth daily.   Yes [provider]  benzonatate (TESSALON) 100 MG capsule Take 1-2 capsules (100-200 mg total) by mouth 3 (three) times daily as needed for cough. 12/01/16   Ivar Drape D, PA  Calcium Carbonate-Vitamin D (CALTRATE 600+D) 600-400 MG-UNIT per tablet Take 1 tablet by mouth daily.     [provider]  carvedilol (COREG) 3.125 MG tablet TAKE 1 TABLET (3.125 MG TOTAL) BY MOUTH 2 (TWO) TIMES DAILY WITH A MEAL. 07/03/16   Lorretta Harp, MD  cetirizine (ZYRTEC) 5 MG tablet Take 1 tablet (5 mg total) by mouth daily. 12/01/16   Ivar Drape D, PA  enalapril (VASOTEC) 2.5 MG tablet TAKE 2 TABLETS BY MOUTH DAILY 05/19/16   Lorretta Harp, MD  feeding supplement, ENSURE COMPLETE, (ENSURE COMPLETE) LIQD Take 237 mLs by mouth 3 (three) times daily between meals. Patient taking differently: Take 237 mLs by mouth daily.  05/12/14   Hilton Sinclair, MD  FLUoxetine (PROZAC) 10 MG capsule TAKE 1 CAPSULE (10 MG TOTAL) BY MOUTH DAILY. 09/20/16   Donnamae Jude, MD  furosemide (LASIX) 20 MG tablet Take 1 tablet (20 mg total) by mouth daily. KEEP APPT 11/09/16   Lorretta Harp, MD  meclizine (ANTIVERT) 12.5 MG tablet TAKE 1 TABLET (12.5 MG TOTAL) BY MOUTH 3 (THREE) TIMES DAILY. 01/11/16   Donnamae Jude, MD  Multiple Vitamin (MULTI-VITAMIN DAILY PO) Take 1 tablet by mouth at bedtime.     [provider]  predniSONE (DELTASONE) 20 MG tablet Take 3 PO QAM x2days, 2 PO QAM x2days, 1 PO QAM x3days 12/01/16   Ivar Drape D, PA    Family History Family History  Problem Relation Age of Onset  . Heart disease Mother        Pacemaker  . Heart disease Father        No details.    . Hyperlipidemia Sister   . Heart disease Brother        "Heart Attacks"  Died age 6  . Cancer Daughter        Oral cancer and "knot" on right cheek that was cancer    Social History Social History  Substance Use Topics  . Smoking status: Light Tobacco Smoker    Types: Cigarettes  . Smokeless tobacco: Never  Used     Comment: Pt reports smoking about 1-2 times a week when stressed.   . Alcohol use 0.6 oz/week    1 Glasses of wine per week     Allergies   Atorvastatin; Claritin [loratadine]; and Crestor [rosuvastatin]   Review of Systems Review of Systems  Constitutional: Negative for fever.  Respiratory: Positive for shortness of breath.   Cardiovascular: Negative for chest pain and leg swelling.  Gastrointestinal: Negative for abdominal pain.  Musculoskeletal: Negative for back pain and neck pain.  Neurological: Positive for dizziness. Negative for weakness, numbness and headaches.  All other systems reviewed and are negative.    Physical Exam Updated Vital Signs BP 117/67  Pulse 92   Temp 97.9 F (36.6 C)   Resp 19   SpO2 100%   Physical Exam  Constitutional: She is oriented to person, place, and time. She appears well-developed and well-nourished.  HENT:  Head: Normocephalic and atraumatic.  Right Ear: External ear normal.  Left Ear: External ear normal.  Nose: Nose normal.  Eyes: EOM are normal. Pupils are equal, round, and reactive to light. Right eye exhibits no discharge. Left eye exhibits no discharge.  Cardiovascular: Normal rate, regular rhythm and normal heart sounds.   Pulmonary/Chest: Effort normal. She has wheezes (expiratory, diffuse).  Abdominal: Soft. There is no tenderness.  Musculoskeletal:  No joint swelling or extremity pain or decreased ROM on exam  Neurological: She is alert and oriented to person, place, and time.  CN 3-12 grossly intact. 5/5 strength in all 4 extremities. Grossly normal sensation. Normal finger to nose.   Skin: Skin is warm and dry.  Nursing note and vitals reviewed.    ED Treatments / Results  Labs (all labs ordered are listed, but only abnormal results are displayed) Labs Reviewed  COMPREHENSIVE METABOLIC PANEL - Abnormal; Notable for the following:       Result Value   Potassium 3.3 (*)    Glucose, Bld 114 (*)     Calcium 8.8 (*)    GFR calc non Af Amer 59 (*)    All other components within normal limits  CBC WITH DIFFERENTIAL/PLATELET - Abnormal; Notable for the following:    Platelets 139 (*)    All other components within normal limits    EKG  EKG Interpretation  Date/Time:  Wednesday Dec 20 2016 10:53:53 EDT Ventricular Rate:  85 PR Interval:    QRS Duration: 134 QT Interval:  419 QTC Calculation: 499 R Axis:   -31 Text Interpretation:  Sinus rhythm Atrial premature complex Left bundle branch block similar to Jun 2017 Confirmed by Sherwood Gambler [phone]) on 12/20/2016 11:44:09 AM       Radiology Dg Chest 2 View  Result Date: 12/20/2016 CLINICAL DATA:  Pain following fall.  Shortness of breath EXAM: CHEST  2 VIEW COMPARISON:  Dec 01, 2016 and December 28, 2015 FINDINGS: There are stable areas of fibrosis in the right upper and right lower lobe regions. There is no appreciable edema or consolidation. Heart is mildly enlarged with pulmonary vascular within normal limits. Pacemaker leads are attached to the right ventricle. There is aortic atherosclerosis. No adenopathy. Bones are diffusely osteoporotic. There is evidence of old trauma in the proximal right humerus. There is calcification in the left and right carotid artery lesions. IMPRESSION: Stable fibrotic type change on the right. No edema or consolidation. Stable cardiac prominence. No change in pacemaker lead placement. There is aortic atherosclerosis. Bones osteoporotic. Bilateral carotid artery calcification noted. Electronically Signed   By: Lowella Grip III M.D.   On: 12/20/2016 12:06   Ct Head Wo Contrast  Result Date: 12/20/2016 CLINICAL DATA:  83 year old female with fall, dizziness, shortness of breath. EXAM: CT HEAD WITHOUT CONTRAST TECHNIQUE: Contiguous axial images were obtained from the base of the skull through the vertex without intravenous contrast. COMPARISON:  Head and cervical spine CT 12/28/2015 and earlier. FINDINGS:  Brain: No midline shift, ventriculomegaly, mass effect, evidence of mass lesion, intracranial hemorrhage or evidence of cortically based acute infarction. Patchy hypodensity in the cerebral white matter and deep gray matter nuclei has not significantly changed since 2017. Vascular: Calcified atherosclerosis at the skull base. Skull: No acute osseous abnormality  identified. Sinuses/Orbits: Chronic bubbly opacity in the right sphenoid sinus and mild inferior mastoid fluid has not significantly changed. Other visualized paranasal sinuses are stable and well pneumatized. Bilateral tympanic cavities remain clear. Other: Stable orbit and scalp soft tissues. IMPRESSION: 1. No acute intracranial abnormality. No acute traumatic injury identified. 2. Evidence of chronic small vessel disease. No definite progression since 2017. Electronically Signed   By: Genevie Ann M.D.   On: 12/20/2016 12:49   Ct Angio Chest Pe W/cm &/or Wo Cm  Result Date: 12/20/2016 CLINICAL DATA:  Found unresponsive EXAM: CT ANGIOGRAPHY CHEST WITH CONTRAST TECHNIQUE: Multidetector CT imaging of the chest was performed using the standard protocol during bolus administration of intravenous contrast. Multiplanar CT image reconstructions and MIPs were obtained to evaluate the vascular anatomy. CONTRAST:  80 mL Isovue 370. COMPARISON:  Plain film from earlier in the same day. FINDINGS: Cardiovascular: Thoracic aorta demonstrates atherosclerotic calcifications. Incomplete opacification is noted. The pulmonary artery demonstrates a normal branching pattern without evidence of filling defect to suggest pulmonary embolism. Coronary calcifications are seen. Mild cardiac enlargement is noted. A defibrillator is seen and stable. Mediastinum/Nodes: The thoracic inlet is within normal limits. No definitive hilar or mediastinal adenopathy is seen. Lungs/Pleura: The lungs are well aerated bilaterally. Chronic appearing fibrotic changes are noted throughout the right  lung similar to that seen on prior plain film examinations. No focal confluent infiltrate is seen. No parenchymal nodules are noted. Upper Abdomen: Within normal limits. Musculoskeletal: Scoliosis concave to the left is noted in the thoracic spine. No acute compression deformity is noted. Stable mid thoracic compression deformity is seen. Review of the MIP images confirms the above findings. IMPRESSION: Chronic fibrotic changes within the lungs bilaterally right greater than left. No evidence of pulmonary emboli. Chronic bony changes. Electronically Signed   By: Inez Catalina M.D.   On: 12/20/2016 14:54    Procedures Procedures (including critical care time)  Medications Ordered in ED Medications  meclizine (ANTIVERT) tablet 25 mg (25 mg Oral Given 12/20/16 1126)  albuterol (PROVENTIL) (2.5 MG/3ML) 0.083% nebulizer solution 5 mg (5 mg Nebulization Given 12/20/16 1126)  iopamidol (ISOVUE-370) 76 % injection (80 mLs  Contrast Given 12/20/16 1429)     Initial Impression / Assessment and Plan / ED Course  I have reviewed the triage vital signs and the nursing notes.  Pertinent labs & imaging results that were available during my care of the patient were reviewed by me and considered in my medical decision making (see chart for details).     No focal findings on workup. Pacemaker interrogation shows an episode of SVT last night but does not correlate with her episode of falling today. Neuro exam is unremarkable. She was given Antivert for her dizziness which appears to be a vertigo. However when she was ambulated with oxygen and her walker she became quite dizzy and her sats dropped into the low 80s on room air. Likely related to her COPD which appears to have an acute exacerbation. She has been steadily improving with albuterol and we will continue this in the ED. She got steroids with EMS. This is probably contribute to some of her symptoms and probably her weakness. No obvious pneumonia. CT shows no  PE either. She is unable to get an MRI due to her pacemaker. Admit  Final Clinical Impressions(s) / ED Diagnoses   Final diagnoses:  Fall, initial encounter  Hypoxia    New Prescriptions New Prescriptions   No medications on file  Sherwood Gambler, MD 12/20/16 1556

## 2016-12-21 DIAGNOSIS — I5023 Acute on chronic systolic (congestive) heart failure: Secondary | ICD-10-CM

## 2016-12-21 DIAGNOSIS — J441 Chronic obstructive pulmonary disease with (acute) exacerbation: Secondary | ICD-10-CM | POA: Insufficient documentation

## 2016-12-21 DIAGNOSIS — R0902 Hypoxemia: Secondary | ICD-10-CM

## 2016-12-21 LAB — CBC
HEMATOCRIT: 36.7 % (ref 36.0–46.0)
HEMOGLOBIN: 12 g/dL (ref 12.0–15.0)
MCH: 30 pg (ref 26.0–34.0)
MCHC: 32.7 g/dL (ref 30.0–36.0)
MCV: 91.8 fL (ref 78.0–100.0)
Platelets: 138 10*3/uL — ABNORMAL LOW (ref 150–400)
RBC: 4 MIL/uL (ref 3.87–5.11)
RDW: 14.2 % (ref 11.5–15.5)
WBC: 8.9 10*3/uL (ref 4.0–10.5)

## 2016-12-21 LAB — TROPONIN I: Troponin I: <0.03 ng/mL (ref <0.03)

## 2016-12-21 LAB — BASIC METABOLIC PANEL
ANION GAP: 8 (ref 5–15)
BUN: 20 mg/dL (ref 6–20)
CALCIUM: 8.8 mg/dL — AB (ref 8.9–10.3)
CO2: 25 mmol/L (ref 22–32)
Chloride: 103 mmol/L (ref 101–111)
Creatinine, Ser: 0.84 mg/dL (ref 0.44–1.00)
Glucose, Bld: 121 mg/dL — ABNORMAL HIGH (ref 65–99)
POTASSIUM: 4.5 mmol/L (ref 3.5–5.1)
SODIUM: 136 mmol/L (ref 135–145)

## 2016-12-21 LAB — GLUCOSE, CAPILLARY: GLUCOSE-CAPILLARY: 132 mg/dL — AB (ref 65–99)

## 2016-12-21 MED ORDER — METOPROLOL TARTRATE 12.5 MG HALF TABLET
12.5000 mg | ORAL_TABLET | Freq: Two times a day (BID) | ORAL | Status: DC
  Administered 2016-12-21 – 2016-12-24 (×5): 12.5 mg via ORAL
  Filled 2016-12-21 (×6): qty 1

## 2016-12-21 MED ORDER — FUROSEMIDE 10 MG/ML IJ SOLN
20.0000 mg | Freq: Once | INTRAMUSCULAR | Status: AC
  Administered 2016-12-21: 20 mg via INTRAVENOUS
  Filled 2016-12-21: qty 2

## 2016-12-21 NOTE — Evaluation (Addendum)
Physical Therapy Evaluation Patient Details Name: Brianna [name] MRN: [id number] DOB: [birth year] Today's Date: 12/21/2016   History of Present Illness  83 yo F who lives alone at home brought to ED due to vertigo symptoms and mechanical fall and inability to get back to her feet on her home  Clinical Impression  Patient presents s/p fall at home with some concern for continued fall risk and currently does not have 24 hour assist at home.  Reports has good safety measures at home to decrease fall risk and aide at home until mid afternoon, but continue to feel she would be at risk to fall.  Recommend SNF level rehab at this time.  Will follow up and update recommendations if mobility improves.     Follow Up Recommendations SNF;Supervision/Assistance - 24 hour    Equipment Recommendations  None recommended by PT    Recommendations for Other Services       Precautions / Restrictions Precautions Precautions: Fall      Mobility  Bed Mobility Overal bed mobility: Needs Assistance Bed Mobility: Sit to Supine;Supine to Sit     Supine to sit: Min guard;HOB elevated Sit to supine: Min assist   General bed mobility comments: assist for scooting up in bed  Transfers Overall transfer level: Needs assistance Equipment used: Rolling walker (2 wheeled) Transfers: Sit to/from Stand   Stand pivot transfers: Min guard       General transfer comment: for balance/episode of anterior LOB standing for orthostatics with S and UE support for recovery  Ambulation/Gait Ambulation/Gait assistance: Min assist Ambulation Distance (Feet): 75 Feet (x 2 seated rest needed halfway) Assistive device: Rolling walker (2 wheeled) Gait Pattern/deviations: Step-through pattern;Step-to pattern;Decreased stride length;Trunk flexed     General Gait Details: slow pace, noted dyspnea with ambulation, RN aware SpO2 86% with ambulation, back to 93% seated rest  Stairs            Wheelchair  Mobility    Modified Rankin (Stroke Patients Only)       Balance Overall balance assessment: Needs assistance   Sitting balance-Leahy Scale: Good     Standing balance support: Bilateral upper extremity supported Standing balance-Leahy Scale: Poor Standing balance comment: UE support for balance with static balance loss during orthostatics                             Pertinent Vitals/Pain Pain Assessment: No/denies pain  Orthostatic VS for the past 24 hrs (Last 3 readings):  BP- Lying Pulse- Lying BP- Sitting Pulse- Sitting BP- Standing at 0 minutes Pulse- Standing at 0 minutes BP- Standing at 3 minutes Pulse- Standing at 3 minutes  12/21/16 1230 126/76 73 134/87 86 133/61 92 - -  12/21/16 1026 - - 120/65 - 120/63 - 95/75 -  12/21/16 0654 121/54 70 135/86 78 122/82 70 107/73 91      Home Living Family/patient expects to be discharged to:: Private residence Living Arrangements: Alone Available Help at Discharge: Home health;Other (Comment);Personal care attendant Type of Home: House Home Access: Stairs to enter Entrance Stairs-Rails: Right Entrance Stairs-Number of Steps: 5 Home Layout: One level Home Equipment: Walker - 2 wheels;Bedside commode;Tub bench      Prior Function Level of Independence: Independent with assistive device(s)         Comments: aide helps with cleaning, medications, shopping, appointments     Hand Dominance        Extremity/Trunk Assessment   Upper Extremity  Assessment Upper Extremity Assessment: Defer to OT evaluation    Lower Extremity Assessment Lower Extremity Assessment: LLE deficits/detail;RLE deficits/detail RLE Deficits / Details: AROM WFL, strength grossly 4 to 4+/5  RLE Sensation: history of peripheral neuropathy LLE Deficits / Details: AROM WFL, strength grossly 4 to 4+/5  LLE Sensation: history of peripheral neuropathy       Communication   Communication: No difficulties  Cognition Arousal/Alertness:  Awake/alert Behavior During Therapy: WFL for tasks assessed/performed Overall Cognitive Status: Within Functional Limits for tasks assessed                                        General Comments      Exercises     Assessment/Plan    PT Assessment Patient needs continued PT services  PT Problem List Decreased strength;Decreased activity tolerance;Decreased balance;Decreased mobility;Cardiopulmonary status limiting activity;Decreased knowledge of use of DME;Decreased safety awareness       PT Treatment Interventions DME instruction;Gait training;Therapeutic exercise;Patient/family education;Therapeutic activities;Functional mobility training;Stair training;Balance training    PT Goals (Current goals can be found in the Care Plan section)  Acute Rehab PT Goals Patient Stated Goal: To go home PT Goal Formulation: With patient/family Time For Goal Achievement: 01/04/17 Potential to Achieve Goals: Good    Frequency Min 3X/week   Barriers to discharge        Co-evaluation               AM-PAC PT "6 Clicks" Daily Activity  Outcome Measure Difficulty turning over in bed (including adjusting bedclothes, sheets and blankets)?: None Difficulty moving from lying on back to sitting on the side of the bed? : Total Difficulty sitting down on and standing up from a chair with arms (e.g., wheelchair, bedside commode, etc,.)?: Total Help needed moving to and from a bed to chair (including a wheelchair)?: A Little Help needed walking in hospital room?: A Little Help needed climbing 3-5 steps with a railing? : A Little 6 Click Score: 15    End of Session Equipment Utilized During Treatment: Gait belt Activity Tolerance: Patient limited by fatigue Patient left: in bed;with call bell/phone within reach;with bed alarm set;with family/visitor present   PT Visit Diagnosis: Other abnormalities of gait and mobility (R26.89);Muscle weakness (generalized) (M62.81)     Time: 4680-3212 PT Time Calculation (min) (ACUTE ONLY): 29 min   Charges:   PT Evaluation $PT Eval Moderate Complexity: 1 Procedure PT Treatments $Gait Training: 8-22 mins   PT G CodesMagda Kiel, [place] [phone] 12/21/2016   Reginia Naas 12/21/2016, 4:10 PM

## 2016-12-21 NOTE — Progress Notes (Signed)
EKG obtained. P-waves present. No A-fib per review of EKG. HR of 70.   Brianna [name]

## 2016-12-21 NOTE — Progress Notes (Signed)
Admitted pt.from ED ,alert ,oriented x 4,no acute distress noted.V/S taken & recorded.IV in place w/ occlusive dsd.intact,no redness noted.Oriented pt.to the room,call bell & information packet given to pt.Fall assessment completed w/ pt.& able to verbalized understanding of risks associated w/ falls.& verbalized understanding to call nurse before getting OOB.Call light with in reach Skin  Dry & intact.Will continue to monitor pt.

## 2016-12-21 NOTE — Progress Notes (Signed)
Family Medicine Teaching Service Daily Progress Note Intern Pager: [phone]  Patient name: Brianna [name] Medical record number: [id number] Date of birth: October 18, 1929 Age: 83 y.o. Gender: female  Primary Care Provider: Donnamae Jude, MD Consultants: None Code Status: DNR  Pt Overview and Major Events to Date:  Admit 5/30  Assessment and Plan: SHANECA [name] is a 83 y.o. female presenting with dizziness and mechanical fall.  PMH is significant for HLD, HTN, HFrEF, h/o breast ca, h/o CAD with pacemaker, depression, COPD, insomnia and allergies.     Fall 2/2 dizziness (vertigo): second fall this year. Turned around in kitchen and fell. No prodromal symptoms. Patient notes she hit her head but did not lose consciousness and did not have any preceding symptoms. Not sure if she had LOC. She was not able to get up. Fall was not witnessed as she lives alone.  With no suspicion that this a syncopal episode.   She has been evaluated in the past for falls and had a recent admission on 12/2015 for similar.   CT head negative for acute intracranial abnormalities.  Evidence of chronic small vessel disease but no definite progression since 2017.  On arrival, patient afebrile with no white count.  Labs notable for low platelets of 139 (previously 274-300) and hypokalemia to 3.3. EKG with left bundle branch block but per chart review of EKG from  2017 this is not new.  ACS ruled out with no acute EKG findings and troponins negative x3.   In the ED she was given Meclizine with some improvement in her symptoms.   Orthostatic vitals found to be positive this AM.   -Monitor on tele  -AM BMET and CBC  -Echo pending  -PT/OT recs  -Fall precautions  -vitals per unit routine    DOE likely 2/2 CHF exacerbation.  Patient with desats to 80's in the ED but noted only with ambulation and does not require O2 at rest. Not on O2 at home.  Reports she has noticed dyspnea on exertion when walking to the mailbox. Denies  chest pain, palpitations and leg swelling. Denies orthopnea and PND.  CXR with stable type changes on the right, no edema or consolidation.  CT Angio with chronic fibrotic changes within lungs bilaterally R>L. No evidence of PE. Do not suspect this is due to CHF exacerbation as she has no signs of fluid overload on exam except bilateral crackles which seems chronic per cardiology note. However BNP returned 833 so will treat as CHF exacerbation.   -Echo pending  -Give IV Lasix 20 mg x1 and reassess fluid status  -Monitor respiratory status  -Dulera 2 puffs BID -Albuterol nebulizer Q4 PRN  -Hold off steroid and antibiotics  -Add Spiriva   Hypokalemia.  Resolved.  K+ this AM 4.5.  -Daily BMET  H/o CAD with Pacemaker.   At home on daily ASA and Coreg.  Patient currently denies chest pain and palpitations.  Pacemaker was interrogated and found to have SVT the night preceding the fall however this is likely unrelated to her dizziness and fall.   -Continue ASA 81 mg -Will change Coreg to Metoprolol 12.5 mg BID as it is more cardioselective   HLD .  Stable.  Lipid panel from 2016 with LDL 77, HDL 81, VLDL 12 and total cholesterol 170.   Not on a statin medication due to intolerance.   On chart noted to be allergic.   HTN. Normotensive on admission with BP 121/68.  At home on Coreg  and Enalapril.  -Hold Enalapril for now as she is normotensive.  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  NICM secondary to [facility] chemo. She is followed by Dr. Sallyanne Kuster. Last echo 12/2015 with EF 40-45%.  She is at home on Lasix 20 mg daily.  She does not have any signs of volume overload on exam to suggest CHF exacerbation at this time however is exhibiting dyspnea on exertion.  BNP 833.   -Will give IV Lasix 20 mg x1 as above.  -Echo pending  -I's and O's -daily weights -Monitor fluid status  Depression.  Stable.  At home on Prozac 10 mg daily.  -Continue home med  FEN/GI: Heart diet, SLIV    Prophylaxis: Lovenox  Disposition: Continue to monitor on inpatient.   Subjective:  Patient feels well this morning.  She reports no chest pain or shortness of breath.  Slept well.  Still does report some dizziness but this occurs only when she goes from laying to sitting position and then resolves on its own immediately afterwards.   Objective: Temp:  [97.7 F (36.5 C)] 97.7 F (36.5 C) (05/31 0654) Pulse Rate:  [70-102] 70 (05/31 0654) Resp:  [15-28] 20 (05/31 0654) BP: (97-136)/(54-92) 121/54 (05/31 0654) SpO2:  [93 %-100 %] 94 % (05/31 1026) Weight:  [134 lb 4.2 oz (60.9 kg)-136 lb 14.5 oz (62.1 kg)] 136 lb 14.5 oz (62.1 kg) (05/31 0040) Physical Exam: General: pleasant 83 yo F, sitting in bed, NAD  Eyes: EOMI, PERRL ENTM: MMM, o/p clear  Neck: supple Cardiovascular: RRR no MRG, pacemaker in place, 2+ pulses, no edema, no JVD or HJR Respiratory: bibasilar crackles appreciated, no increased work of breathing on RA  Gastrointestinal: soft, NTND, +bs  MSK: no edema or tenderness noted  Derm: warm, dry Neuro: AOx4, no focal deficits, 5/5 strength bilaterally in upper and lower extremities, sensation intact  Psych: normal affect   Laboratory:  Recent Labs Lab 12/20/16 1131 12/21/16 0619  WBC 9.8 8.9  HGB 13.3 12.0  HCT 39.7 36.7  PLT 139* 138*    Recent Labs Lab 12/20/16 1131 12/21/16 0619  NA 136 136  K 3.3* 4.5  CL 102 103  CO2 24 25  BUN 19 20  CREATININE 0.88 0.84  CALCIUM 8.8* 8.8*  PROT 7.2  --   BILITOT 1.2  --   ALKPHOS 66  --   ALT 16  --   AST 29  --   GLUCOSE 114* 121*   Imaging/Diagnostic Tests: Ct Angio Chest Pe W/cm &/or Wo Cm  Result Date: 12/20/2016 CLINICAL DATA:  Found unresponsive EXAM: CT ANGIOGRAPHY CHEST WITH CONTRAST TECHNIQUE: Multidetector CT imaging of the chest was performed using the standard protocol during bolus administration of intravenous contrast. Multiplanar CT image reconstructions and MIPs were obtained to evaluate  the vascular anatomy. CONTRAST:  80 mL Isovue 370. COMPARISON:  Plain film from earlier in the same day. FINDINGS: Cardiovascular: Thoracic aorta demonstrates atherosclerotic calcifications. Incomplete opacification is noted. The pulmonary artery demonstrates a normal branching pattern without evidence of filling defect to suggest pulmonary embolism. Coronary calcifications are seen. Mild cardiac enlargement is noted. A defibrillator is seen and stable. Mediastinum/Nodes: The thoracic inlet is within normal limits. No definitive hilar or mediastinal adenopathy is seen. Lungs/Pleura: The lungs are well aerated bilaterally. Chronic appearing fibrotic changes are noted throughout the right lung similar to that seen on prior plain film examinations. No focal confluent infiltrate is seen. No parenchymal nodules are noted. Upper Abdomen:  Within normal limits. Musculoskeletal: Scoliosis concave to the left is noted in the thoracic spine. No acute compression deformity is noted. Stable mid thoracic compression deformity is seen. Review of the MIP images confirms the above findings. IMPRESSION: Chronic fibrotic changes within the lungs bilaterally right greater than left. No evidence of pulmonary emboli. Chronic bony changes. Electronically Signed   By: Inez Catalina M.D.   On: 12/20/2016 14:54   Lovenia Kim, MD 12/21/2016, 12:40 PM PGY-1, [place] Intern pager: [phone], text pages welcome

## 2016-12-21 NOTE — Evaluation (Signed)
Occupational Therapy Evaluation Patient Details Name: Brianna [name] MRN: [id number] DOB: 04-13-1930 Today's Date: 12/21/2016    History of Present Illness 83 yo F who lives alone at home brought to ED due to vertigo symptoms and mechanical fall and inability to get back to her feet on her home   Clinical Impression   Pt admitted with hypoxia. Pt currently with functional limitations due to the deficits listed below (see OT Problem List).  Pt will benefit from skilled OT to increase their safety and independence with ADL and functional mobility for ADL to facilitate discharge to venue listed below.      Follow Up Recommendations  Home health OT;Supervision/Assistance - 24 hour;SNF          Precautions / Restrictions Precautions Precautions: Fall      Mobility Bed Mobility Overal bed mobility: Needs Assistance Bed Mobility: Sit to Supine       Sit to supine: Min assist      Transfers Overall transfer level: Needs assistance Equipment used: Rolling walker (2 wheeled) Transfers: Sit to/from [facility] Sit to Stand: Min assist Stand pivot transfers: Min assist       General transfer comment: VC for safety     Balance                                           ADL either performed or assessed with clinical judgement   ADL Overall ADL's : Needs assistance/impaired Eating/Feeding: Sitting;Supervision/ safety   Grooming: Standing;Minimal assistance   Upper Body Bathing: Minimal assistance;Sitting   Lower Body Bathing: Sit to/from stand;Minimal assistance   Upper Body Dressing : Set up;Sitting   Lower Body Dressing: Moderate assistance;Sit to/from stand;Cueing for safety;Cueing for sequencing   Toilet Transfer: Minimal assistance;BSC;Stand-pivot   Toileting- Clothing Manipulation and Hygiene: Minimal assistance;Sit to/from stand;Cueing for safety;Cueing for sequencing         General ADL Comments: Upon standing for  orthostatics pt became fatigued and needs to sit.      Vision Patient Visual Report: No change from baseline       Perception     Praxis      Pertinent Vitals/Pain Pain Assessment: No/denies pain     Hand Dominance     Extremity/Trunk Assessment Upper Extremity Assessment Upper Extremity Assessment: Generalized weakness           Communication Communication Communication: No difficulties   Cognition Arousal/Alertness: Awake/alert Behavior During Therapy: WFL for tasks assessed/performed Overall Cognitive Status: Within Functional Limits for tasks assessed                                     General Comments       Exercises     Shoulder Instructions      Home Living Family/patient expects to be discharged to:: Private residence Living Arrangements: Alone Available Help at Discharge: Home health;Other (Comment);Personal care attendant (9-3 only) Type of Home: House Home Access: Stairs to enter [employer] of Steps: 3   Home Layout: One level     Bathroom Shower/Tub: Teacher, early years/pre: Standard     Home Equipment: Environmental consultant - 2 wheels;Bedside commode;Tub bench          Prior Functioning/Environment Level of Independence: Independent with assistive device(s)  OT Problem List: Decreased strength;Decreased activity tolerance;Impaired balance (sitting and/or standing);Pain;Decreased knowledge of use of DME or AE      OT Treatment/Interventions: Self-care/ADL training;DME and/or AE instruction;Patient/family education    OT Goals(Current goals can be found in the care plan section)    OT Frequency: Min 2X/week   Barriers to D/C:            Co-evaluation              AM-PAC PT "6 Clicks" Daily Activity     Outcome Measure Help from another person eating meals?: None Help from another person taking care of personal grooming?: A Little Help from another person toileting, which  includes using toliet, bedpan, or urinal?: A Little Help from another person bathing (including washing, rinsing, drying)?: A Little Help from another person to put on and taking off regular upper body clothing?: A Little Help from another person to put on and taking off regular lower body clothing?: A Little 6 Click Score: 19   End of Session Equipment Utilized During Treatment: Rolling walker Nurse Communication: Mobility status  Activity Tolerance: Patient limited by fatigue Patient left: in bed;with call bell/phone within reach;with bed alarm set  OT Visit Diagnosis: Unsteadiness on feet (R26.81);Muscle weakness (generalized) (M62.81);History of falling (Z91.81)                Time: 0940-1005 OT Time Calculation (min): 25 min Charges:  OT General Charges $OT Visit: 1 Procedure OT Evaluation $OT Eval Moderate Complexity: 1 Procedure OT Treatments $Self Care/Home Management : 8-22 mins G-Codes:     Kari Baars, [place]  Payton Mccallum D 12/21/2016, 10:31 AM

## 2016-12-21 NOTE — Progress Notes (Signed)
Call from tele stating that pt had converted to a-fib, HR 79. Pt does not have a prior hx of a fib. md made aware, waiting new orders

## 2016-12-22 ENCOUNTER — Inpatient Hospital Stay (HOSPITAL_COMMUNITY): Payer: Medicare Other

## 2016-12-22 LAB — BASIC METABOLIC PANEL
ANION GAP: 7 (ref 5–15)
BUN: 21 mg/dL — ABNORMAL HIGH (ref 6–20)
CALCIUM: 9 mg/dL (ref 8.9–10.3)
CO2: 26 mmol/L (ref 22–32)
Chloride: 105 mmol/L (ref 101–111)
Creatinine, Ser: 1.04 mg/dL — ABNORMAL HIGH (ref 0.44–1.00)
GFR calc Af Amer: 56 mL/min — ABNORMAL LOW (ref >60)
GFR calc non Af Amer: 48 mL/min — ABNORMAL LOW (ref >60)
Glucose, Bld: 95 mg/dL (ref 65–99)
Potassium: 4.3 mmol/L (ref 3.5–5.1)
Sodium: 138 mmol/L (ref 135–145)

## 2016-12-22 LAB — CBC
HEMATOCRIT: 39.7 % (ref 36.0–46.0)
Hemoglobin: 12.8 g/dL (ref 12.0–15.0)
MCH: 30.5 pg (ref 26.0–34.0)
MCHC: 32.2 g/dL (ref 30.0–36.0)
MCV: 94.7 fL (ref 78.0–100.0)
Platelets: 163 10*3/uL (ref 150–400)
RBC: 4.19 MIL/uL (ref 3.87–5.11)
RDW: 14.9 % (ref 11.5–15.5)
WBC: 9.2 10*3/uL (ref 4.0–10.5)

## 2016-12-22 MED ORDER — FUROSEMIDE 10 MG/ML IJ SOLN
40.0000 mg | Freq: Once | INTRAMUSCULAR | Status: AC
  Administered 2016-12-22: 40 mg via INTRAVENOUS
  Filled 2016-12-22 (×2): qty 4

## 2016-12-22 NOTE — Progress Notes (Signed)
Patient seen today for social visit. Discussed plan of care at length as well as dangers of living alone. She is uninterested in SNF or assisted living at this time. Her 2 kids are at least an hour away. Has caregiver in home during weekdays only. She is DNR, and at peace with possible mortality. She wants to improve strength and get back to walking. Knows limitations of needing assistance with ambulation. It has been 18 months since she was last into the office for a visit. Encouraged her to come in more frequently.

## 2016-12-22 NOTE — Discharge Summary (Signed)
[facility] Discharge Summary  Patient name: MIRYAM MCELHINNEY Medical record number: [id number] Date of birth: [birth year] Age: 83 y.o. Gender: female Date of Admission: 12/20/2016  Date of Discharge: 12/24/16  Admitting Physician: Alveda Reasons, MD  Primary Care Provider: Donnamae Jude, MD Consultants: cardiology  Indication for Hospitalization: dizziness resulting in fall, hypoxia  Discharge Diagnoses/Problem List:  Dizziness Fall Vertigo Dyspnea on exertion COPD Hypokalemia CAD HLD HTN HFrEF Depression  Disposition:   Discharge Condition: Stable   Discharge Exam: see progress note from day of discharge  Brief Hospital Course:  Patient presented to ED after sustaining a fall secondary to dizziness.  She notes she hit her head but did not lose consciousness and did not have any preceding symptoms.  This was not suspected to be a syncopal episode and she was given Meclinzine in the ED with some improvement in her symptoms.  CT head negative for acute intracranial abnormalities.  She also was found to be hypoxic in the ED with desats to the 80's with ambulation but 100% at rest.  She was admitted to [facility] for further management and workup.  ACS ruled out with negative troponins and no new EKG with no new changes.  CT Angio with chronic fibrotic changes within lungs bilaterally R>L and no evidence of PE. CXR and labs not suspicious for infectious etiology.   BNP 833, however patient without any signs of fluid overload on exam except for bilateral crackles which seem to be chronic per cardiology note.  She does note dyspnea on exertion when walking to her mailbox and walking short distances.  She was subsequently treated for CHF exacerbation with IV Lasix with improvement in her symptoms. Repeat Echo showed worsening of her heart function. Cardiology was consulted and felt her syncopal event was likely not cardiogenic but will discharge her with 30 day  event monitor to assess for afib and need for anticoagulation in the future. They also commented that she may benefit from upgrade to her pacemaker to BiV device.  At time of discharge, patient was stable for discharge home with [facility] services.  Issues for Follow Up:  1. Recommend rechecking BMP to check potassium 2. Follow up with cardiology recommendations for 30 day event monitor/anticoagulation and possible upgrade to biventricular pacemaker 3. Assess fluid status, may need increased dose of Lasix 4. Patient may need oxygen supplementation at home to use during exertion  Significant Procedures: none  Significant Labs and Imaging:   Recent Labs Lab 12/20/16 1131 12/21/16 0619 12/22/16 0908  WBC 9.8 8.9 9.2  HGB 13.3 12.0 12.8  HCT 39.7 36.7 39.7  PLT 139* 138* 163    Recent Labs Lab 12/20/16 1131 12/21/16 0619 12/22/16 0908 12/24/16 0443  NA 136 136 138 135  K 3.3* 4.5 4.3 3.5  CL 102 103 105 104  CO2 24 25 26 25   GLUCOSE 114* 121* 95 89  BUN 19 20 21* 20  CREATININE 0.88 0.84 1.04* 0.82  CALCIUM 8.8* 8.8* 9.0 8.6*  ALKPHOS 66  --   --   --   AST 29  --   --   --   ALT 16  --   --   --   ALBUMIN 3.5  --   --   --    BNP 833 > 417.  Results/Tests Pending at Time of Discharge: none  Discharge Medications:  Allergies as of 12/24/2016      Reactions   Atorvastatin  Fatigue   Claritin [loratadine] Other (See Comments)   Fatigue   Crestor [rosuvastatin] Other (See Comments)   Myalgia      Medication List    STOP taking these medications   carvedilol 3.125 MG tablet Commonly known as:  COREG     TAKE these medications   ADVAIR DISKUS 100-50 MCG/DOSE Aepb Generic drug:  Fluticasone-Salmeterol INHALE 1 PUFF INTO THE LUNGS 2 (TWO) TIMES DAILY.   albuterol 108 (90 Base) MCG/ACT inhaler Commonly known as:  PROAIR HFA Inhale 2 puffs into the lungs every 6 (six) hours as needed for wheezing or shortness of breath.   aspirin EC 81 MG tablet Take 81 mg by  mouth daily.   benzonatate 100 MG capsule Commonly known as:  TESSALON Take 1-2 capsules (100-200 mg total) by mouth 3 (three) times daily as needed for cough.   cetirizine 5 MG tablet Commonly known as:  ZYRTEC Take 1 tablet (5 mg total) by mouth daily. What changed:  when to take this  reasons to take this   enalapril 2.5 MG tablet Commonly known as:  VASOTEC TAKE 2 TABLETS BY MOUTH DAILY   feeding supplement (ENSURE COMPLETE) Liqd Take 237 mLs by mouth 3 (three) times daily between meals. What changed:  when to take this  reasons to take this  additional instructions   FLUoxetine 10 MG capsule Commonly known as:  PROZAC TAKE 1 CAPSULE (10 MG TOTAL) BY MOUTH DAILY.   furosemide 20 MG tablet Commonly known as:  LASIX Take 1 tablet (20 mg total) by mouth daily. KEEP APPT What changed:  additional instructions   meclizine 12.5 MG tablet Commonly known as:  ANTIVERT TAKE 1 TABLET (12.5 MG TOTAL) BY MOUTH 3 (THREE) TIMES DAILY.   metoprolol tartrate 25 MG tablet Commonly known as:  LOPRESSOR Take 0.5 tablets (12.5 mg total) by mouth 2 (two) times daily.   potassium chloride SA 20 MEQ tablet Commonly known as:  K-DUR,KLOR-CON Take 1 tablet (20 mEq total) by mouth daily. Start taking on:  12/25/2016   predniSONE 20 MG tablet Commonly known as:  DELTASONE Take 3 PO QAM x2days, 2 PO QAM x2days, 1 PO QAM x3days       Discharge Instructions: Please refer to Patient Instructions section of EMR for full details.  Patient was counseled important signs and symptoms that should prompt return to medical care, changes in medications, dietary instructions, activity restrictions, and follow up appointments.   Follow-Up Appointments: Follow-up Information    Steve Rattler, DO. Go on 12/27/2016.   Why:  at 2:30 pm Contact information: [place] [id number] [phone]        Sanda Klein, MD. Schedule an appointment as soon as possible for a  visit.   Specialty:  Cardiology Contact information: [address] [place] [address] [place], [place], DO 12/24/2016, 2:11 PM PGY-1, [place]

## 2016-12-22 NOTE — Progress Notes (Signed)
Family Medicine Teaching Service Daily Progress Note Intern Pager: [phone]  Patient name: Brianna [name] Medical record number: [id number] Date of birth: 10-Sep-1929 Age: 83 y.o. Gender: female  Primary Care Provider: Donnamae Jude, MD Consultants: None Code Status: DNR  Pt Overview and Major Events to Date:  Admitted 5/30  Assessment and Plan: Brianna [name] is a 83 y.o. female presenting with dizziness and mechanical fall.  PMH is significant for HLD, HTN, HFrEF, h/o breast ca, h/o CAD with pacemaker, depression, COPD, insomnia and allergies.     Fall 2/2 dizziness (vertigo): improved with Meclizine in ED. Positive orthostatic vitals. -Monitor on tele  -Echo pending, scheduled for today -PT/OT recommending SNF/24 hour supervision, patient does not want to go to SNF -Fall precautions  -vitals per unit routine    DOE likely 2/2 CHF exacerbation. -stable -Echo pending  -s/p IV Lasix 20 mg x1, will likely need additional doses -Monitor respiratory status  -Dulera 2 puffs BID -Albuterol nebulizer Q4 PRN  -Hold off steroid and antibiotics  -Add Spiriva   Hypokalemia.  Resolved. BMET pending this morning -Daily BMET  H/o CAD with Pacemaker.   Stable- Pacemaker was interrogated and found to have SVT the night preceding the fall however this is likely unrelated to her dizziness and fall.   -Continue ASA 81 mg -Continue Lopressor 12.5 mg BID  HLD .  Stable.  Lipid panel from 2016 with LDL 77, HDL 81, VLDL 12 and total cholesterol 170.   Not on a statin medication due to intolerance.   On chart noted to be allergic.   HTN. Normotensive on admission with BP 121/68.  At home on Coreg and Enalapril.  -Hold Enalapril for now as she is normotensive.  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  NICM secondary to [facility] chemo. She is followed by Dr. Sallyanne Kuster. Last echo 12/2015 with EF 40-45%.  She is at home on Lasix 20 mg daily.  She does not have any signs of  volume overload on exam to suggest CHF exacerbation at this time however is exhibiting dyspnea on exertion.  BNP 833.   -Will give IV Lasix 20 mg x1 as above.  -Echo pending  -I's and O's -daily weights -Monitor fluid status  Depression.  Stable.  At home on Prozac 10 mg daily.  -Continue home med  FEN/GI: Heart diet, SLIV   Prophylaxis: Lovenox  Disposition: SNF vs home with [facility]  Subjective:  Reports feeling well, no SOB at rest, no weakness. She wants to go home. Does not want to go to SNF.   Objective: Temp:  [97.3 F (36.3 C)-98.7 F (37.1 C)] 97.3 F (36.3 C) (06/01 0547) Pulse Rate:  [64-75] 69 (06/01 0547) Resp:  [17-18] 18 (06/01 0547) BP: (106-133)/(62-65) 133/64 (06/01 0547) SpO2:  [91 %-96 %] 96 % (06/01 0547) Weight:  [136 lb 7.4 oz (61.9 kg)] 136 lb 7.4 oz (61.9 kg) (06/01 0359) Physical Exam: General: pleasant 83 yo F, sitting in bed, NAD  Eyes: EOMI, PERRL ENTM: MMM, o/p clear  Neck: supple Cardiovascular: RRR no MRG, pacemaker in place, 2+ pulses, no edema, no JVD or HJR Respiratory: bibasilar crackles appreciated, no increased work of breathing on RA  Gastrointestinal: soft, NTND, +bs  MSK: no edema or tenderness noted  Derm: warm, dry Neuro: AOx4, no focal deficits, 5/5 strength bilaterally in upper and lower extremities, sensation intact  Psych: normal affect   Laboratory:  Recent Labs Lab 12/20/16 1131 12/21/16 0619  WBC  9.8 8.9  HGB 13.3 12.0  HCT 39.7 36.7  PLT 139* 138*    Recent Labs Lab 12/20/16 1131 12/21/16 0619  NA 136 136  K 3.3* 4.5  CL 102 103  CO2 24 25  BUN 19 20  CREATININE 0.88 0.84  CALCIUM 8.8* 8.8*  PROT 7.2  --   BILITOT 1.2  --   ALKPHOS 66  --   ALT 16  --   AST 29  --   GLUCOSE 114* 121*   Imaging/Diagnostic Tests: No results found.   Steve Rattler, DO 12/22/2016, 8:25 AM PGY-1, [place] Intern pager: [phone], text pages welcome

## 2016-12-22 NOTE — NC FL2 (Signed)
[place] LEVEL OF CARE SCREENING TOOL     IDENTIFICATION  Patient Name: Brianna [name] Birthdate: July 30, 1929 Sex: female Admission Date (Current Location): 12/20/2016  [facility] and [place] Number:  Herbalist and Address:  [facility] [place], [place] [address], [place], [place] [address]      Provider Number: [id number]  Attending Physician Name and Address:  Alveda Reasons, MD  Relative Name and Phone Number:  Jeanne Ivan, daughter,     Current Level of Care: Hospital Recommended Level of Care: [place] Prior Approval Number:    Date Approved/Denied:   PASRR Number: [id number]  Discharge Plan: SNF    Current Diagnoses: Patient Active Problem List   Diagnosis Date Noted  . COPD exacerbation ([place])   . Acute on chronic systolic congestive heart failure ([place])   . Hypoxia 12/20/2016  . Fracture of right humerus 12/29/2015  . Syncope 12/28/2015  . Acute blood loss anemia 06/17/2014  . COPD (chronic obstructive pulmonary disease) ([place]) 06/10/2014  . Asthma, chronic 05/19/2014  . Constipation 05/19/2014  . Major depression, chronic 05/19/2014  . Protein-calorie malnutrition, severe ([place]) 05/12/2014  . Hip fracture, intertrochanteric ([place]) 05/10/2014  . Nonischemic cardiomyopathy ([place]) 03/24/2014  . Dyspnea 08/03/2013  . Automatic implantable cardioverter-defibrillator in situ 04/16/2013  . CYSTOCELE WITHOUT MENTION UTERINE PROLAPSE LAT 10/12/2008  . BREAST CANCER, HX OF 11/05/2007  . INSOMNIA-SLEEP DISORDER-UNSPEC 07/10/2007  . HYPERCHOLESTEROLEMIA 09/20/2006  . HYPERTENSION, BENIGN SYSTEMIC 09/20/2006  . VENTRICULAR TACHYCARDIA 09/20/2006  . Chronic diastolic heart failure ([place]) 09/20/2006  . PSORIASIS 09/20/2006  . Osteoporosis 09/20/2006    Orientation RESPIRATION BLADDER Height & Weight     Self, Situation, Place  Normal Continent Weight: 61.9 kg (136 lb 7.4 oz) Height:  5\' 2"  (157.5 cm)  BEHAVIORAL  SYMPTOMS/MOOD NEUROLOGICAL BOWEL NUTRITION STATUS      Continent Diet (Please see DC Summary)  AMBULATORY STATUS COMMUNICATION OF NEEDS Skin   Limited Assist Verbally Normal                       Personal Care Assistance Level of Assistance  Bathing, Feeding, Dressing Bathing Assistance: Limited assistance Feeding assistance: Independent Dressing Assistance: Limited assistance     Functional Limitations Info             SPECIAL CARE FACTORS FREQUENCY  PT (By licensed PT), OT (By licensed OT)     PT Frequency: 5x/week OT Frequency: 3x/week            Contractures      Additional Factors Info  Code Status, Allergies, Psychotropic Code Status Info: DNR Allergies Info: Atorvastatin, Claritin Loratadine, Crestor Rosuvastatin Psychotropic Info: Prozac         Current Medications (12/22/2016):  This is the current hospital active medication list Current Facility-Administered Medications  Medication Dose Route Frequency Provider Last Rate Last Dose  . acetaminophen (TYLENOL) tablet 650 mg  650 mg Oral Q6H PRN Lovenia Kim, MD   650 mg at 12/20/16 2250   Or  . acetaminophen (TYLENOL) suppository 650 mg  650 mg Rectal Q6H PRN Lovenia Kim, MD      . albuterol (PROVENTIL) (2.5 MG/3ML) 0.083% nebulizer solution 2.5 mg  2.5 mg Nebulization Q4H PRN Wendee Beavers T, MD   2.5 mg at 12/21/16 1437  . aspirin EC tablet 81 mg  81 mg Oral Daily Lovenia Kim, MD   81 mg at 12/21/16 0903  . benzonatate (TESSALON) capsule 100-200  mg  100-200 mg Oral TID PRN Lovenia Kim, MD      . docusate sodium (COLACE) capsule 100 mg  100 mg Oral BID Lovenia Kim, MD   100 mg at 12/21/16 2233  . enoxaparin (LOVENOX) injection 40 mg  40 mg Subcutaneous Q24H Lovenia Kim, MD   40 mg at 12/21/16 2232  . FLUoxetine (PROZAC) capsule 10 mg  10 mg Oral Daily Lovenia Kim, MD   10 mg at 12/21/16 0903  . metoprolol tartrate (LOPRESSOR) tablet 12.5 mg  12.5 mg Oral BID Tonette Bihari, MD   12.5 mg  at 12/21/16 2232  . mometasone-formoterol (DULERA) 100-5 MCG/ACT inhaler 2 puff  2 puff Inhalation BID Lovenia Kim, MD   2 puff at 12/22/16 0902  . ondansetron (ZOFRAN) tablet 4 mg  4 mg Oral Q6H PRN Lovenia Kim, MD       Or  . ondansetron (ZOFRAN) injection 4 mg  4 mg Intravenous Q6H PRN Lovenia Kim, MD      . sodium chloride flush (NS) 0.9 % injection 3 mL  3 mL Intravenous Q12H Lovenia Kim, MD   3 mL at 12/21/16 2233  . tiotropium (SPIRIVA) inhalation capsule 18 mcg  18 mcg Inhalation Daily Mercy Riding, MD   18 mcg at 12/22/16 1281     Discharge Medications: Please see discharge summary for a list of discharge medications.  Relevant Imaging Results:  Relevant Lab Results:   Additional Information SSN: [place], [place]

## 2016-12-22 NOTE — Progress Notes (Signed)
Physical Therapy Treatment Patient Details Name: Brianna [name] MRN: [id number] DOB: Mar 09, 1930 Today's Date: 12/22/2016    History of Present Illness 83 yo F who lives alone at home brought to ED due to vertigo symptoms and mechanical fall and inability to get back to her feet on her home    PT Comments    Pt presented supine in bed with HOB elevated, awake and willing to participate in therapy session. Pt seen for mobility progression. Pt with improved stability with gait this session. PT reinforced what OT educated pt and pt's family on regarding improved safety at home with use of adult diapers. Pt agreeable. Pt also agreeable to have HHPT upon d/c. Pt would continue to benefit from skilled physical therapy services at this time while admitted and after d/c to address the below listed limitations in order to improve overall safety and independence with functional mobility.    Follow Up Recommendations  Home health PT     Equipment Recommendations  3in1 (PT)    Recommendations for Other Services       Precautions / Restrictions Precautions Precautions: Fall Restrictions Weight Bearing Restrictions: No    Mobility  Bed Mobility Overal bed mobility: Needs Assistance Bed Mobility: Sit to Supine;Supine to Sit     Supine to sit: Min guard;HOB elevated Sit to supine: Min guard   General bed mobility comments: increased time, min guard for safety  Transfers Overall transfer level: Needs assistance Equipment used: Rolling walker (2 wheeled) Transfers: Sit to/from Stand Sit to Stand: Min guard;Min assist         General transfer comment: min guard for safety to stand from EOB; min A to stand from low toilet in bathroom  Ambulation/Gait Ambulation/Gait assistance: Min guard Ambulation Distance (Feet): 75 Feet (with multiple standing rest breaks) Assistive device: Rolling walker (2 wheeled) Gait Pattern/deviations: Step-through pattern;Decreased step length -  right;Decreased step length - left;Decreased stride length;Trunk flexed;Drifts right/left Gait velocity: decreased Gait velocity interpretation: Below normal speed for age/gender General Gait Details: mild instability but no overt LOB or need for physical assistance, min guard for safety and occasional vc'ing to maintain a safe distance from [employer] Mobility    Modified Rankin (Stroke Patients Only)       Balance Overall balance assessment: Needs assistance Sitting-balance support: Feet supported Sitting balance-Leahy Scale: Good     Standing balance support: Bilateral upper extremity supported Standing balance-Leahy Scale: Poor Standing balance comment: pt reliant on bilateral UEs on RW                            Cognition Arousal/Alertness: Awake/alert Behavior During Therapy: WFL for tasks assessed/performed Overall Cognitive Status: Within Functional Limits for tasks assessed                                        Exercises      General Comments        Pertinent Vitals/Pain Pain Assessment: No/denies pain    Home Living                      Prior Function            PT Goals (current goals can now be found in the care plan section) Acute  Rehab PT Goals Patient Stated Goal: To go home PT Goal Formulation: With patient/family Time For Goal Achievement: 01/04/17 Potential to Achieve Goals: Good Progress towards PT goals: Progressing toward goals    Frequency    Min 3X/week      PT Plan Discharge plan needs to be updated    Co-evaluation              AM-PAC PT "6 Clicks" Daily Activity  Outcome Measure  Difficulty turning over in bed (including adjusting bedclothes, sheets and blankets)?: None Difficulty moving from lying on back to sitting on the side of the bed? : None Difficulty sitting down on and standing up from a chair with arms (e.g., wheelchair, bedside commode,  etc,.)?: Total Help needed moving to and from a bed to chair (including a wheelchair)?: A Little Help needed walking in hospital room?: A Little Help needed climbing 3-5 steps with a railing? : A Little 6 Click Score: 18    End of Session Equipment Utilized During Treatment: Gait belt Activity Tolerance: Patient limited by fatigue Patient left: in bed;with call bell/phone within reach;with bed alarm set;with family/visitor present Nurse Communication: Mobility status PT Visit Diagnosis: Other abnormalities of gait and mobility (R26.89);Muscle weakness (generalized) (M62.81)     Time: 0630-1601 PT Time Calculation (min) (ACUTE ONLY): 25 min  Charges:  $Gait Training: 23-37 mins                    G Codes:       [place], [place], [place] 12/22/2016, 4:09 PM

## 2016-12-22 NOTE — Progress Notes (Signed)
Transitions of Care Pharmacy Note  Plan:  Educated on - New metoprolol (change from carvedilol) - New Spiriva (told patient I was not completely sure of plan for this medication and refer to discharge medication list to determine if it is to continue outpatient) - Falls with anticholinergic medications  Recommend - Consider changing metoprolol tartrate to succinate form on discharge for improved compliance and reduced ejection fraction  - Consider stopping meclizine, patient/caregiver said she hadn't been using this medication much prior to admission. Although patient did come in with dizziness, anticholinergic medications could petechiate dizziness/increase risk of falls in older adults --------------------------------------------- Brianna [name] is an 83 y.o. female who presents with a chief complaint dizziness/falls. In anticipation of discharge, pharmacy has reviewed this patient's prior to admission medication history, as well as current inpatient medications listed per the [facility].  Current medication indications, dosing, frequency, and notable side effects reviewed with patient and family. patient and family verbalized understanding of current inpatient medication regimen and is aware that the After Visit Summary when presented, will represent the most accurate medication list at discharge.   Assessment: Understanding of regimen: good Understanding of indications: good Potential of compliance: good Barriers to Obtaining Medications: No  Patient instructed to contact inpatient pharmacy team with further questions or concerns if needed.    Time spent preparing for discharge counseling: 15 minutes Time spent counseling patient: 10 minutes   Thank you for allowing pharmacy to be a part of this patient's care.  Demetrius Charity, PharmD Acute Care Pharmacy Resident  Pager: [phone] 12/22/2016

## 2016-12-22 NOTE — Evaluation (Signed)
Occupational Therapy Evaluation Patient Details Name: Brianna [name] MRN: [id number] DOB: August 08, 1929 Today's Date: 12/22/2016    History of Present Illness 83 yo F who lives alone at home brought to ED due to vertigo symptoms and mechanical fall and inability to get back to her feet on her home   Clinical Impression   Pt demonstrating ability to perform ADL and toilet transfers at a min guard level. Telebox was a definite distraction for pt as she mobilized. Educated pt and family at length in fall prevention. Recommended pt use a second 3 in 1 over her other toilet at home and consider use of adult diapers to decrease her tendency to rush to urinate. Pt has a medical alert system and assistance of an aide 6 hours a day. Pt is agreeable to [facility] therapies. Family reports pt is near her baseline.    Follow Up Recommendations  Home health OT    Equipment Recommendations  3 in 1 bedside commode    Recommendations for Other Services       Precautions / Restrictions Precautions Precautions: Fall      Mobility Bed Mobility               General bed mobility comments: pt in chair  Transfers Overall transfer level: Needs assistance Equipment used: Rolling walker (2 wheeled) Transfers: Sit to/from Stand Sit to Stand: Min guard         General transfer comment: min guard for safety, pt preoccupied with telebox, no LOB or dizziness    Balance Overall balance assessment: Needs assistance   Sitting balance-Leahy Scale: Good       Standing balance-Leahy Scale: Poor Standing balance comment: can statically stand with fair balance for ADL                           ADL either performed or assessed with clinical judgement   ADL Overall ADL's : Needs assistance/impaired Eating/Feeding: Independent;Sitting   Grooming: Wash/dry hands;Supervision/safety;Standing   Upper Body Bathing: Set up;Sitting   Lower Body Bathing: Min guard;Sit to/from stand   Upper Body  Dressing : Set up;Sitting   Lower Body Dressing: Min guard;Sit to/from stand Lower Body Dressing Details (indicate cue type and reason): able to don and doff socks crossing foot over opposite knee Toilet Transfer: Min guard;Ambulation;RW;Regular Museum/gallery exhibitions officer and Hygiene: Sit to/from stand;Min guard       Functional mobility during ADLs: Min guard;Rolling walker (veers L) General ADL Comments: fatigues with walking in hall for HR and 02 sats with OT and RN     Vision         Perception     Praxis      Pertinent Vitals/Pain Pain Assessment: No/denies pain     Hand Dominance     Extremity/Trunk Assessment             Communication     Cognition Arousal/Alertness: Awake/alert Behavior During Therapy: WFL for tasks assessed/performed Overall Cognitive Status: Within Functional Limits for tasks assessed                                     General Comments       Exercises     Shoulder Instructions      Home Living  Prior Functioning/Environment                   OT Problem List:        OT Treatment/Interventions:      OT Goals(Current goals can be found in the care plan section) Acute Rehab OT Goals Patient Stated Goal: To go home  OT Frequency: Min 2X/week   Barriers to D/C:            Co-evaluation              AM-PAC PT "6 Clicks" Daily Activity     Outcome Measure Help from another person eating meals?: None Help from another person taking care of personal grooming?: A Little Help from another person toileting, which includes using toliet, bedpan, or urinal?: A Little Help from another person bathing (including washing, rinsing, drying)?: A Little Help from another person to put on and taking off regular upper body clothing?: None Help from another person to put on and taking off regular lower body clothing?: A Little 6 Click  Score: 20   End of Session Equipment Utilized During Treatment: Gait belt;Rolling walker  Activity Tolerance: Patient tolerated treatment well Patient left: in chair;with call bell/phone within reach;with chair alarm set;with family/visitor present  OT Visit Diagnosis: Unsteadiness on feet (R26.81);Muscle weakness (generalized) (M62.81);History of falling (Z91.81)                Time: 8003-4917 OT Time Calculation (min): 39 min Charges:  OT General Charges $OT Visit: 1 Procedure OT Treatments $Self Care/Home Management : 38-52 mins G-Codes:     Malka So 12/22/2016, 3:37 PM  [phone]

## 2016-12-23 ENCOUNTER — Encounter (HOSPITAL_COMMUNITY): Payer: Self-pay | Admitting: Cardiology

## 2016-12-23 ENCOUNTER — Inpatient Hospital Stay (HOSPITAL_COMMUNITY): Payer: Medicare Other

## 2016-12-23 DIAGNOSIS — Z853 Personal history of malignant neoplasm of breast: Secondary | ICD-10-CM

## 2016-12-23 DIAGNOSIS — I447 Left bundle-branch block, unspecified: Secondary | ICD-10-CM | POA: Diagnosis present

## 2016-12-23 DIAGNOSIS — I428 Other cardiomyopathies: Secondary | ICD-10-CM

## 2016-12-23 DIAGNOSIS — I10 Essential (primary) hypertension: Secondary | ICD-10-CM

## 2016-12-23 DIAGNOSIS — I4891 Unspecified atrial fibrillation: Secondary | ICD-10-CM | POA: Diagnosis present

## 2016-12-23 DIAGNOSIS — J439 Emphysema, unspecified: Secondary | ICD-10-CM

## 2016-12-23 DIAGNOSIS — R0902 Hypoxemia: Secondary | ICD-10-CM

## 2016-12-23 DIAGNOSIS — Z95810 Presence of automatic (implantable) cardiac defibrillator: Secondary | ICD-10-CM

## 2016-12-23 LAB — ECHOCARDIOGRAM COMPLETE
CHL CUP DOP CALC LVOT VTI: 11 cm
FS: 13 % — AB (ref 28–44)
HEIGHTINCHES: 62 in
IVS/LV PW RATIO, ED: 1
LA ID, A-P, ES: 29 mm
LA diam index: 1.74 cm/m2
LAVOLA4C: 26.1 mL
LDCA: 3.46 cm2
LEFT ATRIUM END SYS DIAM: 29 mm
LV PW d: 12 mm — AB (ref 0.6–1.1)
LV SIMPSON'S DISK: 31
LV dias vol index: 57 mL/m2
LV dias vol: 95 mL (ref 46–106)
LV sys vol index: 40 mL/m2
LV sys vol: 66 mL — AB (ref 14–42)
LVOT SV: 38 mL
LVOT peak grad rest: 1 mmHg
LVOTD: 21 mm
LVOTPV: 53.2 cm/s
RV LATERAL S' VELOCITY: 6.96 cm/s
RV TAPSE: 13.8 mm
RV sys press: 34 mmHg
Reg peak vel: 278 cm/s
Stroke v: 29 ml
TR max vel: 278 cm/s
WEIGHTICAEL: 2271.62 [oz_av]

## 2016-12-23 NOTE — Consult Note (Signed)
Cardiology Consultation:   Patient ID: Brianna [name]; [id number]; [birth year]   Admit date: 12/20/2016 Date of Consult: 12/23/2016  Primary Care Provider: Donnamae Jude, MD Primary Cardiologist: Dr Gwenlyn Found Primary Electrophysiologist:  Dr Sallyanne Kuster   Patient Profile:   Brianna [name] is a 83 y.o. female with a hx of NICM who is being seen today for the evaluation of CHF and new drop in LVF at the request of Dr Mingo Amber.  History of Present Illness:   Brianna [name] is a 83 year old thin appearing widow who lives alone in her own home. She has a history of a nonischemic cardiomyopathy related to chemotherapy for breast cancer >10 years ago. She had an ICD placed for primary prevention by Dr. Elvis Coil in 2006.She had a [employer] lead fracture that was replaced and a device change out to a MDT [place] in 2011. Dr Sallyanne Kuster last saw her in Sept 2017 and her ICD was functioning appropriately. She has never received therapies from her device.  Her most recent 2D echo performed in our office on December 29 2015 revealed an EF of 40-45% with mild global hypokinesia and septal wall motion abnormality consistent with a conduction abnormality.  She was admitted 12/20/16 after a fall at home. The pt tells me she was standing in her kitchen and "blacked out". She says she was on the floor for two hours. She denies any pre syncope chest pain or tachycardia. Her EKG on admission showed NSR with LBBB (old) but within 24 hours telemetry showed AF with CVR. She is also felt to have CHF (BNP 833) and a drop in her EF to 25-30%.  She admits to gradually worsening DOE.    Past Medical History:  Diagnosis Date  . AICD (automatic cardioverter/defibrillator) present   . Anxiety   . Arthritis    "lower back; knees" (12/20/2016)  . Cancer of left breast ([place])   . Depression   . Emphysema of lung ([place])   . Falls frequently    12/20/2016 "couple falls since 07/2016"  . Heart murmur   . Hyperlipidemia   .  Hypertension   . Nonischemic cardiomyopathy ([place])   . Osteopenia   . Pneumonia ~ 3662-9476 X 3  . Presence of permanent cardiac pacemaker     Past Surgical History:  Procedure Laterality Date  . APPENDECTOMY  1943  . BREAST BIOPSY Left  [id number]  . CARDIAC CATHETERIZATION  12/18/2002   Normal coronary arteries and normal LV function  . CARDIAC DEFIBRILLATOR PLACEMENT  10/13/2004   Medtronic Maximo VR, model Q7220614, serial [id number]  . CATARACT EXTRACTION W/ INTRAOCULAR LENS  IMPLANT, BILATERAL    . FEMUR IM NAIL Right 05/10/2014   Procedure: INTRAMEDULLARY (IM) NAIL FEMORAL;  Surgeon: Marianna Payment, MD;  Location: [facility];  Service: Orthopedics;  Laterality: Right;  . FRACTURE SURGERY    . MASTECTOMY Left  [id number]  . PACEMAKER INSERTION  06/17/2010   Medtronic Protecta VR, model #D334VRG, serial [id number]  . TONSILLECTOMY  1940  . TUBAL LIGATION       Inpatient Medications: Scheduled Meds: . aspirin EC  81 mg Oral Daily  . docusate sodium  100 mg Oral BID  . enoxaparin (LOVENOX) injection  40 mg Subcutaneous Q24H  . FLUoxetine  10 mg Oral Daily  . metoprolol tartrate  12.5 mg Oral BID  . mometasone-formoterol  2 puff Inhalation BID  . sodium chloride flush  3 mL Intravenous Q12H  . tiotropium  18 mcg  Inhalation Daily   Continuous Infusions:  PRN Meds: acetaminophen **OR** acetaminophen, albuterol, benzonatate, ondansetron **OR** ondansetron (ZOFRAN) IV  Allergies:    Allergies  Allergen Reactions  . Atorvastatin     Fatigue   . Claritin [Loratadine] Other (See Comments)    Fatigue   . Crestor [Rosuvastatin] Other (See Comments)    Myalgia    Social History:   Social History   Social History  . Marital status: Widowed    Spouse name: N/A  . Number of children: 2  . Years of education: 11   Occupational History  . Retired-Manager Writer. Retired   Social History Main Topics  . Smoking status: Light Tobacco Smoker    Packs/day: 0.10     Years: 44.00    Types: Cigarettes  . Smokeless tobacco: Never Used  . Alcohol use 3.0 oz/week    5 Glasses of wine per week  . Drug use: No  . Sexual activity: No   Other Topics Concern  . Not on file   Social History Narrative   Health Care POA:    Emergency Contact: friend, Trevor Iha, [phone]   End of Life Plan:    Who lives with you: self, has medical alert button    Any pets: none   Diet: Patient has a varied diet and controls portions well.   Exercise: Patient does not have any regular exercise routine.   Seatbelts: Patient reports wearing seatbelt when in vehicle.   Nancy Fetter Exposure/Protection: Patient reports wearing sun screening daily.   Hobbies: clothes alterations                Family History:   The patient's family history includes Cancer in her daughter; Heart disease in her brother, father, and mother; Hyperlipidemia in her sister.  ROS:  Please see the history of present illness.  ROS  All other ROS reviewed and negative.     Physical Exam/Data:   Vitals:   12/23/16 0624 12/23/16 0801 12/23/16 1255 12/23/16 1453  BP: 109/71   (!) 113/99  Pulse: 71   77  Resp: 16   20  Temp: 97.3 F (36.3 C)   97.4 F (36.3 C)  TempSrc:    Oral  SpO2: 96% 97% 90% 99%  Weight:      Height:        Intake/Output Summary (Last 24 hours) at 12/23/16 1518 Last data filed at 12/23/16 1300  Gross per 24 hour  Intake                0 ml  Output              500 ml  Net             -500 ml   Filed Weights   12/21/16 0040 12/22/16 0359 12/23/16 0009  Weight: 136 lb 14.5 oz (62.1 kg) 136 lb 7.4 oz (61.9 kg) 141 lb 15.6 oz (64.4 kg)   Body mass index is 25.97 kg/m.  General:  Well nourished, well developed, in no acute distress HEENT: normal Lymph: no adenopathy Neck: no JVD Endocrine:  No thryomegaly Vascular: No carotid bruits; FA pulses 2+ bilaterally without bruits  Cardiac:  irreg irreg Lungs:  Faint crackles bliateraly Abd: soft, nontender, no  hepatomegaly  Ext: no edema Musculoskeletal:  No deformities, BUE and BLE strength normal and equal Skin: pale, warm and dry  Neuro:  CNs 2-12 intact, no focal abnormalities noted Psych:  Normal affect  EKG:  The EKG was personally reviewed and demonstrates AF with LBBB  Relevant CV Studies: Echo 12/21/16- Study Conclusions  - Left ventricle: The cavity size was mildly dilated. Wall   thickness was increased in a pattern of mild LVH. Systolic   function was severely reduced. The estimated ejection fraction   was in the range of 25% to 30%. Diffuse hypokinesis. The study is   not technically sufficient to allow evaluation of LV diastolic   function. - Aortic valve: Trileaflet; mildly thickened, mildly calcified   leaflets. - Mitral valve: There was mild regurgitation. - Pulmonary arteries: Systolic pressure was mildly increased. PA   peak pressure: 34 mm Hg (S). - Pericardium, extracardiac: A trivial pericardial effusion was   identified posterior to the heart. There was no evidence of   hemodynamic compromise.  Impressions:  - When compared to prior echocardiogram, EF is reduced.  Laboratory Data:  Chemistry Recent Labs Lab 12/20/16 1131 12/21/16 0619 12/22/16 0908  NA 136 136 138  K 3.3* 4.5 4.3  CL 102 103 105  CO2 24 25 26   GLUCOSE 114* 121* 95  BUN 19 20 21*  CREATININE 0.88 0.84 1.04*  CALCIUM 8.8* 8.8* 9.0  GFRNONAA 59* >60 48*  GFRAA >60 >60 56*  ANIONGAP 10 8 7      Recent Labs Lab 12/20/16 1131  PROT 7.2  ALBUMIN 3.5  AST 29  ALT 16  ALKPHOS 66  BILITOT 1.2   Hematology Recent Labs Lab 12/20/16 1131 12/21/16 0619 12/22/16 0908  WBC 9.8 8.9 9.2  RBC 4.33 4.00 4.19  HGB 13.3 12.0 12.8  HCT 39.7 36.7 39.7  MCV 91.7 91.8 94.7  MCH 30.7 30.0 30.5  MCHC 33.5 32.7 32.2  RDW 14.1 14.2 14.9  PLT 139* 138* 163   Cardiac Enzymes Recent Labs Lab 12/20/16 1854 12/21/16 0057 12/21/16 0619  TROPONINI <0.03 <0.03 <0.03   No results  for input(s): TROPIPOC in the last 168 hours.  BNP Recent Labs Lab 12/20/16 2053  BNP 833.8*    DDimer No results for input(s): DDIMER in the last 168 hours.  Radiology/Studies:  Dg Chest 2 View  Result Date: 12/20/2016 CLINICAL DATA:  Pain following fall.  Shortness of breath EXAM: CHEST  2 VIEW COMPARISON:  Dec 01, 2016 and December 28, 2015 FINDINGS: There are stable areas of fibrosis in the right upper and right lower lobe regions. There is no appreciable edema or consolidation. Heart is mildly enlarged with pulmonary vascular within normal limits. Pacemaker leads are attached to the right ventricle. There is aortic atherosclerosis. No adenopathy. Bones are diffusely osteoporotic. There is evidence of old trauma in the proximal right humerus. There is calcification in the left and right carotid artery lesions. IMPRESSION: Stable fibrotic type change on the right. No edema or consolidation. Stable cardiac prominence. No change in pacemaker lead placement. There is aortic atherosclerosis. Bones osteoporotic. Bilateral carotid artery calcification noted. Electronically Signed   By: Lowella Grip III M.D.   On: 12/20/2016 12:06   Ct Head Wo Contrast  Result Date: 12/20/2016 CLINICAL DATA:  83 year old female with fall, dizziness, shortness of breath. EXAM: CT HEAD WITHOUT CONTRAST TECHNIQUE: Contiguous axial images were obtained from the base of the skull through the vertex without intravenous contrast. COMPARISON:  Head and cervical spine CT 12/28/2015 and earlier. FINDINGS: Brain: No midline shift, ventriculomegaly, mass effect, evidence of mass lesion, intracranial hemorrhage or evidence of cortically based acute infarction. Patchy hypodensity in the cerebral white matter and deep  gray matter nuclei has not significantly changed since 2017. Vascular: Calcified atherosclerosis at the skull base. Skull: No acute osseous abnormality identified. Sinuses/Orbits: Chronic bubbly opacity in the right  sphenoid sinus and mild inferior mastoid fluid has not significantly changed. Other visualized paranasal sinuses are stable and well pneumatized. Bilateral tympanic cavities remain clear. Other: Stable orbit and scalp soft tissues. IMPRESSION: 1. No acute intracranial abnormality. No acute traumatic injury identified. 2. Evidence of chronic small vessel disease. No definite progression since 2017. Electronically Signed   By: Genevie Ann M.D.   On: 12/20/2016 12:49   Ct Angio Chest Pe W/cm &/or Wo Cm  Result Date: 12/20/2016 CLINICAL DATA:  Found unresponsive EXAM: CT ANGIOGRAPHY CHEST WITH CONTRAST TECHNIQUE: Multidetector CT imaging of the chest was performed using the standard protocol during bolus administration of intravenous contrast. Multiplanar CT image reconstructions and MIPs were obtained to evaluate the vascular anatomy. CONTRAST:  80 mL Isovue 370. COMPARISON:  Plain film from earlier in the same day. FINDINGS: Cardiovascular: Thoracic aorta demonstrates atherosclerotic calcifications. Incomplete opacification is noted. The pulmonary artery demonstrates a normal branching pattern without evidence of filling defect to suggest pulmonary embolism. Coronary calcifications are seen. Mild cardiac enlargement is noted. A defibrillator is seen and stable. Mediastinum/Nodes: The thoracic inlet is within normal limits. No definitive hilar or mediastinal adenopathy is seen. Lungs/Pleura: The lungs are well aerated bilaterally. Chronic appearing fibrotic changes are noted throughout the right lung similar to that seen on prior plain film examinations. No focal confluent infiltrate is seen. No parenchymal nodules are noted. Upper Abdomen: Within normal limits. Musculoskeletal: Scoliosis concave to the left is noted in the thoracic spine. No acute compression deformity is noted. Stable mid thoracic compression deformity is seen. Review of the MIP images confirms the above findings. IMPRESSION: Chronic fibrotic changes  within the lungs bilaterally right greater than left. No evidence of pulmonary emboli. Chronic bony changes. Electronically Signed   By: Inez Catalina M.D.   On: 12/20/2016 14:54    Assessment and Plan:   1. Syncope and collapse- r/o arrhythmia  2. NICM- new drop in EF. She might benefit from a BiV upgrade at some point  3. Acute CHF- fairly stable on exam today after 1.7L diuresis  4. AF- new for her as far as I can tell. CHA2DS2 VASc= 5. Will discuss anticoagulation with MD.   5. LBBB- chronic  6. MDT ICD- I have left a message with pacer rep to have device interrogated  7. COPD-    Signed, Kerin Ransom, PA-C  12/23/2016 3:18 PM   I have seen and examined the patient along with Kerin Ransom, PA-C.   I have reviewed the chart, notes and new data.  I agree with PA's note.  Key new complaints: Presentation is highly compatible with arrhythmic syncope, rather than vertigo. Found herself on the floor without recalling falling. No prodrome. Does not recall being shocked by her defibrillator. Key examination changes: No serious injury, paradoxically split second heart sound, no evidence of edema, jugular venous distention, rales or S3. Key new findings / data: Echo reports drop in EF to 25-30%. ECG automatic interpretation shows atrial flutter, but the rhythm is actually sinus rhythm, left bundle branch block, QRS 132 ms. I have reviewed her telemetry tracings. The rhythm is occasionally irregular but the QRS morphology does not change. The lead that is recorded shows the P-wave very poorly. I don't see comments and evidence of atrial fibrillation in my opinion.  PLAN: First step  would be to interrogate her defibrillator to see if there was any evidence of tachycardia arrhythmia that could've caused syncope. As far as I can tell, interrogation has not yet been performed. As long as the device is functioning normally, she should not have had bradycardia as a cause of her syncope. Her device is  set up with backup ventricular pacing at 40 bpm. She has never required ventricular pacing in the past. Unfortunately, she has a single chamber device and interrogation will not help in confirming or denying the occurrence of atrial fibrillation. She makes it clear that she does not want "life support". Specifically she would never want to be placed on a mechanical ventilator or receive dialysis. She seems a little more ambivalent about CPR or device shocks, but she has a defibrillator in place anyway. At this point she does not request that the defibrillator therapies be turned off. She does not appear to be in overt heart failure, despite the drop in left ventricular ejection fraction. On review of her echo, there is profound left ventricular dyssynchrony due to left bundle branch block. She would clearly benefit from cardiac resynchronization therapy (biventricular upgrade). I'm not sure that she would want to undergo invasive procedure such as this. We did not address that directly today.  Sanda Klein, MD, [place] [phone] 12/23/2016, 4:47 PM

## 2016-12-23 NOTE — Progress Notes (Signed)
02 sat 97% prior to ambulating. Pt ambulated approx. 232ft 02 sat dropped to 83-84% a couple of times but came right back up to 87-88%. Currently resting with 02 sat at 100% on continuous pulse ox.

## 2016-12-23 NOTE — Progress Notes (Signed)
Family Medicine Teaching Service Daily Progress Note Intern Pager: [phone]  Patient name: Brianna [name] Medical record number: [id number] Date of birth: 01/04/1930 Age: 83 y.o. Gender: female  Primary Care Provider: Donnamae Jude, MD Consultants: None Code Status: DNR  Pt Overview and Major Events to Date:  Admitted 5/30  Assessment and Plan: Brianna [name] is a 83 y.o. female presenting with dizziness and mechanical fall.  PMH is significant for HLD, HTN, HFrEF, h/o breast ca, h/o CAD with pacemaker, depression, COPD, insomnia and allergies.     Fall 2/2 dizziness (vertigo): Improved with Meclizine in ED. Positive orthostatic vitals. -Echo today -PT/OT recommending SNF/24 hour supervision, patient does not want to go to SNF -Fall precautions  -Vitals per unit routine    DOE likely 2/2 CHF exacerbation. Resolved. Now s/p IV Lasix x2.   -Echo pending  -Monitor respiratory status  -Dulera 2 puffs BID -Albuterol nebulizer Q4 PRN  -Hold off steroid and antibiotics  -Added Spiriva   Hypokalemia.  Resolved. K 4.3 (06/01).  -AM BMP  H/o CAD with Pacemaker. Stable. Pacemaker was interrogated and found to have SVT the night preceding the fall however this is likely unrelated to her dizziness and fall.   -Continue ASA 81 mg -Continue Lopressor 12.5 mg BID  HLD. Stable. Lipid panel from 2016 with LDL 77, HDL 81, VLDL 12 and total cholesterol 170. Not on a statin medication due to intolerance. On chart noted to be allergic.   HTN. At home on Coreg and Enalapril. BPs overnight slightly hypotensive with lowest 95/44.  -Holding enalapril for now, and consider discontinuing at discharge if patient remains hypo- to normotensive -Monitor blood pressures   HFmrEF/NICM s/p ICD 10/13/2004.  NICM secondary to [facility] chemo. She is followed by Dr. Sallyanne Kuster. Last echo 12/2015 with EF 40-45%.  She is at home on Lasix 20 mg daily. She does not have any signs of volume overload on exam to  suggest CHF exacerbation at this time however is exhibiting dyspnea on exertion. BNP 833.   -Echo pending  -I's and O's -daily weights -Monitor fluid status  Depression.  Stable.  At home on Prozac 10 mg daily.  -Continue home Prozac  FEN/GI: Heart diet, SLIV   Prophylaxis: Lovenox  Disposition: Home with HH (declines SNF)  Subjective:  Reports feeling well this AM and is ready to go home. No complaints.   Objective: Temp:  [97.3 F (36.3 C)-98.6 F (37 C)] 97.3 F (36.3 C) (06/02 0624) Pulse Rate:  [68-74] 71 (06/02 0624) Resp:  [16-20] 16 (06/02 0624) BP: (95-112)/(44-71) 109/71 (06/02 0624) SpO2:  [93 %-97 %] 97 % (06/02 0801) Weight:  [141 lb 15.6 oz (64.4 kg)] 141 lb 15.6 oz (64.4 kg) (06/02 0009) Physical Exam: General: sitting up in bed in NAD  Cardiovascular: RRR no murmurs appreciated Respiratory: faint crackles appreciated, no increased work of breathing on RA, no wheezing  Gastrointestinal: soft, NTND, +BS  MSK: no edema or tenderness noted  Derm: warm, dry Neuro: AOx4, no focal deficits Psych: appropriate mood and affect  Laboratory:  Recent Labs Lab 12/20/16 1131 12/21/16 0619 12/22/16 0908  WBC 9.8 8.9 9.2  HGB 13.3 12.0 12.8  HCT 39.7 36.7 39.7  PLT 139* 138* 163    Recent Labs Lab 12/20/16 1131 12/21/16 0619 12/22/16 0908  NA 136 136 138  K 3.3* 4.5 4.3  CL 102 103 105  CO2 24 25 26   BUN 19 20 21*  CREATININE 0.88 0.84 1.04*  CALCIUM  8.8* 8.8* 9.0  PROT 7.2  --   --   BILITOT 1.2  --   --   ALKPHOS 66  --   --   ALT 16  --   --   AST 29  --   --   GLUCOSE 114* 121* 95   Imaging/Diagnostic Tests: No results found.   Verner Mould, MD 12/23/2016, 10:23 AM PGY-2, [place] Intern pager: [phone], text pages welcome

## 2016-12-23 NOTE — Progress Notes (Signed)
  Echocardiogram 2D Echocardiogram has been performed.  Anilah Huck T Brooksie Ellwanger 12/23/2016, 10:40 AM

## 2016-12-24 DIAGNOSIS — I5032 Chronic diastolic (congestive) heart failure: Secondary | ICD-10-CM

## 2016-12-24 LAB — BASIC METABOLIC PANEL
Anion gap: 6 (ref 5–15)
BUN: 20 mg/dL (ref 6–20)
CHLORIDE: 104 mmol/L (ref 101–111)
CO2: 25 mmol/L (ref 22–32)
CREATININE: 0.82 mg/dL (ref 0.44–1.00)
Calcium: 8.6 mg/dL — ABNORMAL LOW (ref 8.9–10.3)
GFR calc Af Amer: >60 mL/min (ref >60)
GFR calc non Af Amer: >60 mL/min (ref >60)
GLUCOSE: 89 mg/dL (ref 65–99)
Potassium: 3.5 mmol/L (ref 3.5–5.1)
SODIUM: 135 mmol/L (ref 135–145)

## 2016-12-24 LAB — BRAIN NATRIURETIC PEPTIDE: B Natriuretic Peptide: 417.7 pg/mL — ABNORMAL HIGH (ref 0.0–100.0)

## 2016-12-24 MED ORDER — METOPROLOL TARTRATE 25 MG PO TABS: 12.5000 mg | ORAL_TABLET | Freq: Two times a day (BID) | ORAL | 0 refills | Status: DC

## 2016-12-24 MED ORDER — FUROSEMIDE 20 MG PO TABS
20.0000 mg | ORAL_TABLET | Freq: Every day | ORAL | Status: DC
  Administered 2016-12-24: 20 mg via ORAL
  Filled 2016-12-24: qty 1

## 2016-12-24 MED ORDER — POTASSIUM CHLORIDE CRYS ER 20 MEQ PO TBCR
40.0000 meq | EXTENDED_RELEASE_TABLET | Freq: Every day | ORAL | Status: DC
  Administered 2016-12-24: 40 meq via ORAL
  Filled 2016-12-24: qty 2

## 2016-12-24 MED ORDER — POTASSIUM CHLORIDE CRYS ER 20 MEQ PO TBCR
20.0000 meq | EXTENDED_RELEASE_TABLET | Freq: Every day | ORAL | 0 refills | Status: DC
Start: 2016-12-25 — End: 2017-01-20

## 2016-12-24 NOTE — Progress Notes (Signed)
Nsg Discharge Note  Admit Date:  12/20/2016 Discharge date: 12/24/2016   [name] to be D/C'd Home per MD order.  AVS completed.  Copy for chart, and copy for patient signed, and dated. Patient/caregiver able to verbalize understanding.  Discharge Medication: Allergies as of 12/24/2016      Reactions   Atorvastatin    Fatigue   Claritin [loratadine] Other (See Comments)   Fatigue   Crestor [rosuvastatin] Other (See Comments)   Myalgia      Medication List    STOP taking these medications   carvedilol 3.125 MG tablet Commonly known as:  COREG     TAKE these medications   ADVAIR DISKUS 100-50 MCG/DOSE Aepb Generic drug:  Fluticasone-Salmeterol INHALE 1 PUFF INTO THE LUNGS 2 (TWO) TIMES DAILY.   albuterol 108 (90 Base) MCG/ACT inhaler Commonly known as:  PROAIR HFA Inhale 2 puffs into the lungs every 6 (six) hours as needed for wheezing or shortness of breath.   aspirin EC 81 MG tablet Take 81 mg by mouth daily.   benzonatate 100 MG capsule Commonly known as:  TESSALON Take 1-2 capsules (100-200 mg total) by mouth 3 (three) times daily as needed for cough.   cetirizine 5 MG tablet Commonly known as:  ZYRTEC Take 1 tablet (5 mg total) by mouth daily. What changed:  when to take this  reasons to take this   enalapril 2.5 MG tablet Commonly known as:  VASOTEC TAKE 2 TABLETS BY MOUTH DAILY   feeding supplement (ENSURE COMPLETE) Liqd Take 237 mLs by mouth 3 (three) times daily between meals. What changed:  when to take this  reasons to take this  additional instructions   FLUoxetine 10 MG capsule Commonly known as:  PROZAC TAKE 1 CAPSULE (10 MG TOTAL) BY MOUTH DAILY.   furosemide 20 MG tablet Commonly known as:  LASIX Take 1 tablet (20 mg total) by mouth daily. KEEP APPT What changed:  additional instructions   meclizine 12.5 MG tablet Commonly known as:  ANTIVERT TAKE 1 TABLET (12.5 MG TOTAL) BY MOUTH 3 (THREE) TIMES DAILY.   metoprolol  tartrate 25 MG tablet Commonly known as:  LOPRESSOR Take 0.5 tablets (12.5 mg total) by mouth 2 (two) times daily.   potassium chloride SA 20 MEQ tablet Commonly known as:  K-DUR,KLOR-CON Take 1 tablet (20 mEq total) by mouth daily. Start taking on:  12/25/2016   predniSONE 20 MG tablet Commonly known as:  DELTASONE Take 3 PO QAM x2days, 2 PO QAM x2days, 1 PO QAM x3days       Discharge Assessment: Vitals:   12/23/16 2134 12/24/16 0550  BP: 100/84 113/72  Pulse: 73 74  Resp: 18 18  Temp: 97.9 F (36.6 C) 97.8 F (36.6 C)   Skin clean, dry and intact without evidence of skin break down, no evidence of skin tears noted. IV catheter discontinued intact. Site without signs and symptoms of complications - no redness or edema noted at insertion site, patient denies c/o pain - only slight tenderness at site.  Dressing with slight pressure applied.  D/c Instructions-Education: Discharge instructions given to patient/family with verbalized understanding. D/c education completed with patient/family including follow up instructions, medication list, d/c activities limitations if indicated, with other d/c instructions as indicated by MD - patient able to verbalize understanding, all questions fully answered. Patient instructed to return to ED, call 911, or call MD for any changes in condition.  Patient escorted via [place], and D/C home via private auto.  Salley Slaughter, RN 12/24/2016 1:45 PM

## 2016-12-24 NOTE — Progress Notes (Signed)
Progress Note  Patient Name: Brianna [name] Date of Encounter: 12/24/2016  Primary Cardiologist: Berry/Issak Goley  Subjective   No new syncope and no cardiovascular complaints. Walked around the unit without dyspnea or dizziness. ICD interrogation does not show a cause for syncope. There is normal device function. The ICD did record some relatively brief episodes of tachyarrhythmia that occurred one day earlier (12/19/2016 between 4 and 6 PM), asymptomatic. Review of the electrograms suggests she had brief paroxysmal atrial fibrillation that organized occasional to regular atrial flutter with 2:1 AV block and ventricular rate around 150. Each individual episode lasted for around 5 minutes. These episodes cannot explain her syncope. We may be underestimating the true burden of atrial fibrillation since she has a single chamber ventricular device. No history of TIA or CVA.  Inpatient Medications    Scheduled Meds: . aspirin EC  81 mg Oral Daily  . docusate sodium  100 mg Oral BID  . enoxaparin (LOVENOX) injection  40 mg Subcutaneous Q24H  . FLUoxetine  10 mg Oral Daily  . furosemide  20 mg Oral Daily  . metoprolol tartrate  12.5 mg Oral BID  . mometasone-formoterol  2 puff Inhalation BID  . potassium chloride  40 mEq Oral Daily  . sodium chloride flush  3 mL Intravenous Q12H  . tiotropium  18 mcg Inhalation Daily   Continuous Infusions:  PRN Meds: acetaminophen **OR** acetaminophen, albuterol, benzonatate, ondansetron **OR** ondansetron (ZOFRAN) IV   Vital Signs    Vitals:   12/23/16 2134 12/24/16 0550 12/24/16 0646 12/24/16 0854  BP: 100/84 113/72    Pulse: 73 74    Resp: 18 18    Temp: 97.9 F (36.6 C) 97.8 F (36.6 C)    TempSrc: Oral Oral    SpO2: 96% 97%  95%  Weight:   135 lb 3.2 oz (61.3 kg)   Height:        Intake/Output Summary (Last 24 hours) at 12/24/16 1133 Last data filed at 12/24/16 0802  Gross per 24 hour  Intake              240 ml  Output               800 ml  Net             -560 ml   Filed Weights   12/22/16 0359 12/23/16 0009 12/24/16 0646  Weight: 136 lb 7.4 oz (61.9 kg) 141 lb 15.6 oz (64.4 kg) 135 lb 3.2 oz (61.3 kg)    Telemetry    NSR, occ PACs, occ. PVCs - Personally Reviewed  ECG    NSR. LBBB, QRS 132 ms- Personally Reviewed  Physical Exam  Relaxed, comfortable GEN: No acute distress.   Neck: No JVD Cardiac: RRR, paradoxically split S2, no murmurs, rubs, or gallops. Healthy R subclavian ICD site Respiratory: Clear to auscultation bilaterally. GI: Soft, nontender, non-distended  MS: No edema; No deformity. Neuro:  Nonfocal  Psych: Normal affect   Labs    Chemistry Recent Labs Lab 12/20/16 1131 12/21/16 0619 12/22/16 0908 12/24/16 0443  NA 136 136 138 135  K 3.3* 4.5 4.3 3.5  CL 102 103 105 104  CO2 24 25 26 25   GLUCOSE 114* 121* 95 89  BUN 19 20 21* 20  CREATININE 0.88 0.84 1.04* 0.82  CALCIUM 8.8* 8.8* 9.0 8.6*  PROT 7.2  --   --   --   ALBUMIN 3.5  --   --   --  AST 29  --   --   --   ALT 16  --   --   --   ALKPHOS 66  --   --   --   BILITOT 1.2  --   --   --   GFRNONAA 59* >60 48* >60  GFRAA >60 >60 56* >60  ANIONGAP 10 8 7 6      Hematology Recent Labs Lab 12/20/16 1131 12/21/16 0619 12/22/16 0908  WBC 9.8 8.9 9.2  RBC 4.33 4.00 4.19  HGB 13.3 12.0 12.8  HCT 39.7 36.7 39.7  MCV 91.7 91.8 94.7  MCH 30.7 30.0 30.5  MCHC 33.5 32.7 32.2  RDW 14.1 14.2 14.9  PLT 139* 138* 163    Cardiac Enzymes Recent Labs Lab 12/20/16 1854 12/21/16 0057 12/21/16 0619  TROPONINI <0.03 <0.03 <0.03   No results for input(s): TROPIPOC in the last 168 hours.   BNP Recent Labs Lab 12/20/16 2053 12/24/16 0904  BNP 833.8* 417.7*     DDimer No results for input(s): DDIMER in the last 168 hours.   Radiology    No results found.  Cardiac Studies   - Left ventricle: The cavity size was mildly dilated. Wall thickness was increased in a pattern of mild LVH. Systolic function was  severely reduced. The estimated ejection fraction was in the range of 25% to 30%. Diffuse hypokinesis. The study is not technically sufficient to allow evaluation of LV diastolic function. - Aortic valve: Trileaflet; mildly thickened, mildly calcified leaflets. - Mitral valve: There was mild regurgitation. - Pulmonary arteries: Systolic pressure was mildly increased. PA peak pressure: 34 mm Hg (S). - Pericardium, extracardiac: A trivial pericardial effusion was identified posterior to the heart. There was no evidence of hemodynamic compromise.  Impressions:  - When compared to prior echocardiogram, EF is reduced.  Patient Profile     83 y.o. female with nonischemic CMP, resolved acute HF exacerbation, now with fall in EF (25-30%, previously 45%), presenting after fall/syncope, with newly diagnosed atrial fibrillation and brief atrial flutter. Has a [employer] single chamber ICD.  Assessment & Plan    1. Syncope:  initial impression was vertigo/fall, but on my interrogation sounds more like true syncope. ICD shows no arrhythmia at the time of her fall and should protect her from bradycardia. May have had hypotension (she has SBP of 100 today). 2. CHF: currently asymptomatic and clinically euvolemic. Unable to add RAAS inh. or increase beta blocker due to low BP (and now possible syncope). Reinforce sodium restriction and daily weight monitoring. She would benefit from upgrade to a BiV device (CRT-P at her age) and I would like to refer to EP as an outpatient. She is disinclined to undergo invasive procedures and used this opportunity to reaffirm her DNR status. 3. AFib:  As far as I can tell the episodes of PAF are brief. CHADSVasc is at least 81 (age 57, gender, CHF), but with her frailty and recent fall, I am reluctant to recommend anticoagulation. Will check an outpatient 30 day event monitor to clarify the frequency and duration of atrial fibrillation, before we commit to  anticoagulation. At this point she is opposed to it. 4. ICD: normal function of single-RV lead device function, does not require V pacing.   No additional inpatient workup planned. Will arrange 30 day event monitor, f/u at [facility] office with Dr. Gwenlyn Found or APP  To follow, EP consultation to discuss CRT.  Signed, Sanda Klein, MD  12/24/2016, 11:33 AM

## 2016-12-24 NOTE — Progress Notes (Signed)
Family Medicine Teaching Service Daily Progress Note Intern Pager: [phone]  Patient name: Brianna [name] Medical record number: [id number] Date of birth: 02-17-1930 Age: 83 y.o. Gender: female  Primary Care Provider: Donnamae Jude, MD Consultants: cardiology Code Status: DNR  Pt Overview and Major Events to Date:  Admitted 5/30  Assessment and Plan: Brianna [name] is a 83 y.o. female presenting with dizziness and mechanical fall.  PMH is significant for HLD, HTN, HFrEF, h/o breast ca, h/o CAD with pacemaker, depression, COPD, insomnia and allergies.     Fall 2/2 dizziness (vertigo) vs arrhythmia: Improved with Meclizine in ED. Positive orthostatic vitals. Pacemaker interrogated and indicated SVT night prior to fall.  -Fall precautions  -Vitals per unit routine  -cardiology following   DOE likely 2/2 CHF exacerbation. Resolved. Now s/p IV Lasix x2.   -Monitor respiratory status  -continue 20 mg lasix oral daily  COPD -stable  -Dulera 2 puffs BID -Albuterol nebulizer Q4 PRN  -Hold off steroid and antibiotics  -Added Spiriva   Hypokalemia. Persistent, K today 3.5 -continue to monitor, replete as necessary  -K-dur 40 meq daily ordered   H/o CAD with Pacemaker. Stable -Continue ASA 81 mg -Continue Lopressor 12.5 mg BID  HTN. Mildly hypotensive during this hospitalization. At home on Coreg and Enalapril. Changed Coreg to Lopressor due to COPD hx. 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  Worsened. NICM secondary to [facility] chemo. She is followed by Dr. Sallyanne Kuster. Last echo 12/2015 with EF 40-45%. BNP 833 on admission. Net output 1.6 L -cardiology following, appreciate recommendations -check BNP today -strict I's and O's -daily weights -Monitor fluid status -continue home lasix 20 mg daily  HLD. Stable. Lipid  panel from 2016 with LDL 77, HDL 81, VLDL 12 and total cholesterol 170. Not on a statin medication due to intolerance. On chart noted to be allergic.   Depression.  Stable.  At home on Prozac 10 mg daily.  -Continue home Prozac  FEN/GI: Heart diet, SLIV   Prophylaxis: Lovenox  Disposition: Home with HH (declines SNF), pending cardiology clearance  Subjective:  Doing well, declines SOB at rest but endorses DOE with walking short distances. Denies swelling, no CP. Hopes to go home.  Objective: Temp:  [97.4 F (36.3 C)-97.9 F (36.6 C)] 97.8 F (36.6 C) (06/03 0550) Pulse Rate:  [73-77] 74 (06/03 0550) Resp:  [18-20] 18 (06/03 0550) BP: (100-113)/(72-99) 113/72 (06/03 0550) SpO2:  [90 %-99 %] 95 % (06/03 0854) Weight:  [135 lb 3.2 oz (61.3 kg)] 135 lb 3.2 oz (61.3 kg) (06/03 0646) Physical Exam: General: elderly lady sitting up in bed in NAD  Cardiovascular: RRR no murmurs appreciated Respiratory: crackles ausculted bilaterally lung bases, no increased work of breathing on RA, no wheezing  MSK: no edema or tenderness noted  Neuro: AOx4, no focal deficits  Laboratory:  Recent Labs Lab 12/20/16 1131 12/21/16 0619 12/22/16 0908  WBC 9.8 8.9 9.2  HGB 13.3 12.0 12.8  HCT 39.7 36.7 39.7  PLT 139* 138* 163    Recent Labs Lab 12/20/16 1131 12/21/16 0619 12/22/16 0908 12/24/16 0443  NA 136 136 138 135  K 3.3* 4.5 4.3 3.5  CL 102 103 105 104  CO2 24 25 26 25   BUN 19 20 21* 20  CREATININE 0.88 0.84 1.04* 0.82  CALCIUM 8.8* 8.8* 9.0 8.6*  PROT 7.2  --   --   --  BILITOT 1.2  --   --   --   ALKPHOS 66  --   --   --   ALT 16  --   --   --   AST 29  --   --   --   GLUCOSE 114* 121* 95 89   Imaging/Diagnostic Tests:  Echo: Study Conclusions  - Left ventricle: The cavity size was mildly dilated. Wall   thickness was increased in a pattern of mild LVH. Systolic   function was severely reduced. The estimated ejection fraction   was in the range of 25% to 30%.  Diffuse hypokinesis. The study is   not technically sufficient to allow evaluation of LV diastolic   function. - Aortic valve: Trileaflet; mildly thickened, mildly calcified   leaflets. - Mitral valve: There was mild regurgitation. - Pulmonary arteries: Systolic pressure was mildly increased. PA   peak pressure: 34 mm Hg (S). - Pericardium, extracardiac: A trivial pericardial effusion was   identified posterior to the heart. There was no evidence of   hemodynamic compromise.  Impressions:  - When compared to prior echocardiogram, EF is reduced.  Steve Rattler, DO 12/24/2016, 9:47 AM PGY-1, [place] Intern pager: [phone], text pages welcome

## 2016-12-24 NOTE — Discharge Instructions (Signed)
°  Please weigh yourself consistently to make sure you are not retaining too much fluid.  Follow up with cardiology to get 30 day event monitor set up.

## 2016-12-24 NOTE — Progress Notes (Signed)
Progress Note  Patient Name: Brianna [name] Date of Encounter: 12/24/2016  Primary Cardiologist: Dr Gwenlyn Found Electrophysiologist: Dr Sallyanne Kuster  Subjective   Pt has not had any further syncope or near syncope. She says she has walked around the unit without problems.  Inpatient Medications    Scheduled Meds: . aspirin EC  81 mg Oral Daily  . docusate sodium  100 mg Oral BID  . enoxaparin (LOVENOX) injection  40 mg Subcutaneous Q24H  . FLUoxetine  10 mg Oral Daily  . metoprolol tartrate  12.5 mg Oral BID  . mometasone-formoterol  2 puff Inhalation BID  . sodium chloride flush  3 mL Intravenous Q12H  . tiotropium  18 mcg Inhalation Daily   Continuous Infusions:  PRN Meds: acetaminophen **OR** acetaminophen, albuterol, benzonatate, ondansetron **OR** ondansetron (ZOFRAN) IV   Vital Signs    Vitals:   12/23/16 1959 12/23/16 2134 12/24/16 0550 12/24/16 0646  BP:  100/84 113/72   Pulse:  73 74   Resp:  18 18   Temp:  97.9 F (36.6 C) 97.8 F (36.6 C)   TempSrc:  Oral Oral   SpO2: 94% 96% 97%   Weight:    135 lb 3.2 oz (61.3 kg)  Height:        Intake/Output Summary (Last 24 hours) at 12/24/16 0816 Last data filed at 12/24/16 0600  Gross per 24 hour  Intake              240 ml  Output              400 ml  Net             -160 ml   Filed Weights   12/22/16 0359 12/23/16 0009 12/24/16 0646  Weight: 136 lb 7.4 oz (61.9 kg) 141 lb 15.6 oz (64.4 kg) 135 lb 3.2 oz (61.3 kg)    Telemetry    NSR, PVCs - Personally Reviewed  ECG       Physical Exam   GEN: Frail, No acute distress.   Neck: No JVD Cardiac: RRR, no murmurs, rubs, or gallops.  Respiratory: diffuse rhonchi on Rt basilar crackles on Lt GI: Soft, nontender, non-distended  MS: No edema; No deformity. Neuro:  Nonfocal  Psych: Normal affect   Labs    Chemistry Recent Labs Lab 12/20/16 1131 12/21/16 0619 12/22/16 0908 12/24/16 0443  NA 136 136 138 135  K 3.3* 4.5 4.3 3.5  CL 102 103 105 104    CO2 24 25 26 25   GLUCOSE 114* 121* 95 89  BUN 19 20 21* 20  CREATININE 0.88 0.84 1.04* 0.82  CALCIUM 8.8* 8.8* 9.0 8.6*  PROT 7.2  --   --   --   ALBUMIN 3.5  --   --   --   AST 29  --   --   --   ALT 16  --   --   --   ALKPHOS 66  --   --   --   BILITOT 1.2  --   --   --   GFRNONAA 59* >60 48* >60  GFRAA >60 >60 56* >60  ANIONGAP 10 8 7 6      Hematology Recent Labs Lab 12/20/16 1131 12/21/16 0619 12/22/16 0908  WBC 9.8 8.9 9.2  RBC 4.33 4.00 4.19  HGB 13.3 12.0 12.8  HCT 39.7 36.7 39.7  MCV 91.7 91.8 94.7  MCH 30.7 30.0 30.5  MCHC 33.5 32.7 32.2  RDW 14.1 14.2  14.9  PLT 139* 138* 163    Cardiac Enzymes Recent Labs Lab 12/20/16 1854 12/21/16 0057 12/21/16 0619  TROPONINI <0.03 <0.03 <0.03   No results for input(s): TROPIPOC in the last 168 hours.   BNP Recent Labs Lab 12/20/16 2053  BNP 833.8*     DDimer No results for input(s): DDIMER in the last 168 hours.   Radiology    CXR 12/20/16 IMPRESSION: Stable fibrotic type change on the right. No edema or consolidation. Stable cardiac prominence. No change in pacemaker lead placement. There is aortic atherosclerosis. Bones osteoporotic. Bilateral carotid artery calcification noted.    Cardiac Studies   Echo 12/23/16 Study Conclusions  - Left ventricle: The cavity size was mildly dilated. Wall   thickness was increased in a pattern of mild LVH. Systolic   function was severely reduced. The estimated ejection fraction   was in the range of 25% to 30%. Diffuse hypokinesis. The study is   not technically sufficient to allow evaluation of LV diastolic   function. - Aortic valve: Trileaflet; mildly thickened, mildly calcified   leaflets. - Mitral valve: There was mild regurgitation. - Pulmonary arteries: Systolic pressure was mildly increased. PA   peak pressure: 34 mm Hg (S). - Pericardium, extracardiac: A trivial pericardial effusion was   identified posterior to the heart. There was no evidence  of   hemodynamic compromise.  Impressions:  - When compared to prior echocardiogram, EF is reduced.   Patient Profile     83 y.o. female with a hx of NICM who is being seen today for the evaluation of CHF and new drop in LVF, and syncope  Assessment & Plan    1. Syncope and collapse- No significant findings on ICD interogation  2. NICM- new drop in EF. She might benefit from a BiV upgrade at some point  3. Acute CHF- fairly stable on exam today after 1.7L diuresis  4. ? AF- Dr Sallyanne Kuster reviewed her EKG and telemetry and is not convinced she had AF  5. LBBB- chronic  6. MDT ICD in place  7. COPD- pulmonary fibrosis on Rt  Plan: MD to see. Check BNP today to document adequate diuresis. Consider changing Lopressor to Coreg with LVD. Would also consider Entresto but hesitant to do this now since we really don't know what caused her syncope-  ? Hypotensive episode.   Signed, Kerin Ransom, PA-C  12/24/2016, 8:16 AM

## 2016-12-25 DIAGNOSIS — I5032 Chronic diastolic (congestive) heart failure: Secondary | ICD-10-CM | POA: Diagnosis not present

## 2016-12-25 DIAGNOSIS — Z720 Tobacco use: Secondary | ICD-10-CM | POA: Diagnosis not present

## 2016-12-25 DIAGNOSIS — F329 Major depressive disorder, single episode, unspecified: Secondary | ICD-10-CM | POA: Diagnosis not present

## 2016-12-25 DIAGNOSIS — I110 Hypertensive heart disease with heart failure: Secondary | ICD-10-CM | POA: Diagnosis not present

## 2016-12-25 DIAGNOSIS — J441 Chronic obstructive pulmonary disease with (acute) exacerbation: Secondary | ICD-10-CM | POA: Diagnosis not present

## 2016-12-25 DIAGNOSIS — M810 Age-related osteoporosis without current pathological fracture: Secondary | ICD-10-CM | POA: Diagnosis not present

## 2016-12-26 DIAGNOSIS — F329 Major depressive disorder, single episode, unspecified: Secondary | ICD-10-CM | POA: Diagnosis not present

## 2016-12-26 DIAGNOSIS — M810 Age-related osteoporosis without current pathological fracture: Secondary | ICD-10-CM | POA: Diagnosis not present

## 2016-12-26 DIAGNOSIS — I110 Hypertensive heart disease with heart failure: Secondary | ICD-10-CM | POA: Diagnosis not present

## 2016-12-26 DIAGNOSIS — I5032 Chronic diastolic (congestive) heart failure: Secondary | ICD-10-CM | POA: Diagnosis not present

## 2016-12-26 DIAGNOSIS — Z720 Tobacco use: Secondary | ICD-10-CM | POA: Diagnosis not present

## 2016-12-26 DIAGNOSIS — J441 Chronic obstructive pulmonary disease with (acute) exacerbation: Secondary | ICD-10-CM | POA: Diagnosis not present

## 2016-12-27 ENCOUNTER — Other Ambulatory Visit: Payer: Self-pay | Admitting: Family Medicine

## 2016-12-27 ENCOUNTER — Encounter: Payer: Self-pay | Admitting: Family Medicine

## 2016-12-27 ENCOUNTER — Telehealth: Payer: Self-pay | Admitting: *Deleted

## 2016-12-27 ENCOUNTER — Ambulatory Visit (INDEPENDENT_AMBULATORY_CARE_PROVIDER_SITE_OTHER): Payer: Medicare Other | Admitting: Family Medicine

## 2016-12-27 VITALS — BP 120/60 | HR 71 | Temp 98.2°F | Ht 62.0 in | Wt 122.6 lb

## 2016-12-27 DIAGNOSIS — J439 Emphysema, unspecified: Secondary | ICD-10-CM | POA: Diagnosis not present

## 2016-12-27 DIAGNOSIS — R0902 Hypoxemia: Secondary | ICD-10-CM | POA: Diagnosis not present

## 2016-12-27 DIAGNOSIS — E876 Hypokalemia: Secondary | ICD-10-CM

## 2016-12-27 DIAGNOSIS — I5032 Chronic diastolic (congestive) heart failure: Secondary | ICD-10-CM

## 2016-12-27 NOTE — Assessment & Plan Note (Signed)
  O2 while ambulating 88%, 91% at rest. Patient symptomatic with ambulation. Hx of COPD and CHF contributing to hypoxia.  -continue daily lasix, continue COPD medications -order for home use O2 placed to use with exertion

## 2016-12-27 NOTE — Assessment & Plan Note (Signed)
  Admitted to hospital with low K which was repleted during stay. Taking 20 meq KCl daily and 20 mg Lasix daily  -check BMP today -will call patient with results -may need higher dose of KCl

## 2016-12-27 NOTE — Assessment & Plan Note (Signed)
   Appears stable since discharge. Weight unreliable (scale discrepancy)  -continue lasix 20mg  daily -advised patient to weigh herself daily and call cardiologist if weight increases by more than 5 pounds -follow up with cards as scheduled

## 2016-12-27 NOTE — Progress Notes (Signed)
    Subjective:    Patient ID: Brianna [name], female    DOB: 1930-03-26, 83 y.o.   MRN: [id number]   CC: hosp follow up   Doing well since discharge. Here with caretaker today, who comes to help her prom 9-3 M-F.   Home health PT is coming out tomorrow for the first visit and will follow with her 3x a week after that.  She is feeling weak since discharge in her legs. She complains of DOE:  CHF/COPD/Weight- weight today documented as 122 but from d/c weight 135- likely discrepancy in scale doubt patient has had 13 pound weight loss in the past 3 days. Endorses SOB on exertion. Sleeps laying flat. No cough, no sputum production. Is interested in home oxygen as her dyspnea walking around the house is significant. Denies chest pain.  Smoking status reviewed- current smoker   Objective:  BP 120/60 (BP Location: Right Arm, Cuff Size: Normal)   Pulse 71   Temp 98.2 F (36.8 C) (Oral)   Ht 5\' 2"  (1.575 m)   Wt 122 lb 9.6 oz (55.6 kg)   SpO2 91%   BMI 22.42 kg/m  Vitals and nursing note reviewed  General: frail elderly lady in NAD Cardiac: RRR, clear S1 and S2, no murmurs, rubs, or gallops Respiratory: crackles auscultated bilaterally, poor air movement throughout. No wheezes. No increased WOB. Extremities: no edema or cyanosis Neuro: alert and oriented, no focal deficits  Assessment & Plan:    Hypokalemia  Admitted to hospital with low K which was repleted during stay. Taking 20 meq KCl daily and 20 mg Lasix daily  -check BMP today -will call patient with results -may need higher dose of KCl  Hypoxia  O2 while ambulating 88%, 91% at rest. Patient symptomatic with ambulation. Hx of COPD and CHF contributing to hypoxia.  -continue daily lasix, continue COPD medications -order for home use O2 placed to use with exertion  Chronic diastolic heart failure   Appears stable since discharge. Weight unreliable (scale discrepancy)  -continue lasix 20mg  daily -advised  patient to weigh herself daily and call cardiologist if weight increases by more than 5 pounds -follow up with cards as scheduled    Return in about 4 weeks (around 01/24/2017).   Lucila Maine, DO Family Medicine Resident PGY-1

## 2016-12-27 NOTE — Telephone Encounter (Signed)
Signed order for oxygen 1-2 L/min along with OV notes and copy of patient's insurance cards faxed to [employer] at [phone]. Message left on VM for [employer] of [employer] [phone]) to expect this.  Hubbard Hartshorn, RN, BSN

## 2016-12-27 NOTE — Patient Instructions (Addendum)
  It was good seeing you today!  We'll check your potassium today and I'll call you with the results.  Please weigh yourself daily. If you gain more than 5 pounds please call your heart doctor as he may ask you to take more fluid pills.  You will hear from the home health company to get your home oxygen set up.  If you have questions or concerns please do not hesitate to call at [phone].\  Lucila Maine, DO PGY-1, [facility] 12/27/2016 3:10 PM

## 2016-12-28 ENCOUNTER — Telehealth: Payer: Self-pay | Admitting: *Deleted

## 2016-12-28 DIAGNOSIS — Z720 Tobacco use: Secondary | ICD-10-CM | POA: Diagnosis not present

## 2016-12-28 DIAGNOSIS — F329 Major depressive disorder, single episode, unspecified: Secondary | ICD-10-CM | POA: Diagnosis not present

## 2016-12-28 DIAGNOSIS — I110 Hypertensive heart disease with heart failure: Secondary | ICD-10-CM | POA: Diagnosis not present

## 2016-12-28 DIAGNOSIS — I5032 Chronic diastolic (congestive) heart failure: Secondary | ICD-10-CM | POA: Diagnosis not present

## 2016-12-28 DIAGNOSIS — M810 Age-related osteoporosis without current pathological fracture: Secondary | ICD-10-CM | POA: Diagnosis not present

## 2016-12-28 DIAGNOSIS — J441 Chronic obstructive pulmonary disease with (acute) exacerbation: Secondary | ICD-10-CM | POA: Diagnosis not present

## 2016-12-28 LAB — BASIC METABOLIC PANEL
BUN/Creatinine Ratio: 24 (ref 12–28)
BUN: 25 mg/dL (ref 8–27)
CALCIUM: 8.9 mg/dL (ref 8.7–10.3)
CO2: 23 mmol/L (ref 18–29)
Chloride: 97 mmol/L (ref 96–106)
Creatinine, Ser: 1.06 mg/dL — ABNORMAL HIGH (ref 0.57–1.00)
GFR calc Af Amer: 56 mL/min/{1.73_m2} — ABNORMAL LOW (ref >59)
GFR calc non Af Amer: 48 mL/min/{1.73_m2} — ABNORMAL LOW (ref >59)
GLUCOSE: 85 mg/dL (ref 65–99)
Potassium: 4.7 mmol/L (ref 3.5–5.2)
Sodium: 137 mmol/L (ref 134–144)

## 2016-12-28 NOTE — Telephone Encounter (Signed)
Will try to call patient again tomorrow.  There was no answer or voicemail available. Jazmin Hartsell,CMA

## 2016-12-28 NOTE — Telephone Encounter (Signed)
-----   Message from Valerie Roys, [place] sent at 12/28/2016  2:23 PM EDT ----- Please inform [name] her labwork was normal. No need to make any changes to medications, potassium was normal. Continue taking the same dose of potassium she is at home. Thank you

## 2016-12-29 NOTE — Telephone Encounter (Signed)
Yes a letter is fine, thank you

## 2016-12-29 NOTE — Telephone Encounter (Signed)
Received fax on 12/28/16 from [employer] requesting qualifying ambulating sat (room air sat and recovery sat of patient on O2). Current documentation only of RA ambulating and at rest without oxygen. Please advise. Hubbard Hartshorn, RN, BSN

## 2016-12-29 NOTE — Telephone Encounter (Signed)
Tried calling patient again but there was no answer or VM.  Is it fine to just mail her a letter?  Yenni Carra,CMA

## 2017-01-01 ENCOUNTER — Encounter: Payer: Self-pay | Admitting: *Deleted

## 2017-01-01 DIAGNOSIS — J441 Chronic obstructive pulmonary disease with (acute) exacerbation: Secondary | ICD-10-CM | POA: Diagnosis not present

## 2017-01-01 DIAGNOSIS — I110 Hypertensive heart disease with heart failure: Secondary | ICD-10-CM | POA: Diagnosis not present

## 2017-01-01 DIAGNOSIS — M810 Age-related osteoporosis without current pathological fracture: Secondary | ICD-10-CM | POA: Diagnosis not present

## 2017-01-01 DIAGNOSIS — I5032 Chronic diastolic (congestive) heart failure: Secondary | ICD-10-CM | POA: Diagnosis not present

## 2017-01-01 DIAGNOSIS — Z720 Tobacco use: Secondary | ICD-10-CM | POA: Diagnosis not present

## 2017-01-01 DIAGNOSIS — F329 Major depressive disorder, single episode, unspecified: Secondary | ICD-10-CM | POA: Diagnosis not present

## 2017-01-03 DIAGNOSIS — F329 Major depressive disorder, single episode, unspecified: Secondary | ICD-10-CM | POA: Diagnosis not present

## 2017-01-03 DIAGNOSIS — Z720 Tobacco use: Secondary | ICD-10-CM | POA: Diagnosis not present

## 2017-01-03 DIAGNOSIS — M810 Age-related osteoporosis without current pathological fracture: Secondary | ICD-10-CM | POA: Diagnosis not present

## 2017-01-03 DIAGNOSIS — J441 Chronic obstructive pulmonary disease with (acute) exacerbation: Secondary | ICD-10-CM | POA: Diagnosis not present

## 2017-01-03 DIAGNOSIS — I110 Hypertensive heart disease with heart failure: Secondary | ICD-10-CM | POA: Diagnosis not present

## 2017-01-03 DIAGNOSIS — I5032 Chronic diastolic (congestive) heart failure: Secondary | ICD-10-CM | POA: Diagnosis not present

## 2017-01-04 DIAGNOSIS — M810 Age-related osteoporosis without current pathological fracture: Secondary | ICD-10-CM | POA: Diagnosis not present

## 2017-01-04 DIAGNOSIS — I5032 Chronic diastolic (congestive) heart failure: Secondary | ICD-10-CM | POA: Diagnosis not present

## 2017-01-04 DIAGNOSIS — I110 Hypertensive heart disease with heart failure: Secondary | ICD-10-CM | POA: Diagnosis not present

## 2017-01-04 DIAGNOSIS — Z720 Tobacco use: Secondary | ICD-10-CM | POA: Diagnosis not present

## 2017-01-04 DIAGNOSIS — J441 Chronic obstructive pulmonary disease with (acute) exacerbation: Secondary | ICD-10-CM | POA: Diagnosis not present

## 2017-01-04 DIAGNOSIS — F329 Major depressive disorder, single episode, unspecified: Secondary | ICD-10-CM | POA: Diagnosis not present

## 2017-01-05 NOTE — Telephone Encounter (Signed)
Will forward to Elsie. Artemus Romanoff,CMA

## 2017-01-05 NOTE — Telephone Encounter (Signed)
Ok, no need to pursue oxygen at this time then. Thanks Lauren.

## 2017-01-05 NOTE — Telephone Encounter (Addendum)
Spoke with Dr. Vanetta Shawl re need to check patient's O2 sats while ambulating with and without oxygen. Called patient to discuss. Patient states she does not remember using oxygen in hospital or talking with Dr. Vanetta Shawl about using oxygen at home. Patient is not interested in obtaining oxygen at this time. Has f/u appt with PCP on 01/25/2017. Hubbard Hartshorn, RN, BSN

## 2017-01-08 DIAGNOSIS — I110 Hypertensive heart disease with heart failure: Secondary | ICD-10-CM | POA: Diagnosis not present

## 2017-01-08 DIAGNOSIS — Z720 Tobacco use: Secondary | ICD-10-CM | POA: Diagnosis not present

## 2017-01-08 DIAGNOSIS — J441 Chronic obstructive pulmonary disease with (acute) exacerbation: Secondary | ICD-10-CM | POA: Diagnosis not present

## 2017-01-08 DIAGNOSIS — I5032 Chronic diastolic (congestive) heart failure: Secondary | ICD-10-CM | POA: Diagnosis not present

## 2017-01-08 DIAGNOSIS — M810 Age-related osteoporosis without current pathological fracture: Secondary | ICD-10-CM | POA: Diagnosis not present

## 2017-01-08 DIAGNOSIS — F329 Major depressive disorder, single episode, unspecified: Secondary | ICD-10-CM | POA: Diagnosis not present

## 2017-01-09 ENCOUNTER — Encounter: Payer: Self-pay | Admitting: Cardiovascular Disease

## 2017-01-09 ENCOUNTER — Ambulatory Visit (INDEPENDENT_AMBULATORY_CARE_PROVIDER_SITE_OTHER): Payer: Medicare Other | Admitting: Cardiovascular Disease

## 2017-01-09 DIAGNOSIS — E7800 Pure hypercholesterolemia, unspecified: Secondary | ICD-10-CM

## 2017-01-09 DIAGNOSIS — I10 Essential (primary) hypertension: Secondary | ICD-10-CM | POA: Diagnosis not present

## 2017-01-09 NOTE — Assessment & Plan Note (Signed)
History of essential hypertension blood pressure measures 112/70. She is on enalapril and metoprolol. Continue current meds at current dose

## 2017-01-09 NOTE — Assessment & Plan Note (Signed)
History of hyperlipidemia not on statin therapy followed by her PCP 

## 2017-01-09 NOTE — Assessment & Plan Note (Addendum)
Brianna [name] had a recent episode of syncope requiring hospitalization. It is not clear whether she lost consciousness. She did see Dr. Sallyanne Kuster since that time were interrogated her ICD which did not reveal any arrhythmia that would cause this.

## 2017-01-09 NOTE — Progress Notes (Signed)
01/09/2017 Brianna [name]   1929/10/09  [id number]  Primary Physician Donnamae Jude, MD Primary Cardiologist: Lorretta Harp MD [name]  HPI:  The patient is a very pleasant 83 year old thin appearing widowed Caucasian female mother of 2, grandmother to 2 grandchildren who has a history of nonischemic cardiomyopathy related to chemotherapy for breast cancer 10 years ago. I last saw her in the office 01/12/69 in. She had an ICD placed for primary prevention by Dr. Elvis Coil in March of 2006 which has been followed by Dr. Sallyanne Kuster.Dr. Sallyanne Kuster last saw her in March and her ICD was functioning appropriately. Her most recent 2D echo performed in our office on February 27, 2012 revealed an EF of 45-50% with mild global hypokinesia and septal wall motion abnormality consistent with a conduction abnormality. She does complain of dyspnea on exertion but denies chest pain. Since I saw her a year ago she was admitted with syncope and shoulder fracture. She does live at home with daily home health care during the daytime hours. She was recently admitted once again with syncope 12/24/16. Subsequent evaluation in the office by Dr. Sallyanne Kuster to after interrogation of her ICD did not suggest an arrhythmogenic cause. 2-D echo revealed an EF of 25-30%.    Current Outpatient Prescriptions  Medication Sig Dispense Refill  . ADVAIR DISKUS 100-50 MCG/DOSE AEPB INHALE 1 PUFF INTO THE LUNGS 2 (TWO) TIMES DAILY. 60 each 1  . albuterol (PROAIR HFA) 108 (90 Base) MCG/ACT inhaler Inhale 2 puffs into the lungs every 6 (six) hours as needed for wheezing or shortness of breath. 8 g 2  . aspirin EC 81 MG tablet Take 81 mg by mouth daily.    . benzonatate (TESSALON) 100 MG capsule Take 1-2 capsules (100-200 mg total) by mouth 3 (three) times daily as needed for cough. 40 capsule 0  . cetirizine (ZYRTEC) 5 MG tablet Take 1 tablet (5 mg total) by mouth daily. (Patient taking differently: Take 5 mg by mouth  daily as needed (for seasonal allergies). ) 30 tablet 11  . enalapril (VASOTEC) 2.5 MG tablet TAKE 2 TABLETS BY MOUTH DAILY 180 tablet 2  . feeding supplement, ENSURE COMPLETE, (ENSURE COMPLETE) LIQD Take 237 mLs by mouth 3 (three) times daily between meals. (Patient taking differently: Take 237 mLs by mouth daily as needed. IF NO MEAL IS CONSUMED) 6 Bottle 1  . FLUoxetine (PROZAC) 10 MG capsule TAKE 1 CAPSULE (10 MG TOTAL) BY MOUTH DAILY. 30 capsule 2  . furosemide (LASIX) 20 MG tablet Take 1 tablet (20 mg total) by mouth daily. KEEP APPT (Patient taking differently: Take 20 mg by mouth daily. ) 90 tablet 0  . meclizine (ANTIVERT) 12.5 MG tablet TAKE 1 TABLET (12.5 MG TOTAL) BY MOUTH 3 (THREE) TIMES DAILY. 30 tablet 0  . metoprolol tartrate (LOPRESSOR) 25 MG tablet Take 0.5 tablets (12.5 mg total) by mouth 2 (two) times daily. 60 tablet 0  . potassium chloride SA (K-DUR,KLOR-CON) 20 MEQ tablet Take 1 tablet (20 mEq total) by mouth daily. 30 tablet 0  . predniSONE (DELTASONE) 20 MG tablet Take 3 PO QAM x2days, 2 PO QAM x2days, 1 PO QAM x3days 18 tablet 0   No current facility-administered medications for this visit.     Allergies  Allergen Reactions  . Atorvastatin     Fatigue   . Claritin [Loratadine] Other (See Comments)    Fatigue   . Crestor [Rosuvastatin] Other (See Comments)    Myalgia  Social History   Social History  . Marital status: Widowed    Spouse name: N/A  . Number of children: 2  . Years of education: 11   Occupational History  . Retired-Manager Writer. Retired   Social History Main Topics  . Smoking status: Light Tobacco Smoker    Packs/day: 0.10    Years: 44.00    Types: Cigarettes  . Smokeless tobacco: Never Used  . Alcohol use 3.0 oz/week    5 Glasses of wine per week  . Drug use: No  . Sexual activity: No   Other Topics Concern  . Not on file   Social History Narrative   Health Care POA:    Emergency Contact: friend, Trevor Iha,  [phone]   End of Life Plan:    Who lives with you: self, has medical alert button    Any pets: none   Diet: Patient has a varied diet and controls portions well.   Exercise: Patient does not have any regular exercise routine.   Seatbelts: Patient reports wearing seatbelt when in vehicle.   Brianna [name] Exposure/Protection: Patient reports wearing sun screening daily.   Hobbies: clothes alterations                 Review of Systems: General: negative for chills, fever, night sweats or weight changes.  Cardiovascular: negative for chest pain, dyspnea on exertion, edema, orthopnea, palpitations, paroxysmal nocturnal dyspnea or shortness of breath Dermatological: negative for rash Respiratory: negative for cough or wheezing Urologic: negative for hematuria Abdominal: negative for nausea, vomiting, diarrhea, bright red blood per rectum, melena, or hematemesis Neurologic: negative for visual changes, syncope, or dizziness All other systems reviewed and are otherwise negative except as noted above.    Blood pressure 112/70, pulse 75, height 5\' 2"  (1.575 m), weight 140 lb (63.5 kg).  General appearance: alert and no distress Neck: no adenopathy, no carotid bruit, no JVD, supple, symmetrical, trachea midline and thyroid not enlarged, symmetric, no tenderness/mass/nodules Lungs: clear to auscultation bilaterally Heart: regular rate and rhythm, S1, S2 normal, no murmur, click, rub or gallop Extremities: extremities normal, atraumatic, no cyanosis or edema  EKG sinus rhythm at 75 with left bundle branch block. I personally reviewed this EKG.  ASSESSMENT AND PLAN:   HYPERCHOLESTEROLEMIA History of hyperlipidemia not on statin therapy followed by her PCP  Essential hypertension History of essential hypertension blood pressure measures 112/70. She is on enalapril and metoprolol. Continue current meds at current dose  NICM (nonischemic cardiomyopathy) ([place]) History of nonischemic cardiomyopathy  status post ICD implantation by Dr. Mitzi Davenport March 2006 followed by Dr. Sallyanne Kuster since. Her EF is in the 25-30% range by recent 2-D echo.  Syncope [name] had a recent episode of syncope requiring hospitalization. It is not clear whether she lost consciousness. She did see Dr. Sallyanne Kuster since that time were interrogated her ICD which did not reveal any arrhythmia that would cause this.      Lorretta Harp MD FACP,FACC,FAHA, [facility] 01/09/2017 11:11 AM

## 2017-01-09 NOTE — Assessment & Plan Note (Signed)
History of nonischemic cardiomyopathy status post ICD implantation by Dr. Mitzi Davenport March 2006 followed by Dr. Sallyanne Kuster since. Her EF is in the 25-30% range by recent 2-D echo.

## 2017-01-09 NOTE — Patient Instructions (Signed)

## 2017-01-10 DIAGNOSIS — Z720 Tobacco use: Secondary | ICD-10-CM | POA: Diagnosis not present

## 2017-01-10 DIAGNOSIS — I110 Hypertensive heart disease with heart failure: Secondary | ICD-10-CM | POA: Diagnosis not present

## 2017-01-10 DIAGNOSIS — M810 Age-related osteoporosis without current pathological fracture: Secondary | ICD-10-CM | POA: Diagnosis not present

## 2017-01-10 DIAGNOSIS — F329 Major depressive disorder, single episode, unspecified: Secondary | ICD-10-CM | POA: Diagnosis not present

## 2017-01-10 DIAGNOSIS — I5032 Chronic diastolic (congestive) heart failure: Secondary | ICD-10-CM | POA: Diagnosis not present

## 2017-01-10 DIAGNOSIS — J441 Chronic obstructive pulmonary disease with (acute) exacerbation: Secondary | ICD-10-CM | POA: Diagnosis not present

## 2017-01-11 DIAGNOSIS — Z720 Tobacco use: Secondary | ICD-10-CM | POA: Diagnosis not present

## 2017-01-11 DIAGNOSIS — J441 Chronic obstructive pulmonary disease with (acute) exacerbation: Secondary | ICD-10-CM | POA: Diagnosis not present

## 2017-01-11 DIAGNOSIS — I5032 Chronic diastolic (congestive) heart failure: Secondary | ICD-10-CM | POA: Diagnosis not present

## 2017-01-11 DIAGNOSIS — I110 Hypertensive heart disease with heart failure: Secondary | ICD-10-CM | POA: Diagnosis not present

## 2017-01-11 DIAGNOSIS — F329 Major depressive disorder, single episode, unspecified: Secondary | ICD-10-CM | POA: Diagnosis not present

## 2017-01-11 DIAGNOSIS — M810 Age-related osteoporosis without current pathological fracture: Secondary | ICD-10-CM | POA: Diagnosis not present

## 2017-01-16 DIAGNOSIS — I110 Hypertensive heart disease with heart failure: Secondary | ICD-10-CM | POA: Diagnosis not present

## 2017-01-16 DIAGNOSIS — F329 Major depressive disorder, single episode, unspecified: Secondary | ICD-10-CM | POA: Diagnosis not present

## 2017-01-16 DIAGNOSIS — J441 Chronic obstructive pulmonary disease with (acute) exacerbation: Secondary | ICD-10-CM | POA: Diagnosis not present

## 2017-01-16 DIAGNOSIS — I5032 Chronic diastolic (congestive) heart failure: Secondary | ICD-10-CM | POA: Diagnosis not present

## 2017-01-16 DIAGNOSIS — M810 Age-related osteoporosis without current pathological fracture: Secondary | ICD-10-CM | POA: Diagnosis not present

## 2017-01-16 DIAGNOSIS — Z720 Tobacco use: Secondary | ICD-10-CM | POA: Diagnosis not present

## 2017-01-17 ENCOUNTER — Ambulatory Visit (INDEPENDENT_AMBULATORY_CARE_PROVIDER_SITE_OTHER): Payer: Medicare Other | Admitting: *Deleted

## 2017-01-17 ENCOUNTER — Telehealth: Payer: Self-pay | Admitting: Cardiology

## 2017-01-17 DIAGNOSIS — I428 Other cardiomyopathies: Secondary | ICD-10-CM | POA: Diagnosis not present

## 2017-01-17 NOTE — Telephone Encounter (Signed)
Spoke with pt and reminded pt of remote transmission that is due today. Pt verbalized understanding.   

## 2017-01-18 ENCOUNTER — Telehealth: Payer: Self-pay | Admitting: Cardiovascular Disease

## 2017-01-18 DIAGNOSIS — F329 Major depressive disorder, single episode, unspecified: Secondary | ICD-10-CM | POA: Diagnosis not present

## 2017-01-18 DIAGNOSIS — I5032 Chronic diastolic (congestive) heart failure: Secondary | ICD-10-CM | POA: Diagnosis not present

## 2017-01-18 DIAGNOSIS — Z720 Tobacco use: Secondary | ICD-10-CM | POA: Diagnosis not present

## 2017-01-18 DIAGNOSIS — M810 Age-related osteoporosis without current pathological fracture: Secondary | ICD-10-CM | POA: Diagnosis not present

## 2017-01-18 DIAGNOSIS — J441 Chronic obstructive pulmonary disease with (acute) exacerbation: Secondary | ICD-10-CM | POA: Diagnosis not present

## 2017-01-18 DIAGNOSIS — I110 Hypertensive heart disease with heart failure: Secondary | ICD-10-CM | POA: Diagnosis not present

## 2017-01-18 NOTE — Progress Notes (Signed)
Remote ICD transmission.   

## 2017-01-18 NOTE — Telephone Encounter (Signed)
SPOKE TO TRACY RN , [facility]  C/O patient shortness of breath today non- productive cough. B/p 96/60  ,wheezing,-- no edema ,no chest pain Weight same 139.2 lb today , last week 140 lb, patient does not weight daily.  Blood pressure last week ranged 120-130/70 per nurse.  Reviewed information to Dr Stanford Breed.  Per order,take extra lasix 20 mg today,and extra dose in the morning  continue to monitor. Order given to [place]   Nurse VOICED UNDERSTANDING, states she will see patient on Monday ,-- for weekly visit

## 2017-01-19 ENCOUNTER — Encounter: Payer: Self-pay | Admitting: Cardiology

## 2017-01-19 ENCOUNTER — Telehealth: Payer: Self-pay | Admitting: *Deleted

## 2017-01-19 NOTE — Telephone Encounter (Signed)
[facility] home health nurse called for patient. States that patient is doing well and her weights are stable. She is having some mild wheezing. BP is 98/60. She just wanted to report this to Dr. Kennon Rounds. It appears this patient has followup at [facility]. Will route note to Dr. Kennon Rounds.

## 2017-01-20 ENCOUNTER — Other Ambulatory Visit: Payer: Self-pay | Admitting: Family Medicine

## 2017-01-20 DIAGNOSIS — R42 Dizziness and giddiness: Secondary | ICD-10-CM | POA: Diagnosis not present

## 2017-01-20 DIAGNOSIS — R404 Transient alteration of awareness: Secondary | ICD-10-CM | POA: Diagnosis not present

## 2017-01-22 MED ORDER — METOPROLOL TARTRATE 25 MG PO TABS: 12.5000 mg | ORAL_TABLET | Freq: Two times a day (BID) | ORAL | 0 refills | Status: DC

## 2017-01-23 DIAGNOSIS — Z720 Tobacco use: Secondary | ICD-10-CM | POA: Diagnosis not present

## 2017-01-23 DIAGNOSIS — F329 Major depressive disorder, single episode, unspecified: Secondary | ICD-10-CM | POA: Diagnosis not present

## 2017-01-23 DIAGNOSIS — I5032 Chronic diastolic (congestive) heart failure: Secondary | ICD-10-CM | POA: Diagnosis not present

## 2017-01-23 DIAGNOSIS — M810 Age-related osteoporosis without current pathological fracture: Secondary | ICD-10-CM | POA: Diagnosis not present

## 2017-01-23 DIAGNOSIS — I110 Hypertensive heart disease with heart failure: Secondary | ICD-10-CM | POA: Diagnosis not present

## 2017-01-23 DIAGNOSIS — J441 Chronic obstructive pulmonary disease with (acute) exacerbation: Secondary | ICD-10-CM | POA: Diagnosis not present

## 2017-01-25 ENCOUNTER — Encounter: Payer: Self-pay | Admitting: Family Medicine

## 2017-01-25 ENCOUNTER — Ambulatory Visit (INDEPENDENT_AMBULATORY_CARE_PROVIDER_SITE_OTHER): Payer: Medicare Other | Admitting: Family Medicine

## 2017-01-25 VITALS — BP 108/62 | HR 73 | Temp 97.4°F | Wt 140.0 lb

## 2017-01-25 DIAGNOSIS — I5032 Chronic diastolic (congestive) heart failure: Secondary | ICD-10-CM

## 2017-01-25 DIAGNOSIS — E7800 Pure hypercholesterolemia, unspecified: Secondary | ICD-10-CM

## 2017-01-25 DIAGNOSIS — J439 Emphysema, unspecified: Secondary | ICD-10-CM

## 2017-01-25 DIAGNOSIS — I10 Essential (primary) hypertension: Secondary | ICD-10-CM | POA: Diagnosis present

## 2017-01-25 MED ORDER — ENALAPRIL MALEATE 2.5 MG PO TABS: 2.5000 mg | ORAL_TABLET | Freq: Every day | ORAL | 2 refills | Status: DC

## 2017-01-25 NOTE — Assessment & Plan Note (Signed)
Last check WNL--repeat labs next visit when fasting.

## 2017-01-25 NOTE — Assessment & Plan Note (Signed)
Continue Advair and Proair

## 2017-01-25 NOTE — Assessment & Plan Note (Signed)
Will decrease Vasotec to 1 2.5 mg tab daily--instructions printed, given to care-giver and told to share with patient's daughter

## 2017-01-25 NOTE — Assessment & Plan Note (Signed)
Daily weights--if weight is up, may need to take or increase her lasix

## 2017-01-25 NOTE — Progress Notes (Signed)
   Subjective:    Patient ID: Brianna [name] is a 83 y.o. female presenting with Shortness of Breath  on 01/25/2017  HPI: Here today for f/u. Has been seen since hospitalized for fall. She is here with her care-giver and she is using her walker consistently. She is on her inhalers and they are working well. She does have some shortness of breath but not a lot. She also reportedly had a BP of 90's/60s last week. Is on BP meds. Has h/o elevated cholesterol, but last check was WNL.  Review of Systems  Constitutional: Negative for chills and fever.  Respiratory: Positive for shortness of breath. Negative for wheezing.   Cardiovascular: Negative for chest pain.  Gastrointestinal: Negative for abdominal pain, nausea and vomiting.  Genitourinary: Negative for dysuria.  Skin: Negative for rash.      Objective:    BP 108/62   Pulse 73   Temp (!) 97.4 F (36.3 C) (Oral)   Wt 140 lb (63.5 kg)   SpO2 93%   BMI 25.61 kg/m  Physical Exam  Constitutional: She is oriented to person, place, and time. She appears well-developed and well-nourished. No distress.  HENT:  Head: Normocephalic and atraumatic.  Eyes: No scleral icterus.  Neck: Neck supple.  Cardiovascular: Normal rate.   Pulmonary/Chest: Effort normal. She has wheezes. She has no rales.  Abdominal: Soft.  Neurological: She is alert and oriented to person, place, and time.  Skin: Skin is warm and dry.  Psychiatric: She has a normal mood and affect.        Assessment & Plan:   Problem List Items Addressed This Visit      Unprioritized   HYPERCHOLESTEROLEMIA    Last check WNL--repeat labs next visit when fasting.      Relevant Medications   enalapril (VASOTEC) 2.5 MG tablet   Essential hypertension - Primary    Will decrease Vasotec to 1 2.5 mg tab daily--instructions printed, given to care-giver and told to share with patient's daughter      Relevant Medications   enalapril (VASOTEC) 2.5 MG tablet   Chronic  diastolic heart failure (HCC)    Daily weights--if weight is up, may need to take or increase her lasix      Relevant Medications   enalapril (VASOTEC) 2.5 MG tablet   COPD (chronic obstructive pulmonary disease) (HCC)    Continue Advair and Proair         Total face-to-face time with patient: 25 minutes. Over 50% of encounter was spent on counseling and coordination of care. Return in about 3 months (around 04/27/2017).  Brianna [name] 01/25/2017 5:44 PM

## 2017-01-25 NOTE — Patient Instructions (Addendum)
Begin taking Enalapril 2.5 mg daily  Chronic Obstructive Pulmonary Disease Chronic obstructive pulmonary disease (COPD) is a long-term (chronic) lung problem. When you have COPD, it is hard for air to get in and out of your lungs. The way your lungs work will never return to normal. Usually the condition gets worse over time. There are things you can do to keep yourself as healthy as possible. Your doctor may treat your condition with:  Medicines.  Quitting smoking, if you smoke.  Rehabilitation. This may involve a team of specialists.  Oxygen.  Exercise and changes to your diet.  Lung surgery.  Comfort measures (palliative care).  Follow these instructions at home: Medicines  Take over-the-counter and prescription medicines only as told by your doctor.  Talk to your doctor before taking any cough or allergy medicines. You may need to avoid medicines that cause your lungs to be dry. Lifestyle  If you smoke, stop. Smoking makes the problem worse. If you need help quitting, ask your doctor.  Avoid being around things that make your breathing worse. This may include smoke, chemicals, and fumes.  Stay active, but remember to also rest.  Learn and use tips on how to relax.  Make sure you get enough sleep. Most adults need at least 7 hours a night.  Eat healthy foods. Eat smaller meals more often. Rest before meals. Controlled breathing  Learn and use tips on how to control your breathing as told by your doctor. Try: ? Breathing in (inhaling) through your nose for 1 second. Then, pucker your lips and breath out (exhale) through your lips for 2 seconds. ? Putting one hand on your belly (abdomen). Breathe in slowly through your nose for 1 second. Your hand on your belly should move out. Pucker your lips and breathe out slowly through your lips. Your hand on your belly should move in as you breathe out. Controlled coughing  Learn and use controlled coughing to clear mucus from  your lungs. The steps are: 1. Lean your head a little forward. 2. Breathe in deeply. 3. Try to hold your breath for 3 seconds. 4. Keep your mouth slightly open while coughing 2 times. 5. Spit any mucus out into a tissue. 6. Rest and do the steps again 1 or 2 times as needed. General instructions  Make sure you get all the shots (vaccines) that your doctor recommends. Ask your doctor about a flu shot and a pneumonia shot.  Use oxygen therapy and therapy to help improve your lungs (pulmonary rehabilitation) if told by your doctor. If you need home oxygen therapy, ask your doctor if you should buy a tool to measure your oxygen level (oximeter).  Make a COPD action plan with your doctor. This helps you know what to do if you feel worse than usual.  Manage any other conditions you have as told by your doctor.  Avoid going outside when it is very hot, cold, or humid.  Avoid people who have a sickness you can catch (contagious).  Keep all follow-up visits as told by your doctor. This is important. Contact a doctor if:  You cough up more mucus than usual.  There is a change in the color or thickness of the mucus.  It is harder to breathe than usual.  Your breathing is faster than usual.  You have trouble sleeping.  You need to use your medicines more often than usual.  You have trouble doing your normal activities such as getting dressed or walking around  the house. Get help right away if:  You have shortness of breath while resting.  You have shortness of breath that stops you from: ? Being able to talk. ? Doing normal activities.  Your chest hurts for longer than 5 minutes.  Your skin color is more blue than usual.  Your pulse oximeter shows that you have low oxygen for longer than 5 minutes.  You have a fever.  You feel too tired to breathe normally. Summary  Chronic obstructive pulmonary disease (COPD) is a long-term lung problem.  The way your lungs work will  never return to normal. Usually the condition gets worse over time. There are things you can do to keep yourself as healthy as possible.  Take over-the-counter and prescription medicines only as told by your doctor.  If you smoke, stop. Smoking makes the problem worse. This information is not intended to replace advice given to you by your health care provider. Make sure you discuss any questions you have with your health care provider. Document Released: 12/27/2007 Document Revised: 12/16/2015 Document Reviewed: 03/06/2013 Elsevier Interactive Patient Education  2017 [employer].

## 2017-02-04 ENCOUNTER — Other Ambulatory Visit: Payer: Self-pay | Admitting: Cardiovascular Disease

## 2017-02-06 DIAGNOSIS — I5032 Chronic diastolic (congestive) heart failure: Secondary | ICD-10-CM | POA: Diagnosis not present

## 2017-02-06 DIAGNOSIS — J441 Chronic obstructive pulmonary disease with (acute) exacerbation: Secondary | ICD-10-CM | POA: Diagnosis not present

## 2017-02-06 DIAGNOSIS — F329 Major depressive disorder, single episode, unspecified: Secondary | ICD-10-CM | POA: Diagnosis not present

## 2017-02-06 DIAGNOSIS — I110 Hypertensive heart disease with heart failure: Secondary | ICD-10-CM | POA: Diagnosis not present

## 2017-02-06 DIAGNOSIS — M810 Age-related osteoporosis without current pathological fracture: Secondary | ICD-10-CM | POA: Diagnosis not present

## 2017-02-06 DIAGNOSIS — Z720 Tobacco use: Secondary | ICD-10-CM | POA: Diagnosis not present

## 2017-02-07 ENCOUNTER — Telehealth: Payer: Self-pay | Admitting: Family Medicine

## 2017-02-07 NOTE — Telephone Encounter (Signed)
Will forward to MD to place this DME order and then I can print it here.  Makina Skow,CMA

## 2017-02-07 NOTE — Telephone Encounter (Signed)
Marlowe Kays from [facility] called and would like to have the doctor put in orders for the patient have a rollator. jw

## 2017-02-08 ENCOUNTER — Emergency Department (HOSPITAL_COMMUNITY): Payer: Medicare Other

## 2017-02-08 ENCOUNTER — Observation Stay (HOSPITAL_COMMUNITY)
Admission: EM | Admit: 2017-02-08 | Discharge: 2017-02-09 | Disposition: A | Discharge status: Home / Self Care | Payer: Medicare Other | Attending: Internal Medicine | Admitting: Internal Medicine

## 2017-02-08 ENCOUNTER — Observation Stay (HOSPITAL_COMMUNITY): Payer: Medicare Other

## 2017-02-08 ENCOUNTER — Encounter (HOSPITAL_COMMUNITY): Payer: Self-pay | Admitting: Emergency Medicine

## 2017-02-08 DIAGNOSIS — Z6823 Body mass index (BMI) 23.0-23.9, adult: Secondary | ICD-10-CM | POA: Insufficient documentation

## 2017-02-08 DIAGNOSIS — Z7951 Long term (current) use of inhaled steroids: Secondary | ICD-10-CM | POA: Diagnosis not present

## 2017-02-08 DIAGNOSIS — M810 Age-related osteoporosis without current pathological fracture: Secondary | ICD-10-CM | POA: Diagnosis present

## 2017-02-08 DIAGNOSIS — R0902 Hypoxemia: Secondary | ICD-10-CM

## 2017-02-08 DIAGNOSIS — J439 Emphysema, unspecified: Secondary | ICD-10-CM | POA: Insufficient documentation

## 2017-02-08 DIAGNOSIS — I10 Essential (primary) hypertension: Secondary | ICD-10-CM | POA: Diagnosis not present

## 2017-02-08 DIAGNOSIS — W19XXXA Unspecified fall, initial encounter: Secondary | ICD-10-CM | POA: Insufficient documentation

## 2017-02-08 DIAGNOSIS — Z9221 Personal history of antineoplastic chemotherapy: Secondary | ICD-10-CM | POA: Diagnosis not present

## 2017-02-08 DIAGNOSIS — J45909 Unspecified asthma, uncomplicated: Secondary | ICD-10-CM | POA: Diagnosis present

## 2017-02-08 DIAGNOSIS — I5032 Chronic diastolic (congestive) heart failure: Secondary | ICD-10-CM | POA: Diagnosis present

## 2017-02-08 DIAGNOSIS — S728X1A Other fracture of right femur, initial encounter for closed fracture: Secondary | ICD-10-CM | POA: Diagnosis not present

## 2017-02-08 DIAGNOSIS — Z853 Personal history of malignant neoplasm of breast: Secondary | ICD-10-CM | POA: Insufficient documentation

## 2017-02-08 DIAGNOSIS — I5022 Chronic systolic (congestive) heart failure: Secondary | ICD-10-CM | POA: Diagnosis not present

## 2017-02-08 DIAGNOSIS — Z95810 Presence of automatic (implantable) cardiac defibrillator: Secondary | ICD-10-CM | POA: Diagnosis present

## 2017-02-08 DIAGNOSIS — R0600 Dyspnea, unspecified: Secondary | ICD-10-CM | POA: Diagnosis not present

## 2017-02-08 DIAGNOSIS — Z79899 Other long term (current) drug therapy: Secondary | ICD-10-CM | POA: Insufficient documentation

## 2017-02-08 DIAGNOSIS — I429 Cardiomyopathy, unspecified: Secondary | ICD-10-CM | POA: Insufficient documentation

## 2017-02-08 DIAGNOSIS — R51 Headache: Secondary | ICD-10-CM | POA: Diagnosis not present

## 2017-02-08 DIAGNOSIS — I700 Atherosclerosis of aorta: Secondary | ICD-10-CM | POA: Insufficient documentation

## 2017-02-08 DIAGNOSIS — I447 Left bundle-branch block, unspecified: Secondary | ICD-10-CM | POA: Diagnosis not present

## 2017-02-08 DIAGNOSIS — T148XXA Other injury of unspecified body region, initial encounter: Secondary | ICD-10-CM | POA: Diagnosis not present

## 2017-02-08 DIAGNOSIS — S3992XA Unspecified injury of lower back, initial encounter: Secondary | ICD-10-CM | POA: Diagnosis not present

## 2017-02-08 DIAGNOSIS — E785 Hyperlipidemia, unspecified: Secondary | ICD-10-CM | POA: Diagnosis not present

## 2017-02-08 DIAGNOSIS — I428 Other cardiomyopathies: Secondary | ICD-10-CM | POA: Diagnosis not present

## 2017-02-08 DIAGNOSIS — R0602 Shortness of breath: Secondary | ICD-10-CM | POA: Diagnosis not present

## 2017-02-08 DIAGNOSIS — M545 Low back pain: Secondary | ICD-10-CM | POA: Diagnosis not present

## 2017-02-08 DIAGNOSIS — S3993XA Unspecified injury of pelvis, initial encounter: Secondary | ICD-10-CM | POA: Diagnosis not present

## 2017-02-08 DIAGNOSIS — I5042 Chronic combined systolic (congestive) and diastolic (congestive) heart failure: Secondary | ICD-10-CM | POA: Diagnosis not present

## 2017-02-08 DIAGNOSIS — M25551 Pain in right hip: Secondary | ICD-10-CM | POA: Diagnosis not present

## 2017-02-08 DIAGNOSIS — E46 Unspecified protein-calorie malnutrition: Secondary | ICD-10-CM | POA: Insufficient documentation

## 2017-02-08 DIAGNOSIS — I472 Ventricular tachycardia: Secondary | ICD-10-CM | POA: Insufficient documentation

## 2017-02-08 DIAGNOSIS — E7800 Pure hypercholesterolemia, unspecified: Secondary | ICD-10-CM | POA: Diagnosis present

## 2017-02-08 DIAGNOSIS — J449 Chronic obstructive pulmonary disease, unspecified: Secondary | ICD-10-CM | POA: Diagnosis present

## 2017-02-08 DIAGNOSIS — Z66 Do not resuscitate: Secondary | ICD-10-CM | POA: Diagnosis not present

## 2017-02-08 DIAGNOSIS — F17210 Nicotine dependence, cigarettes, uncomplicated: Secondary | ICD-10-CM | POA: Diagnosis not present

## 2017-02-08 DIAGNOSIS — M25559 Pain in unspecified hip: Secondary | ICD-10-CM | POA: Diagnosis present

## 2017-02-08 DIAGNOSIS — J9601 Acute respiratory failure with hypoxia: Secondary | ICD-10-CM | POA: Insufficient documentation

## 2017-02-08 DIAGNOSIS — I480 Paroxysmal atrial fibrillation: Secondary | ICD-10-CM | POA: Insufficient documentation

## 2017-02-08 DIAGNOSIS — R55 Syncope and collapse: Secondary | ICD-10-CM | POA: Diagnosis not present

## 2017-02-08 DIAGNOSIS — Z7982 Long term (current) use of aspirin: Secondary | ICD-10-CM | POA: Insufficient documentation

## 2017-02-08 HISTORY — DX: Unspecified systolic (congestive) heart failure: I50.20

## 2017-02-08 HISTORY — DX: Paroxysmal atrial fibrillation: I48.0

## 2017-02-08 LAB — CBC
HEMATOCRIT: 38.8 % (ref 36.0–46.0)
HEMOGLOBIN: 13 g/dL (ref 12.0–15.0)
MCH: 31 pg (ref 26.0–34.0)
MCHC: 33.5 g/dL (ref 30.0–36.0)
MCV: 92.6 fL (ref 78.0–100.0)
Platelets: 155 10*3/uL (ref 150–400)
RBC: 4.19 MIL/uL (ref 3.87–5.11)
RDW: 13.8 % (ref 11.5–15.5)
WBC: 9.2 10*3/uL (ref 4.0–10.5)

## 2017-02-08 LAB — CBC WITH DIFFERENTIAL/PLATELET
BASOS ABS: 0 10*3/uL (ref 0.0–0.1)
BASOS PCT: 0 %
Eosinophils Absolute: 0 10*3/uL (ref 0.0–0.7)
Eosinophils Relative: 1 %
HCT: 38.4 % (ref 36.0–46.0)
HEMOGLOBIN: 12.8 g/dL (ref 12.0–15.0)
Lymphocytes Relative: 11 %
Lymphs Abs: 1 10*3/uL (ref 0.7–4.0)
MCH: 30.6 pg (ref 26.0–34.0)
MCHC: 33.3 g/dL (ref 30.0–36.0)
MCV: 91.9 fL (ref 78.0–100.0)
MONO ABS: 0.4 10*3/uL (ref 0.1–1.0)
Monocytes Relative: 5 %
NEUTROS ABS: 7.2 10*3/uL (ref 1.7–7.7)
NEUTROS PCT: 83 %
PLATELETS: 167 10*3/uL (ref 150–400)
RBC: 4.18 MIL/uL (ref 3.87–5.11)
RDW: 13.6 % (ref 11.5–15.5)
WBC: 8.6 10*3/uL (ref 4.0–10.5)

## 2017-02-08 LAB — URINALYSIS, ROUTINE W REFLEX MICROSCOPIC
Bilirubin Urine: NEGATIVE
GLUCOSE, UA: NEGATIVE mg/dL
Hgb urine dipstick: NEGATIVE
KETONES UR: NEGATIVE mg/dL
LEUKOCYTES UA: NEGATIVE
NITRITE: NEGATIVE
PH: 6 (ref 5.0–8.0)
PROTEIN: NEGATIVE mg/dL
Specific Gravity, Urine: 1.014 (ref 1.005–1.030)

## 2017-02-08 LAB — CREATININE, SERUM
Creatinine, Ser: 0.91 mg/dL (ref 0.44–1.00)
GFR, EST NON AFRICAN AMERICAN: 56 mL/min — AB (ref >60)

## 2017-02-08 LAB — TROPONIN I
Troponin I: <0.03 ng/mL (ref <0.03)
Troponin I: <0.03 ng/mL (ref <0.03)

## 2017-02-08 LAB — COMPREHENSIVE METABOLIC PANEL
ALK PHOS: 73 U/L (ref 38–126)
ALT: 18 U/L (ref 14–54)
ANION GAP: 11 (ref 5–15)
AST: 32 U/L (ref 15–41)
Albumin: 4.1 g/dL (ref 3.5–5.0)
BUN: 22 mg/dL — ABNORMAL HIGH (ref 6–20)
CALCIUM: 8.9 mg/dL (ref 8.9–10.3)
CO2: 26 mmol/L (ref 22–32)
CREATININE: 0.83 mg/dL (ref 0.44–1.00)
Chloride: 101 mmol/L (ref 101–111)
Glucose, Bld: 102 mg/dL — ABNORMAL HIGH (ref 65–99)
Potassium: 4.3 mmol/L (ref 3.5–5.1)
SODIUM: 138 mmol/L (ref 135–145)
Total Bilirubin: 0.7 mg/dL (ref 0.3–1.2)
Total Protein: 7.8 g/dL (ref 6.5–8.1)

## 2017-02-08 LAB — BRAIN NATRIURETIC PEPTIDE: B NATRIURETIC PEPTIDE 5: 424.3 pg/mL — AB (ref 0.0–100.0)

## 2017-02-08 LAB — CK TOTAL AND CKMB (NOT AT ARMC)
CK, MB: 3.3 ng/mL (ref 0.5–5.0)
RELATIVE INDEX: 1.3 (ref 0.0–2.5)
Total CK: 264 U/L — ABNORMAL HIGH (ref 38–234)

## 2017-02-08 MED ORDER — HYDROCODONE-ACETAMINOPHEN 5-325 MG PO TABS
1.0000 | ORAL_TABLET | Freq: Once | ORAL | Status: AC
  Administered 2017-02-08: 1 via ORAL
  Filled 2017-02-08: qty 1

## 2017-02-08 MED ORDER — CALCIUM CARBONATE-VITAMIN D 600-400 MG-UNIT PO TABS: 1.0000 | ORAL_TABLET | Freq: Every day | ORAL | Status: DC

## 2017-02-08 MED ORDER — ENOXAPARIN SODIUM 40 MG/0.4ML ~~LOC~~ SOLN
40.0000 mg | SUBCUTANEOUS | Status: DC
  Administered 2017-02-08: 40 mg via SUBCUTANEOUS
  Filled 2017-02-08: qty 0.4

## 2017-02-08 MED ORDER — SODIUM CHLORIDE 0.9 % IV BOLUS (SEPSIS)
1000.0000 mL | Freq: Once | INTRAVENOUS | Status: AC
  Administered 2017-02-08: 1000 mL via INTRAVENOUS

## 2017-02-08 MED ORDER — ACETAMINOPHEN 650 MG RE SUPP: 650.0000 mg | Freq: Four times a day (QID) | RECTAL | Status: DC | PRN

## 2017-02-08 MED ORDER — ACETAMINOPHEN 325 MG PO TABS: 650.0000 mg | ORAL_TABLET | Freq: Four times a day (QID) | ORAL | Status: DC | PRN

## 2017-02-08 MED ORDER — ONDANSETRON HCL 4 MG PO TABS: 4.0000 mg | ORAL_TABLET | Freq: Four times a day (QID) | ORAL | Status: DC | PRN

## 2017-02-08 MED ORDER — SODIUM CHLORIDE 0.9 % IV SOLN
INTRAVENOUS | Status: AC
  Administered 2017-02-08: 20:00:00 via INTRAVENOUS

## 2017-02-08 MED ORDER — ONDANSETRON HCL 4 MG/2ML IJ SOLN: 4.0000 mg | Freq: Four times a day (QID) | INTRAMUSCULAR | Status: DC | PRN

## 2017-02-08 MED ORDER — IPRATROPIUM-ALBUTEROL 0.5-2.5 (3) MG/3ML IN SOLN
3.0000 mL | RESPIRATORY_TRACT | Status: DC
  Administered 2017-02-08 (×2): 3 mL via RESPIRATORY_TRACT
  Filled 2017-02-08 (×2): qty 3

## 2017-02-08 MED ORDER — ADULT MULTIVITAMIN W/MINERALS CH
1.0000 | ORAL_TABLET | Freq: Every day | ORAL | Status: DC
  Administered 2017-02-09: 1 via ORAL
  Filled 2017-02-08 (×2): qty 1

## 2017-02-08 MED ORDER — POLYETHYLENE GLYCOL 3350 17 G PO PACK: 17.0000 g | PACK | Freq: Every day | ORAL | Status: DC | PRN

## 2017-02-08 MED ORDER — IPRATROPIUM-ALBUTEROL 0.5-2.5 (3) MG/3ML IN SOLN
3.0000 mL | Freq: Three times a day (TID) | RESPIRATORY_TRACT | Status: DC
  Administered 2017-02-09 (×2): 3 mL via RESPIRATORY_TRACT
  Filled 2017-02-08 (×2): qty 3

## 2017-02-08 MED ORDER — IOPAMIDOL (ISOVUE-370) INJECTION 76%
INTRAVENOUS | Status: AC
  Filled 2017-02-08: qty 100

## 2017-02-08 MED ORDER — FENTANYL CITRATE (PF) 100 MCG/2ML IJ SOLN
50.0000 ug | Freq: Once | INTRAMUSCULAR | Status: AC
  Administered 2017-02-08: 50 ug via INTRAVENOUS
  Filled 2017-02-08: qty 2

## 2017-02-08 MED ORDER — IOPAMIDOL (ISOVUE-370) INJECTION 76%
100.0000 mL | Freq: Once | INTRAVENOUS | Status: AC | PRN
  Administered 2017-02-08: 100 mL via INTRAVENOUS

## 2017-02-08 MED ORDER — ASPIRIN 81 MG PO CHEW
81.0000 mg | CHEWABLE_TABLET | Freq: Every day | ORAL | Status: DC
  Administered 2017-02-09: 81 mg via ORAL
  Filled 2017-02-08 (×2): qty 1

## 2017-02-08 MED ORDER — CALCIUM CARBONATE-VITAMIN D 500-200 MG-UNIT PO TABS
1.0000 | ORAL_TABLET | Freq: Every day | ORAL | Status: DC
  Administered 2017-02-09: 1 via ORAL
  Filled 2017-02-08 (×2): qty 1

## 2017-02-08 MED ORDER — LACTATED RINGERS IV BOLUS (SEPSIS)
500.0000 mL | Freq: Once | INTRAVENOUS | Status: AC
  Administered 2017-02-08: 75 mL via INTRAVENOUS

## 2017-02-08 MED ORDER — FUROSEMIDE 10 MG/ML IJ SOLN
40.0000 mg | Freq: Once | INTRAMUSCULAR | Status: AC
  Administered 2017-02-08: 40 mg via INTRAVENOUS
  Filled 2017-02-08: qty 4

## 2017-02-08 MED ORDER — BISACODYL 10 MG RE SUPP: 10.0000 mg | Freq: Every day | RECTAL | Status: DC | PRN

## 2017-02-08 MED ORDER — ENALAPRIL MALEATE 2.5 MG PO TABS
2.5000 mg | ORAL_TABLET | Freq: Every day | ORAL | Status: DC
  Administered 2017-02-08: 2.5 mg via ORAL
  Filled 2017-02-08 (×2): qty 1

## 2017-02-08 MED ORDER — PREDNISONE 20 MG PO TABS
60.0000 mg | ORAL_TABLET | Freq: Once | ORAL | Status: AC
  Administered 2017-02-08: 60 mg via ORAL
  Filled 2017-02-08: qty 3

## 2017-02-08 MED ORDER — ALUM & MAG HYDROXIDE-SIMETH 200-200-20 MG/5ML PO SUSP: 30.0000 mL | Freq: Four times a day (QID) | ORAL | Status: DC | PRN

## 2017-02-08 MED ORDER — SODIUM CHLORIDE 0.9% FLUSH
3.0000 mL | Freq: Two times a day (BID) | INTRAVENOUS | Status: DC
  Administered 2017-02-08 – 2017-02-09 (×2): 3 mL via INTRAVENOUS

## 2017-02-08 MED ORDER — METOPROLOL TARTRATE 25 MG PO TABS
12.5000 mg | ORAL_TABLET | Freq: Two times a day (BID) | ORAL | Status: DC
  Administered 2017-02-08 – 2017-02-09 (×2): 12.5 mg via ORAL
  Filled 2017-02-08 (×2): qty 1

## 2017-02-08 NOTE — ED Provider Notes (Signed)
[facility] DEPT Provider Note   CSN: [id number] Arrival date & time: 02/08/17  7829     History   Chief Complaint Chief Complaint  Patient presents with  . Hip Pain    HPI PORCHE STEINBERGER is a 83 y.o. female.   Loss of Consciousness   This is a new problem. The current episode started 6 to 12 hours ago. The problem occurs constantly. The problem has been resolved. Length of episode of loss of consciousness: unclear.    Past Medical History:  Diagnosis Date  . AICD (automatic cardioverter/defibrillator) present   . Anxiety   . Arthritis    "lower back; knees" (12/20/2016)  . Cancer of left breast ([place])   . Depression   . Emphysema of lung ([place])   . Falls frequently    12/20/2016 "couple falls since 07/2016"  . Heart murmur   . Hyperlipidemia   . Hypertension   . Nonischemic cardiomyopathy ([place])   . Osteopenia   . Pneumonia ~ 5621-3086 X 3  . Presence of permanent cardiac pacemaker     Patient Active Problem List   Diagnosis Date Noted  . Syncope and collapse 02/08/2017  . Hypokalemia 12/27/2016  . LBBB (left bundle branch block) 12/23/2016  . Atrial fibrillation, new onset ([place]) 12/23/2016  . COPD exacerbation ([place])   . Acute on chronic systolic congestive heart failure ([place])   . Hypoxia 12/20/2016  . Fracture of right humerus 12/29/2015  . Syncope 12/28/2015  . Acute blood loss anemia 06/17/2014  . COPD (chronic obstructive pulmonary disease) ([place]) 06/10/2014  . Asthma, chronic 05/19/2014  . Constipation 05/19/2014  . Major depression, chronic 05/19/2014  . Protein-calorie malnutrition, severe ([place]) 05/12/2014  . Hip fracture, intertrochanteric ([place]) 05/10/2014  . NICM (nonischemic cardiomyopathy) ([place]) 03/24/2014  . Dyspnea 08/03/2013  . Automatic implantable cardioverter-defibrillator in situ 04/16/2013  . CYSTOCELE WITHOUT MENTION UTERINE PROLAPSE LAT 10/12/2008  . History of breast cancer 11/05/2007  . INSOMNIA-SLEEP DISORDER-UNSPEC 07/10/2007    . HYPERCHOLESTEROLEMIA 09/20/2006  . Essential hypertension 09/20/2006  . VENTRICULAR TACHYCARDIA 09/20/2006  . Chronic diastolic heart failure ([place]) 09/20/2006  . PSORIASIS 09/20/2006  . Osteoporosis 09/20/2006    Past Surgical History:  Procedure Laterality Date  . APPENDECTOMY  1943  . BREAST BIOPSY Left  [id number]  . CARDIAC CATHETERIZATION  12/18/2002   Normal coronary arteries and normal LV function  . CARDIAC DEFIBRILLATOR PLACEMENT  10/13/2004   Medtronic Maximo VR, model [id number], serial [id number]  . CATARACT EXTRACTION W/ INTRAOCULAR LENS  IMPLANT, BILATERAL    . FEMUR IM NAIL Right 05/10/2014   Procedure: INTRAMEDULLARY (IM) NAIL FEMORAL;  Surgeon: Marianna Payment, MD;  Location: [facility];  Service: Orthopedics;  Laterality: Right;  . FRACTURE SURGERY    . MASTECTOMY Left  [id number]  . PACEMAKER INSERTION  06/17/2010   Medtronic Protecta VR, model #D334VRG, serial [id number]  . TONSILLECTOMY  1940  . TUBAL LIGATION      OB History    No data available       Home Medications    Prior to Admission medications   Medication Sig Start Date End Date Taking? Authorizing Provider  ADVAIR DISKUS 100-50 MCG/DOSE AEPB INHALE 1 PUFF INTO THE LUNGS 2 (TWO) TIMES DAILY. 10/08/16  Yes Donnamae Jude, MD  albuterol (PROAIR HFA) 108 (90 Base) MCG/ACT inhaler Inhale 2 puffs into the lungs every 6 (six) hours as needed for wheezing or shortness of breath. 12/01/16  Yes Ivar Drape D, PA  aspirin 81  MG chewable tablet Chew 81 mg by mouth daily.   Yes [provider]  Calcium Carbonate-Vitamin D (CALCIUM 600+D) 600-400 MG-UNIT tablet Take 1 tablet by mouth daily.   Yes [provider]  enalapril (VASOTEC) 2.5 MG tablet Take 1 tablet (2.5 mg total) by mouth daily. 01/25/17  Yes Donnamae Jude, MD  ENSURE PLUS (ENSURE PLUS) LIQD Take 237 mLs by mouth daily.    Yes [provider]  furosemide (LASIX) 20 MG tablet TAKE 1 TABLET (20 MG TOTAL) BY MOUTH DAILY.  KEEP APPT Patient taking differently: Take 20 mg by mouth daily.  02/05/17  Yes Lorretta Harp, MD  KLOR-CON M20 20 MEQ tablet TAKE 1 TABLET BY MOUTH EVERY DAY 01/22/17  Yes Donnamae Jude, MD  metoprolol tartrate (LOPRESSOR) 25 MG tablet Take 0.5 tablets (12.5 mg total) by mouth 2 (two) times daily. 01/22/17  Yes Donnamae Jude, MD  Multiple Vitamin (MULTIVITAMIN WITH MINERALS) TABS tablet Take 1 tablet by mouth daily.   Yes [provider]    Family History Family History  Problem Relation Age of Onset  . Heart disease Mother        Pacemaker  . Heart disease Father        No details.    . Hyperlipidemia Sister   . Heart disease Brother        "Heart Attacks"  Died age 73  . Cancer Daughter        Oral cancer and "knot" on right cheek that was cancer    Social History Social History  Substance Use Topics  . Smoking status: Light Tobacco Smoker    Packs/day: 0.10    Years: 44.00    Types: Cigarettes  . Smokeless tobacco: Never Used  . Alcohol use 3.0 oz/week    5 Glasses of wine per week     Allergies   Atorvastatin; Claritin [loratadine]; and Crestor [rosuvastatin]   Review of Systems Review of Systems  Cardiovascular: Positive for syncope.  All other systems reviewed and are negative.    Physical Exam Updated Vital Signs BP 124/71   Pulse 73   Temp 98.4 F (36.9 C) (Oral)   Resp 18   SpO2 97%   Physical Exam  Constitutional: She appears well-developed and well-nourished.  HENT:  Head: Normocephalic and atraumatic.  Eyes: Conjunctivae and EOM are normal.  Neck: Normal range of motion.  Cardiovascular: Normal rate and regular rhythm.   Pulmonary/Chest: Effort normal and breath sounds normal. No stridor. No respiratory distress.  Abdominal: Soft. She exhibits no distension.  Musculoskeletal: She exhibits tenderness (right pelvis).  Neurological: She is alert.  Skin: Skin is warm and dry.  Nursing note and vitals reviewed.    ED Treatments /  Results  Labs (all labs ordered are listed, but only abnormal results are displayed) Labs Reviewed  COMPREHENSIVE METABOLIC PANEL - Abnormal; Notable for the following:       Result Value   Glucose, Bld 102 (*)    BUN 22 (*)    All other components within normal limits  BRAIN NATRIURETIC PEPTIDE - Abnormal; Notable for the following:    B Natriuretic Peptide 424.3 (*)    All other components within normal limits  CBC WITH DIFFERENTIAL/PLATELET  URINALYSIS, ROUTINE W REFLEX MICROSCOPIC  TROPONIN I  CK TOTAL AND CKMB (NOT AT [facility])    EKG  EKG Interpretation  Date/Time:  Thursday February 08 2017 09:01:39 EDT Ventricular Rate:  80 PR Interval:  QRS Duration: 131 QT Interval:  432 QTC Calculation: 499 R Axis:   -21 Text Interpretation:  Sinus rhythm Left bundle branch block Confirmed by Merrily Pew [phone]) on 02/08/2017 9:30:45 AM       Radiology Dg Chest 2 View  Result Date: 02/08/2017 CLINICAL DATA:  All shortness of breath.  Pneumonia. EXAM: CHEST  2 VIEW COMPARISON:  CT chest 12/20/2016. CT thoracic spine this same day. Plain film of the chest 12/20/2016 and 04/19/2016. FINDINGS: There is cardiomegaly without edema. Coarsening of the pulmonary interstitium throughout the right chest does not appear notably changed. Mild interstitial coarsening in the left lung apex and left base are unchanged. No consolidative process, pneumothorax or effusion. Surgical clips left axilla are noted. Aortic atherosclerosis is seen. Remote fracture of the surgical neck of the right humerus is noted. IMPRESSION: Right much worse than left pulmonary fibrosis. No acute abnormality is identified. Cardiomegaly. Atherosclerosis. Electronically Signed   By: Inge Rise M.D.   On: 02/08/2017 15:32   Ct Head Wo Contrast  Result Date: 02/08/2017 CLINICAL DATA:  Syncopal episode last night around 11:30 p.m., causing her to fall and lose consciousness for an unknown amount of time. Headache. EXAM: CT  HEAD WITHOUT CONTRAST TECHNIQUE: Contiguous axial images were obtained from the base of the skull through the vertex without intravenous contrast. COMPARISON:  12/20/2016, 12/28/2015 and earlier. FINDINGS: Brain: Moderate cortical, deep and cerebellar atrophy, unchanged dating back to 2015. Moderate to severe changes of small vessel disease of the white matter, also unchanged. No mass lesion. No midline shift. No acute hemorrhage or hematoma. No extra-axial fluid collections. No evidence of acute infarction. Vascular: Severe bilateral carotid siphon atherosclerosis. Skull: No skull fracture or other focal osseous abnormality involving the skull. Sinuses/Orbits: Minimal fluid in the sphenoid sinuses and in the inferior left mastoid air cells. Remaining visualized paranasal sinuses, bilateral mastoid air cells and bilateral middle ear cavities well-aerated. Orbits intact. Other: None. IMPRESSION: 1. No acute intracranial abnormality. 2. Stable moderate generalized atrophy and moderate to severe chronic microvascular ischemic changes of the white matter. Electronically Signed   By: Evangeline Dakin M.D.   On: 02/08/2017 12:10   Ct Thoracic Spine Wo Contrast  Result Date: 02/08/2017 CLINICAL DATA:  New back pain, head tenderness, the status post fall yesterday. EXAM: CT THORACIC SPINE WITHOUT CONTRAST TECHNIQUE: Multidetector CT images of the thoracic were obtained using the standard protocol without intravenous contrast. COMPARISON:  None. FINDINGS: Alignment: 3 mm anterolisthesis of C6 on C7 and C7 on T1. Severe dextroscoliosis of the thoracolumbar spine. Vertebrae: Chronic T7 vertebral body compression fracture. Remainder the vertebral body heights are maintained. No bone destruction. No aggressive lytic or sclerotic osseous lesion. Paraspinal and other soft tissues: No paraspinal abnormality. Thoracic aortic atherosclerosis. Bilateral carotid artery atherosclerosis. Right lung demonstrates severe chronic  interstitial lung disease primarily involving the lung bases and lung apex. Mild chronic left lung interstitial lung disease. Cardiomegaly. Disc levels: Degenerative disc disease with disc height loss at C5-6, C6-7, T1-2, T2-3, T6-7, T10-11 and T11-12. Mild right foraminal narrowing at T1-2, T2-3, T9-10, T10-11. IMPRESSION: 1.  No acute osseous injury of the thoracic spine. 2. Thoracic spine spondylosis as described above. 3.  Aortic Atherosclerosis (ICD10-170.0) Electronically Signed   By: Kathreen Devoid   On: 02/08/2017 12:18   Ct Pelvis Wo Contrast  Result Date: 02/08/2017 CLINICAL DATA:  Status post fall. Evaluate for hematoma. Back pain. EXAM: CT PELVIS WITHOUT CONTRAST TECHNIQUE: Multidetector CT imaging of the pelvis was performed  following the standard protocol without intravenous contrast. COMPARISON:  None. FINDINGS: Urinary Tract:  No abnormality visualized. Bowel: Unremarkable visualized pelvic bowel loops. Diverticulosis of the sigmoid colon without evidence of diverticulitis. Vascular/Lymphatic: Abdominal aortic atherosclerosis. No lymphadenopathy. Reproductive:  No mass or other significant abnormality Other:  None. Musculoskeletal: Prior ORIF of a healed right intertrochanteric fracture. No hardware failure or complication. No acute fracture or dislocation. Degenerative disc disease with disc height loss at L4-5 and L5-S1. Severe right facet arthropathy at L4-5 and L5-S1. IMPRESSION: 1.  No acute osseous injury of the pelvis. 2. Healed right intertrochanteric fracture transfixed with an intramedullary nail and interlocking femoral neck screw. Electronically Signed   By: Kathreen Devoid   On: 02/08/2017 12:12   Dg Hip Unilat W Or Wo Pelvis 2-3 Views Right  Result Date: 02/08/2017 CLINICAL DATA:  Fall.  Pain. EXAM: DG HIP (WITH OR WITHOUT PELVIS) 2-3V RIGHT COMPARISON:  No recent prior. FINDINGS: ORIF right hip. Hardware intact. Diffuse osteopenia degenerative change. No acute bony abnormality.  Pelvic calcifications consistent phleboliths. Aortoiliac atherosclerotic vascular calcification . IMPRESSION: 1. ORIF right hip.  Hardware intact. No acute bony abnormality. 2.  Aortoiliac atherosclerotic vascular disease. Electronically Signed   By: Marcello Moores  Register   On: 02/08/2017 09:29    Procedures Procedures (including critical care time)  Medications Ordered in ED Medications  ipratropium-albuterol (DUONEB) 0.5-2.5 (3) MG/3ML nebulizer solution 3 mL (3 mLs Nebulization Given 02/08/17 1651)  iopamidol (ISOVUE-370) 76 % injection (not administered)  sodium chloride 0.9 % bolus 1,000 mL (0 mLs Intravenous Stopped 02/08/17 1221)  fentaNYL (SUBLIMAZE) injection 50 mcg (50 mcg Intravenous Given 02/08/17 1115)  HYDROcodone-acetaminophen (NORCO/VICODIN) 5-325 MG per tablet 1 tablet (1 tablet Oral Given 02/08/17 1411)  lactated ringers bolus 500 mL (0 mLs Intravenous Stopped 02/08/17 1629)  predniSONE (DELTASONE) tablet 60 mg (60 mg Oral Given 02/08/17 1652)  furosemide (LASIX) injection 40 mg (40 mg Intravenous Given 02/08/17 1651)  iopamidol (ISOVUE-370) 76 % injection 100 mL (100 mLs Intravenous Contrast Given 02/08/17 1733)     Initial Impression / Assessment and Plan / ED Course  I have reviewed the triage vital signs and the nursing notes.  Pertinent labs & imaging results that were available during my care of the patient were reviewed by me and considered in my medical decision making (see chart for details).     Patient with a significant cardiac history as above with a pacemaker defibrillator in place presents immersed department today with what initially sounded like syncope. Patient seemed to have improved from the syncope standpoint and the hip pain the point where she can bear weight but then became very short of breath and weak while walking with a O2 sat of 82%. She does not worsen at home. She does not state any chest pain or shortness of breath at rest however significantly  tachypneic or walking. Review of records she does have a history of CHF so Lasix was given. Also the history of fibrosis so a DuoNeb was given to see if that would help. Discussed with the hospitalist and will admit for the same. CTA added on to evaluate for PE. I also discussed with the card master for cardiology consultation for help in management.  Final Clinical Impressions(s) / ED Diagnoses   Final diagnoses:  Right hip pain  Syncope, unspecified syncope type  Hypoxia      Asjia Berrios, Corene Cornea, MD 02/08/17 1740

## 2017-02-08 NOTE — ED Notes (Signed)
Hospitalist at bedside. Will medicated when MD finishes patient assessment.

## 2017-02-08 NOTE — ED Notes (Signed)
Bed: ID78 Expected date:  Expected time:  Means of arrival:  Comments: EMS-hip pain

## 2017-02-08 NOTE — ED Notes (Signed)
Patient transported to CT 

## 2017-02-08 NOTE — H&P (Signed)
History and Physical    Brianna [name] ZOX:[id number] DOB: [birth year] DOA: 02/08/2017  PCP: Donnamae Jude, MD   Patient coming from: Home  Chief Complaint: Fall at Home; Worsening SOB  HPI: Brianna [name] is a 83 y.o. female with medical history significant of HTN, HLD, COPD (Not on Home O2 and apparently qualified for it last admission and did not receive it), NICM/Chronic Systolic and Diastolic CHF with EF of 81-19% s/p ICD, Hx of Breast Cancer s/p Chemotherapy, Hx of Syncope and Falls and other comorbids who presented to [facility] with a cc of Right Hip Pain from a fall last night. Patient states that when she gets up too quickly from a seated position, she feels dizzy and lightheaded. Patient states she was getting ready for bed last night when she got up and felt lightheaded and dizzy and fell. She does not recall the fall and does not know what happened but thinks she had hit her head as she had a headache. Patient does not know how long she was unconscious for but remain on the floor unable to get up until her [place] found her this morning and called EMS. Patient states she was laying on her right hip where she had a recent Hip surgery. Denied CP, lightheadedness, or dizziness currently. Patient also admits to having worsening SOB with Ambulation and states she sleeps with 1-2 pillows nightly. She stated that she has not had any coughing and has wheezing at times but was not able to elaborate. Denied any other complaints or concerns. TRH was called to admit for Syncope and Acuter Respiratory Failure with Hypoxia.   ED Course: She had bloodwork done and multiple scans done. Cardiology was called by EDP and she underwent a battery of tests including CT of Head, Thoracic Spine, Pelvis, CXR, Xray of Hip and CTA to r/o PE.   Review of Systems: As per HPI otherwise 10 point review of systems negative. Denied CP, Abdominal Pain, Dysuria and had no N/V/D.   Past Medical History:  Diagnosis  Date  . AICD (automatic cardioverter/defibrillator) present   . Anxiety   . Arthritis    "lower back; knees" (12/20/2016)  . Cancer of left breast ([place])   . Depression   . Emphysema of lung ([place])   . Falls frequently    12/20/2016 "couple falls since 07/2016"  . Heart murmur   . Hyperlipidemia   . Hypertension   . Nonischemic cardiomyopathy ([place])   . Osteopenia   . Pneumonia ~ [id number] X 3  . Presence of permanent cardiac pacemaker     Past Surgical History:  Procedure Laterality Date  . APPENDECTOMY  1943  . BREAST BIOPSY Left  [id number]  . CARDIAC CATHETERIZATION  12/18/2002   Normal coronary arteries and normal LV function  . CARDIAC DEFIBRILLATOR PLACEMENT  10/13/2004   Medtronic Maximo VR, model [id number], serial [id number]  . CATARACT EXTRACTION W/ INTRAOCULAR LENS  IMPLANT, BILATERAL    . FEMUR IM NAIL Right 05/10/2014   Procedure: INTRAMEDULLARY (IM) NAIL FEMORAL;  Surgeon: Marianna Payment, MD;  Location: [place];  Service: Orthopedics;  Laterality: Right;  . FRACTURE SURGERY    . MASTECTOMY Left  [id number]  . PACEMAKER INSERTION  06/17/2010   Medtronic Protecta VR, model #[id number], serial [id number]  . TONSILLECTOMY  1940  . TUBAL LIGATION       reports that she has been smoking Cigarettes.  She has a 4.40 pack-year smoking history. She has never used  smokeless tobacco. She reports that she drinks about 3.0 oz of alcohol per week . She reports that she does not use drugs.  Allergies  Allergen Reactions  . Atorvastatin Other (See Comments)    Reaction:  Fatigue   . Claritin [Loratadine] Other (See Comments)    Reaction:  Fatigue   . Crestor [Rosuvastatin] Other (See Comments)    Reaction:  Muscle pain     Family History  Problem Relation Age of Onset  . Heart disease Mother        Pacemaker  . Heart disease Father        No details.    . Hyperlipidemia Sister   . Heart disease Brother        "Heart Attacks"  Died age 65  . Cancer Daughter        Oral  cancer and "knot" on right cheek that was cancer   Prior to Admission medications   Medication Sig Start Date End Date Taking? Authorizing Provider  ADVAIR DISKUS 100-50 MCG/DOSE AEPB INHALE 1 PUFF INTO THE LUNGS 2 (TWO) TIMES DAILY. 10/08/16  Yes Donnamae Jude, MD  albuterol (PROAIR HFA) 108 (90 Base) MCG/ACT inhaler Inhale 2 puffs into the lungs every 6 (six) hours as needed for wheezing or shortness of breath. 12/01/16  Yes Ivar Drape D, PA  aspirin 81 MG chewable tablet Chew 81 mg by mouth daily.   Yes [provider]  Calcium Carbonate-Vitamin D (CALCIUM 600+D) 600-400 MG-UNIT tablet Take 1 tablet by mouth daily.   Yes [provider]  enalapril (VASOTEC) 2.5 MG tablet Take 1 tablet (2.5 mg total) by mouth daily. 01/25/17  Yes Donnamae Jude, MD  ENSURE PLUS (ENSURE PLUS) LIQD Take 237 mLs by mouth daily.    Yes [provider]  furosemide (LASIX) 20 MG tablet TAKE 1 TABLET (20 MG TOTAL) BY MOUTH DAILY. KEEP APPT Patient taking differently: Take 20 mg by mouth daily.  02/05/17  Yes Lorretta Harp, MD  KLOR-CON M20 20 MEQ tablet TAKE 1 TABLET BY MOUTH EVERY DAY 01/22/17  Yes Donnamae Jude, MD  metoprolol tartrate (LOPRESSOR) 25 MG tablet Take 0.5 tablets (12.5 mg total) by mouth 2 (two) times daily. 01/22/17  Yes Donnamae Jude, MD  Multiple Vitamin (MULTIVITAMIN WITH MINERALS) TABS tablet Take 1 tablet by mouth daily.   Yes [provider]    Physical Exam: Vitals:   02/08/17 1500 02/08/17 1530 02/08/17 1600 02/08/17 1630  BP: 122/65 138/71 126/82 124/71  Pulse: 79 78 76 73  Resp:  18  18  Temp:      TempSrc:      SpO2: 98% 100% 93% 97%   Constitutional: Thin Caucasian elderly female in NAD and appears calm and comfortable Eyes: Lids and conjunctivae normal, sclerae anicteric  ENMT: External Ears, Nose appear normal. Grossly normal hearing. Mucous membranes are slightly dry  Neck: Appears normal, supple, no cervical masses, normal ROM, no  appreciable thyromegaly, no visible JVD Respiratory: Diminished to auscultation bilaterally with coarse breath sounds and some mild rhonchi. Normal respiratory effort and patient is not tachypenic. No accessory muscle use.  Cardiovascular: RRR, no murmurs / rubs / gallops. S1 and S2 auscultated. No extremity edema. Abdomen: Soft, non-tender, non-distended. No masses palpated. No appreciable hepatosplenomegaly. Bowel sounds positive.  GU: Deferred. Musculoskeletal: No clubbing / cyanosis of digits/nails. No joint deformity upper and lower extremities. Patient had Pain on palpation of Right hip Skin: No rashes, lesions, ulcers on a  limited skin evaluation. No induration; Warm and dry.  Neurologic: CN 2-12 grossly intact with no focal deficits. Sensation intact in all 4 Extremities. Romberg sign cerebellar reflexes not assessed.  Psychiatric: Normal judgment and insight. Alert and oriented x 3. Normal mood and appropriate affect.   Labs on Admission: I have personally reviewed following labs and imaging studies  CBC:  Recent Labs Lab 02/08/17 1114  WBC 8.6  NEUTROABS 7.2  HGB 12.8  HCT 38.4  MCV 91.9  PLT 938   Basic Metabolic Panel:  Recent Labs Lab 02/08/17 1114  NA 138  K 4.3  CL 101  CO2 26  GLUCOSE 102*  BUN 22*  CREATININE 0.83  CALCIUM 8.9   GFR: CrCl cannot be calculated (Unknown ideal weight.). Liver Function Tests:  Recent Labs Lab 02/08/17 1114  AST 32  ALT 18  ALKPHOS 73  BILITOT 0.7  PROT 7.8  ALBUMIN 4.1   No results for input(s): LIPASE, AMYLASE in the last 168 hours. No results for input(s): AMMONIA in the last 168 hours. Coagulation Profile: No results for input(s): INR, PROTIME in the last 168 hours. Cardiac Enzymes:  Recent Labs Lab 02/08/17 1114  TROPONINI <0.03   BNP (last 3 results) No results for input(s): PROBNP in the last 8760 hours. HbA1C: No results for input(s): HGBA1C in the last 72 hours. CBG: No results for input(s):  GLUCAP in the last 168 hours. Lipid Profile: No results for input(s): CHOL, HDL, LDLCALC, TRIG, CHOLHDL, LDLDIRECT in the last 72 hours. Thyroid Function Tests: No results for input(s): TSH, T4TOTAL, FREET4, T3FREE, THYROIDAB in the last 72 hours. Anemia Panel: No results for input(s): VITAMINB12, FOLATE, FERRITIN, TIBC, IRON, RETICCTPCT in the last 72 hours. Urine analysis:    Component Value Date/Time   COLORURINE YELLOW 02/08/2017 1430   APPEARANCEUR CLEAR 02/08/2017 1430   LABSPEC 1.014 02/08/2017 1430   PHURINE 6.0 02/08/2017 1430   GLUCOSEU NEGATIVE 02/08/2017 1430   HGBUR NEGATIVE 02/08/2017 1430   BILIRUBINUR NEGATIVE 02/08/2017 1430   BILIRUBINUR NEG 10/26/2010 1435   KETONESUR NEGATIVE 02/08/2017 1430   PROTEINUR NEGATIVE 02/08/2017 1430   UROBILINOGEN 0.2 10/26/2010 1435   UROBILINOGEN 0.2 06/15/2010 2135   NITRITE NEGATIVE 02/08/2017 1430   LEUKOCYTESUR NEGATIVE 02/08/2017 1430   Sepsis Labs: !!!!!!!!!!!!!!!!!!!!!!!!!!!!!!!!!!!!!!!!!!!! @LABRCNTIP (procalcitonin:4,lacticidven:4) )No results found for this or any previous visit (from the past 240 hour(s)).   Radiological Exams on Admission: Dg Chest 2 View  Result Date: 02/08/2017 CLINICAL DATA:  All shortness of breath.  Pneumonia. EXAM: CHEST  2 VIEW COMPARISON:  CT chest 12/20/2016. CT thoracic spine this same day. Plain film of the chest 12/20/2016 and 04/19/2016. FINDINGS: There is cardiomegaly without edema. Coarsening of the pulmonary interstitium throughout the right chest does not appear notably changed. Mild interstitial coarsening in the left lung apex and left base are unchanged. No consolidative process, pneumothorax or effusion. Surgical clips left axilla are noted. Aortic atherosclerosis is seen. Remote fracture of the surgical neck of the right humerus is noted. IMPRESSION: Right much worse than left pulmonary fibrosis. No acute abnormality is identified. Cardiomegaly. Atherosclerosis. Electronically Signed    By: Inge Rise M.D.   On: 02/08/2017 15:32   Ct Head Wo Contrast  Result Date: 02/08/2017 CLINICAL DATA:  Syncopal episode last night around 11:30 p.m., causing her to fall and lose consciousness for an unknown amount of time. Headache. EXAM: CT HEAD WITHOUT CONTRAST TECHNIQUE: Contiguous axial images were obtained from the base of the skull through the vertex without  intravenous contrast. COMPARISON:  12/20/2016, 12/28/2015 and earlier. FINDINGS: Brain: Moderate cortical, deep and cerebellar atrophy, unchanged dating back to 2015. Moderate to severe changes of small vessel disease of the white matter, also unchanged. No mass lesion. No midline shift. No acute hemorrhage or hematoma. No extra-axial fluid collections. No evidence of acute infarction. Vascular: Severe bilateral carotid siphon atherosclerosis. Skull: No skull fracture or other focal osseous abnormality involving the skull. Sinuses/Orbits: Minimal fluid in the sphenoid sinuses and in the inferior left mastoid air cells. Remaining visualized paranasal sinuses, bilateral mastoid air cells and bilateral middle ear cavities well-aerated. Orbits intact. Other: None. IMPRESSION: 1. No acute intracranial abnormality. 2. Stable moderate generalized atrophy and moderate to severe chronic microvascular ischemic changes of the white matter. Electronically Signed   By: Evangeline Dakin M.D.   On: 02/08/2017 12:10   Ct Angio Chest Pe W And/or Wo Contrast  Result Date: 02/08/2017 CLINICAL DATA:  Syncopal episode, found down. Right hip pain status post prior hip replacement. New back pain. Automatic defibrillator in place. EXAM: CT ANGIOGRAPHY CHEST WITH CONTRAST TECHNIQUE: Multidetector CT imaging of the chest was performed using the standard protocol during bolus administration of intravenous contrast. Multiplanar CT image reconstructions and MIPs were obtained to evaluate the vascular anatomy. CONTRAST:  100 cc Isovue 370 COMPARISON:  CTA chest dated  12/20/2016. FINDINGS: Cardiovascular: There is no pulmonary embolism identified within the main, lobar or segmental pulmonary arteries. No aortic aneurysm.  No dissection seen.  Aortic atherosclerosis. Stable cardiomegaly. No pericardial effusion. Coronary artery calcifications. Mediastinum/Nodes: No mass or enlarged lymph nodes seen within the mediastinum or perihilar regions. Esophagus is unremarkable. Trachea is unremarkable. Lungs/Pleura: Chronic interstitial fibrosis appears stable bilaterally, right greater than left. No new lung findings. No evidence of pneumonia. No pleural effusion or pneumothorax. Upper Abdomen: Limited images of the upper abdomen are unremarkable. Musculoskeletal: Stable prominent scoliotic deformity of the thoracolumbar spine. Stable compression deformity of a midthoracic vertebral body. Chronic deformity of the right humeral neck. No acute appearing fracture or displacement. Status post left mastectomy. Review of the MIP images confirms the above findings. IMPRESSION: 1. No acute findings. No pulmonary embolism identified. No pneumonia or pulmonary edema. 2. Chronic interstitial fibrosis within both lungs, right greater than left, stable compared to previous CT. 3. Aortic atherosclerosis. 4. Marked scoliosis of the thoracolumbar spine. Chronic compression deformity of a midthoracic vertebral body. Chronic fracture deformity of the right humeral neck. No acute appearing osseous abnormality. Aortic Atherosclerosis (ICD10-I70.0). Electronically Signed   By: Franki Cabot M.D.   On: 02/08/2017 17:50   Ct Thoracic Spine Wo Contrast  Result Date: 02/08/2017 CLINICAL DATA:  New back pain, head tenderness, the status post fall yesterday. EXAM: CT THORACIC SPINE WITHOUT CONTRAST TECHNIQUE: Multidetector CT images of the thoracic were obtained using the standard protocol without intravenous contrast. COMPARISON:  None. FINDINGS: Alignment: 3 mm anterolisthesis of C6 on C7 and C7 on T1. Severe  dextroscoliosis of the thoracolumbar spine. Vertebrae: Chronic T7 vertebral body compression fracture. Remainder the vertebral body heights are maintained. No bone destruction. No aggressive lytic or sclerotic osseous lesion. Paraspinal and other soft tissues: No paraspinal abnormality. Thoracic aortic atherosclerosis. Bilateral carotid artery atherosclerosis. Right lung demonstrates severe chronic interstitial lung disease primarily involving the lung bases and lung apex. Mild chronic left lung interstitial lung disease. Cardiomegaly. Disc levels: Degenerative disc disease with disc height loss at C5-6, C6-7, T1-2, T2-3, T6-7, T10-11 and T11-12. Mild right foraminal narrowing at T1-2, T2-3, T9-10, T10-11. IMPRESSION: 1.  No acute osseous injury of the thoracic spine. 2. Thoracic spine spondylosis as described above. 3.  Aortic Atherosclerosis (ICD10-170.0) Electronically Signed   By: Kathreen Devoid   On: 02/08/2017 12:18   Ct Pelvis Wo Contrast  Result Date: 02/08/2017 CLINICAL DATA:  Status post fall. Evaluate for hematoma. Back pain. EXAM: CT PELVIS WITHOUT CONTRAST TECHNIQUE: Multidetector CT imaging of the pelvis was performed following the standard protocol without intravenous contrast. COMPARISON:  None. FINDINGS: Urinary Tract:  No abnormality visualized. Bowel: Unremarkable visualized pelvic bowel loops. Diverticulosis of the sigmoid colon without evidence of diverticulitis. Vascular/Lymphatic: Abdominal aortic atherosclerosis. No lymphadenopathy. Reproductive:  No mass or other significant abnormality Other:  None. Musculoskeletal: Prior ORIF of a healed right intertrochanteric fracture. No hardware failure or complication. No acute fracture or dislocation. Degenerative disc disease with disc height loss at L4-5 and L5-S1. Severe right facet arthropathy at L4-5 and L5-S1. IMPRESSION: 1.  No acute osseous injury of the pelvis. 2. Healed right intertrochanteric fracture transfixed with an intramedullary  nail and interlocking femoral neck screw. Electronically Signed   By: Kathreen Devoid   On: 02/08/2017 12:12   Dg Hip Unilat W Or Wo Pelvis 2-3 Views Right  Result Date: 02/08/2017 CLINICAL DATA:  Fall.  Pain. EXAM: DG HIP (WITH OR WITHOUT PELVIS) 2-3V RIGHT COMPARISON:  No recent prior. FINDINGS: ORIF right hip. Hardware intact. Diffuse osteopenia degenerative change. No acute bony abnormality. Pelvic calcifications consistent phleboliths. Aortoiliac atherosclerotic vascular calcification . IMPRESSION: 1. ORIF right hip.  Hardware intact. No acute bony abnormality. 2.  Aortoiliac atherosclerotic vascular disease. Electronically Signed   By: Marcello Moores  Register   On: 02/08/2017 09:29    EKG: Independently reviewed. Showed a Sinus Rhythm with a LBBB that was unchanged from prior EKG  Assessment/Plan Principal Problem:   Syncope and collapse Active Problems:   HYPERCHOLESTEROLEMIA   Essential hypertension   Chronic diastolic heart failure (HCC)   Osteoporosis   History of breast cancer   Automatic implantable cardioverter-defibrillator in situ   Dyspnea   NICM (nonischemic cardiomyopathy) (HCC)   Asthma, chronic   COPD (chronic obstructive pulmonary disease) (HCC)   Syncope   Hypoxia   Right hip pain   Chronic systolic CHF (congestive heart failure) (HCC)   Chronic combined systolic and diastolic CHF (congestive heart failure) (HCC)  Acute Syncopal Episode with Collapse suspect Vasovagal/Orthostatic r/o Other Etiologies  -Place in Obs Telemetry -Unwitnessed Fall and unclear how long patient was on the floor but states prior to that she was dizzy; Does not remember falling but thinks she hit her head -CT Head showed No acute Intracranial Abnormality and stable moderate generalized atrophy and moderate to severe chronic microvascular ischemic changes of the white Matter -Recent ECHO done 6/18 will not repeat; Showed Systolic   function was severely reduced. The estimated ejection fraction    was in the range of 25% to 30%. Diffuse hypokinesis. The study is   not technically sufficient to allow evaluation of LV diastolic   function. -Cycle Cardiac Troponins and Check CK and CKMB given how long patient was on the floor, [facility] for further evaluation and Management  -Per EDP AICD was interrogated; Last Cardiology Hospital Follow up showed interrogation of ICD and did not suggest arrhythmogenic cause however per record has a Hx of Ventricular Tachycardia -Check Orthostatics -Gentle IVF Rehydration with NS at 75 mL/hr x 15 hours as patient appeared dry  -PT/OT Consultation given Fall   Acute Respiratory Failure with Hypoxia -Patient has  coarse breath sounds with minimal rhonchi; ?Pulmonary Fibrosis  -CXR showed There is cardiomegaly without edema. Coarsening of the pulmonary interstitium throughout the right chest does not appear notably changed. Mild interstitial coarsening in the left lung apex and left base are unchanged. No consolidative process, pneumothorax or effusion. Surgical clips left axilla are noted. Aortic atherosclerosis is seen. Remote fracture of the surgical neck of the right humerus is noted. -EDP ordered CT Angio of the Chest to r/o PE -Continue with Supplemental O2 via [place] and Continuous Pulse Oximetry -Maintain O2 Saturations >92% and Wean O2 as tolerated -Patient denies any cough or productive sputum but states she gets very dyspneic with activity and ambulation.  -Per record Review patient qualified for O2 for Ambulating Saturation of 88% but no Recovery Sat of Patient on O2 was documented so O2 was not delivered and patient was not interested in obtaining O2 at that time (June 11th after last Hospital D/C) -Have ordered Home O2 Screen with Patient Ambulating with Pulse Oximetry for AM  Right Hip Pain from Fall -Right Hip Xray shows ORIF right hip. Hardware intact. Diffuse osteopenia degenerative change. No acute bony abnormality. Pelvic  calcifications consistent phleboliths. Aortoiliac atherosclerotic vascular calcification . -CT of Pelvis shows No acute osseous injury of the pelvis. Healed right intertrochanteric fracture transfixed with an intramedullary nail and interlocking femoral neck screw -C/w Pain Control -PT/OT to Evaluate and Treat   Chronic Combined Systolic and Diastolic CHF/NICM 2/2 breast Chemotherapy with ECHO showing EF of 25-30% -S/p ICD Implantation by Dr. Mitzi Davenport in March 2006 -Recent ECHO Done in June -C/w ASA, BB, and ACE; Not on Statin  -Monitor Volume Status carefully and hold Home Lasix 20 mg po Daily for now -Check BNP -Cardiac Troponin's being cycled as above -Patient appeared to be dry so will start IVF with NS at 75 mL/hr x 15 hours and check Orthostatics in AM  HTN -She is on Enalapril (2.5 mg) and Metoprolol and will continue -Check Orthostatics in AM  HLD -Not on Statin -Per PCP Note Last Check was WNL and will need to recheck next visit when fasting   COPD  -On Advair and Proair at Home -C/w DuoNebs and Chiropodist -Patient received 1 dose of po Steroids by EDP  DVT prophylaxis: Lovenox Sq Code Status: DO NOT RESUSCITATE Family Communication: Family present at bedside  Disposition Plan: Pending PT Evaluation Consults called: Cardiology called by EDP Admission status: Obs Telemetry  Severity of Illness: The appropriate patient status for this patient is OBSERVATION. Observation status is judged to be reasonable and necessary in order to provide the required intensity of service to ensure the patient's safety. The patient's presenting symptoms, physical exam findings, and initial radiographic and laboratory data in the context of their medical condition is felt to place them at decreased risk for further clinical deterioration. Furthermore, it is anticipated that the patient will be medically stable for discharge from the hospital within 2 midnights of admission. The following  factors support the patient status of observation.   " The patient's presenting symptoms include syncope and collapse and SOB. " The physical exam findings include Crackles of the Lungs. " The initial radiographic and laboratory data are Reassuring.  Kerney Elbe, D.O. [facility] Pager [phone]  If 7PM-7AM, please contact night-coverage [email or web address] Password [facility]  02/08/2017, 6:13 PM

## 2017-02-08 NOTE — ED Triage Notes (Addendum)
Pt brought by EMS for right hip pain at site of prior hip replacement. Pt stood up quickly last night, had syncopal episode, and fell to ground around 2330. Unsure how long she was unconscious. Slept on floor that night. Some head tenderness and new back pain. No anticoagulants. Automatic defibrillator in place.

## 2017-02-09 ENCOUNTER — Observation Stay (HOSPITAL_COMMUNITY): Payer: Medicare Other

## 2017-02-09 ENCOUNTER — Encounter (HOSPITAL_COMMUNITY): Payer: Self-pay | Admitting: Nurse Practitioner

## 2017-02-09 DIAGNOSIS — J439 Emphysema, unspecified: Secondary | ICD-10-CM | POA: Diagnosis not present

## 2017-02-09 DIAGNOSIS — R0602 Shortness of breath: Secondary | ICD-10-CM

## 2017-02-09 DIAGNOSIS — Z95810 Presence of automatic (implantable) cardiac defibrillator: Secondary | ICD-10-CM | POA: Diagnosis not present

## 2017-02-09 DIAGNOSIS — I5032 Chronic diastolic (congestive) heart failure: Secondary | ICD-10-CM | POA: Diagnosis not present

## 2017-02-09 DIAGNOSIS — I428 Other cardiomyopathies: Secondary | ICD-10-CM | POA: Diagnosis not present

## 2017-02-09 DIAGNOSIS — R55 Syncope and collapse: Secondary | ICD-10-CM | POA: Diagnosis not present

## 2017-02-09 DIAGNOSIS — R0600 Dyspnea, unspecified: Secondary | ICD-10-CM | POA: Diagnosis not present

## 2017-02-09 DIAGNOSIS — I10 Essential (primary) hypertension: Secondary | ICD-10-CM | POA: Diagnosis not present

## 2017-02-09 DIAGNOSIS — I5022 Chronic systolic (congestive) heart failure: Secondary | ICD-10-CM | POA: Diagnosis not present

## 2017-02-09 DIAGNOSIS — R0902 Hypoxemia: Secondary | ICD-10-CM | POA: Diagnosis not present

## 2017-02-09 DIAGNOSIS — M25551 Pain in right hip: Secondary | ICD-10-CM | POA: Diagnosis not present

## 2017-02-09 DIAGNOSIS — I5042 Chronic combined systolic (congestive) and diastolic (congestive) heart failure: Secondary | ICD-10-CM | POA: Diagnosis not present

## 2017-02-09 DIAGNOSIS — Z853 Personal history of malignant neoplasm of breast: Secondary | ICD-10-CM | POA: Diagnosis not present

## 2017-02-09 LAB — COMPREHENSIVE METABOLIC PANEL
ALT: 16 U/L (ref 14–54)
AST: 32 U/L (ref 15–41)
Albumin: 3.6 g/dL (ref 3.5–5.0)
Alkaline Phosphatase: 65 U/L (ref 38–126)
Anion gap: 11 (ref 5–15)
BUN: 21 mg/dL — ABNORMAL HIGH (ref 6–20)
CHLORIDE: 101 mmol/L (ref 101–111)
CO2: 25 mmol/L (ref 22–32)
CREATININE: 0.93 mg/dL (ref 0.44–1.00)
Calcium: 8.7 mg/dL — ABNORMAL LOW (ref 8.9–10.3)
GFR calc non Af Amer: 55 mL/min — ABNORMAL LOW (ref >60)
Glucose, Bld: 129 mg/dL — ABNORMAL HIGH (ref 65–99)
POTASSIUM: 4.2 mmol/L (ref 3.5–5.1)
SODIUM: 137 mmol/L (ref 135–145)
Total Bilirubin: 0.8 mg/dL (ref 0.3–1.2)
Total Protein: 7.4 g/dL (ref 6.5–8.1)

## 2017-02-09 LAB — CBC
HEMATOCRIT: 38.6 % (ref 36.0–46.0)
HEMOGLOBIN: 13.3 g/dL (ref 12.0–15.0)
MCH: 31.7 pg (ref 26.0–34.0)
MCHC: 34.5 g/dL (ref 30.0–36.0)
MCV: 91.9 fL (ref 78.0–100.0)
PLATELETS: 153 10*3/uL (ref 150–400)
RBC: 4.2 MIL/uL (ref 3.87–5.11)
RDW: 13.6 % (ref 11.5–15.5)
WBC: 6.1 10*3/uL (ref 4.0–10.5)

## 2017-02-09 LAB — TROPONIN I: Troponin I: <0.03 ng/mL (ref <0.03)

## 2017-02-09 LAB — PROTIME-INR
INR: 1.07
Prothrombin Time: 13.9 seconds (ref 11.4–15.2)

## 2017-02-09 LAB — GLUCOSE, CAPILLARY
GLUCOSE-CAPILLARY: 115 mg/dL — AB (ref 65–99)
GLUCOSE-CAPILLARY: 108 mg/dL — AB (ref 65–99)

## 2017-02-09 MED ORDER — ENSURE ENLIVE PO LIQD: 237.0000 mL | Freq: Two times a day (BID) | ORAL | 12 refills | Status: AC

## 2017-02-09 MED ORDER — ENSURE ENLIVE PO LIQD: 237.0000 mL | Freq: Two times a day (BID) | ORAL | Status: DC

## 2017-02-09 MED ORDER — POLYETHYLENE GLYCOL 3350 17 G PO PACK: 17.0000 g | PACK | Freq: Every day | ORAL | 0 refills | Status: AC | PRN

## 2017-02-09 MED ORDER — FUROSEMIDE 20 MG PO TABS: 20.0000 mg | ORAL_TABLET | ORAL | 0 refills | Status: DC

## 2017-02-09 MED ORDER — ROLLATOR MISC: 1.0000 [IU] | 0 refills | Status: DC | PRN

## 2017-02-09 MED ORDER — IPRATROPIUM-ALBUTEROL 0.5-2.5 (3) MG/3ML IN SOLN: 3.0000 mL | Freq: Three times a day (TID) | RESPIRATORY_TRACT | 0 refills | Status: DC

## 2017-02-09 NOTE — Evaluation (Signed)
Occupational Therapy Evaluation Patient Details Name: Brianna [name] MRN: [id number] DOB: 02-27-1930 Today's Date: 02/09/2017    History of Present Illness 83 y.o. female with medical history significant of HTN, HLD, COPD (Not on Home O2 and apparently qualified for it last admission and did not receive it), NICM/Chronic Systolic and Diastolic CHF with EF of 27-74% s/p ICD, Hx of Breast Cancer s/p Chemotherapy, Hx of Syncope and Falls and other comorbids who presented to [facility] with a cc of Right Hip Pain from a fall last night and admitted for acute syncopal episode.   Clinical Impression   Pt reports she was mod I with BADL PTA. Currently pt requires min assist for functional mobility and mod assist for LB ADL. DOE 3/4; educated on pursed lip breathing. Pt planning to d/c home alone with intermittent supervision from family and PCA. Recommending HHOT for follow up to maximize independence and safety with ADL and functional mobility upon return home. Pt would benefit from continued skilled OT to address established goals.    Follow Up Recommendations  Home health OT;Supervision/Assistance - 24 hour    Equipment Recommendations  None recommended by OT    Recommendations for Other Services       Precautions / Restrictions Precautions Precautions: Fall Precaution Comments: monitor sats Restrictions Weight Bearing Restrictions: No      Mobility Bed Mobility Overal bed mobility: Needs Assistance Bed Mobility: Supine to Sit     Supine to sit: HOB elevated;Mod assist     General bed mobility comments: Pt OOB in chair upon arrival  Transfers Overall transfer level: Needs assistance Equipment used: Rolling walker (2 wheeled) Transfers: Sit to/from Stand Sit to Stand: Min assist         General transfer comment: assist to boost up and for steadying in standing.    Balance Overall balance assessment: Needs assistance;History of Falls Sitting-balance support: Feet  supported;No upper extremity supported Sitting balance-Leahy Scale: Fair     Standing balance support: Bilateral upper extremity supported Standing balance-Leahy Scale: Poor                             ADL either performed or assessed with clinical judgement   ADL Overall ADL's : Needs assistance/impaired Eating/Feeding: Set up;Sitting   Grooming: Set up;Sitting   Upper Body Bathing: Set up;Supervision/ safety;Sitting   Lower Body Bathing: Moderate assistance;Sit to/from stand   Upper Body Dressing : Set up;Supervision/safety;Sitting   Lower Body Dressing: Moderate assistance;Sit to/from stand   Toilet Transfer: Minimal assistance;Ambulation;BSC;RW   Toileting- Clothing Manipulation and Hygiene: Minimal assistance;Sit to/from stand       Functional mobility during ADLs: Minimal assistance;Rolling walker General ADL Comments: Cues for pursed lip breathing; DOE 3/4.     Vision         Perception     Praxis      Pertinent Vitals/Pain Pain Assessment: Faces Pain Score: 5  Faces Pain Scale: Hurts little more Pain Location: R hip Pain Descriptors / Indicators: Sore Pain Intervention(s): Monitored during session     Hand Dominance     Extremity/Trunk Assessment Upper Extremity Assessment Upper Extremity Assessment: Generalized weakness   Lower Extremity Assessment Lower Extremity Assessment: Defer to PT evaluation   Cervical / Trunk Assessment Cervical / Trunk Assessment: Kyphotic   Communication Communication Communication: No difficulties   Cognition Arousal/Alertness: Awake/alert Behavior During Therapy: WFL for tasks assessed/performed Overall Cognitive Status: Within Functional Limits for tasks assessed  General Comments       Exercises     Shoulder Instructions      Home Living Family/patient expects to be discharged to:: Private residence Living Arrangements: Alone Available  Help at Discharge: Home health;Personal care attendant (5 days per week; 7-3) Type of Home: House Home Access: Stairs to enter [employer] of Steps: 5 Entrance Stairs-Rails: Right Home Layout: One level     Bathroom Shower/Tub: Teacher, early years/pre: Standard Bathroom Accessibility: No   Home Equipment: Environmental consultant - 2 wheels;Bedside commode;Tub bench          Prior Functioning/Environment Level of Independence: Needs assistance  Gait / Transfers Assistance Needed: RW for mobility ADL's / Homemaking Assistance Needed: Pt mod I with BADL, PCA assists with groceries, cooking, cleaning as needed            OT Problem List: Decreased strength;Decreased activity tolerance;Impaired balance (sitting and/or standing);Decreased knowledge of use of DME or AE;Cardiopulmonary status limiting activity;Pain      OT Treatment/Interventions: Self-care/ADL training;Energy conservation;DME and/or AE instruction;Therapeutic activities;Patient/family education;Balance training    OT Goals(Current goals can be found in the care plan section) Acute Rehab OT Goals Patient Stated Goal: return home OT Goal Formulation: With patient/family Time For Goal Achievement: 02/23/17 Potential to Achieve Goals: Good ADL Goals Pt Will Perform Lower Body Bathing: with supervision;sit to/from stand Pt Will Perform Lower Body Dressing: with supervision;sit to/from stand Pt Will Transfer to Toilet: with supervision;ambulating;bedside commode Pt Will Perform Toileting - Clothing Manipulation and hygiene: with supervision;sit to/from stand Additional ADL Goal #1: Pt will independently verablly recall 3 energy conservation strategies and use during ADL.  OT Frequency: Min 2X/week   Barriers to D/C: Decreased caregiver support  pt lives alone       Co-evaluation              AM-PAC PT "6 Clicks" Daily Activity     Outcome Measure Help from another person eating meals?: None Help  from another person taking care of personal grooming?: A Little Help from another person toileting, which includes using toliet, bedpan, or urinal?: A Little Help from another person bathing (including washing, rinsing, drying)?: A Lot Help from another person to put on and taking off regular upper body clothing?: A Little Help from another person to put on and taking off regular lower body clothing?: A Lot 6 Click Score: 17   End of Session Equipment Utilized During Treatment: Rolling walker;Oxygen Nurse Communication: Mobility status;Other (comment) (pt had BM, removed pure wick)  Activity Tolerance: Patient tolerated treatment well Patient left: in chair;with call bell/phone within reach;with chair alarm set;with family/visitor present  OT Visit Diagnosis: Unsteadiness on feet (R26.81);Repeated falls (R29.6);Muscle weakness (generalized) (M62.81);Pain Pain - Right/Left: Right Pain - part of body: Hip                Time: 6546-5035 OT Time Calculation (min): 19 min Charges:  OT General Charges $OT Visit: 1 Procedure OT Evaluation $OT Eval Moderate Complexity: 1 Procedure G-Codes: OT G-codes **NOT FOR INPATIENT CLASS** Functional Assessment Tool Used: AM-PAC 6 Clicks Daily Activity Functional Limitation: Self care Self Care Current Status (W6568): At least 40 percent but less than 60 percent impaired, limited or restricted Self Care Goal Status (L2751): At least 1 percent but less than 20 percent impaired, limited or restricted   Mel Almond A. Ulice Brilliant, M.S., OTR/L Pager: [phone] 02/09/2017, 1:31 PM

## 2017-02-09 NOTE — Consult Note (Signed)
Cardiology Consult    Patient ID: Brianna [name] MRN: [id number], DOB/AGE: [birth year]   Admit date: 02/08/2017 Date of Consult: 02/09/2017  Primary Physician: Donnamae Jude, MD Primary Cardiologist: Adora Fridge, MD / Jerilynn Mages. Croitoru, MD  Requesting Provider: Jenetta Downer. Sheikh  Patient Profile    Brianna [name] is a 83 y.o. female with a history of HFrEF, HTN, NICM, orthostasis, and falls, who is being seen today for the evaluation of pre-syncope at the request of Dr. Alfredia Ferguson.  Past Medical History   Past Medical History:  Diagnosis Date  . AICD (automatic cardioverter/defibrillator) present    a. 05/2010 MDT D33VRG Protecta VR single lead AICD (ser # [id number]).  Marland Kitchen Anxiety   . Arthritis    "lower back; knees" (12/20/2016)  . Cancer of left breast ([place])   . Depression   . Emphysema of lung ([place])   . Falls frequently    12/20/2016 "couple falls since 07/2016"  . Heart murmur   . HFrEF (heart failure with reduced ejection fraction) ([place])    a. 12/2015 Echo: EF 40-45%;  b. 12/2016 Echo: EF 25-30%, diff HK.  Marland Kitchen Hyperlipidemia   . Hypertension   . Nonischemic cardiomyopathy ([place])    a. Felt to be 2/2 ChemoRx for Breast CA;  b. 2006 s/p AICD;  c. 05/2010 ICD upgrade: MDT D33VRG Protecta VR single lead AICD (ser # [id number]);  d. 12/2015 Echo: EF 40-45%;  e. 12/2016 Echo: EF 25-30%, diff HK, mild MR, PASP 66mmHg.  . Osteopenia   . Paroxysmal atrial fibrillation ([place])    a. Previously noted on device interrogation for brief periods. No OAC 2/2 falls/frailty.  . Pneumonia ~ [id number] X 3    Past Surgical History:  Procedure Laterality Date  . APPENDECTOMY  1943  . BREAST BIOPSY Left  [id number]  . CARDIAC CATHETERIZATION  12/18/2002   Normal coronary arteries and normal LV function  . CARDIAC DEFIBRILLATOR PLACEMENT  10/13/2004   Medtronic Maximo VR, model [id number], serial [id number]  . CATARACT EXTRACTION W/ INTRAOCULAR LENS  IMPLANT, BILATERAL    . FEMUR IM NAIL Right 05/10/2014   Procedure:  INTRAMEDULLARY (IM) NAIL FEMORAL;  Surgeon: Marianna Payment, MD;  Location: [facility];  Service: Orthopedics;  Laterality: Right;  . FRACTURE SURGERY    . MASTECTOMY Left  [id number]  . PACEMAKER INSERTION  06/17/2010   Medtronic Protecta VR, model #D334VRG, serial [id number]  . TONSILLECTOMY  1940  . TUBAL LIGATION       Allergies  Allergies  Allergen Reactions  . Atorvastatin Other (See Comments)    Reaction:  Fatigue   . Claritin [Loratadine] Other (See Comments)    Reaction:  Fatigue   . Crestor [Rosuvastatin] Other (See Comments)    Reaction:  Muscle pain     History of Present Illness    83 year old female with the above complex past medical history including HFrEF, nonischemic cardio myopathy felt to be secondary to chemotherapy for breast cancer, AICD placement in 2006 with upgrade in 2011, hypertension, hyperlipidemia, and orthostasis with falls. She was recently admitted in late May following a syncopal episode. Device interrogation at that time did not show any ventricular arrhythmias, but did show brief episodes of paroxysmal atrial fibrillation. Due to frailty and falls, decision was made to not use oral anticoagulation. A recommendation was made for an outpatient 30 day event monitor. Echocardiogram showed an EF of 25-30% with diffuse hypokinesis, which was down from previously noted EF of 40-45% in June 2017. Conservative  medical therapy was recommended. She followed up with Dr. Gwenlyn Found in the office on June 2 and was doing well at that time.  She says that since her discharge from the hospital in June, she has had intermittent lightheadedness that occurs when standing up. She says she is taking measures to try and prevent this by getting up more slowly and just being more careful in general. On July 19, she was sitting in her living room and then stood up. She medially felt lightheaded and unsteady on her feet. She felt as though her vision was blackening. She fell to the floor  and remembers both falling and also hitting the floor. She did not lose consciousness. She tried to get up off the floor but was too weak to get herself up. She was not expressing any chest pain, dyspnea, palpitations, nausea, vomiting, or diaphoresis. She was able to get herself off of the floor, she says that she thinks she just laid down and fell asleep. At some point later, she awakened and hit the button on her medical alert, which was around her neck. At that point, the fire department came and got her. She is unsure as to how long she was on the floor. In the emergency department, she was hemodynamically stable. Labs were relatively unrevealing. Troponins were normal. She was admitted for further evaluation. She currently denies chest pain, presyncope, or dyspnea. No events on telemetry. Device interrogation pending.   Inpatient Medications    . aspirin  81 mg Oral Daily  . calcium-vitamin D  1 tablet Oral Q breakfast  . enalapril  2.5 mg Oral Daily  . enoxaparin (LOVENOX) injection  40 mg Subcutaneous Q24H  . ipratropium-albuterol  3 mL Nebulization TID  . metoprolol tartrate  12.5 mg Oral BID  . multivitamin with minerals  1 tablet Oral Daily  . sodium chloride flush  3 mL Intravenous Q12H    Family History    Family History  Problem Relation Age of Onset  . Heart disease Mother        Pacemaker  . Heart disease Father        No details.    . Hyperlipidemia Sister   . Heart disease Brother        "Heart Attacks"  Died age 56  . Cancer Daughter        Oral cancer and "knot" on right cheek that was cancer    Social History    Social History   Social History  . Marital status: Widowed    Spouse name: N/A  . Number of children: 2  . Years of education: 11   Occupational History  . Retired-Manager Writer. Retired   Social History Main Topics  . Smoking status: Light Tobacco Smoker    Packs/day: 0.10    Years: 44.00    Types: Cigarettes  . Smokeless tobacco:  Never Used  . Alcohol use 3.0 oz/week    5 Glasses of wine per week  . Drug use: No  . Sexual activity: No   Other Topics Concern  . Not on file   Social History Narrative   Health Care POA:    Emergency Contact: friend, Trevor Iha, [phone]   End of Life Plan:    Who lives with you: self, has medical alert button    Any pets: none   Diet: Patient has a varied diet and controls portions well.   Exercise: Patient does not have any regular exercise routine.  Seatbelts: Patient reports wearing seatbelt when in vehicle.   Nancy Fetter Exposure/Protection: Patient reports wearing sun screening daily.   Hobbies: clothes alterations                 Review of Systems    General:  No chills, fever, night sweats or weight changes.  Cardiovascular:  +++ presyncope/orthostasis.  No chest pain, dyspnea on exertion, edema, orthopnea, palpitations, paroxysmal nocturnal dyspnea. Dermatological: No rash, lesions/masses Respiratory: No cough, dyspnea Urologic: No hematuria, dysuria Abdominal:   No nausea, vomiting, diarrhea, bright red blood per rectum, melena, or hematemesis Neurologic:  No visual changes, wkns, changes in mental status. All other systems reviewed and are otherwise negative except as noted above.  Physical Exam    Blood pressure 121/66, pulse 64, temperature 98.3 F (36.8 C), temperature source Oral, resp. rate 18, height 5\' 5"  (1.651 m), weight 142 lb 13.7 oz (64.8 kg), SpO2 96 %.  General: Pleasant, NAD Psych: Normal affect. Neuro: Alert and oriented X 3. Moves all extremities spontaneously. HEENT: Normal  Neck: Supple without bruits or JVD. Lungs:  Resp regular and unlabored. Coarse breath sounds bilaterally.  Heart: RRR no s3, s4, or murmurs. Abdomen: Soft, non-tender, non-distended, BS + x 4.  Extremities: No clubbing, cyanosis or edema. DP/PT/Radials 2+ and equal bilaterally.  Labs      Recent Labs  02/08/17 1114 02/08/17 2011 02/09/17 0135 02/09/17 0732    CKTOTAL 264*  --   --   --   CKMB 3.3  --   --   --   TROPONINI <0.03 <0.03 <0.03 <0.03   Lab Results  Component Value Date   WBC 6.1 02/09/2017   HGB 13.3 02/09/2017   HCT 38.6 02/09/2017   MCV 91.9 02/09/2017   PLT 153 02/09/2017     Recent Labs Lab 02/09/17 0135  NA 137  K 4.2  CL 101  CO2 25  BUN 21*  CREATININE 0.93  CALCIUM 8.7*  PROT 7.4  BILITOT 0.8  ALKPHOS 65  ALT 16  AST 32  GLUCOSE 129*   Lab Results  Component Value Date   CHOL 170 12/22/2014   HDL 81 12/22/2014   LDLCALC 77 12/22/2014   TRIG 59 12/22/2014     Radiology Studies    X-ray Chest Pa And Lateral  Result Date: 02/09/2017 CLINICAL DATA:  Short of breath and weakness for 24 hours. EXAM: CHEST  2 VIEW COMPARISON:  Yesterday FINDINGS: Right subclavian AICD device and leads are stable and intact. The heart remains mildly enlarged. Interstitial lung disease right greater than left is stable. There is volume loss in the right lung with shift of the mediastinum to the right. Low lung volumes. Prominent aorta. Pleural thickening at the lung apices is stable. Stable deformity of the proximal right humerus. IMPRESSION: Cardiomegaly. Stable interstitial lung disease. Electronically Signed   By: Marybelle Killings M.D.   On: 02/09/2017 09:04   Dg Chest 2 View  Result Date: 02/08/2017 CLINICAL DATA:  All shortness of breath.  Pneumonia. EXAM: CHEST  2 VIEW COMPARISON:  CT chest 12/20/2016. CT thoracic spine this same day. Plain film of the chest 12/20/2016 and 04/19/2016. FINDINGS: There is cardiomegaly without edema. Coarsening of the pulmonary interstitium throughout the right chest does not appear notably changed. Mild interstitial coarsening in the left lung apex and left base are unchanged. No consolidative process, pneumothorax or effusion. Surgical clips left axilla are noted. Aortic atherosclerosis is seen. Remote fracture of the surgical neck of the  right humerus is noted. IMPRESSION: Right much worse than  left pulmonary fibrosis. No acute abnormality is identified. Cardiomegaly. Atherosclerosis. Electronically Signed   By: Inge Rise M.D.   On: 02/08/2017 15:32   Ct Head Wo Contrast  Result Date: 02/08/2017 CLINICAL DATA:  Syncopal episode last night around 11:30 p.m., causing her to fall and lose consciousness for an unknown amount of time. Headache. EXAM: CT HEAD WITHOUT CONTRAST TECHNIQUE: Contiguous axial images were obtained from the base of the skull through the vertex without intravenous contrast. COMPARISON:  12/20/2016, 12/28/2015 and earlier. FINDINGS: Brain: Moderate cortical, deep and cerebellar atrophy, unchanged dating back to 2015. Moderate to severe changes of small vessel disease of the white matter, also unchanged. No mass lesion. No midline shift. No acute hemorrhage or hematoma. No extra-axial fluid collections. No evidence of acute infarction. Vascular: Severe bilateral carotid siphon atherosclerosis. Skull: No skull fracture or other focal osseous abnormality involving the skull. Sinuses/Orbits: Minimal fluid in the sphenoid sinuses and in the inferior left mastoid air cells. Remaining visualized paranasal sinuses, bilateral mastoid air cells and bilateral middle ear cavities well-aerated. Orbits intact. Other: None. IMPRESSION: 1. No acute intracranial abnormality. 2. Stable moderate generalized atrophy and moderate to severe chronic microvascular ischemic changes of the white matter. Electronically Signed   By: Evangeline Dakin M.D.   On: 02/08/2017 12:10   Ct Angio Chest Pe W And/or Wo Contrast  Result Date: 02/08/2017 CLINICAL DATA:  Syncopal episode, found down. Right hip pain status post prior hip replacement. New back pain. Automatic defibrillator in place. EXAM: CT ANGIOGRAPHY CHEST WITH CONTRAST TECHNIQUE: Multidetector CT imaging of the chest was performed using the standard protocol during bolus administration of intravenous contrast. Multiplanar CT image reconstructions  and MIPs were obtained to evaluate the vascular anatomy. CONTRAST:  100 cc Isovue 370 COMPARISON:  CTA chest dated 12/20/2016. FINDINGS: Cardiovascular: There is no pulmonary embolism identified within the main, lobar or segmental pulmonary arteries. No aortic aneurysm.  No dissection seen.  Aortic atherosclerosis. Stable cardiomegaly. No pericardial effusion. Coronary artery calcifications. Mediastinum/Nodes: No mass or enlarged lymph nodes seen within the mediastinum or perihilar regions. Esophagus is unremarkable. Trachea is unremarkable. Lungs/Pleura: Chronic interstitial fibrosis appears stable bilaterally, right greater than left. No new lung findings. No evidence of pneumonia. No pleural effusion or pneumothorax. Upper Abdomen: Limited images of the upper abdomen are unremarkable. Musculoskeletal: Stable prominent scoliotic deformity of the thoracolumbar spine. Stable compression deformity of a midthoracic vertebral body. Chronic deformity of the right humeral neck. No acute appearing fracture or displacement. Status post left mastectomy. Review of the MIP images confirms the above findings. IMPRESSION: 1. No acute findings. No pulmonary embolism identified. No pneumonia or pulmonary edema. 2. Chronic interstitial fibrosis within both lungs, right greater than left, stable compared to previous CT. 3. Aortic atherosclerosis. 4. Marked scoliosis of the thoracolumbar spine. Chronic compression deformity of a midthoracic vertebral body. Chronic fracture deformity of the right humeral neck. No acute appearing osseous abnormality. Aortic Atherosclerosis (ICD10-I70.0). Electronically Signed   By: Franki Cabot M.D.   On: 02/08/2017 17:50   Ct Thoracic Spine Wo Contrast  Result Date: 02/08/2017 CLINICAL DATA:  New back pain, head tenderness, the status post fall yesterday. EXAM: CT THORACIC SPINE WITHOUT CONTRAST TECHNIQUE: Multidetector CT images of the thoracic were obtained using the standard protocol without  intravenous contrast. COMPARISON:  None. FINDINGS: Alignment: 3 mm anterolisthesis of C6 on C7 and C7 on T1. Severe dextroscoliosis of the thoracolumbar spine. Vertebrae: Chronic T7  vertebral body compression fracture. Remainder the vertebral body heights are maintained. No bone destruction. No aggressive lytic or sclerotic osseous lesion. Paraspinal and other soft tissues: No paraspinal abnormality. Thoracic aortic atherosclerosis. Bilateral carotid artery atherosclerosis. Right lung demonstrates severe chronic interstitial lung disease primarily involving the lung bases and lung apex. Mild chronic left lung interstitial lung disease. Cardiomegaly. Disc levels: Degenerative disc disease with disc height loss at C5-6, C6-7, T1-2, T2-3, T6-7, T10-11 and T11-12. Mild right foraminal narrowing at T1-2, T2-3, T9-10, T10-11. IMPRESSION: 1.  No acute osseous injury of the thoracic spine. 2. Thoracic spine spondylosis as described above. 3.  Aortic Atherosclerosis (ICD10-170.0) Electronically Signed   By: Kathreen Devoid   On: 02/08/2017 12:18   Ct Pelvis Wo Contrast  Result Date: 02/08/2017 CLINICAL DATA:  Status post fall. Evaluate for hematoma. Back pain. EXAM: CT PELVIS WITHOUT CONTRAST TECHNIQUE: Multidetector CT imaging of the pelvis was performed following the standard protocol without intravenous contrast. COMPARISON:  None. FINDINGS: Urinary Tract:  No abnormality visualized. Bowel: Unremarkable visualized pelvic bowel loops. Diverticulosis of the sigmoid colon without evidence of diverticulitis. Vascular/Lymphatic: Abdominal aortic atherosclerosis. No lymphadenopathy. Reproductive:  No mass or other significant abnormality Other:  None. Musculoskeletal: Prior ORIF of a healed right intertrochanteric fracture. No hardware failure or complication. No acute fracture or dislocation. Degenerative disc disease with disc height loss at L4-5 and L5-S1. Severe right facet arthropathy at L4-5 and L5-S1. IMPRESSION: 1.  No  acute osseous injury of the pelvis. 2. Healed right intertrochanteric fracture transfixed with an intramedullary nail and interlocking femoral neck screw. Electronically Signed   By: Kathreen Devoid   On: 02/08/2017 12:12   Dg Hip Unilat W Or Wo Pelvis 2-3 Views Right  Result Date: 02/08/2017 CLINICAL DATA:  Fall.  Pain. EXAM: DG HIP (WITH OR WITHOUT PELVIS) 2-3V RIGHT COMPARISON:  No recent prior. FINDINGS: ORIF right hip. Hardware intact. Diffuse osteopenia degenerative change. No acute bony abnormality. Pelvic calcifications consistent phleboliths. Aortoiliac atherosclerotic vascular calcification . IMPRESSION: 1. ORIF right hip.  Hardware intact. No acute bony abnormality. 2.  Aortoiliac atherosclerotic vascular disease. Electronically Signed   By: Marcello Moores  Register   On: 02/08/2017 09:29    ECG & Cardiac Imaging     regular sinus rhythm, 80, left bundle branch block, no acute ST or T changes  Assessment & Plan     1. Presyncope/syncope: This is patient's second admission in about 6 weeks for presyncope/syncope. On prior admission, she did report loss of consciousness. Blood pressures are soft during admission. It was felt that orthostasis most likely explained her symptoms as device interrogation did not reveal any significant arrhythmias to account for her symptoms. She says that ever since then, she has had lightheadedness with standing. On July 19, she stood from a seated position and immediately felt lightheaded and says her vision seemed as though he was going black. She fell and hit the floor. She remembers falling and also remembered being on the floor. She does not think she lost consciousness. After some period of time, when she was too weak to get up on her own, she thinks she fell asleep for a period of time on the floor. Upon awakening, she used her medical alert and was treated by the fire department and subsequently taken to the emergency department. Here, she was hemodynamically stable.  Labs have been normal. Troponins negative. EKG nonacute. Blood pressures have generally been trending in the 120s. She is in sinus rhythm on telemetry only occasional  PVCs. Device interrogation is pending as morning. Based on her description of symptoms, I suspect this is ongoing orthostasis. We will likely need to allow for a little higher blood pressure at rest. She is on beta blocker, ACE inhibitor, and Lasix therapy at home. I'm going to discontinue her ACE inhibitor for the time being. She may benefit from support stockings and/or abdominal binder.  2. Nonischemic cardiomyopathy: This dates back to at least 2004. She had normal coronary arteries on catheterization at that time. She underwent ICD placement 2006 with generator change in 2011. Echocardiogram in 2017 showed an EF of 40-45% while repeat echo in June 2018 now shows an EF of 25-30% with diffuse hypokinesis. She has been conservatively managed and well compensated from a volume standpoint at home. She has been treated with beta blocker and low-dose ACE inhibitor therapy but in the setting of recurrent orthostasis, presyncope/syncope and now 2 admissions in 6 weeks, I'm going to hold her ACE inhibitor for the time being. She appears to be euvolemic on exam. Her weight appears to be relatively stable though is up slightly since admission.  3. Orthostasis: See #1. Not orthostatic by vs this am.  Holding ACE inhibitor. May require support hose and/or abdominal binder.  4. Essential hypertension: Blood pressure stable. Checking orthostatics. Next  5. Paroxysmal atrial fibrillation: Patient with brief episodes of elevated heart rates noted on interrogation in June. She only has a single lead ICD and these were presumed to be related to atrial fibrillation. A 30 day event monitor was recommended in the past and has not been carried out up to this point. We can help arrange this on the outpatient side. She is not felt to be a good candidate for oral  anticoagulation secondary to frailty and falls.   Signed, Murray Hodgkins, NP 02/09/2017, 9:08 AM   History and all data above reviewed.  Patient examined.  I agree with the findings as above.  The patient exam reveals COR:RRR  ,  Lungs:  Decreased breath sounds  ,  Abd: Positive bowel sounds, no rebound no guarding, Ext No edema  .  All available labs, radiology testing, previous records reviewed. Agree with documented assessment and plan. SYNCOPE:  Related to orthostasis.  Plan to hold the ACE inhibitor and allow permissive hypertension.  She should also get  compression stockings.  (She has help 7 days per week and could get these put on.)  Minus Breeding  11:34 AM  02/09/2017

## 2017-02-09 NOTE — Care Management Note (Signed)
Case Management Note  Patient Details  Name: Brianna [name] MRN: [id number] Date of Birth: [birth year]  Subjective/Objective:     Pt admitted for Syncope and Collapse               Action/Plan: Plan to discharge home with [facility] and family   Expected Discharge Date:   (unknown)               Expected Discharge Plan:  [place]  In-House Referral:     Discharge planning Services  CM Consult  Post Acute Care Choice:    Choice offered to:  Patient, Adult Children  DME Arranged:  [facility] DME Agency:  [facility] Arranged:  RN, NA [facility] Agency:   ([facility] in [place], [place])  Status of Service:  Completed, signed off  If discussed at [place] of Stay Meetings, dates discussed:    Additional CommentsPurcell Mouton, RN 02/09/2017, 1:02 PM

## 2017-02-09 NOTE — Evaluation (Signed)
Physical Therapy Evaluation Patient Details Name: Brianna [name] MRN: [id number] DOB: August 03, 1929 Today's Date: 02/09/2017   History of Present Illness  83 y.o. female with medical history significant of HTN, HLD, COPD (Not on Home O2 and apparently qualified for it last admission and did not receive it), NICM/Chronic Systolic and Diastolic CHF with EF of 26-33% s/p ICD, Hx of Breast Cancer s/p Chemotherapy, Hx of Syncope and Falls and other comorbids who presented to [facility] with a cc of Right Hip Pain from a fall last night and admitted for acute syncopal episode.  Clinical Impression  Pt admitted with above diagnosis. Pt currently with functional limitations due to the deficits listed below (see PT Problem List).  Pt will benefit from skilled PT to increase their independence and safety with mobility to allow discharge to the venue listed below.  Pt assisted with mobility and requiring at least min-mod assist for bed mobility at this time.  Pt also required supplemental oxygen.  Pt reports having a caregiver during the week and family assists on weekends as needed.  Recommend initial 24/7 assist upon d/c for safety.    SATURATION QUALIFICATIONS: (This note is used to comply with regulatory documentation for home oxygen)  Patient Saturations on Room Air at Rest = 98%  Patient Saturations on Room Air while Ambulating = 84%  Patient Saturations on 3 Liters of oxygen while Ambulating = 98%  Please briefly explain why patient needs home oxygen: to improve oxygen saturations above 88% during physical exertion such as ambulation.     Follow Up Recommendations Home health PT;Supervision/Assistance - 24 hour    Equipment Recommendations  Other (comment) (qualifies for supplemental oxygen)    Recommendations for Other Services       Precautions / Restrictions Precautions Precautions: Fall Precaution Comments: monitor sats      Mobility  Bed Mobility Overal bed mobility: Needs  Assistance Bed Mobility: Supine to Sit     Supine to sit: HOB elevated;Mod assist     General bed mobility comments: assist for R LE over to EOB (pt reports requiring assist for R LE at times at home) and assist for trunk upright  Transfers Overall transfer level: Needs assistance Equipment used: Rolling walker (2 wheeled) Transfers: Sit to/from Stand Sit to Stand: Min assist         General transfer comment: assist to rise and steady, improved to min/guard with second transfer  Ambulation/Gait Ambulation/Gait assistance: Min guard Ambulation Distance (Feet): 80 Feet Assistive device: Rolling walker (2 wheeled) Gait Pattern/deviations: Step-through pattern;Decreased stride length Gait velocity: decr   General Gait Details: verbal cues for posture with RW, SpO2 monitored and pt required 3L O2 [place]  Stairs            Wheelchair Mobility    Modified Rankin (Stroke Patients Only)       Balance Overall balance assessment: History of Falls                                           Pertinent Vitals/Pain Pain Assessment: 0-10 Pain Score: 5  Pain Location: R hip Pain Descriptors / Indicators: Sore Pain Intervention(s): Repositioned;Limited activity within patient's tolerance;Monitored during session (reports pain improved with mobility)    Home Living Family/patient expects to be discharged to:: Private residence Living Arrangements: Alone Available Help at Discharge: Home health;Other (Comment);Personal care attendant Type of Home: [place]  Access: Stairs to enter Entrance Stairs-Rails: Right Entrance Stairs-Number of Steps: 5 Home Layout: One level Home Equipment: Walker - 2 wheels;Bedside commode;Tub bench      Prior Function Level of Independence: Independent with assistive device(s)               Hand Dominance        Extremity/Trunk Assessment        Lower Extremity Assessment Lower Extremity Assessment: Generalized  weakness       Communication   Communication: No difficulties  Cognition Arousal/Alertness: Awake/alert Behavior During Therapy: WFL for tasks assessed/performed Overall Cognitive Status: Within Functional Limits for tasks assessed                                        General Comments      Exercises     Assessment/Plan    PT Assessment Patient needs continued PT services  PT Problem List Decreased strength;Decreased range of motion;Decreased mobility;Cardiopulmonary status limiting activity;Decreased knowledge of use of DME;Decreased activity tolerance       PT Treatment Interventions Gait training;DME instruction;Therapeutic activities;Therapeutic exercise;Functional mobility training;Patient/family education    PT Goals (Current goals can be found in the Care Plan section)  Acute Rehab PT Goals PT Goal Formulation: With patient Time For Goal Achievement: 02/16/17 Potential to Achieve Goals: Good    Frequency Min 3X/week   Barriers to discharge        Co-evaluation               AM-PAC PT "6 Clicks" Daily Activity  Outcome Measure Difficulty turning over in bed (including adjusting bedclothes, sheets and blankets)?: A Little Difficulty moving from lying on back to sitting on the side of the bed? : Total Difficulty sitting down on and standing up from a chair with arms (e.g., wheelchair, bedside commode, etc,.)?: Total Help needed moving to and from a bed to chair (including a wheelchair)?: A Little Help needed walking in hospital room?: A Little Help needed climbing 3-5 steps with a railing? : A Lot 6 Click Score: 13    End of Session Equipment Utilized During Treatment: Gait belt Activity Tolerance: Patient tolerated treatment well Patient left: in chair;with call bell/phone within reach;with chair alarm set Nurse Communication: Mobility status PT Visit Diagnosis: Other abnormalities of gait and mobility (R26.89);Muscle weakness  (generalized) (M62.81)    Time: 1655-3748 PT Time Calculation (min) (ACUTE ONLY): 22 min   Charges:   PT Evaluation $PT Eval Low Complexity: 1 Procedure     PT G Codes:   PT G-Codes **NOT FOR INPATIENT CLASS** Functional Assessment Tool Used: AM-PAC 6 Clicks Basic Mobility;Clinical judgement Functional Limitation: Mobility: Walking and moving around Mobility: Walking and Moving Around Current Status (O7078): At least 20 percent but less than 40 percent impaired, limited or restricted Mobility: Walking and Moving Around Goal Status [id number]): At least 1 percent but less than 20 percent impaired, limited or restricted    Carmelia Bake, PT, DPT 02/09/2017 Pager: [phone]   York Ram E 02/09/2017, 12:20 PM

## 2017-02-09 NOTE — Care Management Obs Status (Signed)
[place] NOTIFICATION   Patient Details  Name: Brianna [name] MRN: [id number] Date of Birth: 30-Apr-1930   Medicare Observation Status Notification Given:  Yes    McGibboneyOletta Darter, RN 02/09/2017, 1:00 PM

## 2017-02-09 NOTE — Telephone Encounter (Signed)
Order done

## 2017-02-09 NOTE — Progress Notes (Signed)
Initial Nutrition Assessment  DOCUMENTATION CODES:   Non-severe (moderate) malnutrition in context of chronic illness  INTERVENTION:   Provide Ensure Enlive po BID, each supplement provides 350 kcal and 20 grams of protein Encourage PO intake RD will continue to monitor for needs  NUTRITION DIAGNOSIS:   Malnutrition (Moderate) related to chronic illness (COPD, CHF) as evidenced by moderate depletion of body fat, moderate depletions of muscle mass, energy intake < or equal to 75% for > or equal to 1 month.  GOAL:   Patient will meet greater than or equal to 90% of their needs  MONITOR:   PO intake, Supplement acceptance, Labs, Weight trends, I & O's  REASON FOR ASSESSMENT:   Consult Assessment of nutrition requirement/status  ASSESSMENT:   83 y.o. female with medical history significant of HTN, HLD, COPD (Not on Home O2 and apparently qualified for it last admission and did not receive it), NICM/Chronic Systolic and Diastolic CHF with EF of 09-32% s/p ICD, Hx of Breast Cancer s/p Chemotherapy, Hx of Syncope and Falls and other comorbids who [facility] with a cc of Right Hip Pain from a fall last night. Patient states that when she gets up too quickly from a seated position, she feels dizzy and lightheaded. Patient states she was getting ready for bed last night when she got up and felt lightheaded and dizzy and fell. She does not recall the fall and does not know what happened but thinks she had hit her head as she had a headache. Patient does not know how long she was unconscious for but remain on the floor unable to get up until her [place] found her this morning and called EMS.  Patient in room with no family at bedside. Pt reports good appetite PTA. States she normally consumes 2 meals a day which is usually a sandwich. Pt reports not eating a lot of meat, cheese or dairy. Pt will sometimes drink an Ensure supplement at lunchtime. RD to order Ensure supplements for  patient. RD concerned that patient does not consume enough protein in her diet. Noted some missing teeth which may impact chewing.   Per chart, weight has remained stable with some gain over the past year. Nutrition-Focused physical exam completed. Findings are moderate fat depletion, moderate muscle depletion, and no edema.   Medications: OSCAL-D tablet daily, Multivitamin with minerals daily Labs reviewed: CBGs: 108-115  Diet Order:  Diet Heart Room service appropriate? Yes; Fluid consistency: Thin  Skin:  Reviewed, no issues  Last BM:  7/18  Height:   Ht Readings from Last 1 Encounters:  02/08/17 5\' 5"  (1.651 m)    Weight:   Wt Readings from Last 1 Encounters:  02/09/17 142 lb 13.7 oz (64.8 kg)    Ideal Body Weight:  56.8 kg  BMI:  Body mass index is 23.77 kg/m.  Estimated Nutritional Needs:   Kcal:  1600-1800  Protein:  70-80g  Fluid:  1.6L/day  EDUCATION NEEDS:   Education needs addressed  Clayton Bibles, MS, RD, LDN Pager: [phone] After Hours Pager: [phone]

## 2017-02-09 NOTE — Discharge Summary (Addendum)
Physician Discharge Summary  [name] EVO:[id number] DOB: 1930-03-03 DOA: 02/08/2017  PCP: Donnamae Jude, MD  Admit date: 02/08/2017 Discharge date: 02/09/2017  Admitted From: Home Disposition:  Home Health PT/OT/RN/NA  Recommendations for Outpatient Follow-up:  1. Follow up with PCP in 1-2 weeks; Have BP checked now that patient is being taken off of Enalapril  2. Folow up with Cardiology as an Outpatient in 1-2 weeks;  3. Please obtain CMP/CBC, Mag, Phos in one week 4. Repeat CXR in 3-6 weeks  Home Health: YES Equipment/Devices: O2 via [place] 3 Liters; Home Nebulizer    Discharge Condition: Stable CODE STATUS: FULL CODE Diet recommendation: Heart Healthy Diet  Brief/Interim Summary: Brianna [name] is a 83 y.o. female with medical history significant of HTN, HLD, COPD (Not on Home O2 and apparently qualified for it last admission and did not receive it), NICM/Chronic Systolic and Diastolic CHF with EF of 29-93% s/p ICD, Hx of Breast Cancer s/p Chemotherapy, Hx of Syncope and Falls and other comorbids who presented to [facility] with a cc of Right Hip Pain from a fall last night. Patient states that when she gets up too quickly from a seated position, she feels dizzy and lightheaded. Patient states she was getting ready for bed last night when she got up and felt lightheaded and dizzy and fell. She does not recall the fall and does not know what happened but thinks she had hit her head as she had a headache. Patient does not know how long she was unconscious for but remain on the floor unable to get up until her [place] found her this morning and called EMS. Patient states she was laying on her right hip where she had a recent Hip surgery. Denied CP, lightheadedness, or dizziness currently. Patient also admits to having worsening SOB with Ambulation and states she sleeps with 1-2 pillows nightly. She stated that she has not had any coughing and has wheezing at times but was not able to  elaborate. Denied any other complaints or concerns.   TRH was called to admit for Syncope and Acuter Respiratory Failure with Hypoxia. She was worked up and underwent a CTA of the Chest to r/o PE and that showed no acute findings and no PE, PNA or pulmonary Edema. It did show chronic interstitial fibrosis within both lungs, Right greater than Left and it was stable compared to prior Study. Cardiology was consulted for evaluation of her Syncope and suspected it was from orthostasis rather than an arrhythmia and interrogated her ICD. Cardiology also discontinued her ACE Inhibitor for the time being and recommended Compression stockings. She was given IVF rehydration and Orthostatics were checked and patient was not orthostatic. She was ambulated while having her pulse oximetry and qualified for Home O2. Patient was deemed medically stable at this time and will be D/C'd Home and follow up with PCP and Cardiology as an outpatient for further medication management and for 30 day Event Monitor.   Discharge Diagnoses:  Principal Problem:   Syncope and collapse Active Problems:   HYPERCHOLESTEROLEMIA   Essential hypertension   Chronic diastolic heart failure (HCC)   Osteoporosis   History of breast cancer   Automatic implantable cardioverter-defibrillator in situ   Dyspnea   NICM (nonischemic cardiomyopathy) (HCC)   Asthma, chronic   COPD (chronic obstructive pulmonary disease) (HCC)   Syncope   Hypoxia   Right hip pain   Chronic systolic CHF (congestive heart failure) (HCC)   Chronic combined systolic  and diastolic CHF (congestive heart failure) ([place])  Acute Syncopal Episode with Collapse from Orthostatisis  -Placed in Obs Telemetry -Unwitnessed Fall and unclear how long patient was on the floor but states prior to that she was dizzy; Does not remember falling but thinks she hit her head -CT Head showed No acute Intracranial Abnormality and stable moderate generalized atrophy and moderate to  severe chronic microvascular ischemic changes of the white Matter -Recent ECHO done 6/18 will not repeat; Showed Systolic function was severely reduced. The estimated ejection fraction was in the range of 25% to 30%. Diffuse hypokinesis. The study is not technically sufficient to allow evaluation of LV diastolic function. -Cycled Cardiac Troponins and were <0.03; CK was slightly elevated at [facility] for further evaluation and Management  -Per EDP AICD was interrogated; Last Cardiology Hospital Follow up showed interrogation of ICD and did not suggest arrhythmogenic cause however per record has a Hx of Ventricular Tachycardia -Checked Orthostatics this AM and yesterday and patient was not Orthostatic  -Gentle IVF Rehydration with NS at 75 mL/hr x 15 hours as patient appeared dry  -PT/OT Consultation given Fall and recommended Home Health PT -Cardiology Discontinued ACE Inhibitor and recommended Compression Stockings -Patient will need to follow up with Cardiology as an outpatient for further medication adjustment and 30 day event Monitor -Home Lasix changed to EOD  Acute Respiratory Failure with Hypoxia -Patient has coarse breath sounds with minimal rhonchi; ?Pulmonary Fibrosis  -CXR showed There is cardiomegaly without edema. Coarsening of the pulmonary interstitium throughout the right chest does not appear notably changed. Mild interstitial coarsening in the left lung apex and left base are unchanged. No consolidative process, pneumothorax or effusion. Surgical clips left axilla are noted. Aortic atherosclerosis is seen. Remote fracture of the surgical neck of the right humerus is noted. -EDP ordered CT Angio of the Chest to r/o PE and it showed No acute findings. No pulmonary embolism identified. No pneumonia or pulmonary edema. Chronic interstitial fibrosis within both lungs, right greater than left, stable compared to previous CT. -Continue with Supplemental O2  via [place] and Continuous Pulse Oximetry -Maintain O2 Saturations >92% and Wean O2 as tolerated -Patient denies any cough or productive sputum but states she gets very dyspneic with activity and ambulation.  -Per record Review patient qualified for O2 for Ambulating Saturation of 88% but no Recovery Sat of Patient on O2 was documented so O2 was not delivered and patient was not interested in obtaining O2 at that time (June 11th after last Hospital D/C) -Repeat Home O2 screen shows patient qualified for Home O2 and will write for 3 Liters of O2 via   Right Hip Pain from Fall -Right Hip Xray shows ORIF right hip. Hardware intact. Diffuse osteopenia degenerative change. No acute bony abnormality. Pelvic calcifications consistent phleboliths. Aortoiliac atherosclerotic vascular calcification . -CT of Pelvis shows No acute osseous injury of the pelvis. Healed right intertrochanteric fracture transfixed with an intramedullary nail and interlocking femoral neck screw -C/w Pain Control with OTC medications as needed -PT/OT to Evaluate and Treat and recommend Home Health  Chronic Combined Systolic and Diastolic CHF/NICM 2/2 breast Chemotherapy with ECHO showing EF of 25-30% -S/p ICD Implantation by Dr. Mitzi Davenport in March 2006 -Recent ECHO Done in June -C/w ASA, BB; ACE was discontinued by Cardiology; Not on Statin  -Monitor Volume Status carefully and hold Home Lasix 20 mg po Daily for now and restart at D/C every other day -Checked BNP and was 424.3 -Cardiac Troponin's were  Negative -Patient appeared to be dry so will start IVF with NS at 75 mL/hr x 15 hours and checked Orthostatics this AM -Patient appeared Euvolemic -Changed Lasix to 20 mg po EOD and will need to follow up with PCP and Cardiology for further adjustments  HTN -BP running on the lower side -She was on Enalapril (2.5 mg) and it was now stopped; Continue Metoprolol at D/C -Checked Orthostatics and patient was not orthostatic    HLD -Not on Statin -Per PCP Note Last Check was WNL and will need to recheck next visit when fasting   COPD  -On Advair and Proair at [place] -Patient received 1 dose of po Steroids by EDP -Wrote for [employer] and DuoNeb Treatments as needed (TIDprn) -Patient qualifies for Home O2 and will need 3 Liters of O2 via [place] -Follow up with PCP Dr. Kennon Rounds  Non-severe (Moderate) malnutrition in context of chronic illness -Nutritionist Consultation appreciated -C/w Ensure Enlive po BID, each supplement provides 350 kcal and 20 grams of protein -Encourage PO intake  Discharge Instructions  Discharge Instructions    (HEART FAILURE PATIENTS) Call MD:  Anytime you have any of the following symptoms: 1) 3 pound weight gain in 24 hours or 5 pounds in 1 week 2) shortness of breath, with or without a dry hacking cough 3) swelling in the hands, feet or stomach 4) if you have to sleep on extra pillows at night in order to breathe.    Complete by:  As directed    Call MD for:  difficulty breathing, headache or visual disturbances    Complete by:  As directed    Call MD for:  extreme fatigue    Complete by:  As directed    Call MD for:  persistant dizziness or light-headedness    Complete by:  As directed    Call MD for:  persistant nausea and vomiting    Complete by:  As directed    Call MD for:  redness, tenderness, or signs of infection (pain, swelling, redness, odor or green/yellow discharge around incision site)    Complete by:  As directed    Call MD for:  severe uncontrolled pain    Complete by:  As directed    Call MD for:  temperature >100.4    Complete by:  As directed    DME Nebulizer/meds    Complete by:  As directed    Patient needs a nebulizer to treat with the following condition:  COPD (chronic obstructive pulmonary disease) ([place])   Diet - low sodium heart healthy    Complete by:  As directed    Discharge instructions    Complete by:  As directed     Follow up with PCP and Cardiology at D/C. Take all medications as prescribed. If symptoms change or worsen please return to the ED for evaluation.   Increase activity slowly    Complete by:  As directed      Allergies as of 02/09/2017      Reactions   Atorvastatin Other (See Comments)   Reaction:  Fatigue    Claritin [loratadine] Other (See Comments)   Reaction:  Fatigue    Crestor [rosuvastatin] Other (See Comments)   Reaction:  Muscle pain       Medication List    STOP taking these medications   enalapril 2.5 MG tablet Commonly known as:  VASOTEC     TAKE these medications   ADVAIR DISKUS 100-50 MCG/DOSE Aepb Generic  drug:  Fluticasone-Salmeterol INHALE 1 PUFF INTO THE LUNGS 2 (TWO) TIMES DAILY.   albuterol 108 (90 Base) MCG/ACT inhaler Commonly known as:  PROAIR HFA Inhale 2 puffs into the lungs every 6 (six) hours as needed for wheezing or shortness of breath.   aspirin 81 MG chewable tablet Chew 81 mg by mouth daily.   CALCIUM 600+D 600-400 MG-UNIT tablet Generic drug:  Calcium Carbonate-Vitamin D Take 1 tablet by mouth daily.   feeding supplement (ENSURE ENLIVE) Liqd Take 237 mLs by mouth 2 (two) times daily between meals. What changed:  when to take this   furosemide 20 MG tablet Commonly known as:  LASIX Take 1 tablet (20 mg total) by mouth every other day. KEEP APPT What changed:  when to take this   ipratropium-albuterol 0.5-2.5 (3) MG/3ML Soln Commonly known as:  DUONEB Take 3 mLs by nebulization 3 (three) times daily.   KLOR-CON M20 20 MEQ tablet Generic drug:  potassium chloride SA TAKE 1 TABLET BY MOUTH EVERY DAY   metoprolol tartrate 25 MG tablet Commonly known as:  LOPRESSOR Take 0.5 tablets (12.5 mg total) by mouth 2 (two) times daily.   multivitamin with minerals Tabs tablet Take 1 tablet by mouth daily.   polyethylene glycol packet Commonly known as:  MIRALAX / GLYCOLAX Take 17 g by mouth daily as needed for mild constipation.    ROLLATOR Misc 1 Units by Does not apply route as needed.            Durable Medical Equipment        Start     Ordered   02/09/17 1324  DME Oxygen  Once    Question Answer Comment  Mode or (Route) Nasal cannula   Liters per Minute 3   Frequency Continuous (stationary and portable oxygen unit needed)   Oxygen conserving device Yes   Oxygen delivery system Gas      02/09/17 1328   02/09/17 0000  DME Nebulizer/meds    Question:  Patient needs a nebulizer to treat with the following condition  Answer:  COPD (chronic obstructive pulmonary disease) ([place])   02/09/17 1328     Follow-up Information    Donnamae Jude, MD. Call in 1 week(s).   Specialty:  Obstetrics and Gynecology Why:  Call to schedule an appointment within 1 week Contact information: [place] [address] [phone]        Sanda Klein, MD. Call in 1 week(s).   Specialty:  Cardiology Why:  Call to schedule a Follow Up appointment within 1 week  Contact information: [address] [place] [address] [phone]          Allergies  Allergen Reactions  . Atorvastatin Other (See Comments)    Reaction:  Fatigue   . Claritin [Loratadine] Other (See Comments)    Reaction:  Fatigue   . Crestor [Rosuvastatin] Other (See Comments)    Reaction:  Muscle pain    Consultations:  Cardiology Dr. Percival Spanish  Procedures/Studies: X-ray Chest Pa And Lateral  Result Date: 02/09/2017 CLINICAL DATA:  Short of breath and weakness for 24 hours. EXAM: CHEST  2 VIEW COMPARISON:  Yesterday FINDINGS: Right subclavian AICD device and leads are stable and intact. The heart remains mildly enlarged. Interstitial lung disease right greater than left is stable. There is volume loss in the right lung with shift of the mediastinum to the right. Low lung volumes. Prominent aorta. Pleural thickening at the lung apices is stable. Stable  deformity of the proximal right humerus. IMPRESSION:  Cardiomegaly. Stable interstitial lung disease. Electronically Signed   By: Marybelle Killings M.D.   On: 02/09/2017 09:04   Dg Chest 2 View  Result Date: 02/08/2017 CLINICAL DATA:  All shortness of breath.  Pneumonia. EXAM: CHEST  2 VIEW COMPARISON:  CT chest 12/20/2016. CT thoracic spine this same day. Plain film of the chest 12/20/2016 and 04/19/2016. FINDINGS: There is cardiomegaly without edema. Coarsening of the pulmonary interstitium throughout the right chest does not appear notably changed. Mild interstitial coarsening in the left lung apex and left base are unchanged. No consolidative process, pneumothorax or effusion. Surgical clips left axilla are noted. Aortic atherosclerosis is seen. Remote fracture of the surgical neck of the right humerus is noted. IMPRESSION: Right much worse than left pulmonary fibrosis. No acute abnormality is identified. Cardiomegaly. Atherosclerosis. Electronically Signed   By: Inge Rise M.D.   On: 02/08/2017 15:32   Ct Head Wo Contrast  Result Date: 02/08/2017 CLINICAL DATA:  Syncopal episode last night around 11:30 p.m., causing her to fall and lose consciousness for an unknown amount of time. Headache. EXAM: CT HEAD WITHOUT CONTRAST TECHNIQUE: Contiguous axial images were obtained from the base of the skull through the vertex without intravenous contrast. COMPARISON:  12/20/2016, 12/28/2015 and earlier. FINDINGS: Brain: Moderate cortical, deep and cerebellar atrophy, unchanged dating back to 2015. Moderate to severe changes of small vessel disease of the white matter, also unchanged. No mass lesion. No midline shift. No acute hemorrhage or hematoma. No extra-axial fluid collections. No evidence of acute infarction. Vascular: Severe bilateral carotid siphon atherosclerosis. Skull: No skull fracture or other focal osseous abnormality involving the skull. Sinuses/Orbits: Minimal fluid in the sphenoid sinuses and in the inferior left mastoid air cells. Remaining  visualized paranasal sinuses, bilateral mastoid air cells and bilateral middle ear cavities well-aerated. Orbits intact. Other: None. IMPRESSION: 1. No acute intracranial abnormality. 2. Stable moderate generalized atrophy and moderate to severe chronic microvascular ischemic changes of the white matter. Electronically Signed   By: Evangeline Dakin M.D.   On: 02/08/2017 12:10   Ct Angio Chest Pe W And/or Wo Contrast  Result Date: 02/08/2017 CLINICAL DATA:  Syncopal episode, found down. Right hip pain status post prior hip replacement. New back pain. Automatic defibrillator in place. EXAM: CT ANGIOGRAPHY CHEST WITH CONTRAST TECHNIQUE: Multidetector CT imaging of the chest was performed using the standard protocol during bolus administration of intravenous contrast. Multiplanar CT image reconstructions and MIPs were obtained to evaluate the vascular anatomy. CONTRAST:  100 cc Isovue 370 COMPARISON:  CTA chest dated 12/20/2016. FINDINGS: Cardiovascular: There is no pulmonary embolism identified within the main, lobar or segmental pulmonary arteries. No aortic aneurysm.  No dissection seen.  Aortic atherosclerosis. Stable cardiomegaly. No pericardial effusion. Coronary artery calcifications. Mediastinum/Nodes: No mass or enlarged lymph nodes seen within the mediastinum or perihilar regions. Esophagus is unremarkable. Trachea is unremarkable. Lungs/Pleura: Chronic interstitial fibrosis appears stable bilaterally, right greater than left. No new lung findings. No evidence of pneumonia. No pleural effusion or pneumothorax. Upper Abdomen: Limited images of the upper abdomen are unremarkable. Musculoskeletal: Stable prominent scoliotic deformity of the thoracolumbar spine. Stable compression deformity of a midthoracic vertebral body. Chronic deformity of the right humeral neck. No acute appearing fracture or displacement. Status post left mastectomy. Review of the MIP images confirms the above findings. IMPRESSION: 1. No  acute findings. No pulmonary embolism identified. No pneumonia or pulmonary edema. 2. Chronic interstitial fibrosis within both lungs, right greater  than left, stable compared to previous CT. 3. Aortic atherosclerosis. 4. Marked scoliosis of the thoracolumbar spine. Chronic compression deformity of a midthoracic vertebral body. Chronic fracture deformity of the right humeral neck. No acute appearing osseous abnormality. Aortic Atherosclerosis (ICD10-I70.0). Electronically Signed   By: Franki Cabot M.D.   On: 02/08/2017 17:50   Ct Thoracic Spine Wo Contrast  Result Date: 02/08/2017 CLINICAL DATA:  New back pain, head tenderness, the status post fall yesterday. EXAM: CT THORACIC SPINE WITHOUT CONTRAST TECHNIQUE: Multidetector CT images of the thoracic were obtained using the standard protocol without intravenous contrast. COMPARISON:  None. FINDINGS: Alignment: 3 mm anterolisthesis of C6 on C7 and C7 on T1. Severe dextroscoliosis of the thoracolumbar spine. Vertebrae: Chronic T7 vertebral body compression fracture. Remainder the vertebral body heights are maintained. No bone destruction. No aggressive lytic or sclerotic osseous lesion. Paraspinal and other soft tissues: No paraspinal abnormality. Thoracic aortic atherosclerosis. Bilateral carotid artery atherosclerosis. Right lung demonstrates severe chronic interstitial lung disease primarily involving the lung bases and lung apex. Mild chronic left lung interstitial lung disease. Cardiomegaly. Disc levels: Degenerative disc disease with disc height loss at C5-6, C6-7, T1-2, T2-3, T6-7, T10-11 and T11-12. Mild right foraminal narrowing at T1-2, T2-3, T9-10, T10-11. IMPRESSION: 1.  No acute osseous injury of the thoracic spine. 2. Thoracic spine spondylosis as described above. 3.  Aortic Atherosclerosis (ICD10-170.0) Electronically Signed   By: Kathreen Devoid   On: 02/08/2017 12:18   Ct Pelvis Wo Contrast  Result Date: 02/08/2017 CLINICAL DATA:  Status post  fall. Evaluate for hematoma. Back pain. EXAM: CT PELVIS WITHOUT CONTRAST TECHNIQUE: Multidetector CT imaging of the pelvis was performed following the standard protocol without intravenous contrast. COMPARISON:  None. FINDINGS: Urinary Tract:  No abnormality visualized. Bowel: Unremarkable visualized pelvic bowel loops. Diverticulosis of the sigmoid colon without evidence of diverticulitis. Vascular/Lymphatic: Abdominal aortic atherosclerosis. No lymphadenopathy. Reproductive:  No mass or other significant abnormality Other:  None. Musculoskeletal: Prior ORIF of a healed right intertrochanteric fracture. No hardware failure or complication. No acute fracture or dislocation. Degenerative disc disease with disc height loss at L4-5 and L5-S1. Severe right facet arthropathy at L4-5 and L5-S1. IMPRESSION: 1.  No acute osseous injury of the pelvis. 2. Healed right intertrochanteric fracture transfixed with an intramedullary nail and interlocking femoral neck screw. Electronically Signed   By: Kathreen Devoid   On: 02/08/2017 12:12   Dg Hip Unilat W Or Wo Pelvis 2-3 Views Right  Result Date: 02/08/2017 CLINICAL DATA:  Fall.  Pain. EXAM: DG HIP (WITH OR WITHOUT PELVIS) 2-3V RIGHT COMPARISON:  No recent prior. FINDINGS: ORIF right hip. Hardware intact. Diffuse osteopenia degenerative change. No acute bony abnormality. Pelvic calcifications consistent phleboliths. Aortoiliac atherosclerotic vascular calcification . IMPRESSION: 1. ORIF right hip.  Hardware intact. No acute bony abnormality. 2.  Aortoiliac atherosclerotic vascular disease. Electronically Signed   By: Marcello Moores  Register   On: 02/08/2017 09:29     Subjective: Seen and examined and was doing well this AM. Walked with PT and qualified for Home O2. No lightheadness or dizziness. No CP or SOB. No other complaints or concerns and was ready to go home.   Discharge Exam: Vitals:   02/09/17 0447 02/09/17 1011  BP: 121/66 124/68  Pulse: 64 67  Resp: 18 18   Temp: 98.3 F (36.8 C) 98 F (36.7 C)   Vitals:   02/08/17 2013 02/09/17 0447 02/09/17 0804 02/09/17 1011  BP: 128/61 121/66  124/68  Pulse: 81 64  67  Resp: 16 18  18   Temp: 98 F (36.7 C) 98.3 F (36.8 C)  98 F (36.7 C)  TempSrc: Oral Oral  Oral  SpO2: 99% 99% 96% 98%  Weight: 63.4 kg (139 lb 12.4 oz) 64.8 kg (142 lb 13.7 oz)    Height: 5\' 5"  (1.651 m)      General: Pt is alert, awake, not in acute distress Cardiovascular: RRR, S1/S2 +, no rubs, no gallops Respiratory: Diminished bilaterally with coarse breath sounds, no wheezing, no rhonchi Abdominal: Soft, NT, ND, bowel sounds + Extremities: no edema, no cyanosis  The results of significant diagnostics from this hospitalization (including imaging, microbiology, ancillary and laboratory) are listed below for reference.    Microbiology: No results found for this or any previous visit (from the past 240 hour(s)).   Labs: BNP (last 3 results)  Recent Labs  12/20/16 2053 12/24/16 0904 02/08/17 1114  BNP 833.8* 417.7* 833.8*   Basic Metabolic Panel:  Recent Labs Lab 02/08/17 1114 02/08/17 2011 02/09/17 0135  NA 138  --  137  K 4.3  --  4.2  CL 101  --  101  CO2 26  --  25  GLUCOSE 102*  --  129*  BUN 22*  --  21*  CREATININE 0.83 0.91 0.93  CALCIUM 8.9  --  8.7*   Liver Function Tests:  Recent Labs Lab 02/08/17 1114 02/09/17 0135  AST 32 32  ALT 18 16  ALKPHOS 73 65  BILITOT 0.7 0.8  PROT 7.8 7.4  ALBUMIN 4.1 3.6   No results for input(s): LIPASE, AMYLASE in the last 168 hours. No results for input(s): AMMONIA in the last 168 hours. CBC:  Recent Labs Lab 02/08/17 1114 02/08/17 2011 02/09/17 0135  WBC 8.6 9.2 6.1  NEUTROABS 7.2  --   --   HGB 12.8 13.0 13.3  HCT 38.4 38.8 38.6  MCV 91.9 92.6 91.9  PLT 167 155 153   Cardiac Enzymes:  Recent Labs Lab 02/08/17 1114 02/08/17 2011 02/09/17 0135 02/09/17 0732  CKTOTAL 264*  --   --   --   CKMB 3.3  --   --   --   TROPONINI <0.03  <0.03 <0.03 <0.03   BNP: Invalid input(s): POCBNP CBG:  Recent Labs Lab 02/09/17 0458 02/09/17 0750  GLUCAP 115* 108*   D-Dimer No results for input(s): DDIMER in the last 72 hours. Hgb A1c No results for input(s): HGBA1C in the last 72 hours. Lipid Profile No results for input(s): CHOL, HDL, LDLCALC, TRIG, CHOLHDL, LDLDIRECT in the last 72 hours. Thyroid function studies No results for input(s): TSH, T4TOTAL, T3FREE, THYROIDAB in the last 72 hours.  Invalid input(s): FREET3 Anemia work up No results for input(s): VITAMINB12, FOLATE, FERRITIN, TIBC, IRON, RETICCTPCT in the last 72 hours. Urinalysis    Component Value Date/Time   COLORURINE YELLOW 02/08/2017 1430   APPEARANCEUR CLEAR 02/08/2017 1430   LABSPEC 1.014 02/08/2017 1430   PHURINE 6.0 02/08/2017 1430   GLUCOSEU NEGATIVE 02/08/2017 1430   HGBUR NEGATIVE 02/08/2017 1430   BILIRUBINUR NEGATIVE 02/08/2017 1430   BILIRUBINUR NEG 10/26/2010 1435   KETONESUR NEGATIVE 02/08/2017 1430   PROTEINUR NEGATIVE 02/08/2017 1430   UROBILINOGEN 0.2 10/26/2010 1435   UROBILINOGEN 0.2 06/15/2010 2135   NITRITE NEGATIVE 02/08/2017 1430   LEUKOCYTESUR NEGATIVE 02/08/2017 1430   Sepsis Labs Invalid input(s): PROCALCITONIN,  WBC,  LACTICIDVEN Microbiology No results found for this or any previous visit (from the past 240 hour(s)).  Time coordinating discharge:  35 minutes  SIGNED:  Kerney Elbe, DO [facility] 02/09/2017, 1:32 PM Pager [phone]  If 7PM-7AM, please contact night-coverage [email or web address] Password [email or web address]

## 2017-02-09 NOTE — Progress Notes (Addendum)
SATURATION QUALIFICATIONS: (This note is used to comply with regulatory documentation for home oxygen)  Patient Saturations on Room Air at Rest =90 %  Patient Saturations on Room Air while Ambulating = 85%  Patient Saturations on 3 Liters of oxygen while Ambulating = 98%  Please briefly explain why patient needs home oxygen: 02 drops below 88% during ambulation  Alternate treatments were attempted but insufficient.

## 2017-02-09 NOTE — Telephone Encounter (Signed)
Patient is currently inpatient for syncope and is going to be evaluated by OT/PT and they will get an order for this walker if she does need it.  I was unable to find the order written by Dr. Nehemiah Settle. Jazmin Hartsell,CMA

## 2017-02-10 ENCOUNTER — Telehealth: Payer: Self-pay | Admitting: *Deleted

## 2017-02-10 DIAGNOSIS — I5032 Chronic diastolic (congestive) heart failure: Secondary | ICD-10-CM | POA: Diagnosis not present

## 2017-02-10 DIAGNOSIS — I110 Hypertensive heart disease with heart failure: Secondary | ICD-10-CM | POA: Diagnosis not present

## 2017-02-10 DIAGNOSIS — R261 Paralytic gait: Secondary | ICD-10-CM | POA: Diagnosis not present

## 2017-02-10 DIAGNOSIS — F329 Major depressive disorder, single episode, unspecified: Secondary | ICD-10-CM | POA: Diagnosis not present

## 2017-02-10 DIAGNOSIS — M810 Age-related osteoporosis without current pathological fracture: Secondary | ICD-10-CM | POA: Diagnosis not present

## 2017-02-10 DIAGNOSIS — J449 Chronic obstructive pulmonary disease, unspecified: Secondary | ICD-10-CM | POA: Diagnosis not present

## 2017-02-10 NOTE — Telephone Encounter (Signed)
Reviewed record and found that pt had been set up with [facility] for DME Oxygen. Called [place], who is the rep for that agency here today. He will check on the status of this pt, call the pt and make me aware of any further CM needs.

## 2017-02-12 ENCOUNTER — Telehealth: Payer: Self-pay | Admitting: Family Medicine

## 2017-02-12 DIAGNOSIS — I110 Hypertensive heart disease with heart failure: Secondary | ICD-10-CM | POA: Diagnosis not present

## 2017-02-12 DIAGNOSIS — I5032 Chronic diastolic (congestive) heart failure: Secondary | ICD-10-CM | POA: Diagnosis not present

## 2017-02-12 DIAGNOSIS — R261 Paralytic gait: Secondary | ICD-10-CM | POA: Diagnosis not present

## 2017-02-12 DIAGNOSIS — M810 Age-related osteoporosis without current pathological fracture: Secondary | ICD-10-CM | POA: Diagnosis not present

## 2017-02-12 DIAGNOSIS — J449 Chronic obstructive pulmonary disease, unspecified: Secondary | ICD-10-CM | POA: Diagnosis not present

## 2017-02-12 DIAGNOSIS — F329 Major depressive disorder, single episode, unspecified: Secondary | ICD-10-CM | POA: Diagnosis not present

## 2017-02-12 NOTE — Telephone Encounter (Signed)
Pete from [facility] need to speak to Dr.Pratt regarding this patient

## 2017-02-13 LAB — CUP PACEART REMOTE DEVICE CHECK
Battery Voltage: 2.79 V
Brady Statistic RV Percent Paced: 0.01 %
Date Time Interrogation Session: 20180628145936
HIGH POWER IMPEDANCE MEASURED VALUE: 304 Ohm
HIGH POWER IMPEDANCE MEASURED VALUE: 79 Ohm
Implantable Lead Location: 753860
Implantable Pulse Generator Implant Date: 20111125
Lead Channel Impedance Value: 399 Ohm
Lead Channel Setting Pacing Amplitude: 2 V
MDC IDC LEAD IMPLANT DT: 20111125
MDC IDC MSMT LEADCHNL RV SENSING INTR AMPL: 10.25 mV
MDC IDC MSMT LEADCHNL RV SENSING INTR AMPL: 10.25 mV
MDC IDC SET LEADCHNL RV PACING PULSEWIDTH: 0.4 ms
MDC IDC SET LEADCHNL RV SENSING SENSITIVITY: 0.45 mV

## 2017-02-15 DIAGNOSIS — F329 Major depressive disorder, single episode, unspecified: Secondary | ICD-10-CM | POA: Diagnosis not present

## 2017-02-15 DIAGNOSIS — R261 Paralytic gait: Secondary | ICD-10-CM | POA: Diagnosis not present

## 2017-02-15 DIAGNOSIS — I5032 Chronic diastolic (congestive) heart failure: Secondary | ICD-10-CM | POA: Diagnosis not present

## 2017-02-15 DIAGNOSIS — I110 Hypertensive heart disease with heart failure: Secondary | ICD-10-CM | POA: Diagnosis not present

## 2017-02-15 DIAGNOSIS — J449 Chronic obstructive pulmonary disease, unspecified: Secondary | ICD-10-CM | POA: Diagnosis not present

## 2017-02-15 DIAGNOSIS — M810 Age-related osteoporosis without current pathological fracture: Secondary | ICD-10-CM | POA: Diagnosis not present

## 2017-02-18 ENCOUNTER — Other Ambulatory Visit: Payer: Self-pay | Admitting: Family Medicine

## 2017-02-20 DIAGNOSIS — I5032 Chronic diastolic (congestive) heart failure: Secondary | ICD-10-CM | POA: Diagnosis not present

## 2017-02-20 DIAGNOSIS — J449 Chronic obstructive pulmonary disease, unspecified: Secondary | ICD-10-CM | POA: Diagnosis not present

## 2017-02-20 DIAGNOSIS — M810 Age-related osteoporosis without current pathological fracture: Secondary | ICD-10-CM | POA: Diagnosis not present

## 2017-02-20 DIAGNOSIS — I110 Hypertensive heart disease with heart failure: Secondary | ICD-10-CM | POA: Diagnosis not present

## 2017-02-20 DIAGNOSIS — F329 Major depressive disorder, single episode, unspecified: Secondary | ICD-10-CM | POA: Diagnosis not present

## 2017-02-20 DIAGNOSIS — R261 Paralytic gait: Secondary | ICD-10-CM | POA: Diagnosis not present

## 2017-02-21 ENCOUNTER — Ambulatory Visit (INDEPENDENT_AMBULATORY_CARE_PROVIDER_SITE_OTHER): Payer: Medicare Other | Admitting: Family Medicine

## 2017-02-21 ENCOUNTER — Encounter: Payer: Self-pay | Admitting: Family Medicine

## 2017-02-21 DIAGNOSIS — F329 Major depressive disorder, single episode, unspecified: Secondary | ICD-10-CM | POA: Diagnosis not present

## 2017-02-21 DIAGNOSIS — R55 Syncope and collapse: Secondary | ICD-10-CM | POA: Diagnosis not present

## 2017-02-21 DIAGNOSIS — J449 Chronic obstructive pulmonary disease, unspecified: Secondary | ICD-10-CM | POA: Diagnosis not present

## 2017-02-21 DIAGNOSIS — M810 Age-related osteoporosis without current pathological fracture: Secondary | ICD-10-CM | POA: Diagnosis not present

## 2017-02-21 DIAGNOSIS — I10 Essential (primary) hypertension: Secondary | ICD-10-CM

## 2017-02-21 DIAGNOSIS — M25551 Pain in right hip: Secondary | ICD-10-CM

## 2017-02-21 DIAGNOSIS — I110 Hypertensive heart disease with heart failure: Secondary | ICD-10-CM | POA: Diagnosis not present

## 2017-02-21 DIAGNOSIS — R261 Paralytic gait: Secondary | ICD-10-CM | POA: Diagnosis not present

## 2017-02-21 DIAGNOSIS — I5032 Chronic diastolic (congestive) heart failure: Secondary | ICD-10-CM | POA: Diagnosis not present

## 2017-02-21 NOTE — Progress Notes (Signed)
   Subjective:    Patient ID: Brianna [name] is a 83 y.o. female presenting with No chief complaint on file.  on 02/21/2017  HPI: Here for hospital f/u. Admitted following syncope again at end of July. Second hospitalization for same in 3 months. No known cause. Now with 24 hr in home care-giver. Using wheel chair and home oxygen at 2.5 L/minute. She is no longer driving. Has EF of 25-30% and some fibrotic lung changes.  Review of Systems  Constitutional: Negative for chills and fever.  Respiratory: Negative for shortness of breath.   Cardiovascular: Negative for chest pain.  Gastrointestinal: Negative for abdominal pain, nausea and vomiting.  Genitourinary: Negative for dysuria.  Skin: Negative for rash.      Objective:    BP 114/62   Temp 98.1 F (36.7 C) (Oral)   Ht 5\' 2"  (1.575 m)   Wt 137 lb (62.1 kg)   BMI 25.06 kg/m  O2 sats 95% on room air today Physical Exam  Constitutional: She is oriented to person, place, and time. No distress.  Elderly female  HENT:  Head: Normocephalic and atraumatic.  Eyes: No scleral icterus.  Neck: Neck supple.  Cardiovascular: Normal rate.   Pulmonary/Chest: Effort normal.  Abdominal: Soft.  Neurological: She is alert and oriented to person, place, and time.  Skin: Skin is warm and dry.  Psychiatric: She has a normal mood and affect.        Assessment & Plan:   Problem List Items Addressed This Visit      Unprioritized   Essential hypertension    BP ok aff ACE-I may continue to leave it off.      Syncope    Risks discussed at length. No longer driving. Has home PT to help with stamina and strength. Continue home O2. Not to be left alone.      Right hip pain    Pain continues--may use tylenol as needed         Total face-to-face time with patient: 25 minutes. Over 50% of encounter was spent on counseling and coordination of care. Return in about 4 weeks (around 03/21/2017) for a follow-up.  Donnamae Jude 02/21/2017 1:47 PM

## 2017-02-21 NOTE — Assessment & Plan Note (Signed)
Risks discussed at length. No longer driving. Has home PT to help with stamina and strength. Continue home O2. Not to be left alone.

## 2017-02-21 NOTE — Assessment & Plan Note (Signed)
Pain continues--may use tylenol as needed

## 2017-02-21 NOTE — Assessment & Plan Note (Signed)
BP ok aff ACE-I may continue to leave it off.

## 2017-02-21 NOTE — Patient Instructions (Signed)

## 2017-02-22 DIAGNOSIS — F329 Major depressive disorder, single episode, unspecified: Secondary | ICD-10-CM | POA: Diagnosis not present

## 2017-02-22 DIAGNOSIS — J449 Chronic obstructive pulmonary disease, unspecified: Secondary | ICD-10-CM | POA: Diagnosis not present

## 2017-02-22 DIAGNOSIS — M810 Age-related osteoporosis without current pathological fracture: Secondary | ICD-10-CM | POA: Diagnosis not present

## 2017-02-22 DIAGNOSIS — I110 Hypertensive heart disease with heart failure: Secondary | ICD-10-CM | POA: Diagnosis not present

## 2017-02-22 DIAGNOSIS — R261 Paralytic gait: Secondary | ICD-10-CM | POA: Diagnosis not present

## 2017-02-22 DIAGNOSIS — I5032 Chronic diastolic (congestive) heart failure: Secondary | ICD-10-CM | POA: Diagnosis not present

## 2017-02-23 DIAGNOSIS — F329 Major depressive disorder, single episode, unspecified: Secondary | ICD-10-CM | POA: Diagnosis not present

## 2017-02-23 DIAGNOSIS — M810 Age-related osteoporosis without current pathological fracture: Secondary | ICD-10-CM | POA: Diagnosis not present

## 2017-02-23 DIAGNOSIS — R261 Paralytic gait: Secondary | ICD-10-CM | POA: Diagnosis not present

## 2017-02-23 DIAGNOSIS — J449 Chronic obstructive pulmonary disease, unspecified: Secondary | ICD-10-CM | POA: Diagnosis not present

## 2017-02-23 DIAGNOSIS — I110 Hypertensive heart disease with heart failure: Secondary | ICD-10-CM | POA: Diagnosis not present

## 2017-02-23 DIAGNOSIS — I5032 Chronic diastolic (congestive) heart failure: Secondary | ICD-10-CM | POA: Diagnosis not present

## 2017-02-28 DIAGNOSIS — J449 Chronic obstructive pulmonary disease, unspecified: Secondary | ICD-10-CM | POA: Diagnosis not present

## 2017-02-28 DIAGNOSIS — I110 Hypertensive heart disease with heart failure: Secondary | ICD-10-CM | POA: Diagnosis not present

## 2017-02-28 DIAGNOSIS — I5032 Chronic diastolic (congestive) heart failure: Secondary | ICD-10-CM | POA: Diagnosis not present

## 2017-02-28 DIAGNOSIS — R261 Paralytic gait: Secondary | ICD-10-CM | POA: Diagnosis not present

## 2017-02-28 DIAGNOSIS — F329 Major depressive disorder, single episode, unspecified: Secondary | ICD-10-CM | POA: Diagnosis not present

## 2017-02-28 DIAGNOSIS — M810 Age-related osteoporosis without current pathological fracture: Secondary | ICD-10-CM | POA: Diagnosis not present

## 2017-03-01 DIAGNOSIS — I110 Hypertensive heart disease with heart failure: Secondary | ICD-10-CM | POA: Diagnosis not present

## 2017-03-01 DIAGNOSIS — J449 Chronic obstructive pulmonary disease, unspecified: Secondary | ICD-10-CM | POA: Diagnosis not present

## 2017-03-01 DIAGNOSIS — R261 Paralytic gait: Secondary | ICD-10-CM | POA: Diagnosis not present

## 2017-03-01 DIAGNOSIS — I5032 Chronic diastolic (congestive) heart failure: Secondary | ICD-10-CM | POA: Diagnosis not present

## 2017-03-01 DIAGNOSIS — F329 Major depressive disorder, single episode, unspecified: Secondary | ICD-10-CM | POA: Diagnosis not present

## 2017-03-01 DIAGNOSIS — M810 Age-related osteoporosis without current pathological fracture: Secondary | ICD-10-CM | POA: Diagnosis not present

## 2017-03-06 DIAGNOSIS — R261 Paralytic gait: Secondary | ICD-10-CM | POA: Diagnosis not present

## 2017-03-06 DIAGNOSIS — I5032 Chronic diastolic (congestive) heart failure: Secondary | ICD-10-CM | POA: Diagnosis not present

## 2017-03-06 DIAGNOSIS — I110 Hypertensive heart disease with heart failure: Secondary | ICD-10-CM | POA: Diagnosis not present

## 2017-03-06 DIAGNOSIS — F329 Major depressive disorder, single episode, unspecified: Secondary | ICD-10-CM | POA: Diagnosis not present

## 2017-03-06 DIAGNOSIS — J449 Chronic obstructive pulmonary disease, unspecified: Secondary | ICD-10-CM | POA: Diagnosis not present

## 2017-03-06 DIAGNOSIS — M810 Age-related osteoporosis without current pathological fracture: Secondary | ICD-10-CM | POA: Diagnosis not present

## 2017-03-08 DIAGNOSIS — M810 Age-related osteoporosis without current pathological fracture: Secondary | ICD-10-CM | POA: Diagnosis not present

## 2017-03-08 DIAGNOSIS — J449 Chronic obstructive pulmonary disease, unspecified: Secondary | ICD-10-CM | POA: Diagnosis not present

## 2017-03-08 DIAGNOSIS — I5032 Chronic diastolic (congestive) heart failure: Secondary | ICD-10-CM | POA: Diagnosis not present

## 2017-03-08 DIAGNOSIS — F329 Major depressive disorder, single episode, unspecified: Secondary | ICD-10-CM | POA: Diagnosis not present

## 2017-03-08 DIAGNOSIS — R261 Paralytic gait: Secondary | ICD-10-CM | POA: Diagnosis not present

## 2017-03-08 DIAGNOSIS — I110 Hypertensive heart disease with heart failure: Secondary | ICD-10-CM | POA: Diagnosis not present

## 2017-03-12 DIAGNOSIS — R261 Paralytic gait: Secondary | ICD-10-CM | POA: Diagnosis not present

## 2017-03-12 DIAGNOSIS — J449 Chronic obstructive pulmonary disease, unspecified: Secondary | ICD-10-CM | POA: Diagnosis not present

## 2017-03-12 DIAGNOSIS — I5032 Chronic diastolic (congestive) heart failure: Secondary | ICD-10-CM | POA: Diagnosis not present

## 2017-03-12 DIAGNOSIS — F329 Major depressive disorder, single episode, unspecified: Secondary | ICD-10-CM | POA: Diagnosis not present

## 2017-03-12 DIAGNOSIS — M810 Age-related osteoporosis without current pathological fracture: Secondary | ICD-10-CM | POA: Diagnosis not present

## 2017-03-12 DIAGNOSIS — I110 Hypertensive heart disease with heart failure: Secondary | ICD-10-CM | POA: Diagnosis not present

## 2017-03-14 DIAGNOSIS — R261 Paralytic gait: Secondary | ICD-10-CM | POA: Diagnosis not present

## 2017-03-14 DIAGNOSIS — J449 Chronic obstructive pulmonary disease, unspecified: Secondary | ICD-10-CM | POA: Diagnosis not present

## 2017-03-14 DIAGNOSIS — F329 Major depressive disorder, single episode, unspecified: Secondary | ICD-10-CM | POA: Diagnosis not present

## 2017-03-14 DIAGNOSIS — I5032 Chronic diastolic (congestive) heart failure: Secondary | ICD-10-CM | POA: Diagnosis not present

## 2017-03-14 DIAGNOSIS — I110 Hypertensive heart disease with heart failure: Secondary | ICD-10-CM | POA: Diagnosis not present

## 2017-03-14 DIAGNOSIS — M810 Age-related osteoporosis without current pathological fracture: Secondary | ICD-10-CM | POA: Diagnosis not present

## 2017-03-20 ENCOUNTER — Other Ambulatory Visit: Payer: Self-pay | Admitting: Family Medicine

## 2017-03-20 DIAGNOSIS — I5032 Chronic diastolic (congestive) heart failure: Secondary | ICD-10-CM | POA: Diagnosis not present

## 2017-03-20 DIAGNOSIS — M810 Age-related osteoporosis without current pathological fracture: Secondary | ICD-10-CM | POA: Diagnosis not present

## 2017-03-20 DIAGNOSIS — J449 Chronic obstructive pulmonary disease, unspecified: Secondary | ICD-10-CM | POA: Diagnosis not present

## 2017-03-20 DIAGNOSIS — F329 Major depressive disorder, single episode, unspecified: Secondary | ICD-10-CM | POA: Diagnosis not present

## 2017-03-20 DIAGNOSIS — I110 Hypertensive heart disease with heart failure: Secondary | ICD-10-CM | POA: Diagnosis not present

## 2017-03-20 DIAGNOSIS — R261 Paralytic gait: Secondary | ICD-10-CM | POA: Diagnosis not present

## 2017-03-22 DIAGNOSIS — M810 Age-related osteoporosis without current pathological fracture: Secondary | ICD-10-CM | POA: Diagnosis not present

## 2017-03-22 DIAGNOSIS — I110 Hypertensive heart disease with heart failure: Secondary | ICD-10-CM | POA: Diagnosis not present

## 2017-03-22 DIAGNOSIS — J449 Chronic obstructive pulmonary disease, unspecified: Secondary | ICD-10-CM | POA: Diagnosis not present

## 2017-03-22 DIAGNOSIS — F329 Major depressive disorder, single episode, unspecified: Secondary | ICD-10-CM | POA: Diagnosis not present

## 2017-03-22 DIAGNOSIS — I5032 Chronic diastolic (congestive) heart failure: Secondary | ICD-10-CM | POA: Diagnosis not present

## 2017-03-22 DIAGNOSIS — R261 Paralytic gait: Secondary | ICD-10-CM | POA: Diagnosis not present

## 2017-03-28 ENCOUNTER — Ambulatory Visit (INDEPENDENT_AMBULATORY_CARE_PROVIDER_SITE_OTHER): Payer: Medicare Other | Admitting: Cardiovascular Disease

## 2017-03-28 ENCOUNTER — Encounter: Payer: Self-pay | Admitting: Cardiovascular Disease

## 2017-03-28 VITALS — BP 134/81 | HR 90 | Ht 62.0 in | Wt 144.0 lb

## 2017-03-28 DIAGNOSIS — I428 Other cardiomyopathies: Secondary | ICD-10-CM

## 2017-03-28 DIAGNOSIS — I5022 Chronic systolic (congestive) heart failure: Secondary | ICD-10-CM | POA: Diagnosis not present

## 2017-03-28 DIAGNOSIS — I472 Ventricular tachycardia: Secondary | ICD-10-CM

## 2017-03-28 DIAGNOSIS — I4729 Other ventricular tachycardia: Secondary | ICD-10-CM

## 2017-03-28 DIAGNOSIS — I447 Left bundle-branch block, unspecified: Secondary | ICD-10-CM | POA: Diagnosis not present

## 2017-03-28 DIAGNOSIS — Z95810 Presence of automatic (implantable) cardiac defibrillator: Secondary | ICD-10-CM | POA: Diagnosis not present

## 2017-03-28 DIAGNOSIS — R55 Syncope and collapse: Secondary | ICD-10-CM

## 2017-03-28 MED ORDER — FUROSEMIDE 20 MG PO TABS: ORAL_TABLET | ORAL | 3 refills | Status: AC

## 2017-03-28 MED ORDER — FUROSEMIDE 20 MG PO TABS: 20.0000 mg | ORAL_TABLET | ORAL | 3 refills | Status: DC

## 2017-03-28 NOTE — Progress Notes (Signed)
Cardiology Office Note    Date:  03/28/2017   ID:  Brianna [name], DOB [birth year], MRN [id number]  PCP:  Donnamae Jude, MD  Cardiologist: Quay Burow, M.D.;  Sanda Klein, MD   Chief Complaint  Patient presents with  . Follow-up    DOE    History of Present Illness:  Brianna [name] is a 83 y.o. female returning in follow-up for her single chamber Medtronic protective VR defibrillator. She has nonischemic cardiomyopathy secondary to breast cancer chemotherapy >10 years ago. Her most recent echo showed an ejection fraction of 25-30%. She has a chronic left bundle branch block and septal dyssynchrony.   She last saw Dr. Gwenlyn Found in June. Just before, on May 30 she had been hospitalized after a fall, secondary to dizziness without full blown syncope. Thankfully this time she did not have any serious injuries. A year earlier she had shoulder fracture after a similar event. She was monitored in the hospital briefly due to problems with hypoxemia. Repeat echo showed ejection fraction of 25-30%. She has a single chamber defibrillator, has never received tachycardia therapies, has never had documented ventricular tachycardia or need for ventricular pacing. She had another similar event on July 19. Her ECGs on May 30, June 22 and on July 19 showed sinus rhythm with old left bundle branch block. There was no evidence of bradycardia, pauses or prolonged AV conduction time.  She reports permanently quitting smoking 3-4 weeks ago. She still craves cigarettes and sometimes tries to cajole her nurse aide to by some for her, unsuccessfully.  She has NYHA functional class II-III dyspnea chronically. She has chronic fibrotic changes on chest x-ray and has persistent crackles in the base of her lungs, especially on the right side.  Interrogation of her defibrillator shows normal function. She has not had any episodes of arrhythmia and does not require ventricular pacing. Battery voltage 2.76 V (ERI 2.63  V).  She has a history of nonischemic cardiomyopathy possibly related to previous chemotherapy for left breast cancer. Her left ventricular ejection fraction was as low as 30% when she received her single chamber defibrillator in 2006, was 45-50% by echo in June 2014, 25-30% in June 2018, . She has a history of a Medtronic Sprint fidelis lead fracture and now has a Chief Operating Officer. Her defibrillator is a Artist device implanted in 2011. She has never received tachycardia therapies from her device.  Past Medical History:  Diagnosis Date  . AICD (automatic cardioverter/defibrillator) present    a. 05/2010 MDT D33VRG Protecta VR single lead AICD (ser # [id number]).  Marland Kitchen Anxiety   . Arthritis    "lower back; knees" (12/20/2016)  . Cancer of left breast ([place])   . Depression   . Emphysema of lung ([facility])   . Falls frequently    12/20/2016 "couple falls since 07/2016"  . Heart murmur   . HFrEF (heart failure with reduced ejection fraction) ([place])    a. 12/2015 Echo: EF 40-45%;  b. 12/2016 Echo: EF 25-30%, diff HK.  Marland Kitchen Hyperlipidemia   . Hypertension   . Nonischemic cardiomyopathy ([place])    a. Felt to be 2/2 ChemoRx for Breast CA;  b. 2006 s/p AICD;  c. 05/2010 ICD upgrade: MDT D33VRG Protecta VR single lead AICD (ser # [id number]);  d. 12/2015 Echo: EF 40-45%;  e. 12/2016 Echo: EF 25-30%, diff HK, mild MR, PASP 48mmHg.  . Osteopenia   . Paroxysmal atrial fibrillation ([place])    a. Previously noted  on device interrogation for brief periods. No OAC 2/2 falls/frailty.  . Pneumonia ~ 5784-6962 X 3    Past Surgical History:  Procedure Laterality Date  . APPENDECTOMY  1943  . BREAST BIOPSY Left  [id number]  . CARDIAC CATHETERIZATION  12/18/2002   Normal coronary arteries and normal LV function  . CARDIAC DEFIBRILLATOR PLACEMENT  10/13/2004   Medtronic Maximo VR, model [id number], serial [id number]  . CATARACT EXTRACTION W/ INTRAOCULAR LENS  IMPLANT, BILATERAL    . FEMUR IM NAIL Right 05/10/2014    Procedure: INTRAMEDULLARY (IM) NAIL FEMORAL;  Surgeon: Marianna Payment, MD;  Location: [facility];  Service: Orthopedics;  Laterality: Right;  . FRACTURE SURGERY    . MASTECTOMY Left  [id number]  . PACEMAKER INSERTION  06/17/2010   Medtronic Protecta VR, model #D334VRG, serial [id number]  . TONSILLECTOMY  1940  . TUBAL LIGATION      Current Medications: Outpatient Medications Prior to Visit  Medication Sig Dispense Refill  . ADVAIR DISKUS 100-50 MCG/DOSE AEPB INHALE 1 PUFF INTO THE LUNGS 2 (TWO) TIMES DAILY. 60 each 1  . albuterol (PROAIR HFA) 108 (90 Base) MCG/ACT inhaler Inhale 2 puffs into the lungs every 6 (six) hours as needed for wheezing or shortness of breath. 8 g 2  . aspirin 81 MG chewable tablet Chew 81 mg by mouth daily.    . Calcium Carbonate-Vitamin D (CALCIUM 600+D) 600-400 MG-UNIT tablet Take 1 tablet by mouth daily.    . feeding supplement, ENSURE ENLIVE, (ENSURE ENLIVE) LIQD Take 237 mLs by mouth 2 (two) times daily between meals. 237 mL 12  . ipratropium-albuterol (DUONEB) 0.5-2.5 (3) MG/3ML SOLN Take 3 mLs by nebulization 3 (three) times daily. 360 mL 0  . KLOR-CON M20 20 MEQ tablet TAKE 1 TABLET BY MOUTH EVERY DAY 30 tablet 0  . metoprolol tartrate (LOPRESSOR) 25 MG tablet Take 0.5 tablets (12.5 mg total) by mouth 2 (two) times daily. 60 tablet 0  . Misc. Devices (ROLLATOR) MISC 1 Units by Does not apply route as needed. 1 each 0  . Multiple Vitamin (MULTIVITAMIN WITH MINERALS) TABS tablet Take 1 tablet by mouth daily.    . polyethylene glycol (MIRALAX / GLYCOLAX) packet Take 17 g by mouth daily as needed for mild constipation. 14 each 0  . furosemide (LASIX) 20 MG tablet Take 1 tablet (20 mg total) by mouth every other day. KEEP APPT 90 tablet 0   No facility-administered medications prior to visit.      Allergies:   Atorvastatin; Claritin [loratadine]; and Crestor [rosuvastatin]   Social History   Social History  . Marital status: Widowed    Spouse name: N/A  .  Number of children: 2  . Years of education: 11   Occupational History  . Retired-Manager Writer. Retired   Social History Main Topics  . Smoking status: Light Tobacco Smoker    Packs/day: 0.10    Years: 44.00    Types: Cigarettes  . Smokeless tobacco: Never Used  . Alcohol use 3.0 oz/week    5 Glasses of wine per week  . Drug use: No  . Sexual activity: No   Other Topics Concern  . None   Social History Narrative   Health Care POA:    Emergency Contact: friend, Trevor Iha, [phone]   End of Life Plan:    Who lives with you: self, has medical alert button    Any pets: none   Diet: Patient has a varied diet and controls portions well.  Exercise: Patient does not have any regular exercise routine.   Seatbelts: Patient reports wearing seatbelt when in vehicle.   Nancy Fetter Exposure/Protection: Patient reports wearing sun screening daily.   Hobbies: clothes alterations                 Family History:  The patient's family history includes Cancer in her daughter; Heart disease in her brother, father, and mother; Hyperlipidemia in her sister.   ROS:   Please see the history of present illness.    ROS All other systems reviewed and are negative.   PHYSICAL EXAM:   VS:  BP 134/81   Pulse 90   Ht 5\' 2"  (1.575 m)   Wt 144 lb (65.3 kg)   SpO2 95%   BMI 26.34 kg/m     General: Alert, oriented x3, no distress. Well-nourished Head: no evidence of trauma, PERRL, EOMI, no exophtalmos or lid lag, no myxedema, no xanthelasma; normal ears, nose and oropharynx Neck: normal jugular venous pulsations and no hepatojugular reflux; brisk carotid pulses without delay and no carotid bruits Chest: Dry crackles in both bases, especially prominent on the right, no signs of consolidation by percussion or palpation, normal fremitus, symmetrical and full respiratory excursions Cardiovascular: normal position and quality of the apical impulse, regular rhythm, normal first and paradoxically  split second heart sound second heart sounds, no murmurs, rubs or gallops. Pastos defibrillator site Abdomen: no tenderness or distention, no masses by palpation, no abnormal pulsatility or arterial bruits, normal bowel sounds, no hepatosplenomegaly Extremities: no clubbing, cyanosis or edema; 2+ radial, ulnar and brachial pulses bilaterally; 2+ right femoral, posterior tibial and dorsalis pedis pulses; 2+ left femoral, posterior tibial and dorsalis pedis pulses; no subclavian or femoral bruits Neurological: grossly nonfocal Psych: euthymic mood, full affect  Wt Readings from Last 3 Encounters:  03/28/17 144 lb (65.3 kg)  02/21/17 137 lb (62.1 kg)  02/09/17 142 lb 13.7 oz (64.8 kg)      Studies/Labs Reviewed:   EKG:  EKG is not ordered today.  The ekg ordered May 30, June 22 and 02/08/2017 demonstrate NSR, LBBB  Recent Labs: 02/08/2017: B Natriuretic Peptide 424.3 02/09/2017: ALT 16; BUN 21; Creatinine, Ser 0.93; Hemoglobin 13.3; Platelets 153; Potassium 4.2; Sodium 137   Lipid Panel    Component Value Date/Time   CHOL 170 12/22/2014 0945   TRIG 59 12/22/2014 0945   HDL 81 12/22/2014 0945   CHOLHDL 2.1 12/22/2014 0945   VLDL 12 12/22/2014 0945   LDLCALC 77 12/22/2014 0945   LDLDIRECT 147 01/27/2008    ASSESSMENT:    1. Syncope and collapse   2. Chronic systolic CHF (congestive heart failure) ([facility])   3. NICM (nonischemic cardiomyopathy) ([facility])   4. Automatic implantable cardioverter-defibrillator in situ   5. NSVT (nonsustained ventricular tachycardia) ([facility])   6. LBBB (left bundle branch block)      PLAN:  In order of problems listed above:   1. CHF: I think she has shortness of breath mostly from lung problems. This is supported by the findings on the CT of her chest performed in July. Baseline BNP appears to be around 400, and it was at that level in both early June and during the hospitalization July. May have had more fluid overload when she was initially hospitalized in  late May. In fact, due to her problems with orthostatic hypotension and falls, I recommend that we reduce her diuretic to just twice weekly. She should weigh daily and call us if she gains  more than 3 pounds from today's weight. If that happens, I would advise going back to the every other day schedule. 2. NICMP: Probably secondary to chemotherapy 3. Syncope/falls: By history these appear mostly consistent with orthostatic hypotension. Therefore excessive diuresis should be avoided. She is no longer on ACE inhibitor's for this reason. She is on a tiny dose of beta blocker which ideally should be continued since she has had problems with nonsustained VT in the past. It is unlikely that she had arrhythmic syncope. Her heart rate will not decrease below 40 bpm. No significant tachyarrhythmia has been recorded to correlate with her events. 4. ICD: Normal device function, remote downloads every 3 months and yearly office visit 5. NSVT: None seen recently, has never required device intervention. 6. LBBB: Option to refer for CRT upgrade.    Medication Adjustments/Labs and Tests Ordered: Current medicines are reviewed at length with the patient today.  Concerns regarding medicines are outlined above.  Medication changes, Labs and Tests ordered today are listed in the Patient Instructions below. Patient Instructions  Dr Sallyanne Kuster has recommended making the following medication changes: 1. DECREASE Furosemide - take 1 tablet TWICE a week - Mondays and Fridays  Your physician recommends that you weigh, daily, at the same time every day, and in the same amount of clothing. Please record your daily weights on the handout provided and bring it to your next appointment. If you GAIN 3 POUNDS from today's weight, please take Furosemide EVERY OTHER DAY  Remote monitoring is used to monitor your Pacemaker or ICD from home. This monitoring reduces the number of office visits required to check your device to one time  per year. It allows [place] to keep an eye on the functioning of your device to ensure it is working properly. You are scheduled for a device check from home on Wednesday, September 26th, 2018. You may send your transmission at any time that day. If you have a wireless device, the transmission will be sent automatically. After your physician reviews your transmission, you will receive a notification with your next transmission date.  Dr Sallyanne Kuster recommends that you schedule a follow-up appointment in 12 months with a defibrillator check. You will receive a reminder letter in the mail two months in advance. If you don't receive a letter, please call our office to schedule the follow-up appointment.  If you need a refill on your cardiac medications before your next appointment, please call your pharmacy.    Signed, Sanda Klein, MD  03/28/2017 4:19 PM    [facility] [address], [place], [place]  [address] Phone: [phone]; Fax: [phone]

## 2017-03-28 NOTE — Patient Instructions (Signed)
Dr Sallyanne Kuster has recommended making the following medication changes: 1. DECREASE Furosemide - take 1 tablet TWICE a week - Mondays and Fridays  Your physician recommends that you weigh, daily, at the same time every day, and in the same amount of clothing. Please record your daily weights on the handout provided and bring it to your next appointment. If you GAIN 3 POUNDS from today's weight, please take Furosemide EVERY OTHER DAY  Remote monitoring is used to monitor your Pacemaker or ICD from home. This monitoring reduces the number of office visits required to check your device to one time per year. It allows [place] to keep an eye on the functioning of your device to ensure it is working properly. You are scheduled for a device check from home on Wednesday, September 26th, 2018. You may send your transmission at any time that day. If you have a wireless device, the transmission will be sent automatically. After your physician reviews your transmission, you will receive a notification with your next transmission date.  Dr Sallyanne Kuster recommends that you schedule a follow-up appointment in 12 months with a defibrillator check. You will receive a reminder letter in the mail two months in advance. If you don't receive a letter, please call our office to schedule the follow-up appointment.  If you need a refill on your cardiac medications before your next appointment, please call your pharmacy.

## 2017-04-03 ENCOUNTER — Other Ambulatory Visit: Payer: Self-pay

## 2017-04-17 LAB — CUP PACEART INCLINIC DEVICE CHECK
Battery Voltage: 2.76 V
Date Time Interrogation Session: 20180905145026
HIGH POWER IMPEDANCE MEASURED VALUE: 304 Ohm
HIGH POWER IMPEDANCE MEASURED VALUE: 74 Ohm
Implantable Lead Implant Date: 20111125
Implantable Pulse Generator Implant Date: 20111125
Lead Channel Pacing Threshold Amplitude: 1 V
Lead Channel Pacing Threshold Pulse Width: 0.4 ms
Lead Channel Sensing Intrinsic Amplitude: 12.75 mV
Lead Channel Setting Sensing Sensitivity: 0.45 mV
MDC IDC LEAD LOCATION: 753860
MDC IDC MSMT LEADCHNL RV IMPEDANCE VALUE: 418 Ohm
MDC IDC MSMT LEADCHNL RV SENSING INTR AMPL: 11.125 mV
MDC IDC SET LEADCHNL RV PACING AMPLITUDE: 2 V
MDC IDC SET LEADCHNL RV PACING PULSEWIDTH: 0.4 ms
MDC IDC STAT BRADY RV PERCENT PACED: 0.02 %

## 2017-04-18 ENCOUNTER — Ambulatory Visit (INDEPENDENT_AMBULATORY_CARE_PROVIDER_SITE_OTHER): Payer: Medicare Other | Admitting: *Deleted

## 2017-04-18 DIAGNOSIS — I428 Other cardiomyopathies: Secondary | ICD-10-CM | POA: Diagnosis not present

## 2017-04-18 NOTE — Progress Notes (Signed)
Remote ICD transmission.   

## 2017-04-19 ENCOUNTER — Other Ambulatory Visit: Payer: Self-pay | Admitting: Family Medicine

## 2017-04-19 ENCOUNTER — Encounter: Payer: Self-pay | Admitting: Cardiology

## 2017-04-19 LAB — CUP PACEART REMOTE DEVICE CHECK
Battery Voltage: 2.75 V
Date Time Interrogation Session: 20180926082309
HIGH POWER IMPEDANCE MEASURED VALUE: 304 Ohm
HighPow Impedance: 75 Ohm
Implantable Lead Implant Date: 20111125
Implantable Lead Location: 753860
Implantable Lead Model: 7122
Implantable Pulse Generator Implant Date: 20111125
Lead Channel Setting Pacing Amplitude: 2 V
Lead Channel Setting Pacing Pulse Width: 0.4 ms
MDC IDC MSMT LEADCHNL RV IMPEDANCE VALUE: 418 Ohm
MDC IDC MSMT LEADCHNL RV SENSING INTR AMPL: 11.25 mV
MDC IDC MSMT LEADCHNL RV SENSING INTR AMPL: 11.25 mV
MDC IDC SET LEADCHNL RV SENSING SENSITIVITY: 0.45 mV
MDC IDC STAT BRADY RV PERCENT PACED: 0.01 %

## 2017-04-20 ENCOUNTER — Other Ambulatory Visit: Payer: Self-pay | Admitting: Family Medicine

## 2017-05-11 ENCOUNTER — Encounter (HOSPITAL_COMMUNITY): Payer: Self-pay | Admitting: Emergency Medicine

## 2017-05-11 ENCOUNTER — Observation Stay (HOSPITAL_COMMUNITY)
Admission: EM | Admit: 2017-05-11 | Discharge: 2017-05-12 | Disposition: A | Discharge status: Home / Self Care | Payer: Medicare Other | Attending: Family Medicine | Admitting: Family Medicine

## 2017-05-11 ENCOUNTER — Emergency Department (HOSPITAL_COMMUNITY): Payer: Medicare Other

## 2017-05-11 DIAGNOSIS — Z9981 Dependence on supplemental oxygen: Secondary | ICD-10-CM | POA: Insufficient documentation

## 2017-05-11 DIAGNOSIS — I472 Ventricular tachycardia: Secondary | ICD-10-CM | POA: Diagnosis not present

## 2017-05-11 DIAGNOSIS — Z8349 Family history of other endocrine, nutritional and metabolic diseases: Secondary | ICD-10-CM | POA: Insufficient documentation

## 2017-05-11 DIAGNOSIS — I5020 Unspecified systolic (congestive) heart failure: Secondary | ICD-10-CM | POA: Diagnosis not present

## 2017-05-11 DIAGNOSIS — J440 Chronic obstructive pulmonary disease with acute lower respiratory infection: Secondary | ICD-10-CM | POA: Diagnosis not present

## 2017-05-11 DIAGNOSIS — Z79899 Other long term (current) drug therapy: Secondary | ICD-10-CM | POA: Insufficient documentation

## 2017-05-11 DIAGNOSIS — J209 Acute bronchitis, unspecified: Secondary | ICD-10-CM | POA: Diagnosis not present

## 2017-05-11 DIAGNOSIS — Z95810 Presence of automatic (implantable) cardiac defibrillator: Secondary | ICD-10-CM | POA: Insufficient documentation

## 2017-05-11 DIAGNOSIS — Z853 Personal history of malignant neoplasm of breast: Secondary | ICD-10-CM | POA: Diagnosis not present

## 2017-05-11 DIAGNOSIS — E876 Hypokalemia: Secondary | ICD-10-CM | POA: Diagnosis not present

## 2017-05-11 DIAGNOSIS — Z7951 Long term (current) use of inhaled steroids: Secondary | ICD-10-CM | POA: Insufficient documentation

## 2017-05-11 DIAGNOSIS — L409 Psoriasis, unspecified: Secondary | ICD-10-CM | POA: Insufficient documentation

## 2017-05-11 DIAGNOSIS — I5023 Acute on chronic systolic (congestive) heart failure: Secondary | ICD-10-CM | POA: Diagnosis present

## 2017-05-11 DIAGNOSIS — I447 Left bundle-branch block, unspecified: Secondary | ICD-10-CM | POA: Diagnosis not present

## 2017-05-11 DIAGNOSIS — Z7982 Long term (current) use of aspirin: Secondary | ICD-10-CM | POA: Insufficient documentation

## 2017-05-11 DIAGNOSIS — Z87891 Personal history of nicotine dependence: Secondary | ICD-10-CM | POA: Diagnosis not present

## 2017-05-11 DIAGNOSIS — R0602 Shortness of breath: Secondary | ICD-10-CM | POA: Diagnosis not present

## 2017-05-11 DIAGNOSIS — I110 Hypertensive heart disease with heart failure: Secondary | ICD-10-CM | POA: Insufficient documentation

## 2017-05-11 DIAGNOSIS — Z9221 Personal history of antineoplastic chemotherapy: Secondary | ICD-10-CM | POA: Insufficient documentation

## 2017-05-11 DIAGNOSIS — Z888 Allergy status to other drugs, medicaments and biological substances status: Secondary | ICD-10-CM | POA: Insufficient documentation

## 2017-05-11 DIAGNOSIS — I10 Essential (primary) hypertension: Secondary | ICD-10-CM | POA: Diagnosis present

## 2017-05-11 DIAGNOSIS — J9621 Acute and chronic respiratory failure with hypoxia: Secondary | ICD-10-CM | POA: Insufficient documentation

## 2017-05-11 DIAGNOSIS — Z8241 Family history of sudden cardiac death: Secondary | ICD-10-CM | POA: Insufficient documentation

## 2017-05-11 DIAGNOSIS — Z9842 Cataract extraction status, left eye: Secondary | ICD-10-CM | POA: Diagnosis not present

## 2017-05-11 DIAGNOSIS — I428 Other cardiomyopathies: Secondary | ICD-10-CM | POA: Diagnosis not present

## 2017-05-11 DIAGNOSIS — R0600 Dyspnea, unspecified: Secondary | ICD-10-CM | POA: Diagnosis not present

## 2017-05-11 DIAGNOSIS — Z9841 Cataract extraction status, right eye: Secondary | ICD-10-CM | POA: Insufficient documentation

## 2017-05-11 DIAGNOSIS — I480 Paroxysmal atrial fibrillation: Secondary | ICD-10-CM | POA: Diagnosis not present

## 2017-05-11 DIAGNOSIS — Z961 Presence of intraocular lens: Secondary | ICD-10-CM | POA: Diagnosis not present

## 2017-05-11 DIAGNOSIS — Z9889 Other specified postprocedural states: Secondary | ICD-10-CM | POA: Insufficient documentation

## 2017-05-11 DIAGNOSIS — J449 Chronic obstructive pulmonary disease, unspecified: Secondary | ICD-10-CM | POA: Diagnosis present

## 2017-05-11 DIAGNOSIS — J849 Interstitial pulmonary disease, unspecified: Secondary | ICD-10-CM | POA: Diagnosis not present

## 2017-05-11 DIAGNOSIS — J441 Chronic obstructive pulmonary disease with (acute) exacerbation: Secondary | ICD-10-CM | POA: Diagnosis not present

## 2017-05-11 DIAGNOSIS — Z66 Do not resuscitate: Secondary | ICD-10-CM | POA: Insufficient documentation

## 2017-05-11 DIAGNOSIS — Z9049 Acquired absence of other specified parts of digestive tract: Secondary | ICD-10-CM | POA: Insufficient documentation

## 2017-05-11 DIAGNOSIS — F17210 Nicotine dependence, cigarettes, uncomplicated: Secondary | ICD-10-CM | POA: Diagnosis not present

## 2017-05-11 DIAGNOSIS — Z800 Family history of malignant neoplasm of digestive organs: Secondary | ICD-10-CM | POA: Insufficient documentation

## 2017-05-11 DIAGNOSIS — I5021 Acute systolic (congestive) heart failure: Secondary | ICD-10-CM | POA: Diagnosis present

## 2017-05-11 DIAGNOSIS — I5043 Acute on chronic combined systolic (congestive) and diastolic (congestive) heart failure: Principal | ICD-10-CM | POA: Insufficient documentation

## 2017-05-11 DIAGNOSIS — Z9851 Tubal ligation status: Secondary | ICD-10-CM | POA: Insufficient documentation

## 2017-05-11 DIAGNOSIS — Z9012 Acquired absence of left breast and nipple: Secondary | ICD-10-CM | POA: Insufficient documentation

## 2017-05-11 DIAGNOSIS — J45909 Unspecified asthma, uncomplicated: Secondary | ICD-10-CM | POA: Diagnosis present

## 2017-05-11 LAB — CBC WITH DIFFERENTIAL/PLATELET
BASOS ABS: 0 10*3/uL (ref 0.0–0.1)
BASOS PCT: 0 %
EOS ABS: 0.2 10*3/uL (ref 0.0–0.7)
Eosinophils Relative: 2 %
HEMATOCRIT: 37.9 % (ref 36.0–46.0)
HEMOGLOBIN: 12.4 g/dL (ref 12.0–15.0)
Lymphocytes Relative: 25 %
Lymphs Abs: 2.2 10*3/uL (ref 0.7–4.0)
MCH: 29.9 pg (ref 26.0–34.0)
MCHC: 32.7 g/dL (ref 30.0–36.0)
MCV: 91.3 fL (ref 78.0–100.0)
MONOS PCT: 8 %
Monocytes Absolute: 0.7 10*3/uL (ref 0.1–1.0)
NEUTROS ABS: 5.7 10*3/uL (ref 1.7–7.7)
NEUTROS PCT: 65 %
Platelets: 222 10*3/uL (ref 150–400)
RBC: 4.15 MIL/uL (ref 3.87–5.11)
RDW: 13.3 % (ref 11.5–15.5)
WBC: 8.8 10*3/uL (ref 4.0–10.5)

## 2017-05-11 LAB — COMPREHENSIVE METABOLIC PANEL
ALBUMIN: 3.7 g/dL (ref 3.5–5.0)
ALK PHOS: 76 U/L (ref 38–126)
ALT: 11 U/L — ABNORMAL LOW (ref 14–54)
ANION GAP: 9 (ref 5–15)
AST: 25 U/L (ref 15–41)
BUN: 11 mg/dL (ref 6–20)
CO2: 26 mmol/L (ref 22–32)
Calcium: 8.9 mg/dL (ref 8.9–10.3)
Chloride: 104 mmol/L (ref 101–111)
Creatinine, Ser: 0.8 mg/dL (ref 0.44–1.00)
GFR calc Af Amer: >60 mL/min (ref >60)
GFR calc non Af Amer: >60 mL/min (ref >60)
GLUCOSE: 98 mg/dL (ref 65–99)
POTASSIUM: 4.4 mmol/L (ref 3.5–5.1)
SODIUM: 139 mmol/L (ref 135–145)
Total Bilirubin: 0.8 mg/dL (ref 0.3–1.2)
Total Protein: 7.6 g/dL (ref 6.5–8.1)

## 2017-05-11 LAB — TROPONIN I

## 2017-05-11 LAB — BRAIN NATRIURETIC PEPTIDE: B NATRIURETIC PEPTIDE 5: 1325.7 pg/mL — AB (ref 0.0–100.0)

## 2017-05-11 LAB — I-STAT TROPONIN, ED: Troponin i, poc: 0.01 ng/mL (ref 0.00–0.08)

## 2017-05-11 MED ORDER — POLYETHYLENE GLYCOL 3350 17 G PO PACK: 17.0000 g | PACK | Freq: Every day | ORAL | Status: DC | PRN

## 2017-05-11 MED ORDER — ENOXAPARIN SODIUM 40 MG/0.4ML ~~LOC~~ SOLN
40.0000 mg | SUBCUTANEOUS | Status: DC
  Administered 2017-05-11: 40 mg via SUBCUTANEOUS
  Filled 2017-05-11: qty 0.4

## 2017-05-11 MED ORDER — ONDANSETRON HCL 4 MG/2ML IJ SOLN: 4.0000 mg | Freq: Four times a day (QID) | INTRAMUSCULAR | Status: DC | PRN

## 2017-05-11 MED ORDER — MOMETASONE FURO-FORMOTEROL FUM 100-5 MCG/ACT IN AERO
2.0000 | INHALATION_SPRAY | Freq: Two times a day (BID) | RESPIRATORY_TRACT | Status: DC
  Administered 2017-05-11 – 2017-05-12 (×2): 2 via RESPIRATORY_TRACT
  Filled 2017-05-11: qty 8.8

## 2017-05-11 MED ORDER — SODIUM CHLORIDE 0.9% FLUSH
3.0000 mL | Freq: Two times a day (BID) | INTRAVENOUS | Status: DC
  Administered 2017-05-11 – 2017-05-12 (×2): 3 mL via INTRAVENOUS

## 2017-05-11 MED ORDER — FUROSEMIDE 10 MG/ML IJ SOLN
40.0000 mg | Freq: Once | INTRAMUSCULAR | Status: AC
  Administered 2017-05-11: 40 mg via INTRAVENOUS
  Filled 2017-05-11: qty 4

## 2017-05-11 MED ORDER — ADULT MULTIVITAMIN W/MINERALS CH
1.0000 | ORAL_TABLET | Freq: Every day | ORAL | Status: DC
  Administered 2017-05-11 – 2017-05-12 (×2): 1 via ORAL
  Filled 2017-05-11 (×2): qty 1

## 2017-05-11 MED ORDER — ENSURE ENLIVE PO LIQD
237.0000 mL | Freq: Two times a day (BID) | ORAL | Status: DC
  Administered 2017-05-12 (×2): 237 mL via ORAL

## 2017-05-11 MED ORDER — IPRATROPIUM-ALBUTEROL 0.5-2.5 (3) MG/3ML IN SOLN
3.0000 mL | Freq: Four times a day (QID) | RESPIRATORY_TRACT | Status: DC
  Administered 2017-05-11 – 2017-05-12 (×3): 3 mL via RESPIRATORY_TRACT
  Filled 2017-05-11 (×3): qty 3

## 2017-05-11 MED ORDER — ALBUTEROL SULFATE (2.5 MG/3ML) 0.083% IN NEBU
2.5000 mg | INHALATION_SOLUTION | Freq: Once | RESPIRATORY_TRACT | Status: AC
  Administered 2017-05-11: 2.5 mg via RESPIRATORY_TRACT
  Filled 2017-05-11: qty 3

## 2017-05-11 MED ORDER — SODIUM CHLORIDE 0.9% FLUSH: 3.0000 mL | INTRAVENOUS | Status: DC | PRN

## 2017-05-11 MED ORDER — PREDNISONE 50 MG PO TABS
60.0000 mg | ORAL_TABLET | Freq: Two times a day (BID) | ORAL | Status: DC
  Administered 2017-05-11 – 2017-05-12 (×2): 60 mg via ORAL
  Filled 2017-05-11 (×2): qty 1

## 2017-05-11 MED ORDER — METHYLPREDNISOLONE SODIUM SUCC 125 MG IJ SOLR
125.0000 mg | Freq: Once | INTRAMUSCULAR | Status: AC
  Administered 2017-05-11: 125 mg via INTRAVENOUS
  Filled 2017-05-11: qty 2

## 2017-05-11 MED ORDER — SODIUM CHLORIDE 0.9 % IV SOLN: 250.0000 mL | INTRAVENOUS | Status: DC | PRN

## 2017-05-11 MED ORDER — CHLORHEXIDINE GLUCONATE 0.12 % MT SOLN
15.0000 mL | Freq: Two times a day (BID) | OROMUCOSAL | Status: DC
  Administered 2017-05-11 – 2017-05-12 (×2): 15 mL via OROMUCOSAL
  Filled 2017-05-11 (×2): qty 15

## 2017-05-11 MED ORDER — ACETAMINOPHEN 325 MG PO TABS: 650.0000 mg | ORAL_TABLET | ORAL | Status: DC | PRN

## 2017-05-11 MED ORDER — IPRATROPIUM-ALBUTEROL 0.5-2.5 (3) MG/3ML IN SOLN
3.0000 mL | Freq: Once | RESPIRATORY_TRACT | Status: AC
  Administered 2017-05-11: 3 mL via RESPIRATORY_TRACT
  Filled 2017-05-11: qty 3

## 2017-05-11 MED ORDER — ASPIRIN 81 MG PO CHEW
81.0000 mg | CHEWABLE_TABLET | Freq: Every day | ORAL | Status: DC
  Administered 2017-05-11 – 2017-05-12 (×2): 81 mg via ORAL
  Filled 2017-05-11 (×2): qty 1

## 2017-05-11 MED ORDER — FUROSEMIDE 10 MG/ML IJ SOLN
40.0000 mg | Freq: Every day | INTRAMUSCULAR | Status: DC
  Administered 2017-05-12: 40 mg via INTRAVENOUS
  Filled 2017-05-11: qty 4

## 2017-05-11 MED ORDER — METOPROLOL TARTRATE 25 MG PO TABS
12.5000 mg | ORAL_TABLET | Freq: Two times a day (BID) | ORAL | Status: DC
  Administered 2017-05-11 – 2017-05-12 (×2): 12.5 mg via ORAL
  Filled 2017-05-11 (×2): qty 1

## 2017-05-11 MED ORDER — ORAL CARE MOUTH RINSE
15.0000 mL | Freq: Two times a day (BID) | OROMUCOSAL | Status: DC
  Administered 2017-05-12: 15 mL via OROMUCOSAL

## 2017-05-11 NOTE — ED Notes (Signed)
ED TO INPATIENT HANDOFF REPORT  Name/Age/Gender Brianna [name] 83 y.o. female  Code Status Code Status History    Date Active Date Inactive Code Status Order ID Comments User Context   02/08/2017  7:15 PM 02/09/2017  6:56 PM DNR [id number]  Kerney Elbe, DO Inpatient   12/20/2016  8:18 PM 12/24/2016  5:35 PM DNR [id number]  Lovenia Kim, MD Inpatient   12/28/2015  1:50 PM 12/30/2015  7:17 PM DNR [id number]  Bonnell Public, MD ED   05/10/2014  6:35 PM 05/14/2014  4:28 PM Full Code [id number]  Marianna Payment, MD Inpatient   05/10/2014  2:32 AM 05/10/2014  6:35 PM DNR [id number]  Hilton Sinclair, MD Inpatient    Questions for Most Recent Historical Code Status (Order [id number])    Question Answer Comment   In the event of cardiac or respiratory ARREST Do not call a "code blue"    In the event of cardiac or respiratory ARREST Do not perform Intubation, CPR, defibrillation or ACLS    In the event of cardiac or respiratory ARREST Use medication by any route, position, wound care, and other measures to relive pain and suffering. May use oxygen, suction and manual treatment of airway obstruction as needed for comfort.       Home/SNF/Other Home  Chief Complaint Shortness of Breath  Level of Care/Admitting Diagnosis ED Disposition    ED Disposition Condition Comment   Admit  Hospital Area: [facility] [[id number]]  Level of Care: Telemetry [5]  Admit to tele based on following criteria: Acute CHF  Diagnosis: Acute systolic CHF (congestive heart failure) [facility]) [[id number]]  Admitting Physician: Bonnielee Haff [[id number]]  Attending Physician: Bonnielee Haff [[id number]]  PT Class (Do Not Modify): Observation [104]  PT Acc Code (Do Not Modify): Observation [10022]       Medical History Past Medical History:  Diagnosis Date  . AICD (automatic cardioverter/defibrillator) present    a. 05/2010 MDT D33VRG Protecta VR single lead AICD (ser # [id number]).  Marland Kitchen Anxiety   .  Arthritis    "lower back; knees" (12/20/2016)  . Cancer of left breast ([place])   . CHF (congestive heart failure) ([place])   . Depression   . Emphysema of lung ([place])   . Falls frequently    12/20/2016 "couple falls since 07/2016"  . Heart murmur   . HFrEF (heart failure with reduced ejection fraction) ([place])    a. 12/2015 Echo: EF 40-45%;  b. 12/2016 Echo: EF 25-30%, diff HK.  Marland Kitchen Hyperlipidemia   . Hypertension   . Nonischemic cardiomyopathy ([place])    a. Felt to be 2/2 ChemoRx for Breast CA;  b. 2006 s/p AICD;  c. 05/2010 ICD upgrade: MDT D33VRG Protecta VR single lead AICD (ser # [id number] H);  d. 12/2015 Echo: EF 40-45%;  e. 12/2016 Echo: EF 25-30%, diff HK, mild MR, PASP 76mHg.  . Osteopenia   . Paroxysmal atrial fibrillation ([place]    a. Previously noted on device interrogation for brief periods. No OAC 2/2 falls/frailty.  . Pneumonia ~ 11025-8527X 3    Allergies Allergies  Allergen Reactions  . Atorvastatin Other (See Comments)    Reaction:  Fatigue   . Claritin [Loratadine] Other (See Comments)    Reaction:  Fatigue   . Crestor [Rosuvastatin] Other (See Comments)    Reaction:  Muscle pain     IV Location/Drains/Wounds Patient Lines/Drains/Airways Status   Active Line/Drains/Airways    Name:   Placement date:   Placement  time:   Site:   Days:   Peripheral IV 05/11/17 Left Forearm  05/11/17    1443    Forearm    less than 1   Incision (Closed) 05/10/14 Hip Right  05/10/14    1545      1097   Wound / Incision (Open or Dehisced) 11/29/15 Other (Comment) Elbow Right pink raised area near right elbow with scab over top of wound   11/29/15    1825    Elbow    529   Wound / Incision (Open or Dehisced) 12/28/15 Laceration Head Left 1cm  12/28/15    1320    Head    500          Labs/Imaging Results for orders placed or performed during the hospital encounter of 05/11/17 (from the past 48 hour(s))  CBC with Differential/Platelet     Status: None   Collection Time: 05/11/17 11:38 AM   Result Value Ref Range   WBC 8.8 4.0 - 10.5 K/uL   RBC 4.15 3.87 - 5.11 MIL/uL   Hemoglobin 12.4 12.0 - 15.0 g/dL   HCT 37.9 36.0 - 46.0 %   MCV 91.3 78.0 - 100.0 fL   MCH 29.9 26.0 - 34.0 pg   MCHC 32.7 30.0 - 36.0 g/dL   RDW 13.3 11.5 - 15.5 %   Platelets 222 150 - 400 K/uL   Neutrophils Relative % 65 %   Neutro Abs 5.7 1.7 - 7.7 K/uL   Lymphocytes Relative 25 %   Lymphs Abs 2.2 0.7 - 4.0 K/uL   Monocytes Relative 8 %   Monocytes Absolute 0.7 0.1 - 1.0 K/uL   Eosinophils Relative 2 %   Eosinophils Absolute 0.2 0.0 - 0.7 K/uL   Basophils Relative 0 %   Basophils Absolute 0.0 0.0 - 0.1 K/uL  Comprehensive metabolic panel     Status: Abnormal   Collection Time: 05/11/17 11:38 AM  Result Value Ref Range   Sodium 139 135 - 145 mmol/L   Potassium 4.4 3.5 - 5.1 mmol/L   Chloride 104 101 - 111 mmol/L   CO2 26 22 - 32 mmol/L   Glucose, Bld 98 65 - 99 mg/dL   BUN 11 6 - 20 mg/dL   Creatinine, Ser 0.80 0.44 - 1.00 mg/dL   Calcium 8.9 8.9 - 10.3 mg/dL   Total Protein 7.6 6.5 - 8.1 g/dL   Albumin 3.7 3.5 - 5.0 g/dL   AST 25 15 - 41 U/L   ALT 11 (L) 14 - 54 U/L   Alkaline Phosphatase 76 38 - 126 U/L   Total Bilirubin 0.8 0.3 - 1.2 mg/dL   GFR calc non Af Amer >60 >60 mL/min   GFR calc Af Amer >60 >60 mL/min    Comment: (NOTE) The eGFR has been calculated using the CKD EPI equation. This calculation has not been validated in all clinical situations. eGFR's persistently <60 mL/min signify possible Chronic Kidney Disease.    Anion gap 9 5 - 15  Brain natriuretic peptide     Status: Abnormal   Collection Time: 05/11/17 11:38 AM  Result Value Ref Range   B Natriuretic Peptide 1,325.7 (H) 0.0 - 100.0 pg/mL  I-stat troponin, ED     Status: None   Collection Time: 05/11/17 11:53 AM  Result Value Ref Range   Troponin i, poc 0.01 0.00 - 0.08 ng/mL   Comment 3            Comment: Due to the  release kinetics of cTnI, a negative result within the first hours of the onset of symptoms  does not rule out myocardial infarction with certainty. If myocardial infarction is still suspected, repeat the test at appropriate intervals.    Dg Chest Port 1 View  Result Date: 05/11/2017 CLINICAL DATA:  Increased weakness, shortness of breath EXAM: PORTABLE CHEST 1 VIEW COMPARISON:  02/09/2017 FINDINGS: Bilateral diffuse mild interstitial thickening. No pleural effusion or pneumothorax. Mild stable cardiomegaly. Dual lead cardiac pacemaker. No acute osseous abnormality. IMPRESSION: Cardiomegaly with mild pulmonary vascular congestion. Electronically Signed   By: Kathreen Devoid   On: 05/11/2017 12:23    Pending Labs Unresulted Labs    Start     Ordered   Signed and Held  Basic metabolic panel  Daily,   R     Signed and Held   Signed and Held  Troponin I  Now then every 6 hours,   R     Signed and Held      Vitals/Pain Today's Vitals   05/11/17 1155 05/11/17 1237 05/11/17 1430 05/11/17 1440  BP:  (!) 146/64 121/74   Pulse:  85 86   Resp:  (!) 34 18   Temp:  98 F (36.7 C) 97.8 F (36.6 C)   TempSrc:  Oral Oral   SpO2: 95% 98% 95%   Weight:    130 lb (59 kg)  Height:    '5\' 5"'  (1.651 m)    Isolation Precautions No active isolations  Medications Medications  furosemide (LASIX) injection 40 mg (not administered)  ipratropium-albuterol (DUONEB) 0.5-2.5 (3) MG/3ML nebulizer solution 3 mL (3 mLs Nebulization Given 05/11/17 1155)  albuterol (PROVENTIL) (2.5 MG/3ML) 0.083% nebulizer solution 2.5 mg (2.5 mg Nebulization Given 05/11/17 1155)  methylPREDNISolone sodium succinate (SOLU-MEDROL) 125 mg/2 mL injection 125 mg (125 mg Intravenous Given 05/11/17 1126)    Mobility walks with device [facility] ALSO

## 2017-05-11 NOTE — ED Notes (Signed)
Bed: KB52 Expected date:  Expected time:  Means of arrival:  Comments: EMS-weakness/SOB

## 2017-05-11 NOTE — ED Provider Notes (Signed)
[facility] DEPT Provider Note   CSN: [id number] Arrival date & time: 05/11/17  1049     History   Chief Complaint Chief Complaint  Patient presents with  . Shortness of Breath  . Weakness    HPI Brianna [name] is a 83 y.o. female.  Patient complains of some shortness of breath and weakness for a number days.   The history is provided by the patient.  Shortness of Breath  This is a new problem. The problem occurs continuously.The current episode started more than 2 days ago. The problem has not changed since onset.Pertinent negatives include no fever, no headaches, no cough, no chest pain, no abdominal pain and no rash. It is unknown what precipitated the problem. Risk factors: hx of chf. She has tried nothing for the symptoms. The treatment provided no relief. She has had prior hospitalizations. She has had prior ED visits. She has had prior ICU admissions. Associated medical issues include COPD.  Weakness  Associated symptoms include shortness of breath. Pertinent negatives include no chest pain and no headaches.    Past Medical History:  Diagnosis Date  . AICD (automatic cardioverter/defibrillator) present    a. 05/2010 MDT D33VRG Protecta VR single lead AICD (ser # [id number] H).  Marland Kitchen Anxiety   . Arthritis    "lower back; knees" (12/20/2016)  . Cancer of left breast ([place])   . CHF (congestive heart failure) ([place])   . Depression   . Emphysema of lung ([place])   . Falls frequently    12/20/2016 "couple falls since 07/2016"  . Heart murmur   . HFrEF (heart failure with reduced ejection fraction) ([place])    a. 12/2015 Echo: EF 40-45%;  b. 12/2016 Echo: EF 25-30%, diff HK.  Marland Kitchen Hyperlipidemia   . Hypertension   . Nonischemic cardiomyopathy ([place])    a. Felt to be 2/2 ChemoRx for Breast CA;  b. 2006 s/p AICD;  c. 05/2010 ICD upgrade: MDT D33VRG Protecta VR single lead AICD (ser # [id number] H);  d. 12/2015 Echo: EF 40-45%;  e. 12/2016 Echo: EF 25-30%, diff  HK, mild MR, PASP 30mmHg.  . Osteopenia   . Paroxysmal atrial fibrillation ([place])    a. Previously noted on device interrogation for brief periods. No OAC 2/2 falls/frailty.  . Pneumonia ~ 3295-1884 X 3    Patient Active Problem List   Diagnosis Date Noted  . Syncope and collapse 02/08/2017  . Right hip pain 02/08/2017  . Chronic systolic CHF (congestive heart failure) ([place]) 02/08/2017  . Chronic combined systolic and diastolic CHF (congestive heart failure) ([place]) 02/08/2017  . Hypokalemia 12/27/2016  . LBBB (left bundle branch block) 12/23/2016  . Atrial fibrillation, new onset ([place]) 12/23/2016  . COPD exacerbation ([place])   . Acute on chronic systolic congestive heart failure ([place])   . Hypoxia 12/20/2016  . Fracture of right humerus 12/29/2015  . Syncope 12/28/2015  . COPD (chronic obstructive pulmonary disease) ([place]) 06/10/2014  . Asthma, chronic 05/19/2014  . Constipation 05/19/2014  . Major depression, chronic 05/19/2014  . Protein-calorie malnutrition, severe ([place]) 05/12/2014  . Hip fracture, intertrochanteric ([place]) 05/10/2014  . NICM (nonischemic cardiomyopathy) ([place]) 03/24/2014  . Dyspnea 08/03/2013  . Automatic implantable cardioverter-defibrillator in situ 04/16/2013  . CYSTOCELE WITHOUT MENTION UTERINE PROLAPSE LAT 10/12/2008  . History of breast cancer 11/05/2007  . INSOMNIA-SLEEP DISORDER-UNSPEC 07/10/2007  . HYPERCHOLESTEROLEMIA 09/20/2006  . Essential hypertension 09/20/2006  . VENTRICULAR TACHYCARDIA 09/20/2006  . Chronic diastolic heart failure ([place]) 09/20/2006  . PSORIASIS 09/20/2006  .  Osteoporosis 09/20/2006    Past Surgical History:  Procedure Laterality Date  . APPENDECTOMY  1943  . BREAST BIOPSY Left  [id number]  . CARDIAC CATHETERIZATION  12/18/2002   Normal coronary arteries and normal LV function  . CARDIAC DEFIBRILLATOR PLACEMENT  10/13/2004   Medtronic Maximo VR, model [id number], serial [id number]  . CATARACT EXTRACTION W/ INTRAOCULAR LENS  IMPLANT,  BILATERAL    . FEMUR IM NAIL Right 05/10/2014   Procedure: INTRAMEDULLARY (IM) NAIL FEMORAL;  Surgeon: Marianna Payment, MD;  Location: [facility];  Service: Orthopedics;  Laterality: Right;  . FRACTURE SURGERY    . MASTECTOMY Left  [id number]  . PACEMAKER INSERTION  06/17/2010   Medtronic Protecta VR, model #D334VRG, serial [id number]  . TONSILLECTOMY  1940  . TUBAL LIGATION      OB History    No data available       Home Medications    Prior to Admission medications   Medication Sig Start Date End Date Taking? Authorizing Provider  ADVAIR DISKUS 100-50 MCG/DOSE AEPB INHALE 1 PUFF INTO THE LUNGS 2 (TWO) TIMES DAILY. 10/08/16   Donnamae Jude, MD  albuterol [facility] HFA) 108 (90 Base) MCG/ACT inhaler Inhale 2 puffs into the lungs every 6 (six) hours as needed for wheezing or shortness of breath. 12/01/16   Ivar Drape D, PA  aspirin 81 MG chewable tablet Chew 81 mg by mouth daily.    [provider]  Calcium Carbonate-Vitamin D (CALCIUM 600+D) 600-400 MG-UNIT tablet Take 1 tablet by mouth daily.    [provider]  feeding supplement, ENSURE ENLIVE, (ENSURE ENLIVE) LIQD Take 237 mLs by mouth 2 (two) times daily between meals. 02/09/17   Raiford Noble Latif, DO  furosemide (LASIX) 20 MG tablet Take 1 tablet (20 mg total) by mouth every other day AS DIRECTED. 03/28/17   Croitoru, Mihai, MD  ipratropium-albuterol (DUONEB) 0.5-2.5 (3) MG/3ML SOLN Take 3 mLs by nebulization 3 (three) times daily. 02/09/17   Sheikh, Omair Latif, DO  KLOR-CON M20 20 MEQ tablet TAKE 1 TABLET BY MOUTH EVERY DAY 04/20/17   Donnamae Jude, MD  metoprolol tartrate (LOPRESSOR) 25 MG tablet TAKE 0.5 TABLETS (12.5 MG TOTAL) BY MOUTH 2 (TWO) TIMES DAILY. 04/19/17   Donnamae Jude, MD  Misc. Devices (ROLLATOR) MISC 1 Units by Does not apply route as needed. 02/09/17   Truett Mainland, DO  Multiple Vitamin (MULTIVITAMIN WITH MINERALS) TABS tablet Take 1 tablet by mouth daily.    [provider]    polyethylene glycol (MIRALAX / GLYCOLAX) packet Take 17 g by mouth daily as needed for mild constipation. 02/09/17   Kerney Elbe, DO    Family History Family History  Problem Relation Age of Onset  . Heart disease Mother        Pacemaker  . Heart disease Father        No details.    . Hyperlipidemia Sister   . Heart disease Brother        "Heart Attacks"  Died age 60  . Cancer Daughter        Oral cancer and "knot" on right cheek that was cancer    Social History Social History  Substance Use Topics  . Smoking status: Light Tobacco Smoker    Packs/day: 0.10    Years: 44.00    Types: Cigarettes  . Smokeless tobacco: Never Used  . Alcohol use 3.0 oz/week    5 Glasses of wine per week  Allergies   Atorvastatin; Claritin [loratadine]; and Crestor [rosuvastatin]   Review of Systems Review of Systems  Constitutional: Negative for appetite change, fatigue and fever.  HENT: Negative for congestion, ear discharge and sinus pressure.   Eyes: Negative for discharge.  Respiratory: Positive for shortness of breath. Negative for cough.   Cardiovascular: Negative for chest pain.  Gastrointestinal: Negative for abdominal pain and diarrhea.  Genitourinary: Negative for frequency and hematuria.  Musculoskeletal: Negative for back pain.  Skin: Negative for rash.  Neurological: Positive for weakness. Negative for seizures and headaches.  Psychiatric/Behavioral: Negative for hallucinations.     Physical Exam Updated Vital Signs BP (!) 146/64 (BP Location: Left Arm)   Pulse 85   Temp 98 F (36.7 C) (Oral)   Resp (!) 34   SpO2 98%   Physical Exam  Constitutional: She is oriented to person, place, and time. She appears well-developed.  HENT:  Head: Normocephalic.  Eyes: Conjunctivae and EOM are normal. No scleral icterus.  Neck: Neck supple. No thyromegaly present.  Cardiovascular: Normal rate and regular rhythm.  Exam reveals no gallop and no friction rub.   No  murmur heard. Pulmonary/Chest: No stridor. She has no wheezes. She has no rales. She exhibits no tenderness.  Abdominal: She exhibits no distension. There is no tenderness. There is no rebound.  Musculoskeletal: Normal range of motion. She exhibits no edema.  Lymphadenopathy:    She has no cervical adenopathy.  Neurological: She is oriented to person, place, and time. She exhibits normal muscle tone. Coordination normal.  Skin: No rash noted. No erythema.  Psychiatric: She has a normal mood and affect. Her behavior is normal.     ED Treatments / Results  Labs (all labs ordered are listed, but only abnormal results are displayed) Labs Reviewed  COMPREHENSIVE METABOLIC PANEL - Abnormal; Notable for the following:       Result Value   ALT 11 (*)    All other components within normal limits  BRAIN NATRIURETIC PEPTIDE - Abnormal; Notable for the following:    B Natriuretic Peptide 1,325.7 (*)    All other components within normal limits  CBC WITH DIFFERENTIAL/PLATELET  I-STAT TROPONIN, ED    EKG  EKG Interpretation None       Radiology Dg Chest Port 1 View  Result Date: 05/11/2017 CLINICAL DATA:  Increased weakness, shortness of breath EXAM: PORTABLE CHEST 1 VIEW COMPARISON:  02/09/2017 FINDINGS: Bilateral diffuse mild interstitial thickening. No pleural effusion or pneumothorax. Mild stable cardiomegaly. Dual lead cardiac pacemaker. No acute osseous abnormality. IMPRESSION: Cardiomegaly with mild pulmonary vascular congestion. Electronically Signed   By: Kathreen Devoid   On: 05/11/2017 12:23    Procedures Procedures (including critical care time)  Medications Ordered in ED Medications  furosemide (LASIX) injection 40 mg (not administered)  ipratropium-albuterol (DUONEB) 0.5-2.5 (3) MG/3ML nebulizer solution 3 mL (3 mLs Nebulization Given 05/11/17 1155)  albuterol (PROVENTIL) (2.5 MG/3ML) 0.083% nebulizer solution 2.5 mg (2.5 mg Nebulization Given 05/11/17 1155)    methylPREDNISolone sodium succinate (SOLU-MEDROL) 125 mg/2 mL injection 125 mg (125 mg Intravenous Given 05/11/17 1126)     Initial Impression / Assessment and Plan / ED Course  I have reviewed the triage vital signs and the nursing notes.  Pertinent labs & imaging results that were available during my care of the patient were reviewed by me and considered in my medical decision making (see chart for details).     Patient with congestive heart failure. She'll be admitted to medicine for  diuresis  Final Clinical Impressions(s) / ED Diagnoses   Final diagnoses:  Systolic congestive heart failure, unspecified HF chronicity ([place])    New Prescriptions New Prescriptions   No medications on file     Milton Ferguson, MD 05/11/17 1352

## 2017-05-11 NOTE — ED Triage Notes (Signed)
Per EMS-states SOB, weakness for over a week-possible fluid on right-dyspnea on exertion-symptoms progressively getting worse-unsteady gait due to weakness

## 2017-05-11 NOTE — ED Notes (Signed)
[facility] Signs [phone]. Report [id number]

## 2017-05-11 NOTE — H&P (Signed)
[facility] History and Physical  Brianna [name] VOJ:[id number] DOB: 02-Aug-1929 DOA: 05/11/2017   PCP: Donnamae Jude, MD  Specialists: Dr. Sallyanne Kuster is her cardiologist  Chief Complaint: shortness of breath for the past few days  HPI: Brianna [name] is a 83 y.o. female with a past medical history of chronic systolic congestive heart failure with EF of 25-30% based on an echocardiogram done in June, history of AICD placement, COPD on home oxygen at 2lpm, who only recently stopped smoking. She was in her usual state of health until a few days ago when she started experiencing shortness of breath with minimal exertion. She also experienced some chest pain but that has been ongoing for a month. She denies any weight changes recently. She tells me that she has been checking her weight and her weight is usually between 131-135 pounds. She did weigh 144 pounds back in September at her cardiologist's office. She has been taking her Lasix twice a day on Mondays and Fridays. She has noticed some leg swelling but not significantly so. She's had a cough which has been dry for the most part. Denies any fever, chills. No sick contacts. Has noticed some wheezing.  In the emergency department, patient was found to have elevated BNP. She was thought to have acute CHF exacerbation.  Home Medications: Prior to Admission medications   Medication Sig Start Date End Date Taking? Authorizing Provider  ADVAIR DISKUS 100-50 MCG/DOSE AEPB INHALE 1 PUFF INTO THE LUNGS 2 (TWO) TIMES DAILY. 10/08/16   Donnamae Jude, MD  albuterol [facility] HFA) 108 (90 Base) MCG/ACT inhaler Inhale 2 puffs into the lungs every 6 (six) hours as needed for wheezing or shortness of breath. 12/01/16   Ivar Drape D, PA  aspirin 81 MG chewable tablet Chew 81 mg by mouth daily.    [provider]  Calcium Carbonate-Vitamin D (CALCIUM 600+D) 600-400 MG-UNIT tablet Take 1 tablet by mouth daily.    [provider]    feeding supplement, ENSURE ENLIVE, (ENSURE ENLIVE) LIQD Take 237 mLs by mouth 2 (two) times daily between meals. 02/09/17   Raiford Noble Latif, DO  furosemide (LASIX) 20 MG tablet Take 1 tablet (20 mg total) by mouth every other day AS DIRECTED. 03/28/17   Croitoru, Mihai, MD  ipratropium-albuterol (DUONEB) 0.5-2.5 (3) MG/3ML SOLN Take 3 mLs by nebulization 3 (three) times daily. 02/09/17   Sheikh, Omair Latif, DO  KLOR-CON M20 20 MEQ tablet TAKE 1 TABLET BY MOUTH EVERY DAY 04/20/17   Donnamae Jude, MD  metoprolol tartrate (LOPRESSOR) 25 MG tablet TAKE 0.5 TABLETS (12.5 MG TOTAL) BY MOUTH 2 (TWO) TIMES DAILY. 04/19/17   Donnamae Jude, MD  Misc. Devices (ROLLATOR) MISC 1 Units by Does not apply route as needed. 02/09/17   Truett Mainland, DO  Multiple Vitamin (MULTIVITAMIN WITH MINERALS) TABS tablet Take 1 tablet by mouth daily.    [provider]  polyethylene glycol (MIRALAX / GLYCOLAX) packet Take 17 g by mouth daily as needed for mild constipation. 02/09/17   Kerney Elbe, DO    Allergies:  Allergies  Allergen Reactions  . Atorvastatin Other (See Comments)    Reaction:  Fatigue   . Claritin [Loratadine] Other (See Comments)    Reaction:  Fatigue   . Crestor [Rosuvastatin] Other (See Comments)    Reaction:  Muscle pain     Past Medical History: Past Medical History:  Diagnosis Date  . AICD (automatic cardioverter/defibrillator) present    a.  05/2010 MDT D33VRG Protecta VR single lead AICD (ser # [id number]).  Marland Kitchen Anxiety   . Arthritis    "lower back; knees" (12/20/2016)  . Cancer of left breast ([place])   . CHF (congestive heart failure) ([place])   . Depression   . Emphysema of lung ([place])   . Falls frequently    12/20/2016 "couple falls since 07/2016"  . Heart murmur   . HFrEF (heart failure with reduced ejection fraction) ([place])    a. 12/2015 Echo: EF 40-45%;  b. 12/2016 Echo: EF 25-30%, diff HK.  Marland Kitchen Hyperlipidemia   . Hypertension   . Nonischemic cardiomyopathy ([place])    a.  Felt to be 2/2 ChemoRx for Breast CA;  b. 2006 s/p AICD;  c. 05/2010 ICD upgrade: MDT D33VRG Protecta VR single lead AICD (ser # [id number]);  d. 12/2015 Echo: EF 40-45%;  e. 12/2016 Echo: EF 25-30%, diff HK, mild MR, PASP 56mmHg.  . Osteopenia   . Paroxysmal atrial fibrillation ([place])    a. Previously noted on device interrogation for brief periods. No OAC 2/2 falls/frailty.  . Pneumonia ~ [id number] X 3    Past Surgical History:  Procedure Laterality Date  . APPENDECTOMY  1943  . BREAST BIOPSY Left  [id number]  . CARDIAC CATHETERIZATION  12/18/2002   Normal coronary arteries and normal LV function  . CARDIAC DEFIBRILLATOR PLACEMENT  10/13/2004   Medtronic Maximo VR, model Q7220614, serial [id number]  . CATARACT EXTRACTION W/ INTRAOCULAR LENS  IMPLANT, BILATERAL    . FEMUR IM NAIL Right 05/10/2014   Procedure: INTRAMEDULLARY (IM) NAIL FEMORAL;  Surgeon: Marianna Payment, MD;  Location: [facility];  Service: Orthopedics;  Laterality: Right;  . FRACTURE SURGERY    . MASTECTOMY Left  [id number]  . PACEMAKER INSERTION  06/17/2010   Medtronic Protecta VR, model #D334VRG, serial [id number]  . TONSILLECTOMY  1940  . TUBAL LIGATION      Social History: patient lives by herself. Quit smoking just about a month ago. Has 1 glass of wine on a daily basis. She has a living will. She is a DO NOT RESUSCITATE.   Family History:  Family History  Problem Relation Age of Onset  . Heart disease Mother        Pacemaker  . Heart disease Father        No details.    . Hyperlipidemia Sister   . Heart disease Brother        "Heart Attacks"  Died age 72  . Cancer Daughter        Oral cancer and "knot" on right cheek that was cancer     Review of Systems - General ROS: positive for  - fatigue Psychological ROS: negative Ophthalmic ROS: negative ENT ROS: negative Allergy and Immunology ROS: negative Hematological and Lymphatic ROS: negative Endocrine ROS: negative Respiratory ROS: as in  hpi Cardiovascular ROS: as in hpi Gastrointestinal ROS: no abdominal pain, change in bowel habits, or black or bloody stools Genito-Urinary ROS: no dysuria, trouble voiding, or hematuria Musculoskeletal ROS: negative Neurological ROS: no TIA or stroke symptoms Dermatological ROS: negative  Physical Examination  Vitals:   05/11/17 1155 05/11/17 1237  BP:  (!) 146/64  Pulse:  85  Resp:  (!) 34  Temp:  98 F (36.7 C)  TempSrc:  Oral  SpO2: 95% 98%    BP (!) 146/64 (BP Location: Left Arm)   Pulse 85   Temp 98 F (36.7 C) (Oral)   Resp (!) 34   SpO2  98%   General appearance: alert, cooperative, appears stated age and no distress Head: Normocephalic, without obvious abnormality, atraumatic Eyes: conjunctivae/corneas clear. PERRL, EOM's intact.  Throat: lips, mucosa, and tongue normal; teeth and gums normal Neck: no adenopathy, no carotid bruit,  supple, symmetrical, trachea midline and thyroid not enlarged, symmetric, no tenderness/mass/nodules Resp: crackles bilaterally. Some wheezing is appreciated. Highly tachypneic. No use of accessory muscles. Cardio: regular rate and rhythm, S1, S2 normal, no murmur, click, rub or gallop. JVD is noted. GI: soft, non-tender; bowel sounds normal; no masses,  no organomegaly Extremities: minimal edema bilateral lower extremities Pulses: 2+ and symmetric Skin: Skin color, texture, turgor normal. No rashes or lesions Lymph nodes: Cervical, supraclavicular, and axillary nodes normal. Neurologic: no focal neurological deficits.   Labs on Admission: I have personally reviewed following labs and imaging studies  CBC:  Recent Labs Lab 05/11/17 1138  WBC 8.8  NEUTROABS 5.7  HGB 12.4  HCT 37.9  MCV 91.3  PLT 921   Basic Metabolic Panel:  Recent Labs Lab 05/11/17 1138  NA 139  K 4.4  CL 104  CO2 26  GLUCOSE 98  BUN 11  CREATININE 0.80  CALCIUM 8.9   GFR: CrCl cannot be calculated (Unknown ideal weight.). Liver Function  Tests:  Recent Labs Lab 05/11/17 1138  AST 25  ALT 11*  ALKPHOS 76  BILITOT 0.8  PROT 7.6  ALBUMIN 3.7    Radiological Exams on Admission: Dg Chest Port 1 View  Result Date: 05/11/2017 CLINICAL DATA:  Increased weakness, shortness of breath EXAM: PORTABLE CHEST 1 VIEW COMPARISON:  02/09/2017 FINDINGS: Bilateral diffuse mild interstitial thickening. No pleural effusion or pneumothorax. Mild stable cardiomegaly. Dual lead cardiac pacemaker. No acute osseous abnormality. IMPRESSION: Cardiomegaly with mild pulmonary vascular congestion. Electronically Signed   By: Kathreen Devoid   On: 05/11/2017 12:23    My interpretation of Electrocardiogram: sinus rhythm. PVC. LBBB. Similar to previous EKG.   Problem List  Principal Problem:   Acute on chronic systolic congestive heart failure (HCC) Active Problems:   Essential hypertension   Automatic implantable cardioverter-defibrillator in situ   Asthma, chronic   COPD (chronic obstructive pulmonary disease) (HCC)   Acute systolic CHF (congestive heart failure) (HCC)   Assessment: this is a 83 year old Caucasian female with a past medical history as stated earlier to presents with a few day history of worsening shortness of breath mainly with exertion along with some chest pain. She is noted to have elevated BNP. She has a history of systolic CHF with an EF of 25-30%. However, she also mentions that she hasn't gained much weight. I think this is a combination of fluid overload due to CHF as well as an element of bronchitis. She does have wheezing on examination. She also has interstitial lung disease which could also be contributing.  Plan: #1 dyspnea: Likely a combination of acute systolic CHF and mild acute bronchitis. She'll be given a dose of IV Lasix today and tomorrow. She'll be given nebulizer treatments. She'll be placed on prednisone. Last echocardiogram was in June. No clear indication to repeat. She also has interstitial lung disease  based on previous CT scan.  #2 Acute systolic CHF/chest pain: As discussed above she'll be given IV Lasix. Check her weight. Ins and outs. Hopefully, she'll turn around quickly. EKG shows LBBB which is chronic. Troponin is normal. Her chest symptoms are most likely due to her CHF. Trend troponins. She is not on an ACE inhibitor as previously she has been  very sensitive to this medication resulting in drop in blood pressure. She is on metoprolol, which will be continued.  #3 history of COPD on home oxygen with the mild exacerbation: She'll be given steroids, nebulizer treatments. Continue inhaled steroids.  #4 history of tobacco abuse: She quit about a month ago. She was congratulated.  DVT Prophylaxis: Lovenox Code Status: DO NOT RESUSCITATE Family Communication: discussed with the patient and her daughter  Consults called: none   Severity of Illness: The appropriate patient status for this patient is OBSERVATION. Observation status is judged to be reasonable and necessary in order to provide the required intensity of service to ensure the patient's safety. The patient's presenting symptoms, physical exam findings, and initial radiographic and laboratory data in the context of their medical condition is felt to place them at decreased risk for further clinical deterioration. Furthermore, it is anticipated that the patient will be medically stable for discharge from the hospital within 2 midnights of admission. The following factors support the patient status of observation.   " The patient's presenting symptoms include shortness of breath. " The physical exam findings include crackles in the lungs. " The initial radiographic and laboratory data are concerning for bronchitis and CHF exacerbation.  Further management decisions will depend on results of further testing and patient's response to treatment.   [facility]  [facility] Pager [phone]  If 7PM-7AM, please contact  night-coverage [email or web address] Password [email or web address]  05/11/2017, 2:09 PM

## 2017-05-11 NOTE — ED Notes (Signed)
RT called

## 2017-05-12 DIAGNOSIS — J439 Emphysema, unspecified: Secondary | ICD-10-CM | POA: Diagnosis not present

## 2017-05-12 DIAGNOSIS — I5023 Acute on chronic systolic (congestive) heart failure: Secondary | ICD-10-CM | POA: Diagnosis not present

## 2017-05-12 DIAGNOSIS — I10 Essential (primary) hypertension: Secondary | ICD-10-CM

## 2017-05-12 DIAGNOSIS — I5043 Acute on chronic combined systolic (congestive) and diastolic (congestive) heart failure: Secondary | ICD-10-CM | POA: Diagnosis not present

## 2017-05-12 LAB — BASIC METABOLIC PANEL
Anion gap: 10 (ref 5–15)
BUN: 23 mg/dL — AB (ref 6–20)
CHLORIDE: 100 mmol/L — AB (ref 101–111)
CO2: 25 mmol/L (ref 22–32)
CREATININE: 1.06 mg/dL — AB (ref 0.44–1.00)
Calcium: 9.1 mg/dL (ref 8.9–10.3)
GFR calc non Af Amer: 47 mL/min — ABNORMAL LOW (ref >60)
GFR, EST AFRICAN AMERICAN: 54 mL/min — AB (ref >60)
Glucose, Bld: 154 mg/dL — ABNORMAL HIGH (ref 65–99)
Potassium: 4.4 mmol/L (ref 3.5–5.1)
SODIUM: 135 mmol/L (ref 135–145)

## 2017-05-12 LAB — TROPONIN I: Troponin I: <0.03 ng/mL (ref <0.03)

## 2017-05-12 LAB — BRAIN NATRIURETIC PEPTIDE: B NATRIURETIC PEPTIDE 5: 914.6 pg/mL — AB (ref 0.0–100.0)

## 2017-05-12 MED ORDER — IPRATROPIUM-ALBUTEROL 0.5-2.5 (3) MG/3ML IN SOLN: 3.0000 mL | Freq: Three times a day (TID) | RESPIRATORY_TRACT | Status: DC

## 2017-05-12 MED ORDER — PREDNISONE 20 MG PO TABS
60.0000 mg | ORAL_TABLET | Freq: Every day | ORAL | 0 refills | Status: DC
Start: 2017-05-12 — End: 2017-06-12

## 2017-05-12 MED ORDER — IPRATROPIUM-ALBUTEROL 0.5-2.5 (3) MG/3ML IN SOLN: 3.0000 mL | Freq: Four times a day (QID) | RESPIRATORY_TRACT | 0 refills | Status: AC | PRN

## 2017-05-12 NOTE — Discharge Summary (Signed)
Physician Discharge Summary  [name] [id number] DOB: [birth year] DOA: 05/11/2017  PCP: Donnamae Jude, MD  Admit date: 05/11/2017 Discharge date: 05/12/2017  Admitted From: Home Disposition: Home   Recommendations for Outpatient Follow-up:  1. Follow up with PCP in 1-2 weeks  Home Health: None Equipment/Devices: Nebulizer Discharge Condition: Stable CODE STATUS: DNR Diet recommendation: heart healthy  Brief/Interim Summary: Brianna [name] is a 83 y.o. female with a past medical history of chronic systolic congestive heart failure with EF of 25-30% based on an echocardiogram done in June, history of AICD placement, COPD on home oxygen at 2lpm, who only recently stopped smoking. She was in her usual state of health until a few days ago when she started experiencing shortness of breath with minimal exertion. She also experienced some chest pain but that has been ongoing for a month. She denies any weight changes recently. She tells me that she has been checking her weight and her weight is usually between 131-135 pounds. She did weigh 144 pounds back in September at her cardiologist's office. She has been taking her Lasix twice a day on Mondays and Fridays. She has noticed some leg swelling but not significantly so. She's had a cough which has been dry for the most part. Denies any fever, chills. No sick contacts. Has noticed some wheezing.  In the emergency department, patient was found to have elevated BNP. She was thought to have acute CHF exacerbation. IV lasix was given with good diuresis, though creatinine bumped and he weight was found to be below her presumed dry weight. Wheezing resolved and she has returned to her respiratory baseline.   Discharge Diagnoses:  Principal Problem:   Acute on chronic systolic congestive heart failure (HCC) Active Problems:   Essential hypertension   Automatic implantable cardioverter-defibrillator in situ   Asthma, chronic   COPD  (chronic obstructive pulmonary disease) (HCC)   Acute systolic CHF (congestive heart failure) (HCC)  Acute on chronic hypoxic respiratory failure: Suspect due to acute bronchitis, possibly also some acute systolic CHF on top of COPD and interstitial fibrosis noted on previous CT scan. - Treated conditions as below with improvement.  - Does not technically qualify for home oxygen prior to discharge, but is advised to continue this symptomatically (has it at home).   Acute bronchitis/COPD exacerbation:  - Continue steroids, breathing treatments including advair and duoneb (ordered nebulizer for home).  Acute systolic CHF, nonischemic cardiomyopathy: Troponins negative, ECG with stable, known LBBB. No chest pain at discharge.  - Continued metoprolol tartrate (consider change to metoprolol succinate if BP could tolerate). Not on ACE/ARB due to intolerance (hypotension).  - Restart home intermittent lasix dosing (received IV lasix x2 here) - Follow up with Dr. Sallyanne Kuster as needed  History of tobacco abuse: She quit about a month ago. She was congratulated.  Discharge Instructions Discharge Instructions    (HEART FAILURE PATIENTS) Call MD:  Anytime you have any of the following symptoms: 1) 3 pound weight gain in 24 hours or 5 pounds in 1 week 2) shortness of breath, with or without a dry hacking cough 3) swelling in the hands, feet or stomach 4) if you have to sleep on extra pillows at night in order to breathe.    Complete by:  As directed    Call MD for:  difficulty breathing, headache or visual disturbances    Complete by:  As directed    Call MD for:  temperature >100.4    Complete by:  As  directed    Diet - low sodium heart healthy    Complete by:  As directed    Discharge instructions    Complete by:  As directed    You were admitted for acute breathing problems related to your COPD and bronchitis, possibly with heart failure also contributing. You seem to have improved with treatment  and you may continue treatment at home.  - Start taking prednisone daily for 5 days - After review of your chest xray, you do not need an antibiotic - Start using a nebulizer to take duonebs every 6 hours (at first around the clock, then after about 48 hours, you may start taking them as needed only) - Continue not smoking. Congratulation by the way! - If your symptoms worsen, seek medical attention right away. Otherwise call on Monday to schedule a follow up appointment with Dr. Kennon Rounds in the next 2 weeks or so.   Increase activity slowly    Complete by:  As directed      Allergies as of 05/12/2017      Reactions   Atorvastatin Other (See Comments)   Reaction:  Fatigue    Claritin [loratadine] Other (See Comments)   Reaction:  Fatigue    Crestor [rosuvastatin] Other (See Comments)   Reaction:  Muscle pain       Medication List    TAKE these medications   ADVAIR DISKUS 100-50 MCG/DOSE Aepb Generic drug:  Fluticasone-Salmeterol INHALE 1 PUFF INTO THE LUNGS 2 (TWO) TIMES DAILY.   albuterol 108 (90 Base) MCG/ACT inhaler Commonly known as:  PROAIR HFA Inhale 2 puffs into the lungs every 6 (six) hours as needed for wheezing or shortness of breath.   aspirin 81 MG chewable tablet Chew 81 mg by mouth daily.   feeding supplement (ENSURE ENLIVE) Liqd Take 237 mLs by mouth 2 (two) times daily between meals.   FLUoxetine 10 MG capsule Commonly known as:  PROZAC Take 10 mg by mouth daily.   furosemide 20 MG tablet Commonly known as:  LASIX Take 1 tablet (20 mg total) by mouth every other day AS DIRECTED. What changed:  how much to take  how to take this  when to take this  additional instructions   guaiFENesin 600 MG 12 hr tablet Commonly known as:  MUCINEX Take 600 mg by mouth 2 (two) times daily as needed for to loosen phlegm.   ipratropium-albuterol 0.5-2.5 (3) MG/3ML Soln Commonly known as:  DUONEB Take 3 mLs by nebulization every 6 (six) hours as needed (wheezing,  shortness of breath).   KLOR-CON M20 20 MEQ tablet Generic drug:  potassium chloride SA TAKE 1 TABLET BY MOUTH EVERY DAY   metoprolol tartrate 25 MG tablet Commonly known as:  LOPRESSOR TAKE 0.5 TABLETS (12.5 MG TOTAL) BY MOUTH 2 (TWO) TIMES DAILY.   multivitamin with minerals Tabs tablet Take 1 tablet by mouth daily.   polyethylene glycol packet Commonly known as:  MIRALAX / GLYCOLAX Take 17 g by mouth daily as needed for mild constipation.   predniSONE 20 MG tablet Commonly known as:  DELTASONE Take 3 tablets (60 mg total) by mouth daily with breakfast.      Follow-up Information    Donnamae Jude, MD. Schedule an appointment as soon as possible for a visit.   Specialty:  Obstetrics and Gynecology Contact information: [address] [place] [address] [phone]          Allergies  Allergen Reactions  . Atorvastatin Other (See Comments)  Reaction:  Fatigue   . Claritin [Loratadine] Other (See Comments)    Reaction:  Fatigue   . Crestor [Rosuvastatin] Other (See Comments)    Reaction:  Muscle pain     Consultations:  None  Procedures/Studies: Dg Chest Port 1 View  Result Date: 05/11/2017 CLINICAL DATA:  Increased weakness, shortness of breath EXAM: PORTABLE CHEST 1 VIEW COMPARISON:  02/09/2017 FINDINGS: Bilateral diffuse mild interstitial thickening. No pleural effusion or pneumothorax. Mild stable cardiomegaly. Dual lead cardiac pacemaker. No acute osseous abnormality. IMPRESSION: Cardiomegaly with mild pulmonary vascular congestion. Electronically Signed   By: Kathreen Devoid   On: 05/11/2017 12:23   Subjective: Feels much better. Less weak than before. Breathing more easily. Has not been out of bed yet. Has 24-hr care at home but no nebulizer.   Discharge Exam: BP 110/80 (BP Location: Right Arm)   Pulse 77   Temp 97.8 F (36.6 C) (Oral)   Resp 18   Ht 5\' 5"  (1.651 m)   Wt 63.2 kg (139 lb 5.3 oz)   SpO2 94%   BMI 23.19 kg/m   General:  Chronically ill-appearing elderly female in no distress. Cracking jokes throughout interview.  Cardiovascular: S1/S2, no murmur, gallop. No JVD. No LE edema.  Respiratory: Nonlabored on room air at rest, diffuse crackles in all lung fields, not changed at bases. No wheezes.  Abdominal: Soft, NT, ND, bowel sounds +  Labs: BNP (last 3 results)  Recent Labs  02/08/17 1114 05/11/17 1138 05/12/17 0455  BNP 424.3* 1,325.7* 867.6*   Basic Metabolic Panel:  Recent Labs Lab 05/11/17 1138 05/12/17 0455  NA 139 135  K 4.4 4.4  CL 104 100*  CO2 26 25  GLUCOSE 98 154*  BUN 11 23*  CREATININE 0.80 1.06*  CALCIUM 8.9 9.1   Liver Function Tests:  Recent Labs Lab 05/11/17 1138  AST 25  ALT 11*  ALKPHOS 76  BILITOT 0.8  PROT 7.6  ALBUMIN 3.7   CBC:  Recent Labs Lab 05/11/17 1138  WBC 8.8  NEUTROABS 5.7  HGB 12.4  HCT 37.9  MCV 91.3  PLT 222   Cardiac Enzymes:  Recent Labs Lab 05/11/17 1706 05/12/17 0455  TROPONINI <0.03 <0.03   Time coordinating discharge: Approximately 40 minutes  Vance Gather, MD  [facility] 05/13/2017, 3:42 PM Pager [phone]

## 2017-05-12 NOTE — Progress Notes (Signed)
PT Cancellation Note  Patient Details Name: Brianna [name] MRN: [id number] DOB: [birth year]   Cancelled Treatment:    Reason Eval/Treat Not Completed: PT screened, no needs identified, will sign offRN ambulating the patient. To Dc todat.   Claretha Cooper 05/12/2017, 3:34 PM Tresa Endo PT [phone]

## 2017-05-12 NOTE — Care Management Note (Signed)
Case Management Note  Patient Details  Name: Brianna [name] MRN: [id number] Date of Birth: [birth year]  Subjective/Objective:      CHF              Action/Plan: Discharge Planning:  NCM spoke to pt's dtr and son in law at bedside. Pt has RW, bedside commode and oxygen ([facility]) at home. Has 24 hour care with [facility]. Contacted [facility] for neb machine for home. Had HHPT in the past with [facility].   PCP Donnamae Jude MD   Expected Discharge Date:  05/12/17               Expected Discharge Plan:  Home/Self Care  In-House Referral:  NA  Discharge planning Services  CM Consult  Post Acute Care Choice:  NA Choice offered to:  NA  DME Arranged:  Nebulizer machine DME Agency:  [place] Arranged:  NA [place] Agency:  NA  Status of Service:  Completed, signed off  If discussed at [place] of Stay Meetings, dates discussed:    Additional Comments:  Erenest Rasher, RN 05/12/2017, 3:27 PM

## 2017-05-12 NOTE — Care Management Obs Status (Signed)
[place] NOTIFICATION   Patient Details  Name: Brianna [name] MRN: [id number] Date of Birth: [birth year]   Medicare Observation Status Notification Given:  Yes    Erenest Rasher, RN 05/12/2017, 3:26 PM

## 2017-05-12 NOTE — Progress Notes (Signed)
OT Cancellation Note  Patient Details Name: Brianna [name] MRN: [id number] DOB: 03/18/1930   Cancelled Treatment:    Reason Eval/Treat Not Completed: Other (comment) Note pt up with nursing ambulating in hallway. Pt supposed to d/c later today. Appears pt is at baseline.  [name] 05/12/2017, 4:13 PM

## 2017-05-12 NOTE — Progress Notes (Signed)
SATURATION QUALIFICATIONS: (This note is used to comply with regulatory documentation for home oxygen)  Patient Saturations on Room Air at Rest = 97%  Patient Saturations on Room Air while Ambulating = 94%  

## 2017-05-18 ENCOUNTER — Other Ambulatory Visit: Payer: Self-pay | Admitting: Family Medicine

## 2017-05-21 ENCOUNTER — Other Ambulatory Visit: Payer: Self-pay | Admitting: Family Medicine

## 2017-05-21 DIAGNOSIS — Z1231 Encounter for screening mammogram for malignant neoplasm of breast: Secondary | ICD-10-CM

## 2017-05-30 ENCOUNTER — Emergency Department (HOSPITAL_COMMUNITY)
Admission: EM | Admit: 2017-05-30 | Discharge: 2017-05-30 | Disposition: A | Discharge status: Home / Self Care | Payer: Medicare Other | Attending: Emergency Medicine | Admitting: Emergency Medicine

## 2017-05-30 ENCOUNTER — Emergency Department (HOSPITAL_COMMUNITY): Payer: Medicare Other

## 2017-05-30 ENCOUNTER — Encounter: Payer: Self-pay | Admitting: Family Medicine

## 2017-05-30 ENCOUNTER — Ambulatory Visit: Payer: Medicare Other | Admitting: Family Medicine

## 2017-05-30 DIAGNOSIS — R0602 Shortness of breath: Secondary | ICD-10-CM | POA: Diagnosis not present

## 2017-05-30 DIAGNOSIS — Z79810 Long term (current) use of selective estrogen receptor modulators (SERMs): Secondary | ICD-10-CM | POA: Diagnosis not present

## 2017-05-30 DIAGNOSIS — J45909 Unspecified asthma, uncomplicated: Secondary | ICD-10-CM | POA: Diagnosis not present

## 2017-05-30 DIAGNOSIS — Z95810 Presence of automatic (implantable) cardiac defibrillator: Secondary | ICD-10-CM | POA: Diagnosis not present

## 2017-05-30 DIAGNOSIS — I509 Heart failure, unspecified: Secondary | ICD-10-CM | POA: Diagnosis not present

## 2017-05-30 DIAGNOSIS — F17210 Nicotine dependence, cigarettes, uncomplicated: Secondary | ICD-10-CM | POA: Insufficient documentation

## 2017-05-30 DIAGNOSIS — I5043 Acute on chronic combined systolic (congestive) and diastolic (congestive) heart failure: Secondary | ICD-10-CM | POA: Insufficient documentation

## 2017-05-30 DIAGNOSIS — I10 Essential (primary) hypertension: Secondary | ICD-10-CM | POA: Diagnosis not present

## 2017-05-30 DIAGNOSIS — J441 Chronic obstructive pulmonary disease with (acute) exacerbation: Secondary | ICD-10-CM | POA: Diagnosis not present

## 2017-05-30 DIAGNOSIS — Z7982 Long term (current) use of aspirin: Secondary | ICD-10-CM | POA: Insufficient documentation

## 2017-05-30 DIAGNOSIS — I110 Hypertensive heart disease with heart failure: Secondary | ICD-10-CM | POA: Diagnosis not present

## 2017-05-30 DIAGNOSIS — Z79899 Other long term (current) drug therapy: Secondary | ICD-10-CM | POA: Insufficient documentation

## 2017-05-30 LAB — CBC WITH DIFFERENTIAL/PLATELET
BASOS PCT: 0 %
Basophils Absolute: 0 10*3/uL (ref 0.0–0.1)
EOS ABS: 0.3 10*3/uL (ref 0.0–0.7)
Eosinophils Relative: 5 %
HCT: 38 % (ref 36.0–46.0)
Hemoglobin: 12 g/dL (ref 12.0–15.0)
LYMPHS ABS: 1.6 10*3/uL (ref 0.7–4.0)
Lymphocytes Relative: 24 %
MCH: 29.2 pg (ref 26.0–34.0)
MCHC: 31.6 g/dL (ref 30.0–36.0)
MCV: 92.5 fL (ref 78.0–100.0)
MONO ABS: 0.4 10*3/uL (ref 0.1–1.0)
MONOS PCT: 7 %
Neutro Abs: 4.2 10*3/uL (ref 1.7–7.7)
Neutrophils Relative %: 64 %
Platelets: 183 10*3/uL (ref 150–400)
RBC: 4.11 MIL/uL (ref 3.87–5.11)
RDW: 14.6 % (ref 11.5–15.5)
WBC: 6.6 10*3/uL (ref 4.0–10.5)

## 2017-05-30 LAB — BASIC METABOLIC PANEL
Anion gap: 6 (ref 5–15)
BUN: 14 mg/dL (ref 6–20)
CALCIUM: 8.7 mg/dL — AB (ref 8.9–10.3)
CO2: 28 mmol/L (ref 22–32)
Chloride: 104 mmol/L (ref 101–111)
Creatinine, Ser: 0.98 mg/dL (ref 0.44–1.00)
GFR calc Af Amer: 60 mL/min — ABNORMAL LOW (ref >60)
GFR, EST NON AFRICAN AMERICAN: 52 mL/min — AB (ref >60)
GLUCOSE: 103 mg/dL — AB (ref 65–99)
POTASSIUM: 4.5 mmol/L (ref 3.5–5.1)
Sodium: 138 mmol/L (ref 135–145)

## 2017-05-30 LAB — BRAIN NATRIURETIC PEPTIDE: B Natriuretic Peptide: 949.2 pg/mL — ABNORMAL HIGH (ref 0.0–100.0)

## 2017-05-30 LAB — I-STAT TROPONIN, ED: TROPONIN I, POC: 0.01 ng/mL (ref 0.00–0.08)

## 2017-05-30 MED ORDER — FUROSEMIDE 10 MG/ML IJ SOLN
20.0000 mg | Freq: Once | INTRAMUSCULAR | Status: AC
  Administered 2017-05-30: 20 mg via INTRAVENOUS
  Filled 2017-05-30: qty 2

## 2017-05-30 MED ORDER — PREDNISONE 20 MG PO TABS
40.0000 mg | ORAL_TABLET | Freq: Once | ORAL | Status: AC
  Administered 2017-05-30: 40 mg via ORAL
  Filled 2017-05-30: qty 2

## 2017-05-30 MED ORDER — METHYLPREDNISOLONE SODIUM SUCC 125 MG IJ SOLR
125.0000 mg | Freq: Once | INTRAMUSCULAR | Status: AC
  Administered 2017-05-30: 125 mg via INTRAVENOUS
  Filled 2017-05-30: qty 2

## 2017-05-30 MED ORDER — IPRATROPIUM-ALBUTEROL 0.5-2.5 (3) MG/3ML IN SOLN
3.0000 mL | Freq: Once | RESPIRATORY_TRACT | Status: AC
  Administered 2017-05-30: 3 mL via RESPIRATORY_TRACT
  Filled 2017-05-30: qty 3

## 2017-05-30 NOTE — Discharge Instructions (Signed)
1.  Take a 20 mg dose of Lasix  today at 3 PM.  2.  You have an appointment with your family doctor for recheck tomorrow, [phone] at 2:10 PM, arrive 10 minutes early.  3.  Where your 2 L of oxygen continuously.  4.  Continue using your nebulizer solution every 6 hours.

## 2017-05-30 NOTE — ED Triage Notes (Signed)
Patient arrived with EMS from home woke up this morning with SOB , chest congestion/wheezing , home health nurse administered nebulizer treatment with no relief , EMS gave Duoneb and Albuterol 5 mg/Atrovent .5 mg prior to arrival - CPAP applied by EMS . BIPAP applied by RT at arrival .

## 2017-05-30 NOTE — ED Notes (Signed)
ED Provider at bedside. 

## 2017-05-30 NOTE — ED Notes (Addendum)
Attempted to ambulate patient around room on RA. sats 89-90 with obvious weakness and unsteadiness. Wheezing noted immediately followed brief ambulation attempt. Verified with patient that she does not wear oxygen at home

## 2017-05-30 NOTE — Progress Notes (Signed)
Patient did not keep appointment today. She has an appointment tomorrow. Seen in ED today.

## 2017-05-30 NOTE — ED Provider Notes (Signed)
[facility] EMERGENCY DEPARTMENT Provider Note   CSN: [id number] Arrival date & time: 05/30/17  4196     History   Chief Complaint Chief Complaint  Patient presents with  . Shortness of Breath  . Wheezing    HPI AYDEN APODACA is a 83 y.o. female.  HPI Patient has history of congestive heart failure and COPD.  She chronically wears 3 L of nasal cannula.  Patient's home health aide reports she has been giving her DuoNeb every 6 hours.  She reports however the patient became more short of breath last night and by this morning was quite short of breath.  Denies she had any chest pain.  There is been no documented fever increased cough or lower extremity swelling.  She was hospitalized about 3 weeks ago for CHF exacerbation.  Is brought in by EMS on BiPAP.  Patient is currently on BiPAP during my interview and reports she is significantly improved compared to earlier this morning. Past Medical History:  Diagnosis Date  . AICD (automatic cardioverter/defibrillator) present    a. 05/2010 MDT D33VRG Protecta VR single lead AICD (ser # [id number]).  Marland Kitchen Anxiety   . Arthritis    "lower back; knees" (12/20/2016)  . Cancer of left breast ([place])   . CHF (congestive heart failure) ([place])   . Depression   . Emphysema of lung ([place])   . Falls frequently    12/20/2016 "couple falls since 07/2016"  . Heart murmur   . HFrEF (heart failure with reduced ejection fraction) ([place])    a. 12/2015 Echo: EF 40-45%;  b. 12/2016 Echo: EF 25-30%, diff HK.  Marland Kitchen Hyperlipidemia   . Hypertension   . Nonischemic cardiomyopathy ([place])    a. Felt to be 2/2 ChemoRx for Breast CA;  b. 2006 s/p AICD;  c. 05/2010 ICD upgrade: MDT D33VRG Protecta VR single lead AICD (ser # [id number]);  d. 12/2015 Echo: EF 40-45%;  e. 12/2016 Echo: EF 25-30%, diff HK, mild MR, PASP 45mmHg.  . Osteopenia   . Paroxysmal atrial fibrillation ([place])    a. Previously noted on device interrogation for brief periods. No OAC 2/2  falls/frailty.  . Pneumonia ~ 1448-1856 X 3    Patient Active Problem List   Diagnosis Date Noted  . Acute systolic CHF (congestive heart failure) ([place]) 05/11/2017  . Syncope and collapse 02/08/2017  . Right hip pain 02/08/2017  . Chronic systolic CHF (congestive heart failure) ([place]) 02/08/2017  . Chronic combined systolic and diastolic CHF (congestive heart failure) ([place]) 02/08/2017  . Hypokalemia 12/27/2016  . LBBB (left bundle branch block) 12/23/2016  . Atrial fibrillation, new onset ([place]) 12/23/2016  . COPD exacerbation ([place])   . Acute on chronic systolic congestive heart failure ([place])   . Hypoxia 12/20/2016  . Fracture of right humerus 12/29/2015  . Syncope 12/28/2015  . COPD (chronic obstructive pulmonary disease) ([place]) 06/10/2014  . Asthma, chronic 05/19/2014  . Constipation 05/19/2014  . Major depression, chronic 05/19/2014  . Protein-calorie malnutrition, severe ([place]) 05/12/2014  . Hip fracture, intertrochanteric ([place]) 05/10/2014  . NICM (nonischemic cardiomyopathy) ([place]) 03/24/2014  . Dyspnea 08/03/2013  . Automatic implantable cardioverter-defibrillator in situ 04/16/2013  . CYSTOCELE WITHOUT MENTION UTERINE PROLAPSE LAT 10/12/2008  . History of breast cancer 11/05/2007  . INSOMNIA-SLEEP DISORDER-UNSPEC 07/10/2007  . HYPERCHOLESTEROLEMIA 09/20/2006  . Essential hypertension 09/20/2006  . VENTRICULAR TACHYCARDIA 09/20/2006  . Chronic diastolic heart failure ([place]) 09/20/2006  . PSORIASIS 09/20/2006  . Osteoporosis 09/20/2006    Past Surgical  History:  Procedure Laterality Date  . APPENDECTOMY  1943  . BREAST BIOPSY Left  [id number]  . CARDIAC CATHETERIZATION  12/18/2002   Normal coronary arteries and normal LV function  . CARDIAC DEFIBRILLATOR PLACEMENT  10/13/2004   Medtronic Maximo VR, model [id number], serial [id number]  . CATARACT EXTRACTION W/ INTRAOCULAR LENS  IMPLANT, BILATERAL    . FRACTURE SURGERY    . MASTECTOMY Left  [id number]  . PACEMAKER INSERTION   06/17/2010   Medtronic Protecta VR, model #D334VRG, serial [id number]  . TONSILLECTOMY  1940  . TUBAL LIGATION      OB History    No data available       Home Medications    Prior to Admission medications   Medication Sig Start Date End Date Taking? Authorizing Provider  ADVAIR DISKUS 100-50 MCG/DOSE AEPB INHALE 1 PUFF INTO THE LUNGS 2 (TWO) TIMES DAILY. 10/08/16  Yes Donnamae Jude, MD  albuterol (PROAIR HFA) 108 (90 Base) MCG/ACT inhaler Inhale 2 puffs into the lungs every 6 (six) hours as needed for wheezing or shortness of breath. 12/01/16  Yes Ivar Drape D, PA  aspirin 81 MG chewable tablet Chew 81 mg by mouth daily.   Yes [provider]  FLUoxetine (PROZAC) 10 MG capsule Take 10 mg by mouth daily. 04/28/17  Yes [provider]  furosemide (LASIX) 20 MG tablet Take 1 tablet (20 mg total) by mouth every other day AS DIRECTED. Patient taking differently: Take 20 mg every other day by mouth.  03/28/17  Yes Croitoru, Mihai, MD  guaiFENesin (MUCINEX) 600 MG 12 hr tablet Take 600 mg by mouth 2 (two) times daily as needed for to loosen phlegm.   Yes [provider]  ipratropium-albuterol (DUONEB) 0.5-2.5 (3) MG/3ML SOLN Take 3 mLs by nebulization every 6 (six) hours as needed (wheezing, shortness of breath). 05/12/17  Yes Patrecia Pour, MD  KLOR-CON M20 20 MEQ tablet TAKE 1 TABLET BY MOUTH EVERY DAY 05/18/17  Yes Donnamae Jude, MD  metoprolol tartrate (LOPRESSOR) 25 MG tablet TAKE 0.5 TABLETS (12.5 MG TOTAL) BY MOUTH 2 (TWO) TIMES DAILY. Patient taking differently: Take 12.5 mg at bedtime by mouth.  04/19/17  Yes Donnamae Jude, MD  Multiple Vitamin (MULTIVITAMIN WITH MINERALS) TABS tablet Take 1 tablet by mouth daily.   Yes [provider]  polyethylene glycol (MIRALAX / GLYCOLAX) packet Take 17 g by mouth daily as needed for mild constipation. 02/09/17  Yes Sheikh, Keyes, DO  feeding supplement, ENSURE ENLIVE, (ENSURE ENLIVE) LIQD Take 237  mLs by mouth 2 (two) times daily between meals. 02/09/17   Raiford Noble Latif, DO  predniSONE (DELTASONE) 20 MG tablet Take 3 tablets (60 mg total) by mouth daily with breakfast. 05/12/17   Patrecia Pour, MD    Family History Family History  Problem Relation Age of Onset  . Heart disease Mother        Pacemaker  . Heart disease Father        No details.    . Hyperlipidemia Sister   . Heart disease Brother        "Heart Attacks"  Died age 31  . Cancer Daughter        Oral cancer and "knot" on right cheek that was cancer    Social History Social History   Tobacco Use  . Smoking status: Light Tobacco Smoker    Packs/day: 0.10    Years: 44.00    Pack years: 4.40  Types: Cigarettes  . Smokeless tobacco: Never Used  Substance Use Topics  . Alcohol use: Yes    Alcohol/week: 3.0 oz    Types: 5 Glasses of wine per week  . Drug use: No     Allergies   Atorvastatin; Claritin [loratadine]; and Crestor [rosuvastatin]   Review of Systems Review of Systems 10 Systems reviewed and are negative for acute change except as noted in the HPI.   Physical Exam Updated Vital Signs BP (!) 132/105   Pulse 76   Temp (!) 96.8 F (36 C) (Temporal)   Resp (!) 25   SpO2 100%   Physical Exam  Constitutional: She appears well-developed and well-nourished.  Patient is alert and appropriately interactive.  She is wearing BiPAP mask.  Mild increased work of breathing with mask.  HENT:  Head: Normocephalic and atraumatic.  Eyes: Conjunctivae and EOM are normal.  Neck: Neck supple.  Cardiovascular: Normal rate and regular rhythm.  No murmur heard. Pulmonary/Chest: No respiratory distress.  Mild increased work of breathing.  Crackles at lung bases.  No gross rhonchi or wheeze  Abdominal: Soft. There is no tenderness.  Musculoskeletal: She exhibits no edema or tenderness.  Neurological: She is alert. No cranial nerve deficit. She exhibits normal muscle tone. Coordination normal.  Skin:  Skin is warm and dry.  Psychiatric: She has a normal mood and affect.  Nursing note and vitals reviewed.    ED Treatments / Results  Labs (all labs ordered are listed, but only abnormal results are displayed) Labs Reviewed  BRAIN NATRIURETIC PEPTIDE - Abnormal; Notable for the following components:      Result Value   B Natriuretic Peptide 949.2 (*)    All other components within normal limits  BASIC METABOLIC PANEL - Abnormal; Notable for the following components:   Glucose, Bld 103 (*)    Calcium 8.7 (*)    GFR calc non Af Amer 52 (*)    GFR calc Af Amer 60 (*)    All other components within normal limits  CBC WITH DIFFERENTIAL/PLATELET  I-STAT TROPONIN, ED    EKG  EKG Interpretation  Date/Time:  Wednesday May 30 2017 07:02:56 EST Ventricular Rate:  72 PR Interval:    QRS Duration: 134 QT Interval:  427 QTC Calculation: 468 R Axis:   42 Text Interpretation:  Sinus rhythm Borderline short PR interval Left bundle branch block no change from previous Confirmed by Charlesetta Shanks [id number]) on 05/30/2017 7:32:52 AM       Radiology Dg Chest Port 1 View  Result Date: 05/30/2017 CLINICAL DATA:  Shortness of breath. Chest congestion and wheezing this morning. EXAM: PORTABLE CHEST 1 VIEW COMPARISON:  None. FINDINGS: Stable enlarged cardiac silhouette and diffusely prominent interstitial markings. Stable right subclavian AICD leads. Diffuse osteopenia and old, healed proximal right humerus fracture. IMPRESSION: Stable cardiomegaly and probable combination of chronic interstitial lung disease and interstitial edema or pneumonitis. Electronically Signed   By: Claudie Revering M.D.   On: 05/30/2017 07:59    Procedures Procedures (including critical care time)  Medications Ordered in ED Medications  predniSONE (DELTASONE) tablet 40 mg (not administered)  furosemide (LASIX) injection 20 mg (20 mg Intravenous Given 05/30/17 0736)  methylPREDNISolone sodium succinate (SOLU-MEDROL)  125 mg/2 mL injection 125 mg (125 mg Intravenous Given 05/30/17 0736)  ipratropium-albuterol (DUONEB) 0.5-2.5 (3) MG/3ML nebulizer solution 3 mL (3 mLs Nebulization Given 05/30/17 0841)     Initial Impression / Assessment and Plan / ED Course  I have reviewed the  triage vital signs and the nursing notes.  Pertinent labs & imaging results that were available during my care of the patient were reviewed by me and considered in my medical decision making (see chart for details).     Consult: Have reviewed with Dr. Brett Albino.  She will schedule the patient for recheck tomorrow. Final Clinical Impressions(s) / ED Diagnoses   Final diagnoses:  Shortness of breath  COPD exacerbation (HCC)  Acute on chronic congestive heart failure, unspecified heart failure type [facility])  Patient is much improved after treatment.  She reports she feels back to baseline.  At baseline she does have very limited function due to COPD and congestive heart failure.  Patient has a 24 7 in-home caregiver.  Caregiver reports patient is now at baseline respiratory status.  Patient is adamant that she wants to go back home.  Caregiver will continue to provide every 6-hour nebulizer therapy.  Patient is encouraged to use her home oxygen 2 L which she does not use very much per her home care provider.  She will be advised to take an additional 20 mg dose of Lasix this afternoon, 20 mg IV was given in the emergency department.  At this time, patient is stable for continued home management with known significant respiratory disability due to COPD and CHF.  ED Discharge Orders    None       Charlesetta Shanks, MD 05/30/17 1123

## 2017-05-30 NOTE — ED Notes (Signed)
Nursing assistant from agency sitting at bedside with patient. Wick placed to catch urine. Patient continues to deny pain. Alert and oriented.

## 2017-05-31 ENCOUNTER — Ambulatory Visit (INDEPENDENT_AMBULATORY_CARE_PROVIDER_SITE_OTHER): Payer: Medicare Other | Admitting: Family Medicine

## 2017-05-31 ENCOUNTER — Encounter: Payer: Self-pay | Admitting: Family Medicine

## 2017-05-31 DIAGNOSIS — R0600 Dyspnea, unspecified: Secondary | ICD-10-CM | POA: Diagnosis not present

## 2017-05-31 NOTE — Progress Notes (Signed)
Subjective:    Patient ID: Brianna [name] , female   DOB: July 28, 1929 , 83 y.o..   MRN: [id number]  HPI  Brianna [name] is a 83 yo F with PMH of HFrEF 25-30%, chronic respiratory failure secondary to COPD on 3 L O2 at home here for  Chief Complaint  Patient presents with  . ED follow up for SOB    1.  Dyspnea: Patient is here with her 24/7 home health aide who provides some history.  Patient is a poor historian. Patient was seen in the emergency department yesterday for increasing dyspnea.  She notes that she has been more short of breath over the last month however worsening over the last couple days.  She is to the point where she gets out of breath even from walking to her bedroom to her bathroom which she states is not normal for her.  She was taken to the ED per EMS and was on BiPAP for a period of time.  She was given 20 mg IV Lasix and a DuoNeb which improved her symptoms. Also given steroids. She was instructed to take a nebulizer treatment every 6 hours continue home oxygen.  She was also instructed to take an extra pill of 20 mg Lasix.  No said she does have a cough but not any worse than usual.  Feels weaker than normal for the last couple weeks as well.  Of note, she began needing supplemental oxygen in June 2018 per epic review. Was recently hospitalized in mid October for acute on chronic hypoxic respiratory failure thought to be multifactorial from CHF and COPD.  Denies any fevers, chills, nausea, vomiting, diarrhea, constipation, edema.  Review of Systems: Per HPI.   Medications: reviewed   Social Hx:  reports that she has been smoking cigarettes.  She has a 4.40 pack-year smoking history. she has never used smokeless tobacco.   Objective:   BP 116/82   Pulse 70   Temp 97.9 F (36.6 C) (Oral)   Wt 146 lb (66.2 kg)   SpO2 97% Comment: on 3L  BMI 24.30 kg/m  Physical Exam  Gen: NAD, alert, cooperative with exam, elderly female in wheelchair HEENT: NCAT, PERRL,  clear conjunctiva, oropharynx clear, supple neck Cardiac: Regular rate and rhythm, normal S1/S2, no murmur, 1+ ankle edema bilaterally, capillary refill brisk  Respiratory: Slightly increased respiratory rate, left lung fields clear to auscultation, right lower lung field with crackles.  No wheezing. Gastrointestinal: soft, non tender, non distended, bowel sounds present Skin: no rashes, normal turgor  Neurological: no gross deficits.   Assessment & Plan:  Dyspnea Likely multifactorial with history of chronic respiratory failure on 3 L O2, COPD, heart failure with EF of 25-30%.  Patient appears to have some signs of fluid overload with crackles in the right lung and 1+ pitting edema in ankles, weight up from 139 on 10/20 to 146 today.  Appears to be slightly tachypneic but not in distress and speaking in full sentences.  Chest x-ray yesterday in ED showed no pleural effusions or infiltrates.  BNP was elevated to 949.  Patient does not know when she started home supplemental oxygen per epic review it appears to be since June 2018.  Initially on exam today she was 89% at 3 L, looked at her O2 tank and it was empty.  Home health aide replaced her O2 tank with new one and her O2 went up to 97% on 3L.  Reassuringly she is afebrile so not  worried about pneumonia.  Patient discussed with Dr. Andria Frames and the plan is as follows: -Continue Lasix 20 mg daily, add an extra pill this evening -Continue home supplemental oxygen at 3 L -With her significant heart failure I am unsure why she is not on an ACE or ARB, may consider adding this if there is not a compelling reason not to -Follow-up tomorrow morning in clinic to ensure dyspnea has not worsened and if it has we will likely need to admit to [facility] -Can consider workup for PE given her decreased mobility if there is no improvement -Check BMP tomorrow to ensure no AKI with increased dose of Lasix   Smitty Cords, MD [place],  PGY-3

## 2017-05-31 NOTE — Patient Instructions (Addendum)
Thank you for coming in today, it was so nice to see you! Today we talked about:    Heart failure: I think you are retaining some fluid which is causing your shortness of breath.   You will need to take an extra pill of Lasix (20 mg) tonight and follow up tomorrow  Wear your oxygen at 3 Liters all day and night  Please follow up tomorrow. Arrive at 8:30AM. You will be on stand by as our schedule is full, the first spot that opens up you will be placed into.   If you have any questions or concerns, please do not hesitate to call the office at [phone]. You can also message me directly via MyChart.   Sincerely,  Smitty Cords, MD

## 2017-05-31 NOTE — Assessment & Plan Note (Addendum)
Likely multifactorial with history of chronic respiratory failure on 3 L O2, COPD, heart failure with EF of 25-30%.  Patient appears to have some signs of fluid overload with crackles in the right lung and 1+ pitting edema in ankles, weight up from 139 on 10/20 to 146 today.  Appears to be slightly tachypneic but not in distress and speaking in full sentences.  Chest x-ray yesterday in ED showed no pleural effusions or infiltrates.  BNP was elevated to 949.  Patient does not know when she started home supplemental oxygen per epic review it appears to be since June 2018.  Initially on exam today she was 89% at 3 L, looked at her O2 tank and it was empty.  Home health aide replaced her O2 tank with new one and her O2 went up to 97% on 3L.  Reassuringly she is afebrile so not worried about pneumonia.  Patient discussed with Dr. Andria Frames and the plan is as follows: -Continue Lasix 20 mg daily, add an extra pill this evening -Continue home supplemental oxygen at 3 L -With her significant heart failure I am unsure why she is not on an ACE or ARB, may consider adding this if there is not a compelling reason not to -Follow-up tomorrow morning in clinic to ensure dyspnea has not worsened and if it has we will likely need to admit to [facility] -Can consider workup for PE given her decreased mobility if there is no improvement -Check BMP tomorrow to ensure no AKI with increased dose of Lasix

## 2017-06-01 ENCOUNTER — Ambulatory Visit (INDEPENDENT_AMBULATORY_CARE_PROVIDER_SITE_OTHER): Payer: Medicare Other | Admitting: Internal Medicine

## 2017-06-01 VITALS — BP 118/80 | HR 73 | Temp 98.1°F | Wt 148.0 lb

## 2017-06-01 DIAGNOSIS — R0600 Dyspnea, unspecified: Secondary | ICD-10-CM | POA: Diagnosis not present

## 2017-06-01 NOTE — Patient Instructions (Signed)
We will increase the lasix dose for the next 3 days.   Increase Lasix to 2 tablets (40mg  total) in the morning for Saturday, Sunday. Go back to one pill on Monday.  She can take an extra pill today. Please monitor her blood pressure closely over the weekend.  Please follow up in clinic on Monday  If her symptoms worsen over the weekend, please take her to the hospital

## 2017-06-01 NOTE — Progress Notes (Signed)
[facility] Phone: [phone]   Date of Visit: 06/01/2017   HPI:  Dyspnea:  - Patient was initially seen in the ED on 11/7 for dyspnea.  Per chart review it looks like she required BiPAP initially.  She was given IV Lasix and nebulizer treatment and discharged with close follow-up as outpatient. -She was seen in clinic on 11/08 for ED follow-up.  At that time she was noted to be slightly fluid up with crackles at her right lung base and 1+ ankle edema.  Her usual home Lasix dose is 20 mg daily.  She was instructed to take an extra pill  that evening.  She was also instructed to come to clinic for follow-up the next day.  ED labs included BNP of 949. -She comes for follow-up today with her caregiver.  She has a caregiver at home 24/7.  She is a poor historian but she reports that she feels better.  When asked to elaborate she says that she rested better last night.  Her caregiver reports that she is still very dyspneic on exertion.  Since her symptoms started she has been mainly using a wheelchair due to her dyspnea on exertion.  Usually she is able to walk without any symptoms.  Caregiver reports the symptoms have been stable from yesterday.  Caregiver also reports that the patient woke up yesterday night from her sleep and she seemed short of breath and confused.  She was asking about seeing her son who she said was outside the house.  Caregiver reports that her breathing improved with a nebulizer treatment. -Patient denies any chest pain or orthopnea. -Per chart review her weight today is 148 pounds from 146 pounds yesterday.  On 10/20 she was 139 pounds.  Her caregiver reports that she usually weighs 145-146 pounds.  However in her chart there are provider notes in the past that report a weight of 131-135.  Little difficult to determine her dry weight.  ROS: See HPI.  [place]:   Heart failure with reduced ejection fraction COPD Chronic Respiratory Failure  PHYSICAL  EXAM: BP 118/80   Pulse 73   Temp 98.1 F (36.7 C) (Oral)   Wt 148 lb (67.1 kg)   SpO2 97% Comment: on 3L  BMI 24.63 kg/m  GEN: NAD, nontoxic, frail, sitting in wheelchair Neck No JVD noted but difficult exam. CV: RRR, no murmurs, rubs, or gallops PULM: Slightly tachypneic, 97% oxygen saturation on home 3 L.  94% oxygen saturation with ambulation on home 3 L.  Lungs with crackles bilaterally to about mid lung.  No wheezing. ABD: Soft, nontender, nondistended, NABS, no organomegaly SKIN: No rash or cyanosis; warm and well-perfused EXTR: No lower extremity edema or calf tenderness PSYCH: Mood and affect euthymic, normal rate and volume of speech NEURO: Awake, alert, no gross focal deficits  normal speech   ASSESSMENT/PLAN:  Dyspnea: Clinically, she does seem slightly fluid overloaded due to her lung exam.  No lower extremity swelling noted.  She has normal saturations on her home 3 L at rest and with ambulation.  She did not take the increased dose of PO Lasix as instructed yesterday.  Preceptor it with attending.  Plan is to increase Lasix to 40 mg daily for Saturday and Sunday.  Patient to take an extra pill today as she took 20 mg this morning.  Patient is to return to 20 mg daily on Monday.  Patient to follow-up in clinic on Monday for evaluation.  ED precautions  discussed.   Smiley Houseman, MD PGY [place]

## 2017-06-04 ENCOUNTER — Ambulatory Visit: Payer: Medicare Other | Admitting: Student

## 2017-06-12 ENCOUNTER — Encounter: Payer: Self-pay | Admitting: Student

## 2017-06-12 ENCOUNTER — Ambulatory Visit (INDEPENDENT_AMBULATORY_CARE_PROVIDER_SITE_OTHER): Payer: Medicare Other | Admitting: Student

## 2017-06-12 ENCOUNTER — Ambulatory Visit: Payer: Medicare Other

## 2017-06-12 ENCOUNTER — Other Ambulatory Visit: Payer: Self-pay

## 2017-06-12 VITALS — BP 110/68 | HR 76 | Temp 97.9°F | Ht 65.0 in | Wt 144.2 lb

## 2017-06-12 DIAGNOSIS — I5042 Chronic combined systolic (congestive) and diastolic (congestive) heart failure: Secondary | ICD-10-CM

## 2017-06-12 DIAGNOSIS — J441 Chronic obstructive pulmonary disease with (acute) exacerbation: Secondary | ICD-10-CM | POA: Diagnosis not present

## 2017-06-12 DIAGNOSIS — Z23 Encounter for immunization: Secondary | ICD-10-CM | POA: Diagnosis not present

## 2017-06-12 NOTE — Progress Notes (Signed)
Subjective:    Brianna [name] is a 83 y.o. old female here for hospital follow-up CHF and COPD.  HPI Patient was hospitalized 10/19-10/20 for COPD exacerbation.  She was treated with antibiotic, steroid burst and breathing treatments for COPD.  She was also diuresed with IV Lasix with good response and discharged home to follow-up with his PCP. Patient presented to ED on BiPAP by EMS on 11/7 due to shortness of breath and wheezing.  She quickly improved and weaned to home oxygen (3 L) by nasal cannula and discharged home. Today, she arrives in a good spirits with a caregiver.  She denies shortness of breath, cough, chest pain or swelling in her legs. She is on 3 L by nasal cannula.  She reports taking her Advair twice a day.  She also reports taking her Lasix every other day.  She weighs herself daily.  She says her weight is about 141-143 at home.   PMH/Problem List: has HYPERCHOLESTEROLEMIA; INSOMNIA-SLEEP DISORDER-UNSPEC; Essential hypertension; VENTRICULAR TACHYCARDIA; Chronic diastolic heart failure ([place]); CYSTOCELE WITHOUT MENTION UTERINE PROLAPSE LAT; PSORIASIS; Osteoporosis; History of breast cancer; Automatic implantable cardioverter-defibrillator in situ; Dyspnea; NICM (nonischemic cardiomyopathy) ([place]); Hip fracture, intertrochanteric ([place]); Protein-calorie malnutrition, severe ([place]); Asthma, chronic; Constipation; Major depression, chronic; COPD (chronic obstructive pulmonary disease) ([place]); Syncope; Fracture of right humerus; Hypoxia; COPD exacerbation ([place]); Acute on chronic systolic congestive heart failure (HCC); LBBB (left bundle branch block); Atrial fibrillation, new onset ([place]); Hypokalemia; Syncope and collapse; Right hip pain; Chronic systolic CHF (congestive heart failure) ([place]); Chronic combined systolic and diastolic CHF (congestive heart failure) ([place]); and Acute systolic CHF (congestive heart failure) ([place]) on their problem list.   has a past medical history of AICD (automatic  cardioverter/defibrillator) present, Anxiety, Arthritis, Cancer of left breast ([place]), CHF (congestive heart failure) ([place]), Depression, Emphysema of lung ([place]), Falls frequently, Heart murmur, HFrEF (heart failure with reduced ejection fraction) ([place]), Hyperlipidemia, Hypertension, Nonischemic cardiomyopathy ([place]), Osteopenia, Paroxysmal atrial fibrillation ([place]), and Pneumonia (~ 6073-7106 X 3).  FH:  Family History  Problem Relation Age of Onset  . Heart disease Mother        Pacemaker  . Heart disease Father        No details.    . Hyperlipidemia Sister   . Heart disease Brother        "Heart Attacks"  Died age 43  . Cancer Daughter        Oral cancer and "knot" on right cheek that was cancer    SH Social History   Tobacco Use  . Smoking status: Former Smoker    Packs/day: 0.10    Years: 44.00    Pack years: 4.40    Types: Cigarettes  . Smokeless tobacco: Never Used  Substance Use Topics  . Alcohol use: Yes    Alcohol/week: 3.0 oz    Types: 5 Glasses of wine per week  . Drug use: No    Review of Systems Review of systems negative except for pertinent positives and negatives in history of present illness above.     Objective:     Vitals:   06/12/17 1038  BP: 110/68  Pulse: 76  Temp: 97.9 F (36.6 C)  TempSrc: Oral  SpO2: 98%  Weight: 144 lb 3.2 oz (65.4 kg)  Height: 5\' 5"  (1.651 m)   Body mass index is 24 kg/m.  Physical Exam GEN: appears well, no ditress,  EYES: PERRL, EOMI THROAT: MMM RESP:   [place] in place, no IWOB, coarse crackles throughout, no wheezing CVS:  RRR, normal S1&S2, no murmurs GI: soft, NT with active BS MSK: No edema bilaterally NEURO: Grossly intact PSYCH: normal affect     Assessment and Plan:  1. COPD exacerbation ([place]): Stable on home 3 L by nasal cannula.  She denies dyspnea, chest pain or cough.  Reports good compliance with Advair.   2. Chronic combined systolic and diastolic congestive heart failure ([place]): Echo about 4  months ago with EF of 25% to 30% and diffuse hypokinesis. She appears to be at her dry weight.  She denies respiratory symptoms.  She is on home 3 L by nasal cannula.  Lung exam with coarse crackles likely from underlying chronic interstitial fibrosis versus CHF or pneumonia. -Recommended continuing Lasix 20 mg every other day -Recommended weighing herself daily -Suggested taking 40 mg of Lasix if her weight is 2-3 pounds over baseline weight which appears to be 144 pounds -She is not on GDMT due to hypotension and fall in the past.  I defer this to her cardiologist  3. Need for immunization against influenza - Flu Vaccine QUAD 36+ mos IM  Return in about 2 weeks (around 06/26/2017) for Annual wellness visit.  Mercy Riding, MD 06/12/17 Pager: [phone]

## 2017-06-12 NOTE — Patient Instructions (Addendum)
It was great seeing you today! We have addressed the following issues today  COPD: Continue using your breathing treatments.  Oxygen as needed to keep your oxygen level greater than 92%.   Heart failure: Continue taking your fluid pill every other day.  I recommend weighing yourself daily.  If you weight is 2-3 pounds over 144 pounds, you may take 2 of your fluid pill that morning.   Please schedule a follow-up visit in 2 weeks for your annual wellness visit.    If we did any lab work today, and the results require attention, either me or my nurse will get in touch with you. If everything is normal, you will get a letter in mail and a message via . If you don't hear from [place] in two weeks, please give [place] a call. Otherwise, we look forward to seeing you again at your next visit. If you have any questions or concerns before then, please call the clinic at [phone].  Please bring all your medications to every doctors visit  Sign up for My Chart to have easy access to your labs results, and communication with your Primary care physician.    Please check-out at the front desk before leaving the clinic.    Take Care,   Dr. Cyndia Skeeters

## 2017-06-17 ENCOUNTER — Other Ambulatory Visit: Payer: Self-pay | Admitting: Family Medicine

## 2017-06-22 ENCOUNTER — Other Ambulatory Visit: Payer: Self-pay | Admitting: Family Medicine

## 2017-07-10 ENCOUNTER — Ambulatory Visit: Payer: Medicare Other | Admitting: Cardiovascular Disease

## 2017-07-19 ENCOUNTER — Ambulatory Visit (INDEPENDENT_AMBULATORY_CARE_PROVIDER_SITE_OTHER): Payer: Medicare Other | Admitting: *Deleted

## 2017-07-19 DIAGNOSIS — I428 Other cardiomyopathies: Secondary | ICD-10-CM | POA: Diagnosis not present

## 2017-07-19 NOTE — Progress Notes (Signed)
Remote ICD transmission.   

## 2017-07-20 ENCOUNTER — Encounter: Payer: Self-pay | Admitting: Cardiology

## 2017-07-20 LAB — CUP PACEART REMOTE DEVICE CHECK
Battery Voltage: 2.68 V
Brady Statistic RV Percent Paced: 0.02 %
HIGH POWER IMPEDANCE MEASURED VALUE: 304 Ohm
HIGH POWER IMPEDANCE MEASURED VALUE: 73 Ohm
Lead Channel Sensing Intrinsic Amplitude: 10.25 mV
Lead Channel Setting Pacing Amplitude: 2 V
Lead Channel Setting Pacing Pulse Width: 0.4 ms
MDC IDC LEAD IMPLANT DT: 20111125
MDC IDC LEAD LOCATION: 753860
MDC IDC MSMT LEADCHNL RV IMPEDANCE VALUE: 361 Ohm
MDC IDC MSMT LEADCHNL RV SENSING INTR AMPL: 10.25 mV
MDC IDC PG IMPLANT DT: 20111125
MDC IDC SESS DTM: 20181227092310
MDC IDC SET LEADCHNL RV SENSING SENSITIVITY: 0.45 mV

## 2017-07-27 ENCOUNTER — Other Ambulatory Visit: Payer: Self-pay | Admitting: Family Medicine

## 2017-08-24 ENCOUNTER — Other Ambulatory Visit: Payer: Self-pay | Admitting: Family Medicine

## 2017-09-15 ENCOUNTER — Other Ambulatory Visit: Payer: Self-pay | Admitting: Family Medicine

## 2017-09-28 ENCOUNTER — Other Ambulatory Visit (HOSPITAL_COMMUNITY): Payer: Medicare Other

## 2017-09-28 ENCOUNTER — Emergency Department (HOSPITAL_COMMUNITY): Payer: Medicare Other

## 2017-09-28 ENCOUNTER — Inpatient Hospital Stay (HOSPITAL_COMMUNITY)
Admission: EM | Admit: 2017-09-28 | Discharge: 2017-10-22 | DRG: 191 | Disposition: E | Payer: Medicare Other | Attending: Family Medicine | Admitting: Family Medicine

## 2017-09-28 ENCOUNTER — Inpatient Hospital Stay (HOSPITAL_COMMUNITY): Payer: Medicare Other

## 2017-09-28 DIAGNOSIS — I110 Hypertensive heart disease with heart failure: Secondary | ICD-10-CM | POA: Diagnosis present

## 2017-09-28 DIAGNOSIS — J9611 Chronic respiratory failure with hypoxia: Secondary | ICD-10-CM | POA: Diagnosis present

## 2017-09-28 DIAGNOSIS — Z961 Presence of intraocular lens: Secondary | ICD-10-CM | POA: Diagnosis present

## 2017-09-28 DIAGNOSIS — J8410 Pulmonary fibrosis, unspecified: Secondary | ICD-10-CM | POA: Diagnosis present

## 2017-09-28 DIAGNOSIS — Z888 Allergy status to other drugs, medicaments and biological substances status: Secondary | ICD-10-CM

## 2017-09-28 DIAGNOSIS — I959 Hypotension, unspecified: Secondary | ICD-10-CM | POA: Diagnosis not present

## 2017-09-28 DIAGNOSIS — R945 Abnormal results of liver function studies: Secondary | ICD-10-CM | POA: Diagnosis not present

## 2017-09-28 DIAGNOSIS — E860 Dehydration: Secondary | ICD-10-CM | POA: Diagnosis present

## 2017-09-28 DIAGNOSIS — R740 Nonspecific elevation of levels of transaminase and lactic acid dehydrogenase [LDH]: Secondary | ICD-10-CM | POA: Diagnosis present

## 2017-09-28 DIAGNOSIS — E785 Hyperlipidemia, unspecified: Secondary | ICD-10-CM | POA: Diagnosis present

## 2017-09-28 DIAGNOSIS — E875 Hyperkalemia: Secondary | ICD-10-CM | POA: Diagnosis not present

## 2017-09-28 DIAGNOSIS — M810 Age-related osteoporosis without current pathological fracture: Secondary | ICD-10-CM | POA: Diagnosis present

## 2017-09-28 DIAGNOSIS — R7989 Other specified abnormal findings of blood chemistry: Secondary | ICD-10-CM

## 2017-09-28 DIAGNOSIS — T451X5A Adverse effect of antineoplastic and immunosuppressive drugs, initial encounter: Secondary | ICD-10-CM | POA: Diagnosis present

## 2017-09-28 DIAGNOSIS — Z9181 History of falling: Secondary | ICD-10-CM

## 2017-09-28 DIAGNOSIS — I447 Left bundle-branch block, unspecified: Secondary | ICD-10-CM | POA: Diagnosis present

## 2017-09-28 DIAGNOSIS — J441 Chronic obstructive pulmonary disease with (acute) exacerbation: Principal | ICD-10-CM | POA: Diagnosis present

## 2017-09-28 DIAGNOSIS — Z853 Personal history of malignant neoplasm of breast: Secondary | ICD-10-CM

## 2017-09-28 DIAGNOSIS — Z9012 Acquired absence of left breast and nipple: Secondary | ICD-10-CM

## 2017-09-28 DIAGNOSIS — R0602 Shortness of breath: Secondary | ICD-10-CM

## 2017-09-28 DIAGNOSIS — I4891 Unspecified atrial fibrillation: Secondary | ICD-10-CM

## 2017-09-28 DIAGNOSIS — Z8249 Family history of ischemic heart disease and other diseases of the circulatory system: Secondary | ICD-10-CM

## 2017-09-28 DIAGNOSIS — E7800 Pure hypercholesterolemia, unspecified: Secondary | ICD-10-CM | POA: Diagnosis present

## 2017-09-28 DIAGNOSIS — I480 Paroxysmal atrial fibrillation: Secondary | ICD-10-CM | POA: Diagnosis present

## 2017-09-28 DIAGNOSIS — Z7982 Long term (current) use of aspirin: Secondary | ICD-10-CM

## 2017-09-28 DIAGNOSIS — E872 Acidosis: Secondary | ICD-10-CM | POA: Diagnosis present

## 2017-09-28 DIAGNOSIS — N179 Acute kidney failure, unspecified: Secondary | ICD-10-CM | POA: Diagnosis present

## 2017-09-28 DIAGNOSIS — I5042 Chronic combined systolic (congestive) and diastolic (congestive) heart failure: Secondary | ICD-10-CM | POA: Diagnosis present

## 2017-09-28 DIAGNOSIS — Z8349 Family history of other endocrine, nutritional and metabolic diseases: Secondary | ICD-10-CM

## 2017-09-28 DIAGNOSIS — Z95810 Presence of automatic (implantable) cardiac defibrillator: Secondary | ICD-10-CM

## 2017-09-28 DIAGNOSIS — Z79899 Other long term (current) drug therapy: Secondary | ICD-10-CM

## 2017-09-28 DIAGNOSIS — I429 Cardiomyopathy, unspecified: Secondary | ICD-10-CM | POA: Diagnosis present

## 2017-09-28 DIAGNOSIS — F329 Major depressive disorder, single episode, unspecified: Secondary | ICD-10-CM | POA: Diagnosis present

## 2017-09-28 DIAGNOSIS — R001 Bradycardia, unspecified: Secondary | ICD-10-CM | POA: Diagnosis not present

## 2017-09-28 DIAGNOSIS — F419 Anxiety disorder, unspecified: Secondary | ICD-10-CM | POA: Diagnosis present

## 2017-09-28 DIAGNOSIS — R0603 Acute respiratory distress: Secondary | ICD-10-CM | POA: Diagnosis present

## 2017-09-28 DIAGNOSIS — R531 Weakness: Secondary | ICD-10-CM | POA: Diagnosis not present

## 2017-09-28 DIAGNOSIS — R000 Tachycardia, unspecified: Secondary | ICD-10-CM | POA: Diagnosis not present

## 2017-09-28 DIAGNOSIS — G4700 Insomnia, unspecified: Secondary | ICD-10-CM | POA: Diagnosis present

## 2017-09-28 DIAGNOSIS — Z66 Do not resuscitate: Secondary | ICD-10-CM | POA: Diagnosis present

## 2017-09-28 DIAGNOSIS — R1011 Right upper quadrant pain: Secondary | ICD-10-CM | POA: Diagnosis not present

## 2017-09-28 DIAGNOSIS — Z87891 Personal history of nicotine dependence: Secondary | ICD-10-CM

## 2017-09-28 DIAGNOSIS — Z9981 Dependence on supplemental oxygen: Secondary | ICD-10-CM

## 2017-09-28 DIAGNOSIS — Z881 Allergy status to other antibiotic agents status: Secondary | ICD-10-CM

## 2017-09-28 DIAGNOSIS — R404 Transient alteration of awareness: Secondary | ICD-10-CM | POA: Diagnosis not present

## 2017-09-28 DIAGNOSIS — R1084 Generalized abdominal pain: Secondary | ICD-10-CM | POA: Diagnosis not present

## 2017-09-28 LAB — CBC
HCT: 39.2 % (ref 36.0–46.0)
Hemoglobin: 12.3 g/dL (ref 12.0–15.0)
MCH: 29.9 pg (ref 26.0–34.0)
MCHC: 31.4 g/dL (ref 30.0–36.0)
MCV: 95.1 fL (ref 78.0–100.0)
Platelets: 205 K/uL (ref 150–400)
RBC: 4.12 MIL/uL (ref 3.87–5.11)
RDW: 15.9 % — ABNORMAL HIGH (ref 11.5–15.5)
WBC: 10.6 K/uL — ABNORMAL HIGH (ref 4.0–10.5)

## 2017-09-28 LAB — BASIC METABOLIC PANEL
ANION GAP: 19 — AB (ref 5–15)
BUN: 46 mg/dL — AB (ref 6–20)
CALCIUM: 9.5 mg/dL (ref 8.9–10.3)
CO2: 18 mmol/L — ABNORMAL LOW (ref 22–32)
Chloride: 103 mmol/L (ref 101–111)
Creatinine, Ser: 2.49 mg/dL — ABNORMAL HIGH (ref 0.44–1.00)
GFR calc Af Amer: 19 mL/min — ABNORMAL LOW (ref >60)
GFR calc non Af Amer: 17 mL/min — ABNORMAL LOW (ref >60)
GLUCOSE: 135 mg/dL — AB (ref 65–99)
POTASSIUM: 5.3 mmol/L — AB (ref 3.5–5.1)
SODIUM: 140 mmol/L (ref 135–145)

## 2017-09-28 LAB — BRAIN NATRIURETIC PEPTIDE: B Natriuretic Peptide: 3354.2 pg/mL — ABNORMAL HIGH (ref 0.0–100.0)

## 2017-09-28 LAB — COMPREHENSIVE METABOLIC PANEL
ALBUMIN: 3.8 g/dL (ref 3.5–5.0)
ALT: 135 U/L — ABNORMAL HIGH (ref 14–54)
AST: 198 U/L — AB (ref 15–41)
Alkaline Phosphatase: 80 U/L (ref 38–126)
Anion gap: 16 — ABNORMAL HIGH (ref 5–15)
BILIRUBIN TOTAL: 1.3 mg/dL — AB (ref 0.3–1.2)
BUN: 47 mg/dL — AB (ref 6–20)
CHLORIDE: 103 mmol/L (ref 101–111)
CO2: 20 mmol/L — ABNORMAL LOW (ref 22–32)
Calcium: 9.1 mg/dL (ref 8.9–10.3)
Creatinine, Ser: 2.43 mg/dL — ABNORMAL HIGH (ref 0.44–1.00)
GFR calc Af Amer: 20 mL/min — ABNORMAL LOW (ref >60)
GFR calc non Af Amer: 17 mL/min — ABNORMAL LOW (ref >60)
GLUCOSE: 131 mg/dL — AB (ref 65–99)
POTASSIUM: 6.1 mmol/L — AB (ref 3.5–5.1)
Sodium: 139 mmol/L (ref 135–145)
TOTAL PROTEIN: 7.7 g/dL (ref 6.5–8.1)

## 2017-09-28 LAB — I-STAT TROPONIN, ED: Troponin i, poc: 0.04 ng/mL (ref 0.00–0.08)

## 2017-09-28 LAB — LIPASE, BLOOD: Lipase: 41 U/L (ref 11–51)

## 2017-09-28 LAB — LACTIC ACID, PLASMA
Lactic Acid, Venous: 6.3 mmol/L (ref 0.5–1.9)
Lactic Acid, Venous: 6.6 mmol/L (ref 0.5–1.9)

## 2017-09-28 MED ORDER — SODIUM CHLORIDE 0.9% FLUSH
3.0000 mL | Freq: Two times a day (BID) | INTRAVENOUS | Status: DC
  Administered 2017-09-28: 3 mL via INTRAVENOUS

## 2017-09-28 MED ORDER — POLYETHYLENE GLYCOL 3350 17 G PO PACK: 17.0000 g | PACK | Freq: Every day | ORAL | Status: DC | PRN

## 2017-09-28 MED ORDER — LEVOFLOXACIN IN D5W 250 MG/50ML IV SOLN: 250.0000 mg | INTRAVENOUS | Status: DC

## 2017-09-28 MED ORDER — SODIUM CHLORIDE 0.9 % IV SOLN
INTRAVENOUS | Status: DC
  Administered 2017-09-28: 14:00:00 via INTRAVENOUS

## 2017-09-28 MED ORDER — ALBUTEROL SULFATE (2.5 MG/3ML) 0.083% IN NEBU: 2.5000 mg | INHALATION_SOLUTION | RESPIRATORY_TRACT | Status: DC | PRN

## 2017-09-28 MED ORDER — ENSURE ENLIVE PO LIQD: 237.0000 mL | Freq: Two times a day (BID) | ORAL | Status: DC

## 2017-09-28 MED ORDER — CALCIUM GLUCONATE 10 % IV SOLN
1.0000 g | Freq: Once | INTRAVENOUS | Status: DC
  Filled 2017-09-28: qty 10

## 2017-09-28 MED ORDER — LEVOFLOXACIN IN D5W 500 MG/100ML IV SOLN
500.0000 mg | Freq: Once | INTRAVENOUS | Status: AC
  Administered 2017-09-28: 500 mg via INTRAVENOUS
  Filled 2017-09-28: qty 100

## 2017-09-28 MED ORDER — ALBUTEROL (5 MG/ML) CONTINUOUS INHALATION SOLN
10.0000 mg/h | INHALATION_SOLUTION | RESPIRATORY_TRACT | Status: DC
  Administered 2017-09-28: 10 mg/h via RESPIRATORY_TRACT
  Filled 2017-09-28: qty 20

## 2017-09-28 MED ORDER — FUROSEMIDE 10 MG/ML IJ SOLN
40.0000 mg | Freq: Once | INTRAMUSCULAR | Status: AC
  Administered 2017-09-28: 40 mg via INTRAVENOUS
  Filled 2017-09-28: qty 4

## 2017-09-28 MED ORDER — SODIUM CHLORIDE 0.9 % IV BOLUS (SEPSIS)
500.0000 mL | Freq: Once | INTRAVENOUS | Status: AC
  Administered 2017-09-28: 500 mL via INTRAVENOUS

## 2017-09-28 MED ORDER — FLUOXETINE HCL 10 MG PO CAPS
10.0000 mg | ORAL_CAPSULE | Freq: Every day | ORAL | Status: DC
  Administered 2017-09-28 – 2017-09-29 (×2): 10 mg via ORAL
  Filled 2017-09-28 (×3): qty 1

## 2017-09-28 MED ORDER — PREDNISONE 20 MG PO TABS
40.0000 mg | ORAL_TABLET | Freq: Every day | ORAL | Status: DC
  Administered 2017-09-29: 40 mg via ORAL
  Filled 2017-09-28: qty 2

## 2017-09-28 MED ORDER — IPRATROPIUM-ALBUTEROL 0.5-2.5 (3) MG/3ML IN SOLN
3.0000 mL | Freq: Four times a day (QID) | RESPIRATORY_TRACT | Status: DC
  Administered 2017-09-29 – 2017-09-30 (×4): 3 mL via RESPIRATORY_TRACT
  Filled 2017-09-28 (×7): qty 3

## 2017-09-28 MED ORDER — ENOXAPARIN SODIUM 30 MG/0.3ML ~~LOC~~ SOLN
30.0000 mg | SUBCUTANEOUS | Status: DC
  Administered 2017-09-28 – 2017-09-29 (×2): 30 mg via SUBCUTANEOUS
  Filled 2017-09-28 (×3): qty 0.3

## 2017-09-28 MED ORDER — METOPROLOL TARTRATE 12.5 MG HALF TABLET
12.5000 mg | ORAL_TABLET | Freq: Two times a day (BID) | ORAL | Status: DC
  Administered 2017-09-28 – 2017-09-29 (×2): 12.5 mg via ORAL
  Filled 2017-09-28 (×3): qty 1

## 2017-09-28 MED ORDER — SODIUM CHLORIDE 0.9 % IV SOLN
1.0000 g | Freq: Once | INTRAVENOUS | Status: AC
  Administered 2017-09-28: 1 g via INTRAVENOUS
  Filled 2017-09-28: qty 10

## 2017-09-28 MED ORDER — ASPIRIN EC 81 MG PO TBEC
81.0000 mg | DELAYED_RELEASE_TABLET | Freq: Every day | ORAL | Status: DC
  Administered 2017-09-28: 81 mg via ORAL
  Filled 2017-09-28 (×2): qty 1

## 2017-09-28 MED ORDER — IPRATROPIUM BROMIDE 0.02 % IN SOLN
0.5000 mg | Freq: Once | RESPIRATORY_TRACT | Status: AC
  Administered 2017-09-28: 0.5 mg via RESPIRATORY_TRACT
  Filled 2017-09-28: qty 2.5

## 2017-09-28 MED ORDER — METHYLPREDNISOLONE SODIUM SUCC 125 MG IJ SOLR
125.0000 mg | Freq: Once | INTRAMUSCULAR | Status: AC
  Administered 2017-09-28: 125 mg via INTRAVENOUS
  Filled 2017-09-28: qty 2

## 2017-09-28 NOTE — H&P (Addendum)
[facility] Admission History and Physical Service Pager: [phone]  Patient name: Brianna [name] Medical record number: [id number] Date of birth: [birth year] Age: 84 y.o. Gender: female  Primary Care Provider: Donnamae Jude, MD Consultants: n/a Code Status: full  Chief Complaint: SOB  Assessment and Plan: Brianna [name] is a 84 y.o. female presenting with SOB x 2days and AKI . PMH is significant for HFrEF 25-30%, COPD, Paroxysmal Afib, hx of breast cancer, auto defibriliator, asthma, major depression, LBBB  SOB: With 2L O2 requirement and on CAT in ED. Wears 2L O2 at home. SOB for 2 days with no fever, she does have dry cough.  No recent sick symptoms or contacts.  Concerning for COPD vs CHF exacerbation.   Lung sounds with both wheezing and crackles.  Patient does not have noteable edema on LE but with significantly reduced EF might have narrow threshold for fluid balance.  Weights have been consistent lately per caregiver. Dry weight is 140.  Caregiver administers meds at home, reports compliance with QOD lasix 20mg  PO and advair inhaler daily. Patient taking Aleeve 2 days prior to admission, question whether this began fluid overload in tenuous fluid status given poor EF.  -Admit to telemetry, Dr. Nori Riis -CAT, will attempt transition soon to Q6 duoneb and Q2PRN albuterol -solumedrol in ED, will transition to prednisone 40 tommorow -metoprolol 12.5 BID -Continuous pulseox -levaquin for COPD exacerbation -lasix 40mg  IV once -vitals per floor routine  AKI: Creatinine 2.43 with BL >0.90>1.0. AKI likely 2/2 fluid overload in the setting of CHF compounded by NSAIDs at home. -daily BMP -will consider further diuresis -avoid nephrotoxic meds -urine culture  Transaminitis: patient with elevated transaminases, suspect 2/2 fluid overload and congestive hepatopathy. If not improving with diuresis, consider further workup.  -diuresis as above  RUQ pain: ~2 days  as well.  No gastric disturbances.  No masses, tender to palpation RUQ, neg murphy.   Potentially gallbladder vs hepatopathy from CHF backup given increased LFTs. -RUQ [place] -daily CMP -daily exams  Hyperkalemia: 6.1 in ED.  Peaked [place] on ecg.   Calcium gluconate given. Expect improvement with albuterol nebs.  -will recheck BMP -lasix to encourage diuresis  Major Depression: no medication in chart currently -will discuss with patient  Paroxysmal Afib/autodefibrilator/LBBB-not on oral anticoagulant due to fall risk at home -aspirin 81 -will discuss anticoagulation plan with patient/family  FEN/GI: regular Prophylaxis: reduced renal clearance lovenox  Disposition: to home when cleared  History of Present Illness:  Brianna [name] is a 84 y.o. female presenting with SOB x 2days.  She denies fevers/sick contacts/ productive cough/n/v.   She has been compliant with meds via a caregiver who also states her weight has been consistent and she has not been gaining.  She has had a dry cough, no sore throat, no ear pain.   She has not had any increase in orthopnea and no edema in her feet.  She has also had RUQ pain for 2 days.   This has not come with any diarrhea/consitpation.   She has noticed the pain mainly if something pushes against her stomach but she has no palpable masses or skin changes. She has not sought treatment for this elsewhere.  The pain is not timed with eating and she has had no nausea.  We did discuss DNR status with patient and her daughter present, they confirmed patient wants to be DNR and say there is a MOST form to that effect although I have been  unable to locate that form.  Review Of Systems: Per HPI with the following additions:   Review of Systems  Constitutional: Positive for malaise/fatigue. Negative for weight loss.  HENT: Negative for hearing loss, sinus pain and sore throat.   Eyes: Negative for blurred vision, pain and redness.  Cardiovascular: Positive for  orthopnea. Negative for chest pain and leg swelling.  Gastrointestinal: Negative for blood in stool, constipation, diarrhea, melena, nausea and vomiting.  Genitourinary: Negative for dysuria.  Musculoskeletal: Negative for falls.  Skin: Negative for itching and rash.  Psychiatric/Behavioral: Negative for substance abuse.    Patient Active Problem List   Diagnosis Date Noted  . AKI (acute kidney injury) ([place]) 10/08/2017  . Acute systolic CHF (congestive heart failure) ([place]) 05/11/2017  . Syncope and collapse 02/08/2017  . Right hip pain 02/08/2017  . Chronic systolic CHF (congestive heart failure) ([place]) 02/08/2017  . Chronic combined systolic and diastolic CHF (congestive heart failure) ([place]) 02/08/2017  . Hypokalemia 12/27/2016  . LBBB (left bundle branch block) 12/23/2016  . Atrial fibrillation, new onset ([place]) 12/23/2016  . COPD exacerbation ([place])   . Acute on chronic systolic congestive heart failure ([place])   . Hypoxia 12/20/2016  . Fracture of right humerus 12/29/2015  . Syncope 12/28/2015  . COPD (chronic obstructive pulmonary disease) ([place]) 06/10/2014  . Asthma, chronic 05/19/2014  . Constipation 05/19/2014  . Major depression, chronic 05/19/2014  . Protein-calorie malnutrition, severe ([place]) 05/12/2014  . Hip fracture, intertrochanteric ([place]) 05/10/2014  . NICM (nonischemic cardiomyopathy) ([place]) 03/24/2014  . Dyspnea 08/03/2013  . Automatic implantable cardioverter-defibrillator in situ 04/16/2013  . CYSTOCELE WITHOUT MENTION UTERINE PROLAPSE LAT 10/12/2008  . History of breast cancer 11/05/2007  . INSOMNIA-SLEEP DISORDER-UNSPEC 07/10/2007  . HYPERCHOLESTEROLEMIA 09/20/2006  . Essential hypertension 09/20/2006  . VENTRICULAR TACHYCARDIA 09/20/2006  . Chronic diastolic heart failure ([place]) 09/20/2006  . PSORIASIS 09/20/2006  . Osteoporosis 09/20/2006    Past Medical History: Past Medical History:  Diagnosis Date  . AICD (automatic cardioverter/defibrillator) present     a. 05/2010 MDT D33VRG Protecta VR single lead AICD (ser # [id number]).  Marland Kitchen Anxiety   . Arthritis    "lower back; knees" (12/20/2016)  . Cancer of left breast ([place])   . CHF (congestive heart failure) ([place])   . Depression   . Emphysema of lung ([place])   . Falls frequently    12/20/2016 "couple falls since 07/2016"  . Heart murmur   . HFrEF (heart failure with reduced ejection fraction) ([place])    a. 12/2015 Echo: EF 40-45%;  b. 12/2016 Echo: EF 25-30%, diff HK.  Marland Kitchen Hyperlipidemia   . Hypertension   . Nonischemic cardiomyopathy ([place])    a. Felt to be 2/2 ChemoRx for Breast CA;  b. 2006 s/p AICD;  c. 05/2010 ICD upgrade: MDT D33VRG Protecta VR single lead AICD (ser # [id number]);  d. 12/2015 Echo: EF 40-45%;  e. 12/2016 Echo: EF 25-30%, diff HK, mild MR, PASP 30mmHg.  . Osteopenia   . Paroxysmal atrial fibrillation ([place])    a. Previously noted on device interrogation for brief periods. No OAC 2/2 falls/frailty.  . Pneumonia ~ 0867-6195 X 3    Past Surgical History: Past Surgical History:  Procedure Laterality Date  . APPENDECTOMY  1943  . BREAST BIOPSY Left  [id number]  . CARDIAC CATHETERIZATION  12/18/2002   Normal coronary arteries and normal LV function  . CARDIAC DEFIBRILLATOR PLACEMENT  10/13/2004   Medtronic Maximo VR, model [id number], serial [id number]  . CATARACT EXTRACTION W/ INTRAOCULAR  LENS  IMPLANT, BILATERAL    . FEMUR IM NAIL Right 05/10/2014   Procedure: INTRAMEDULLARY (IM) NAIL FEMORAL;  Surgeon: Marianna Payment, MD;  Location: [facility];  Service: Orthopedics;  Laterality: Right;  . FRACTURE SURGERY    . MASTECTOMY Left  [id number]  . PACEMAKER INSERTION  06/17/2010   Medtronic Protecta VR, model #[id number], serial [id number]  . TONSILLECTOMY  1940  . TUBAL LIGATION      Social History: Social History   Tobacco Use  . Smoking status: Former Smoker    Packs/day: 0.10    Years: 44.00    Pack years: 4.40    Types: Cigarettes  . Smokeless tobacco: Never Used  Substance Use  Topics  . Alcohol use: Yes    Alcohol/week: 3.0 oz    Types: 5 Glasses of wine per week  . Drug use: No   Additional social history:   Please also refer to relevant sections of EMR.  Family History: Family History  Problem Relation Age of Onset  . Heart disease Mother        Pacemaker  . Heart disease Father        No details.    . Hyperlipidemia Sister   . Heart disease Brother        "Heart Attacks"  Died age 26  . Cancer Daughter        Oral cancer and "knot" on right cheek that was cancer   (If not completed, MUST add something in)  Allergies and Medications: Allergies  Allergen Reactions  . Atorvastatin Other (See Comments)    Reaction:  Fatigue   . Claritin [Loratadine] Other (See Comments)    Reaction:  Fatigue   . Crestor [Rosuvastatin] Other (See Comments)    Reaction:  Muscle pain    No current facility-administered medications on file prior to encounter.    Current Outpatient Medications on File Prior to Encounter  Medication Sig Dispense Refill  . ADVAIR DISKUS 100-50 MCG/DOSE AEPB INHALE 1 PUFF INTO THE LUNGS 2 (TWO) TIMES DAILY. 60 each 1  . albuterol (PROAIR HFA) 108 (90 Base) MCG/ACT inhaler Inhale 2 puffs into the lungs every 6 (six) hours as needed for wheezing or shortness of breath. 8 g 2  . aspirin 81 MG chewable tablet Chew 81 mg by mouth daily.    . feeding supplement, ENSURE ENLIVE, (ENSURE ENLIVE) LIQD Take 237 mLs by mouth 2 (two) times daily between meals. 237 mL 12  . FLUoxetine (PROZAC) 10 MG capsule Take 10 mg by mouth daily.  2  . furosemide (LASIX) 20 MG tablet Take 1 tablet (20 mg total) by mouth every other day AS DIRECTED. (Patient taking differently: Take 20 mg every other day by mouth. ) 45 tablet 3  . ipratropium-albuterol (DUONEB) 0.5-2.5 (3) MG/3ML SOLN Take 3 mLs by nebulization every 6 (six) hours as needed (wheezing, shortness of breath). 360 mL 0  . KLOR-CON M20 20 MEQ tablet TAKE 1 TABLET BY MOUTH EVERY DAY 30 tablet 0  .  metoprolol tartrate (LOPRESSOR) 25 MG tablet TAKE 0.5 TABLETS (12.5 MG TOTAL) BY MOUTH 2 (TWO) TIMES DAILY. 60 tablet 0  . Multiple Vitamin (MULTIVITAMIN WITH MINERALS) TABS tablet Take 1 tablet by mouth daily.    . polyethylene glycol (MIRALAX / GLYCOLAX) packet Take 17 g by mouth daily as needed for mild constipation. 14 each 0    Objective: BP (!) 110/94   Pulse (!) 125   Temp (!) 96.4 F (35.8 C) (Rectal)  Resp 17   SpO2 93%  Exam: General: frail, pleasant and oriented but in mild distress Eyes: no injection/drainage, EOMI ENTM: no ear pain, nose with minor redness from CAT mask but no epistaxis, MMM Neck: no lesions, ROM restrictions noted Cardiovascular: tachy, potential murmur but difficult to hear with CAT and IWB, autodefibrilator in place Respiratory: IWB, expiratory wheezes, lower lung fields with crackles Gastrointestinal: RUQ pain to palpation, neg murphy, no pain elsewhere, no masses to palpation, no skin changes noted MSK: Frail but with no edema in LE bilaterally, gross motor control intact Derm: no significant lesions/rashes Neuro: grossly intact with no complaints noted Psych: pleasant and oriented, appropriate thought process.  Labs and Imaging: CBC BMET  Recent Labs  Lab 10/01/2017 1258  WBC 10.6*  HGB 12.3  HCT 39.2  PLT 205   Recent Labs  Lab 10/05/2017 1258  NA 139  K 6.1*  CL 103  CO2 20*  BUN 47*  CREATININE 2.43*  GLUCOSE 131*  CALCIUM 9.1     Dg Chest Port 1 View  Result Date: 10/16/2017 CLINICAL DATA:  Shortness of breath, weakness since yesterday, hypertension, former smoker. EXAM: PORTABLE CHEST 1 VIEW COMPARISON:  Chest x-ray dated 05/30/2017 FINDINGS: Cardiomegaly appears grossly stable. Diffusely prominent interstitial markings are not significantly changed. No new confluent opacity to suggest a developing pneumonia. No pleural effusion or pneumothorax appreciated. Right chest wall pacemaker leads appear stable in position. Chronic  deformity of the right humeral head/neck. No acute or suspicious osseous finding. IMPRESSION: 1. No acute findings.  No evidence of pneumonia or pulmonary edema. 2. Cardiomegaly. 3. Coarse lung markings bilaterally, stable, most likely chronic interstitial lung disease. Electronically Signed   By: Franki Cabot M.D.   On: 10/18/2017 13:07    Sherene Sires, DO 09/22/2017, 3:49 PM PGY-1, [place] Intern pager: [phone], text pages welcome  FPTS Upper-Level Resident Addendum  I have independently interviewed and examined the patient. I have discussed the above with the original author and agree with their documentation. My edits for correction/addition/clarification are in blue. Please see also any attending notes.   Ralene Ok, MD PGY-2, [place] Service pager: [phone] (text pages welcome through [facility])

## 2017-09-28 NOTE — ED Triage Notes (Signed)
Pt here from home with c/o weakness and sob , pt is on O2 otc , cbg 186

## 2017-09-28 NOTE — Progress Notes (Signed)
Family practice MD s at bedside. Made aware of elevated HR and no urine output since beginning of shift.

## 2017-09-28 NOTE — Plan of Care (Signed)
  Health Behavior/Discharge Planning: Ability to manage health-related needs will improve 09/27/2017 2230 - Progressing by Isador Castille A, RN   Clinical Measurements: Ability to maintain clinical measurements within normal limits will improve 10/05/2017 2230 - Progressing by Shawnise Peterkin A, RN   Activity: Risk for activity intolerance will decrease 10/20/2017 2230 - Progressing by Rease Wence, Roma Kayser, RN

## 2017-09-28 NOTE — ED Provider Notes (Signed)
[facility] EMERGENCY DEPARTMENT Provider Note   CSN: [id number] Arrival date & time: 10/18/2017  1235     History   Chief Complaint Chief Complaint  Patient presents with  . Shortness of Breath    HPI PAGE Brianna [name] is a 84 y.o. female.  Patient with hx copd, presents with generalized weakness and increased sob/wheezing in the past 1-2 days. Symptoms mod-severe, persistent, worse today. Pt very limited historian - level 5 caveat. No fevers. No chest pain.    The history is provided by the patient and the EMS personnel. The history is limited by the condition of the patient.  Shortness of Breath  Associated symptoms include wheezing. Pertinent negatives include no fever.    Past Medical History:  Diagnosis Date  . AICD (automatic cardioverter/defibrillator) present    a. 05/2010 MDT D33VRG Protecta VR single lead AICD (ser # [id number]).  Marland Kitchen Anxiety   . Arthritis    "lower back; knees" (12/20/2016)  . Cancer of left breast ([place])   . CHF (congestive heart failure) ([place])   . Depression   . Emphysema of lung ([place])   . Falls frequently    12/20/2016 "couple falls since 07/2016"  . Heart murmur   . HFrEF (heart failure with reduced ejection fraction) ([place])    a. 12/2015 Echo: EF 40-45%;  b. 12/2016 Echo: EF 25-30%, diff HK.  Marland Kitchen Hyperlipidemia   . Hypertension   . Nonischemic cardiomyopathy ([place])    a. Felt to be 2/2 ChemoRx for Breast CA;  b. 2006 s/p AICD;  c. 05/2010 ICD upgrade: MDT D33VRG Protecta VR single lead AICD (ser # [id number]);  d. 12/2015 Echo: EF 40-45%;  e. 12/2016 Echo: EF 25-30%, diff HK, mild MR, PASP 38mmHg.  . Osteopenia   . Paroxysmal atrial fibrillation ([place])    a. Previously noted on device interrogation for brief periods. No OAC 2/2 falls/frailty.  . Pneumonia ~ 2876-8115 X 3    Patient Active Problem List   Diagnosis Date Noted  . Acute systolic CHF (congestive heart failure) ([place]) 05/11/2017  . Syncope and collapse 02/08/2017  .  Right hip pain 02/08/2017  . Chronic systolic CHF (congestive heart failure) ([place]) 02/08/2017  . Chronic combined systolic and diastolic CHF (congestive heart failure) ([place]) 02/08/2017  . Hypokalemia 12/27/2016  . LBBB (left bundle branch block) 12/23/2016  . Atrial fibrillation, new onset ([place]) 12/23/2016  . COPD exacerbation ([place])   . Acute on chronic systolic congestive heart failure ([place])   . Hypoxia 12/20/2016  . Fracture of right humerus 12/29/2015  . Syncope 12/28/2015  . COPD (chronic obstructive pulmonary disease) ([place]) 06/10/2014  . Asthma, chronic 05/19/2014  . Constipation 05/19/2014  . Major depression, chronic 05/19/2014  . Protein-calorie malnutrition, severe ([place]) 05/12/2014  . Hip fracture, intertrochanteric ([place]) 05/10/2014  . NICM (nonischemic cardiomyopathy) ([place]) 03/24/2014  . Dyspnea 08/03/2013  . Automatic implantable cardioverter-defibrillator in situ 04/16/2013  . CYSTOCELE WITHOUT MENTION UTERINE PROLAPSE LAT 10/12/2008  . History of breast cancer 11/05/2007  . INSOMNIA-SLEEP DISORDER-UNSPEC 07/10/2007  . HYPERCHOLESTEROLEMIA 09/20/2006  . Essential hypertension 09/20/2006  . VENTRICULAR TACHYCARDIA 09/20/2006  . Chronic diastolic heart failure ([place]) 09/20/2006  . PSORIASIS 09/20/2006  . Osteoporosis 09/20/2006    Past Surgical History:  Procedure Laterality Date  . APPENDECTOMY  1943  . BREAST BIOPSY Left  [id number]  . CARDIAC CATHETERIZATION  12/18/2002   Normal coronary arteries and normal LV function  . CARDIAC DEFIBRILLATOR PLACEMENT  10/13/2004   Medtronic Maximo  VR, model [id number], serial [id number]  . CATARACT EXTRACTION W/ INTRAOCULAR LENS  IMPLANT, BILATERAL    . FEMUR IM NAIL Right 05/10/2014   Procedure: INTRAMEDULLARY (IM) NAIL FEMORAL;  Surgeon: Marianna Payment, MD;  Location: [facility];  Service: Orthopedics;  Laterality: Right;  . FRACTURE SURGERY    . MASTECTOMY Left  [id number]  . PACEMAKER INSERTION  06/17/2010   Medtronic Protecta  VR, model #D334VRG, serial [id number]  . TONSILLECTOMY  1940  . TUBAL LIGATION      OB History    No data available       Home Medications    Prior to Admission medications   Medication Sig Start Date End Date Taking? Authorizing Provider  ADVAIR DISKUS 100-50 MCG/DOSE AEPB INHALE 1 PUFF INTO THE LUNGS 2 (TWO) TIMES DAILY. 10/08/16   Donnamae Jude, MD  albuterol [facility] HFA) 108 (90 Base) MCG/ACT inhaler Inhale 2 puffs into the lungs every 6 (six) hours as needed for wheezing or shortness of breath. 12/01/16   Ivar Drape D, PA  aspirin 81 MG chewable tablet Chew 81 mg by mouth daily.    [provider]  feeding supplement, ENSURE ENLIVE, (ENSURE ENLIVE) LIQD Take 237 mLs by mouth 2 (two) times daily between meals. 02/09/17   Raiford Noble Latif, DO  FLUoxetine (PROZAC) 10 MG capsule Take 10 mg by mouth daily. 04/28/17   [provider]  furosemide (LASIX) 20 MG tablet Take 1 tablet (20 mg total) by mouth every other day AS DIRECTED. Patient taking differently: Take 20 mg every other day by mouth.  03/28/17   Croitoru, Mihai, MD  guaiFENesin (MUCINEX) 600 MG 12 hr tablet Take 600 mg by mouth 2 (two) times daily as needed for to loosen phlegm.    [provider]  ipratropium-albuterol (DUONEB) 0.5-2.5 (3) MG/3ML SOLN Take 3 mLs by nebulization every 6 (six) hours as needed (wheezing, shortness of breath). 05/12/17   Patrecia Pour, MD  KLOR-CON M20 20 MEQ tablet TAKE 1 TABLET BY MOUTH EVERY DAY 08/24/17   Donnamae Jude, MD  metoprolol tartrate (LOPRESSOR) 25 MG tablet TAKE 0.5 TABLETS (12.5 MG TOTAL) BY MOUTH 2 (TWO) TIMES DAILY. 09/17/17   Donnamae Jude, MD  Multiple Vitamin (MULTIVITAMIN WITH MINERALS) TABS tablet Take 1 tablet by mouth daily.    [provider]  polyethylene glycol (MIRALAX / GLYCOLAX) packet Take 17 g by mouth daily as needed for mild constipation. 02/09/17   Kerney Elbe, DO    Family History Family History  Problem  Relation Age of Onset  . Heart disease Mother        Pacemaker  . Heart disease Father        No details.    . Hyperlipidemia Sister   . Heart disease Brother        "Heart Attacks"  Died age 44  . Cancer Daughter        Oral cancer and "knot" on right cheek that was cancer    Social History Social History   Tobacco Use  . Smoking status: Former Smoker    Packs/day: 0.10    Years: 44.00    Pack years: 4.40    Types: Cigarettes  . Smokeless tobacco: Never Used  Substance Use Topics  . Alcohol use: Yes    Alcohol/week: 3.0 oz    Types: 5 Glasses of wine per week  . Drug use: No     Allergies   Atorvastatin; Claritin [loratadine]; and  Crestor [rosuvastatin]   Review of Systems Review of Systems  Unable to perform ROS: Severe respiratory distress  Constitutional: Negative for fever.  Respiratory: Positive for shortness of breath and wheezing.   level 5 caveat - resp distress and poorly responsive to questions.    Physical Exam Updated Vital Signs BP (!) 104/92   Pulse 81   Temp (!) 96.4 F (35.8 C) (Rectal)   Resp 18   SpO2 98%   Physical Exam  Constitutional: She appears well-developed and well-nourished. She appears distressed.  HENT:  Mouth/Throat: Oropharynx is clear and moist.  Eyes: Conjunctivae are normal. No scleral icterus.  Neck: Neck supple. No tracheal deviation present.  Cardiovascular: Normal rate, regular rhythm, normal heart sounds and intact distal pulses.  Pulmonary/Chest: She is in respiratory distress. She has wheezes.  Abdominal: Soft. Normal appearance. She exhibits no distension. There is no tenderness.  Genitourinary:  Genitourinary Comments: No cva tenderness  Musculoskeletal: She exhibits no edema or tenderness.  Neurological: She is alert.  Skin: Skin is warm and dry. Capillary refill takes less than 2 seconds. No rash noted.  Psychiatric: She has a normal mood and affect.  Nursing note and vitals reviewed.    ED Treatments  / Results  Labs (all labs ordered are listed, but only abnormal results are displayed) Results for orders placed or performed during the hospital encounter of 09/27/2017  CBC  Result Value Ref Range   WBC 10.6 (H) 4.0 - 10.5 K/uL   RBC 4.12 3.87 - 5.11 MIL/uL   Hemoglobin 12.3 12.0 - 15.0 g/dL   HCT 39.2 36.0 - 46.0 %   MCV 95.1 78.0 - 100.0 fL   MCH 29.9 26.0 - 34.0 pg   MCHC 31.4 30.0 - 36.0 g/dL   RDW 15.9 (H) 11.5 - 15.5 %   Platelets 205 150 - 400 K/uL  Comprehensive metabolic panel  Result Value Ref Range   Sodium 139 135 - 145 mmol/L   Potassium 6.1 (H) 3.5 - 5.1 mmol/L   Chloride 103 101 - 111 mmol/L   CO2 20 (L) 22 - 32 mmol/L   Glucose, Bld 131 (H) 65 - 99 mg/dL   BUN 47 (H) 6 - 20 mg/dL   Creatinine, Ser 2.43 (H) 0.44 - 1.00 mg/dL   Calcium 9.1 8.9 - 10.3 mg/dL   Total Protein 7.7 6.5 - 8.1 g/dL   Albumin 3.8 3.5 - 5.0 g/dL   AST 198 (H) 15 - 41 U/L   ALT 135 (H) 14 - 54 U/L   Alkaline Phosphatase 80 38 - 126 U/L   Total Bilirubin 1.3 (H) 0.3 - 1.2 mg/dL   GFR calc non Af Amer 17 (L) >60 mL/min   GFR calc Af Amer 20 (L) >60 mL/min   Anion gap 16 (H) 5 - 15  Brain natriuretic peptide  Result Value Ref Range   B Natriuretic Peptide 3,354.2 (H) 0.0 - 100.0 pg/mL  I-stat troponin, ED  Result Value Ref Range   Troponin i, poc 0.04 0.00 - 0.08 ng/mL   Comment 3           Dg Chest Port 1 View  Result Date: 09/26/2017 CLINICAL DATA:  Shortness of breath, weakness since yesterday, hypertension, former smoker. EXAM: PORTABLE CHEST 1 VIEW COMPARISON:  Chest x-ray dated 05/30/2017 FINDINGS: Cardiomegaly appears grossly stable. Diffusely prominent interstitial markings are not significantly changed. No new confluent opacity to suggest a developing pneumonia. No pleural effusion or pneumothorax appreciated. Right chest wall pacemaker  leads appear stable in position. Chronic deformity of the right humeral head/neck. No acute or suspicious osseous finding. IMPRESSION: 1. No acute  findings.  No evidence of pneumonia or pulmonary edema. 2. Cardiomegaly. 3. Coarse lung markings bilaterally, stable, most likely chronic interstitial lung disease. Electronically Signed   By: Franki Cabot M.D.   On: 10/14/2017 13:07    EKG  EKG Interpretation  Date/Time:  Friday September 28 2017 12:55:01 EST Ventricular Rate:  120 PR Interval:    QRS Duration: 140 QT Interval:  383 QTC Calculation: 551 R Axis:   -51 Text Interpretation:  Atrial fibrillation Nonspecific IVCD with LAD LVH with secondary repolarization abnormality Confirmed by Lajean Saver [id number]) on 10/06/2017 1:07:29 PM       Radiology Dg Chest Port 1 View  Result Date: 10/11/2017 CLINICAL DATA:  Shortness of breath, weakness since yesterday, hypertension, former smoker. EXAM: PORTABLE CHEST 1 VIEW COMPARISON:  Chest x-ray dated 05/30/2017 FINDINGS: Cardiomegaly appears grossly stable. Diffusely prominent interstitial markings are not significantly changed. No new confluent opacity to suggest a developing pneumonia. No pleural effusion or pneumothorax appreciated. Right chest wall pacemaker leads appear stable in position. Chronic deformity of the right humeral head/neck. No acute or suspicious osseous finding. IMPRESSION: 1. No acute findings.  No evidence of pneumonia or pulmonary edema. 2. Cardiomegaly. 3. Coarse lung markings bilaterally, stable, most likely chronic interstitial lung disease. Electronically Signed   By: Franki Cabot M.D.   On: 10/06/2017 13:07    Procedures Procedures (including critical care time)  Medications Ordered in ED Medications  0.9 %  sodium chloride infusion (not administered)  albuterol (PROVENTIL,VENTOLIN) solution continuous neb (not administered)  ipratropium (ATROVENT) nebulizer solution 0.5 mg (not administered)  methylPREDNISolone sodium succinate (SOLU-MEDROL) 125 mg/2 mL injection 125 mg (not administered)     Initial Impression / Assessment and Plan / ED Course  I have  reviewed the triage vital signs and the nursing notes.  Pertinent labs & imaging results that were available during my care of the patient were reviewed by me and considered in my medical decision making (see chart for details).  Iv ns. Labs. Stat pcxr. Continuous pulse ox and cardiac monitoring. Ecg.   Patient in respiratory distress/wheezing. Continuous albuterol neb treatment.   Solumedrol iv.  Labs reviewed - pt with AKI, ?dehydration - pt notes poor po intake in the past few days.   cxr reviewed  - no pna.  Given resp distress/copd exacerbation, aki, dehydration, high K, ca glu iv, ivf - will admit to medicine.  Also w a fib - noted in chart w hx afib.  Family practice consulted for admission.   CRITICAL CARE  COPD exacerbation requiring continuous nebulized treatments, acute kidney injury with hyperkalemia.  Performed by: Mirna Mires Total critical care time: 35 minutes Critical care time was exclusive of separately billable procedures and treating other patients. Critical care was necessary to treat or prevent imminent or life-threatening deterioration. Critical care was time spent personally by me on the following activities: development of treatment plan with patient and/or surrogate as well as nursing, discussions with consultants, evaluation of patient's response to treatment, examination of patient, obtaining history from patient or surrogate, ordering and performing treatments and interventions, ordering and review of laboratory studies, ordering and review of radiographic studies, pulse oximetry and re-evaluation of patient's condition.   Final Clinical Impressions(s) / ED Diagnoses   Final diagnoses:  None    ED Discharge Orders    None  Lajean Saver, MD 10/18/2017 1416

## 2017-09-28 NOTE — Progress Notes (Signed)
Pharmacy Antibiotic Note  Brianna [name] is a 84 y.o. female admitted on 10/16/2017 with COPD exacerbation.  Pharmacy has been consulted for levofloxacin dosing.  With AKI- SCr 2.49, CrCl <59mL/min. Baseline SCr ~0.9 in late 2018.  Plan: Levofloxacin 500mg  IV x1, then 250mg  IV q48h Follow renal function, clinical progression, LOT  Height: 5\' 6"  (167.6 cm) Weight: 142 lb 13.7 oz (64.8 kg) IBW/kg (Calculated) : 59.3  Temp (24hrs), Avg:97.6 F (36.4 C), Min:96.4 F (35.8 C), Max:98.3 F (36.8 C)  Recent Labs  Lab 10/13/2017 1258 09/27/2017 1601 10/15/2017 1716  WBC 10.6*  --   --   CREATININE 2.43* 2.49*  --   LATICACIDVEN  --  6.3* 6.6*    Estimated Creatinine Clearance: 15.5 mL/min (A) (by C-G formula based on SCr of 2.49 mg/dL (H)).    Allergies  Allergen Reactions  . Atorvastatin Other (See Comments)    Reaction:  Fatigue   . Claritin [Loratadine] Other (See Comments)    Reaction:  Fatigue   . Crestor [Rosuvastatin] Other (See Comments)    Reaction:  Muscle pain     Antimicrobials this admission: Levofloxacin 3/8 >>   Dose adjustments this admission: n/a  Microbiology results: 3/8 BCx:    Thank you for allowing pharmacy to be a part of this patient's care.  Sonny Anthes D. Orient, PharmD, [place] Clinical Pharmacist [phone] 10/08/2017 9:16 PM

## 2017-09-28 NOTE — Progress Notes (Addendum)
MD Nori Riis returned page aware of critical   Resident aware pt heart rate between 140-170s, pt bolus still infusing from ED and pt to transfer with RN to ultraound of abdomen. Resident to place orders

## 2017-09-28 NOTE — Progress Notes (Signed)
FPTS Interim Progress Note  S:paged about critical lactic acid  O: BP (!) 108/92 (BP Location: Left Arm)   Pulse (!) 140   Temp 98 F (36.7 C) (Oral)   Resp 20   Ht 5\' 6"  (1.676 m)   Wt 142 lb 13.7 oz (64.8 kg)   SpO2 94%   BMI 23.06 kg/m     A/P: Plan is diurese with lasix, patient has significant CHF and fluid overload is believed to be source of perfusion problems  Sherene Sires, DO 10/17/2017, 6:29 PM PGY-1, [facility] Service pager [phone]

## 2017-09-28 NOTE — Progress Notes (Addendum)
FPTS Interim Progress Note  S:went to do PM check on patient with Dr. Juleen China. Patient was sitting up in bed eating breakfast. Stated she felt some SOB but no more than her baseline at home. States home O2 is 2L (currently on 2L in room). Patient initially able to tolerate full sentences and appeared to have no increased work of breathing. As patient continued to have position changes (turned to side to listen to lungs and was pulled up in bed with assistance from RN) patient had increased WOB and was unable to speak in full sentences. Appeared very SOB. Reports no urination since yesterday. Per RN, he had been monitoring urine output during shift and states she has had no urine output.   O: BP 103/85 (BP Location: Left Arm)   Pulse (!) 135   Temp 98.3 F (36.8 C) (Oral)   Resp (!) 22   Ht 5\' 6"  (1.676 m)   Wt 142 lb 13.7 oz (64.8 kg)   SpO2 99%   BMI 23.06 kg/m   Gen: awake and alert, sitting up in bed, [place] in place  Resp: course crackles throughout, unable to speak full sentences, increased work of breathing  Ext: no edema, non tender  A/P: SOB O2 sat of 99% on 2L O2, but increased WOB on movement. No edema in lower extremities  -will discontinue continuous albuterol and transition to duoneb scheduled q6hrs and albuterol q2hrs prn given tachycardia and exam showing likely fluid overload causing SOB and no wheezing -continuous pulse ox  -continue levaquin (3/8-) -can consider lasix if acute worsening, will need to monitor Cr given increase in Cr on admission  -will obtain 2 view chest x-ray   Decreased urine output -will obtain bladder scan given no UOP despite lasix dose  -in and out cath if retention seen on bladder scan   Caroline More, DO 09/29/2017, 10:12 PM PGY-1, [facility] Medicine Service pager [phone]

## 2017-09-28 NOTE — Progress Notes (Signed)
Lab called  Critical lactic of 6.3 Paged MD awaiting call back

## 2017-09-28 NOTE — Progress Notes (Signed)
Bladder scan done. Noted 119 ml. Notified Family practice MD on call

## 2017-09-28 NOTE — Progress Notes (Signed)
Second call to MD  Awaiting call back

## 2017-09-28 NOTE — Progress Notes (Signed)
Lab called with critical lactic of 6.6 Resident aware

## 2017-09-29 ENCOUNTER — Inpatient Hospital Stay (HOSPITAL_COMMUNITY): Payer: Medicare Other

## 2017-09-29 DIAGNOSIS — N179 Acute kidney failure, unspecified: Secondary | ICD-10-CM

## 2017-09-29 DIAGNOSIS — R1084 Generalized abdominal pain: Secondary | ICD-10-CM

## 2017-09-29 DIAGNOSIS — J441 Chronic obstructive pulmonary disease with (acute) exacerbation: Principal | ICD-10-CM

## 2017-09-29 DIAGNOSIS — E875 Hyperkalemia: Secondary | ICD-10-CM

## 2017-09-29 DIAGNOSIS — I4891 Unspecified atrial fibrillation: Secondary | ICD-10-CM

## 2017-09-29 DIAGNOSIS — J8410 Pulmonary fibrosis, unspecified: Secondary | ICD-10-CM

## 2017-09-29 LAB — GLUCOSE, CAPILLARY
Glucose-Capillary: 115 mg/dL — ABNORMAL HIGH (ref 65–99)
Glucose-Capillary: 129 mg/dL — ABNORMAL HIGH (ref 65–99)

## 2017-09-29 LAB — BLOOD GAS, ARTERIAL
ACID-BASE DEFICIT: 6.9 mmol/L — AB (ref 0.0–2.0)
Acid-base deficit: 9.5 mmol/L — ABNORMAL HIGH (ref 0.0–2.0)
BICARBONATE: 15.3 mmol/L — AB (ref 20.0–28.0)
BICARBONATE: 17.8 mmol/L — AB (ref 20.0–28.0)
Drawn by: 252031
Drawn by: 105521
FIO2: 100
O2 CONTENT: 3 L/min
O2 Saturation: 99.7 %
O2 Saturation: 97.7 %
PATIENT TEMPERATURE: 98.6
PH ART: 7.332 — AB (ref 7.350–7.450)
PO2 ART: 424 mmHg — AB (ref 83.0–108.0)
PO2 ART: 115 mmHg — AB (ref 83.0–108.0)
Patient temperature: 98.6
pCO2 arterial: 30 mmHg — ABNORMAL LOW (ref 32.0–48.0)
pCO2 arterial: 34.7 mmHg (ref 32.0–48.0)
pH, Arterial: 7.327 — ABNORMAL LOW (ref 7.350–7.450)

## 2017-09-29 LAB — BASIC METABOLIC PANEL
ANION GAP: 17 — AB (ref 5–15)
Anion gap: 16 — ABNORMAL HIGH (ref 5–15)
BUN: 52 mg/dL — ABNORMAL HIGH (ref 6–20)
BUN: 53 mg/dL — ABNORMAL HIGH (ref 6–20)
CALCIUM: 9.2 mg/dL (ref 8.9–10.3)
CO2: 15 mmol/L — ABNORMAL LOW (ref 22–32)
CO2: 17 mmol/L — ABNORMAL LOW (ref 22–32)
CREATININE: 2.64 mg/dL — AB (ref 0.44–1.00)
Calcium: 8.8 mg/dL — ABNORMAL LOW (ref 8.9–10.3)
Chloride: 104 mmol/L (ref 101–111)
Chloride: 104 mmol/L (ref 101–111)
Creatinine, Ser: 2.59 mg/dL — ABNORMAL HIGH (ref 0.44–1.00)
GFR calc non Af Amer: 15 mL/min — ABNORMAL LOW (ref >60)
GFR, EST AFRICAN AMERICAN: 18 mL/min — AB (ref >60)
GFR, EST AFRICAN AMERICAN: 18 mL/min — AB (ref >60)
GFR, EST NON AFRICAN AMERICAN: 16 mL/min — AB (ref >60)
GLUCOSE: 193 mg/dL — AB (ref 65–99)
Glucose, Bld: 198 mg/dL — ABNORMAL HIGH (ref 65–99)
POTASSIUM: 5.5 mmol/L — AB (ref 3.5–5.1)
Potassium: 5.3 mmol/L — ABNORMAL HIGH (ref 3.5–5.1)
Sodium: 136 mmol/L (ref 135–145)
Sodium: 137 mmol/L (ref 135–145)

## 2017-09-29 LAB — RESPIRATORY PANEL BY PCR
Adenovirus: NOT DETECTED
Bordetella pertussis: NOT DETECTED
CORONAVIRUS 229E-RVPPCR: NOT DETECTED
Chlamydophila pneumoniae: NOT DETECTED
Coronavirus HKU1: NOT DETECTED
Coronavirus NL63: NOT DETECTED
Coronavirus OC43: NOT DETECTED
INFLUENZA A-RVPPCR: NOT DETECTED
INFLUENZA B-RVPPCR: NOT DETECTED
METAPNEUMOVIRUS-RVPPCR: NOT DETECTED
Mycoplasma pneumoniae: NOT DETECTED
PARAINFLUENZA VIRUS 2-RVPPCR: NOT DETECTED
PARAINFLUENZA VIRUS 4-RVPPCR: NOT DETECTED
Parainfluenza Virus 1: NOT DETECTED
Parainfluenza Virus 3: NOT DETECTED
RESPIRATORY SYNCYTIAL VIRUS-RVPPCR: NOT DETECTED
Rhinovirus / Enterovirus: NOT DETECTED

## 2017-09-29 LAB — CBC
HEMATOCRIT: 38 % (ref 36.0–46.0)
Hemoglobin: 11.8 g/dL — ABNORMAL LOW (ref 12.0–15.0)
MCH: 29.2 pg (ref 26.0–34.0)
MCHC: 31.1 g/dL (ref 30.0–36.0)
MCV: 94.1 fL (ref 78.0–100.0)
PLATELETS: 181 10*3/uL (ref 150–400)
RBC: 4.04 MIL/uL (ref 3.87–5.11)
RDW: 15.9 % — ABNORMAL HIGH (ref 11.5–15.5)
WBC: 12.3 10*3/uL — ABNORMAL HIGH (ref 4.0–10.5)

## 2017-09-29 LAB — URINALYSIS, ROUTINE W REFLEX MICROSCOPIC
BILIRUBIN URINE: NEGATIVE
Glucose, UA: NEGATIVE mg/dL
Ketones, ur: NEGATIVE mg/dL
Nitrite: NEGATIVE
PROTEIN: NEGATIVE mg/dL
SPECIFIC GRAVITY, URINE: 1.014 (ref 1.005–1.030)
pH: 5 (ref 5.0–8.0)

## 2017-09-29 LAB — LACTIC ACID, PLASMA
LACTIC ACID, VENOUS: 4.5 mmol/L — AB (ref 0.5–1.9)
LACTIC ACID, VENOUS: 5.1 mmol/L — AB (ref 0.5–1.9)
Lactic Acid, Venous: 4.2 mmol/L (ref 0.5–1.9)
Lactic Acid, Venous: 4.8 mmol/L (ref 0.5–1.9)
Lactic Acid, Venous: 2.7 mmol/L (ref 0.5–1.9)

## 2017-09-29 LAB — LIPASE, BLOOD: Lipase: 43 U/L (ref 11–51)

## 2017-09-29 LAB — TROPONIN I: Troponin I: 0.05 ng/mL (ref <0.03)

## 2017-09-29 LAB — PROCALCITONIN: Procalcitonin: 1.38 ng/mL

## 2017-09-29 LAB — HEPATIC FUNCTION PANEL
ALBUMIN: 3.4 g/dL — AB (ref 3.5–5.0)
ALK PHOS: 66 U/L (ref 38–126)
ALT: 181 U/L — ABNORMAL HIGH (ref 14–54)
AST: 241 U/L — ABNORMAL HIGH (ref 15–41)
BILIRUBIN TOTAL: 1.6 mg/dL — AB (ref 0.3–1.2)
Bilirubin, Direct: 0.5 mg/dL (ref 0.1–0.5)
Indirect Bilirubin: 1.1 mg/dL — ABNORMAL HIGH (ref 0.3–0.9)
Total Protein: 6.9 g/dL (ref 6.5–8.1)

## 2017-09-29 LAB — SODIUM, URINE, RANDOM: SODIUM UR: 38 mmol/L

## 2017-09-29 LAB — CREATININE, URINE, RANDOM: Creatinine, Urine: 109.84 mg/dL

## 2017-09-29 MED ORDER — SODIUM CHLORIDE 0.9 % IV SOLN
1.0000 g | Freq: Once | INTRAVENOUS | Status: AC
  Administered 2017-09-29: 1 g via INTRAVENOUS
  Filled 2017-09-29: qty 10

## 2017-09-29 MED ORDER — LACTATED RINGERS IV SOLN
INTRAVENOUS | Status: AC
  Administered 2017-09-29: 19:00:00 via INTRAVENOUS

## 2017-09-29 MED ORDER — VANCOMYCIN HCL IN DEXTROSE 1-5 GM/200ML-% IV SOLN
1000.0000 mg | INTRAVENOUS | Status: DC
  Administered 2017-09-29: 1000 mg via INTRAVENOUS
  Filled 2017-09-29: qty 200

## 2017-09-29 MED ORDER — PIPERACILLIN-TAZOBACTAM 3.375 G IVPB
3.3750 g | Freq: Two times a day (BID) | INTRAVENOUS | Status: DC
  Administered 2017-09-29 (×2): 3.375 g via INTRAVENOUS
  Filled 2017-09-29 (×3): qty 50

## 2017-09-29 MED ORDER — LACTATED RINGERS IV BOLUS (SEPSIS)
1000.0000 mL | Freq: Once | INTRAVENOUS | Status: AC
  Administered 2017-09-29: 1000 mL via INTRAVENOUS

## 2017-09-29 MED ORDER — SODIUM CHLORIDE 0.9 % IV BOLUS (SEPSIS)
500.0000 mL | Freq: Once | INTRAVENOUS | Status: AC
  Administered 2017-09-29: 500 mL via INTRAVENOUS

## 2017-09-29 MED ORDER — SODIUM POLYSTYRENE SULFONATE PO POWD
15.0000 g | Freq: Once | ORAL | Status: DC
Start: 2017-09-29 — End: 2017-09-29
  Filled 2017-09-29: qty 15

## 2017-09-29 MED ORDER — ORAL CARE MOUTH RINSE: 15.0000 mL | Freq: Two times a day (BID) | OROMUCOSAL | Status: DC

## 2017-09-29 MED ORDER — FUROSEMIDE 10 MG/ML IJ SOLN
40.0000 mg | Freq: Once | INTRAMUSCULAR | Status: AC
  Administered 2017-09-29: 40 mg via INTRAVENOUS
  Filled 2017-09-29: qty 4

## 2017-09-29 MED ORDER — ARFORMOTEROL TARTRATE 15 MCG/2ML IN NEBU
15.0000 ug | INHALATION_SOLUTION | Freq: Two times a day (BID) | RESPIRATORY_TRACT | Status: DC
  Administered 2017-09-29 (×2): 15 ug via RESPIRATORY_TRACT
  Filled 2017-09-29 (×4): qty 2

## 2017-09-29 MED ORDER — BUDESONIDE 0.5 MG/2ML IN SUSP
0.5000 mg | Freq: Two times a day (BID) | RESPIRATORY_TRACT | Status: DC
  Administered 2017-09-29 (×2): 0.5 mg via RESPIRATORY_TRACT
  Filled 2017-09-29 (×4): qty 2

## 2017-09-29 MED ORDER — POLYETHYLENE GLYCOL 3350 17 G PO PACK
17.0000 g | PACK | Freq: Every day | ORAL | Status: DC
  Administered 2017-09-29: 17 g via ORAL
  Filled 2017-09-29: qty 1

## 2017-09-29 NOTE — Progress Notes (Signed)
Dr Jimmey Ralph in side patients room . Verbalized that"patient is able to stay on the with O2 3lnc sating 97 %.". Informed family teaching service of Dr Jimmey Ralph assessment. Pt put on 3lnc with 02 sat 96%. Asked DR Hammonds if Foley cath is really needed Md stated Pt needs foley to measure output accurately .

## 2017-09-29 NOTE — Progress Notes (Signed)
MD at bedside, stated to place transfer to step down  MD aware of lactic acid 4.5

## 2017-09-29 NOTE — Progress Notes (Signed)
Pt BP 82/59, pt lethargic, cool to touch  Paged MD awaiting call back

## 2017-09-29 NOTE — Progress Notes (Signed)
Spoke with patient's daughter Keitha Butte about mother's change in status and provided update on care at present. Discussed how aggressive of medical care patient and her family would like. Daughter confirmed DNR status. Reported that patient would disagree with "unnecessary" life prolonging measures such as a feeding tube. Did discuss temporary supportive medical measures such as pressors. Daughter believes patient would wish to have this type of medical care. Discussed that FPTS had consulted CCM and daughter agreeable to their involvement at present.   Phill Myron, D.O. 09/29/2017, 2:25 AM PGY-3, [place]

## 2017-09-29 NOTE — Progress Notes (Signed)
While making rounds found patient to be SOB , checked 02 sat 72 %. Put on nonrebreather mask and in formed Family teaching service Dr Tammi Klippel made aware.. VS taken HR still in the 130's.

## 2017-09-29 NOTE — Progress Notes (Addendum)
MD spoke to receiving unit  Second attempt to call report

## 2017-09-29 NOTE — Progress Notes (Signed)
RRT at bedside

## 2017-09-29 NOTE — Progress Notes (Signed)
Attempted to swab pt for resp panel and insert foley cath unable to perform since echo was being done on the patient. Will swab and insert foley once the procedure is done.

## 2017-09-29 NOTE — Progress Notes (Signed)
Family teaching service in patients room. Ordered transfer of patient to step down unit.)2 sat now at 99% on NRM.

## 2017-09-29 NOTE — Progress Notes (Addendum)
Family Medicine Teaching Service Daily Progress Note Intern Pager: [phone]  Patient name: Brianna [name] Medical record number: [id number] Date of birth: 02/24/1930 Age: 84 y.o. Gender: female  Primary Care Provider: Donnamae Jude, MD Consultants: CCM Code Status: FULL   Pt Overview and Major Events to Date:  Admit 3/8 Levaquin 3/8-3/9 Vanc/Zosyn 3/9-  Assessment and Plan:  Brianna [name] is a 84 y.o. female presenting with SOB x 2days and AKI . PMH is significant for HFrEF 25-30%, COPD, Paroxysmal Afib, hx of breast cancer, auto defibriliator, asthma, major depression, LBBB  SOB:  Stable.  S/p CAT in ED.  Has home O2 requirement of 2L and currently on 4L.  Weights have been consistent lately per caregiver. Dry weight is 140 lbs.  Caregiver administers meds at home, reports compliance with QOD lasix 20 mg PO and advair inhaler daily.  CCM consulted and recommend no further diuresis as she appears dry on exam.  Given IVF and foley.  In setting of persistent tachycardia, will broaden antibiotics to Vanc/Zosyn and give bolus.  -transfer to stepdown unit this AM  -ABG  -Procalcitonin -Q6 duoneb and Q2PRN albuterol   -Continue prednisone 40 mg  -metoprolol 12.5 mg BID -Continuous pulse ox -vitals per floor routine   AKI:  Creatinine 2.43 on admission with BL >0.90>1.0. Suspect AKI likely 2/2 NSAID use at home.  SCr today 2.59.  Renal US without hydronephrosis.   -monitor with daily BMP -avoid nephrotoxic meds  -Consider nephro c/s if no improvement  -gentle fluids  -UA/Ucx  RUQ pain: Transaminitis on admission.  Symptoms present x 2 days.  No gastric disturbances.  No masses, tender to palpation RUQ, neg murphy.   Potentially gallbladder vs hepatopathy from CHF backup given increased LFTs.  Negative RUQ [place].  Could consider KUB if continued pain.  -daily CMET > if no improvement in transaminitis, could consider viral hepatitis panel  -daily exams  Hyperkalemia:  6.1 in ED  with Peaked Twaves on ecg.  Calcium gluconate given. Expect improvement with albuterol nebs.   -Improved to 5.5 on last BMET  -daily BMET   Lactic Acidosis.  Resolving.  No evidence of infection.  Afebrile with mildly elevated white count to 12.3. LA on admission elevated to 6.6>2.7 this AM.  -Trend LA    Paroxysmal Afib/autodefibrilator/LBBB- not on oral anticoagulant due to fall risk at home -aspirin 81 -will discuss anticoagulation plan with patient/family   FEN/GI: regular diet  Prophylaxis: reduced renal clearance lovenox   Disposition: to step down today   Subjective:  Pt appears somewhat uncomfortable this AM with audible wheezing on exam.  She is unable to sit upright in bed and per RN is more lethargic than earlier.  She denies CP, palpitations, SOB.  Denies abdominal pain.    Objective: Temp:  [96.8 F (36 C)-98.5 F (36.9 C)] 97.7 F (36.5 C) (03/09 2059) Pulse Rate:  [77-137] 131 (03/09 1655) Resp:  [12-24] 24 (03/09 1345) BP: (82-121)/(39-87) 113/87 (03/09 2048) SpO2:  [90 %-100 %] 96 % (03/09 1958) Weight:  [138 lb 8 oz (62.8 kg)] 138 lb 8 oz (62.8 kg) (03/09 0446)  Physical Exam:  General:  pleasant 84 yo female with audible wheeze Neck: no lesions, no JVD  Cardiovascular: tachycardic, no MRG  Respiratory: no increased WOB, however with audible wheeze present  Gastrointestinal: soft, NTND, +bs  MSK: no edema bilaterally in LE  Derm: no significant lesions/rashes Neuro: grossly intact with no complaints noted Psych: oriented, alert  Laboratory: Recent Labs  Lab 09/22/2017 1258 09/29/17 0223  WBC 10.6* 12.3*  HGB 12.3 11.8*  HCT 39.2 38.0  PLT 205 181   Recent Labs  Lab 10/20/2017 1258 10/10/2017 1601 09/23/2017 2332 09/29/17 0223 09/29/17 0538  NA 139 140 137 136  --   K 6.1* 5.3* 5.3* 5.5*  --   CL 103 103 104 104  --   CO2 20* 18* 17* 15*  --   BUN 47* 46* 52* 53*  --   CREATININE 2.43* 2.49* 2.64* 2.59*  --   CALCIUM 9.1 9.5 9.2 8.8*  --    PROT 7.7  --   --   --  6.9  BILITOT 1.3*  --   --   --  1.6*  ALKPHOS 80  --   --   --  66  ALT 135*  --   --   --  181*  AST 198*  --   --   --  241*  GLUCOSE 131* 135* 198* 193*  --     Imaging/Diagnostic Tests: US Renal  Result Date: 09/29/2017 CLINICAL DATA:  Initial evaluation for acute renal injury. EXAM: RENAL / URINARY TRACT ULTRASOUND COMPLETE COMPARISON:  None. FINDINGS: Right Kidney: Length: 9.1 cm. Increased echogenicity. No mass or hydronephrosis visualized. Trace fluid noted adjacent to the upper pole the right kidney. Left Kidney: Length: 9.3 cm. Increased echogenicity. No mass or hydronephrosis visualized. Bladder: Not well visualized on this examination. IMPRESSION: 1. Increased echogenicity within the renal parenchyma bilaterally, compatible with medical renal disease. 2. No hydronephrosis. Electronically Signed   By: Jeannine Boga M.D.   On: 09/29/2017 04:22   Dg Chest Port 1 View  Result Date: 09/29/2017 CLINICAL DATA:  Shortness of breath EXAM: PORTABLE CHEST 1 VIEW COMPARISON:  10/02/2017, 05/30/2017, 04/19/2016, 02/09/2017, CT chest 02/08/2017 FINDINGS: Right-sided pacing device as before. Marked cardiomegaly. Right greater than left coarse interstitial opacity consistent with CT demonstrated pulmonary fibrosis. More patchy confluent airspace disease at the right mid to lower lung since prior radiographs. No pneumothorax. IMPRESSION: Right greater than left fibrotic lung disease. Asymmetric density in the right thorax is similar to recent priors but different when compared to more remote studies, for example 12/01/2016, suggesting edema or interstitial inflammation superimposed on chronic lung disease. Electronically Signed   By: Donavan Foil M.D.   On: 09/29/2017 03:37     Lovenia Kim, MD 09/29/2017, 9:39 PM PGY-2, [facility] Intern pager: [phone], text pages welcome

## 2017-09-29 NOTE — Significant Event (Signed)
Rapid Response Event Note  Overview:  Called to assist with patient with hypotension Time Called: 1038 Arrival Time: 1050 Event Type: Hypotension  Initial Focused Assessment:  On arrival patient lethargic but arouses easily to name - cool and pale skin - follows commands although oriented to person only.  Resps slightly using accessory muscles - mild distress.  On 3 liters nasal cannula (baseline home is 2L n/c).  Bil BS present - hx pulmonary fibrosis - has crackles but does not sound wet - no JVD - no pedal edema.  BP soft - 94/69 HR 112 afib - not new RR 24 O2 sats 96% - rectal temp taken 97.7.  Cristin RN at bedside.    Interventions:  Rectal temp for accuracy.  NS bolus started per order Dr. Reesa Chew.  She has had ABG - MD notified of results.  Antiobotics ordered per MD.  Blood sugar 119.  LA 2.7 - trending down.  New PIV inserted left wrist x 1 attempt for fluids.  Patient alert with family - BP responding to fluids.  For transfer to SDU for closer monitoring.  Handoff to [employer].    Plan of Care (if not transferred):  Event Summary: Name of Physician Notified: Dr.  at (pta RRT)    at    Outcome: Transferred (Comment)(SDU 4E)  Event End Time: 1150  Quin Hoop

## 2017-09-29 NOTE — Progress Notes (Signed)
MD aware of pt ABG result

## 2017-09-29 NOTE — Progress Notes (Addendum)
Called RRT  Charge nurse attempted to take manual pressure, unable  CBG 129  Called RT for ABG   MD aware

## 2017-09-29 NOTE — Progress Notes (Signed)
Report given to [facility]  Update provided to pt and pt family  Pt has all belongings  Pt transferred with RN, NT and family

## 2017-09-29 NOTE — Consult Note (Signed)
PULMONARY / CRITICAL CARE MEDICINE   Name: Brianna [name] MRN: [id number] DOB: 07-Aug-1929    ADMISSION DATE:  10/06/2017 CONSULTATION DATE: 09/29/17  REFERRING MD: Dr Nori Riis  CHIEF COMPLAINT: Eval for Hypoxia  HISTORY OF PRESENT ILLNESS:   87yoF with hx Afib (not on anticoag due to fall risk), CHF (EF 25-30%), HTN, Breast cancer, COPD, Chronic hypoxia (on 2L O2 24/7), Chronic pulmonary fibrosis, who presented to the ER on 3/8 c/o SOB, Cough, and Abdominal pain x 2 days. Primary team was concerned for COPD vs CHF exacerbation. She was also found to have AKI and Hyperkalemia on admission. She was started on nebs and steroids in the ER. She received 40mg  Lasix (3/8 @ 18:16) on admission, and another 40mg  Lasix IV (3/9 @ 2:10) just prior to my being consulted. Patient has had only 150cc UOP since admission. Early this morning at 2am, RN noted Pox 72% (unclear if was good waveform or not). Patient was then placed on 100% NRB and ABG performed showing NO hypoxia (7.32/30/424/15.3) but mild metabolic acidosis from her AKI. Despite this good ABG, primary team decided to give patient more lasix. PCCM was then consulted.  On my examination, patient c/o SOB and Cough (productive of clear sputum) x 1-2 days. She also c/o abdominal pain. She admits to chest "tightness" but after a nebulizer treatment says the tightness has resolved. She admits to subjective fevers at home. Patient denies Wheezing, N/V/D. Says her last BM was yesterday.   PAST MEDICAL HISTORY :  She  has a past medical history of AICD (automatic cardioverter/defibrillator) present, Anxiety, Arthritis, Cancer of left breast ([place]), CHF (congestive heart failure) ([place]), Depression, Emphysema of lung ([place]), Falls frequently, Heart murmur, HFrEF (heart failure with reduced ejection fraction) ([place]), Hyperlipidemia, Hypertension, Nonischemic cardiomyopathy ([place]), Osteopenia, Paroxysmal atrial fibrillation ([place]), and Pneumonia (~ [phone] X 3).  PAST  SURGICAL HISTORY: She  has a past surgical history that includes Appendectomy (1943); Tonsillectomy (1940); Cardiac defibrillator placement (10/13/2004); Pacemaker insertion (06/17/2010); Cardiac catheterization (12/18/2002); Femur IM nail (Right, 05/10/2014); Fracture surgery; Mastectomy (Left,  [id number]); Breast biopsy (Left,  [id number]); Cataract extraction w/ intraocular lens  implant, bilateral; and Tubal ligation.  Allergies  Allergen Reactions  . Atorvastatin Other (See Comments)    Reaction:  Fatigue   . Claritin [Loratadine] Other (See Comments)    Reaction:  Fatigue   . Crestor [Rosuvastatin] Other (See Comments)    Reaction:  Muscle pain    No current facility-administered medications on file prior to encounter.    Current Outpatient Medications on File Prior to Encounter  Medication Sig  . ADVAIR DISKUS 100-50 MCG/DOSE AEPB INHALE 1 PUFF INTO THE LUNGS 2 (TWO) TIMES [name].  Marland Kitchen albuterol (PROAIR HFA) 108 (90 Base) MCG/ACT inhaler Inhale 2 puffs into the lungs every 6 (six) hours as needed for wheezing or shortness of breath.  Marland Kitchen aspirin 81 MG chewable tablet Chew 81 mg by mouth [name].  . feeding supplement, ENSURE ENLIVE, (ENSURE ENLIVE) LIQD Take 237 mLs by mouth 2 (two) times [name] between meals. (Patient taking differently: Take 237 mLs by mouth [name]. )  . FLUoxetine (PROZAC) 10 MG capsule Take 10 mg by mouth [name].  . furosemide (LASIX) 20 MG tablet Take 1 tablet (20 mg total) by mouth every other day AS DIRECTED. (Patient taking differently: Take 20 mg every other day by mouth. )  . ipratropium-albuterol (DUONEB) 0.5-2.5 (3) MG/3ML SOLN Take 3 mLs by nebulization every 6 (six) hours as needed (wheezing, shortness of breath).  Marland Kitchen  KLOR-CON M20 20 MEQ tablet TAKE 1 TABLET BY MOUTH EVERY DAY (Patient taking differently: TAKE 1 TABLET (20 MEQ)   BY MOUTH EVERY DAY)  . metoprolol tartrate (LOPRESSOR) 25 MG tablet TAKE 0.5 TABLETS (12.5 MG TOTAL) BY MOUTH 2 (TWO) TIMES [name].  .  naproxen sodium (ALEVE) 220 MG tablet Take 440 mg by mouth 2 (two) times [name] as needed (pain).  . OXYGEN Inhale 2 L into the lungs continuous.  . polyethylene glycol (MIRALAX / GLYCOLAX) packet Take 17 g by mouth [name] as needed for mild constipation.   FAMILY HISTORY:  Her indicated that her mother is deceased. She indicated that her father is deceased. She indicated that her sister is alive. She indicated that her brother is deceased. She indicated that her maternal grandmother is deceased. She indicated that her maternal grandfather is deceased. She indicated that her paternal grandmother is deceased. She indicated that her paternal grandfather is deceased. She indicated that her daughter is alive.  SOCIAL HISTORY: She  reports that she has quit smoking. Her smoking use included cigarettes. She has a 4.40 pack-year smoking history. she has never used smokeless tobacco. She reports that she drinks about 3.0 oz of alcohol per week. She reports that she does not use drugs.  REVIEW OF SYSTEMS:   Review of Systems  Constitutional: Positive for fever.  HENT: Negative.   Eyes: Negative.   Respiratory: Positive for cough, sputum production and shortness of breath.   Cardiovascular: Positive for chest pain.  Gastrointestinal: Positive for abdominal pain. Negative for constipation, diarrhea, nausea and vomiting.  Genitourinary: Negative.   Musculoskeletal: Negative.   Skin: Negative.   Neurological: Negative.   Endo/Heme/Allergies: Negative.   Psychiatric/Behavioral: Negative.    SUBJECTIVE:  Lying in hospital bed, mildly tachypneic to 20. Patient says "Oh honey I feel dry."  VITAL SIGNS: BP 93/60 (BP Location: Left Arm)   Pulse (!) 137   Temp 98.5 F (36.9 C) (Oral)   Resp 20   Ht 5\' 6"  (1.676 m)   Wt 142 lb 13.7 oz (64.8 kg)   SpO2 94%   BMI 23.06 kg/m   INTAKE / OUTPUT: I/O last 3 completed shifts: In: 270 [P.O.:120; I.V.:40; IV Piggyback:110] Out: -   PHYSICAL  EXAMINATION: General: Elderly thin female, lying in bed, 94% on 3L O2 >> later was 97% on 2L O2, in NAD Neuro: AAOx3, moving all extremities, obeying commands  HEENT: OP clear, MM moist  Cardiovascular: Irreg Irreg with HR in 110-120's Lungs: Fine/dry crackles b/l; no wheezes or rhonchi. Good air movement b/l. RR 20 with no accessory muscle use. Abdomen: Soft, Mildly TTP diffusely, no g/r Musculoskeletal: no LE edema  Skin: SKIN TENTING; no rashes   LABS:  BMET Recent Labs  Lab 10/05/2017 1258 09/21/2017 1601 10/05/2017 2332  NA 139 140 137  K 6.1* 5.3* 5.3*  CL 103 103 104  CO2 20* 18* 17*  BUN 47* 46* 52*  CREATININE 2.43* 2.49* 2.64*  GLUCOSE 131* 135* 198*   Electrolytes Recent Labs  Lab 09/23/2017 1258 10/03/2017 1601 09/24/2017 2332  CALCIUM 9.1 9.5 9.2   CBC Recent Labs  Lab 10/06/2017 1258 09/29/17 0223  WBC 10.6* 12.3*  HGB 12.3 11.8*  HCT 39.2 38.0  PLT 205 181   Coag's No results for input(s): APTT, INR in the last 168 hours.  Sepsis Markers Recent Labs  Lab 10/10/2017 1601 10/09/2017 1716 10/03/2017 2332  LATICACIDVEN 6.3* 6.6* 5.1*   ABG Recent Labs  Lab 09/29/17 0156  PHART 7.327*  PCO2ART 30.0*  PO2ART 424*   Liver Enzymes Recent Labs  Lab 10/18/2017 1258  AST 198*  ALT 135*  ALKPHOS 80  BILITOT 1.3*  ALBUMIN 3.8   Cardiac Enzymes No results for input(s): TROPONINI, PROBNP in the last 168 hours.  Glucose No results for input(s): GLUCAP in the last 168 hours.  Imaging Dg Chest Port 1 View  Result Date: 09/24/2017 CLINICAL DATA:  Shortness of breath, weakness since yesterday, hypertension, former smoker. EXAM: PORTABLE CHEST 1 VIEW COMPARISON:  Chest x-ray dated 05/30/2017 FINDINGS: Cardiomegaly appears grossly stable. Diffusely prominent interstitial markings are not significantly changed. No new confluent opacity to suggest a developing pneumonia. No pleural effusion or pneumothorax appreciated. Right chest wall pacemaker leads appear stable  in position. Chronic deformity of the right humeral head/neck. No acute or suspicious osseous finding. IMPRESSION: 1. No acute findings.  No evidence of pneumonia or pulmonary edema. 2. Cardiomegaly. 3. Coarse lung markings bilaterally, stable, most likely chronic interstitial lung disease. Electronically Signed   By: Franki Cabot M.D.   On: 09/23/2017 13:07   US Abdomen Limited Ruq  Result Date: 09/21/2017 CLINICAL DATA:  Right upper quadrant pain EXAM: ULTRASOUND ABDOMEN LIMITED RIGHT UPPER QUADRANT COMPARISON:  CT 11/21/2010 FINDINGS: Gallbladder: No gallstones or wall thickening visualized. No sonographic Murphy sign noted by sonographer. Common bile duct: Diameter: 4.1 mm Liver: No focal lesion identified. Within normal limits in parenchymal echogenicity. Portal vein is patent on color Doppler imaging with normal direction of blood flow towards the liver. IMPRESSION: Negative right upper quadrant abdominal ultrasound Electronically Signed   By: Donavan Foil M.D.   On: 10/15/2017 22:02   STUDIES:  RUQ [place] (09/22/2017): No gallstones or wall thickening visualized. No sonographic Murphy sign noted by sonographer. Common bile duct: Diameter: 4.1 mm Liver: No focal lesion identified. Within normal limits in parenchymal echogenicity. Portal vein is patent on color Doppler imaging with normal direction of blood flow towards the liver. IMPRESSION: Negative right upper quadrant abdominal ultrasound  CULTURES: Blood cultures (3/8): no growth Sputum culture (3/9): ordered UA (3/9): ordered   ANTIBIOTICS: Levofloxacin 3/8 >>  DISCUSSION: 87yoF with hx Afib (not on anticoag due to fall risk), CHF (EF 25-30%), HTN, Breast cancer, COPD, Chronic hypoxia (on 2L O2 24/7), Chronic pulmonary fibrosis, who presented to the ER on 3/8 c/o SOB, Cough, and Abdominal pain x 2 days. Primary team was concerned for COPD vs CHF exacerbation. She was also found to have AKI and Hyperkalemia on admission. She was started on  nebs and steroids in the ER. She received 40mg  Lasix (3/8 @ 18:16) on admission    ASSESSMENT / PLAN:  PULMONARY 1. Hx COPD and Pulmonary fibrosis; Chronic Hypoxic Respiratory failure - patient is NOT acutely hypoxic at time of my exam. Pox 94% on 3LO2 and later 97% on 2L O2. She is on 2L O2 at baseline. She has dark fingernail polish on presently which is likely causing [place] to not get a good Pox waveform. Asked RN to remove patient's fingernail polish.  - On exam she has DRY/FINE crackles consistent with pulmonary fibrosis. On review of her prior Chest CT confirms the presence of significant pulmonary fibrosis - continue her chronic home O2.  - continue duonebs q6hrs; stat pulmicort and brovana nebs BID since she is on an ICS/LABA at home. Continue Prednisone 40mg  [name].  - she reports productive cough and fever at home, although she is not febrile here. Will check a respiratory viral panel.  -  my review of her CXR from 3/8 shows pulmonary fibrosis and cardiomegaly; also questionable Right basilar atelectasis vs infiltrate. Obtain sputum culture if possible. Continue Levoflox for total of 7 days.   CARDIOVASCULAR 1. Hx CHF and HTN - patient has a history of advanced CHF with EF 25-30%; agree with repeating TTE this admission - despite this advanced CHF, patient is currently SEVERELY intravascularly depleted. In other words she is dry. Please do not diurese her any further.   2. Afib: - currently in mild RVR (HR 110-120's) due to dehydration; expect should improve with gentle IVF's.  - continue metop 12.5mg  PO BID (hold for SBP <90)  RENAL 1. AKI; Hyperkalemia: - creatinine 2.64 currently, up from 2.43 on admission, up from baseline of 0.90 - also has hyperkalemia related to the AKI - please stop diuresing. Will place foley catheter, give 1L LR bolus (slowly over 4 hours) followed by LR @ 75cc/hr x 1 day, then re-eval. - will order Renal ultrasound (prelim by tech of no hydronephrosis;  followup formal report) - obtain UA, Urine Na, and Urine Cr - avoid nephrotoxic agents    GASTROINTESTINAL 1. Abdominal pain: - unclear etiology: possible constipation?  - RUQ ultrasound normal.  - If Renal ultrasound doesn't show any hydronephrosis to explain the abdominal pain, consider obtaining KUB to rule out constipation - check LFT's and Lipase.   2. Transaminitis: - hepatocellular pattern of transaminitis. Unclear if was hypotensive at home and this is shock liver? Or some other etiology. Recommend repeating LFT's this AM. If not improved would check viral hepatitis panels.   HEMATOLOGIC 1. Hx Breast Cancer  INFECTIOUS 1. Lactic acidosis: - felt due to tissue hypoperfusion from intravascular depletion; start IVF's as discussed above. Trend lactate.  - No clear evidence of infection at this time.   ENDOCRINE No active issues   NEUROLOGIC No active issues    60 minutes critical care time  Vernie Murders, MD  Pulmonary and [place] Pager: [phone]  09/29/2017, 2:53 AM

## 2017-09-29 NOTE — Progress Notes (Addendum)
FPTS Interim Progress Note  Discussed patient with patient's primary nurse. Per RN patient appears well and is satting at 97% on 3L (home 2L). Per RN, RN feels he can take care of patient on the floor and patient no longer needs step down transfer.  -Will plan to keep patient in medsurg and monitor closely. If patient acutely worsens will transfer patient to stepdown unit.    Addendum: prior to this phone call I received call from charge nurse on 3E stating that stepdown had refused patient. Before this phone call I had already spoken to RN on stepdown unit and explained that patient needed stepdown bed given use of non-rebreather and increased care required for patient.   Caroline More, DO 09/29/2017, 3:04 AM PGY-1, [place] Medicine Service pager [phone]

## 2017-09-29 NOTE — Progress Notes (Addendum)
FPTS Interim Progress Note  S: Paged by RN that patient's O2 sat was at 72% and patient was altered. Per RN patient was gasping for air. When I arrived to room patient had non-rebreather mask on and was satting in low 90s. Patient appeared by have increased work of breathing (both belly breathing and accessory muscle use now present). Patient altered, oriented to only person. Patient very confused and unable to clarify if she is having increased work of breathing. Repiratory therapist also in room during my evaluation. Spoke to CCM (Dr. Jimmy Footman) who stated someone will come see patient.   O: BP 93/60 (BP Location: Left Arm)   Pulse (!) 137   Temp 98.5 F (36.9 C) (Oral)   Resp 20   Ht 5\' 6"  (1.676 m)   Wt 142 lb 13.7 oz (64.8 kg)   SpO2 90%   BMI 23.06 kg/m   Gen: awake, oriented to person (no place or time), increased pallor  Resp: increased work of breathing, belly breathing and accessory muscle use, course crackles throughout  A/P: SOB Sats staying in 90s while on non-rebreather mask. Increase WOB noted on exam. Course crackles throughout  -will continue to use non-rebreather mask -transfer to step-down -lasix IV 40mg   -stat ABG -CXR pending -have consulted CCM, appreciate recommendations  -will attempt to contact daughter regarding how aggressive patient would like care   AMS Oriented to person only. Confused on medical condition and unable to describe symptoms. Likely 2/2 respiratory status  -stat ABG  Hypotension BP of 92/41 seen on monitor while I was in room. Tachycardic to 111 when I was in room. Unable to assess if patient is symptomatic  -CCM consulted, appreciate recommendations   Caroline More, DO  09/29/2017, 2:19 AM PGY-1, [place] Medicine Service pager [phone]

## 2017-09-29 NOTE — Progress Notes (Signed)
Pharmacy Antibiotic Note  Brianna [name] is a 84 y.o. female admitted on 09/22/2017 with sepsis.  Pharmacy has been consulted for Vancomycin / Zosyn dosing.  Plan: Vancomycin 1 gram iv Q 48 hours Zosyn 3.375 grams iv Q 12 hours Follow up cultures, renal function, progress  Height: 5\' 6"  (167.6 cm) Weight: 138 lb 8 oz (62.8 kg) IBW/kg (Calculated) : 59.3  Temp (24hrs), Avg:97.8 F (36.6 C), Min:96.4 F (35.8 C), Max:98.5 F (36.9 C)  Recent Labs  Lab 10/07/2017 1258  10/04/2017 1601 09/29/2017 1716 09/23/2017 2332 09/29/17 0223 09/29/17 0538 09/29/17 0821  WBC 10.6*  --   --   --   --  12.3*  --   --   CREATININE 2.43*  --  2.49*  --  2.64* 2.59*  --   --   LATICACIDVEN  --    < > 6.3* 6.6* 5.1* 4.2* 4.8* 4.5*   < > = values in this interval not displayed.    Estimated Creatinine Clearance: 14.9 mL/min (A) (by C-G formula based on SCr of 2.59 mg/dL (H)).    Allergies  Allergen Reactions  . Atorvastatin Other (See Comments)    Reaction:  Fatigue   . Claritin [Loratadine] Other (See Comments)    Reaction:  Fatigue   . Crestor [Rosuvastatin] Other (See Comments)    Reaction:  Muscle pain     Thank you for allowing pharmacy to be a part of this patient's care. Anette Guarneri, PharmD [phone] 09/29/2017 10:56 AM

## 2017-09-29 NOTE — Progress Notes (Signed)
Pt arrived at 4E from 3W at 42.  [place] wash completed by nurse.  Telemetry monitoring in place.  CCMD called.  Skin assessment completed x 2 nurses.  Pt noted grimacing during assessment with c/o ABD pain.  Palpation noted tender ABD x 4 quads, hypoactive BSx4.  Per family- LBM over 1 week ago.    On call paged received new orders for miralaxed.  Pt family notified.

## 2017-09-30 ENCOUNTER — Other Ambulatory Visit: Payer: Self-pay

## 2017-09-30 LAB — BASIC METABOLIC PANEL
ANION GAP: 20 — AB (ref 5–15)
BUN: 69 mg/dL — AB (ref 6–20)
CALCIUM: 8.6 mg/dL — AB (ref 8.9–10.3)
CO2: 13 mmol/L — ABNORMAL LOW (ref 22–32)
Chloride: 103 mmol/L (ref 101–111)
Creatinine, Ser: 3.2 mg/dL — ABNORMAL HIGH (ref 0.44–1.00)
GFR calc Af Amer: 14 mL/min — ABNORMAL LOW (ref >60)
GFR, EST NON AFRICAN AMERICAN: 12 mL/min — AB (ref >60)
Glucose, Bld: 103 mg/dL — ABNORMAL HIGH (ref 65–99)
POTASSIUM: 6.6 mmol/L — AB (ref 3.5–5.1)
SODIUM: 136 mmol/L (ref 135–145)

## 2017-09-30 LAB — CBC
HCT: 38.9 % (ref 36.0–46.0)
HEMOGLOBIN: 11.8 g/dL — AB (ref 12.0–15.0)
MCH: 29.4 pg (ref 26.0–34.0)
MCHC: 30.3 g/dL (ref 30.0–36.0)
MCV: 97 fL (ref 78.0–100.0)
Platelets: 133 10*3/uL — ABNORMAL LOW (ref 150–400)
RBC: 4.01 MIL/uL (ref 3.87–5.11)
RDW: 16.6 % — AB (ref 11.5–15.5)
WBC: 17.9 10*3/uL — AB (ref 4.0–10.5)

## 2017-09-30 LAB — PROCALCITONIN: PROCALCITONIN: 1.74 ng/mL

## 2017-09-30 MED ORDER — LACTATED RINGERS IV SOLN: INTRAVENOUS | Status: DC

## 2017-09-30 MED ORDER — SODIUM POLYSTYRENE SULFONATE PO POWD
15.0000 g | Freq: Once | ORAL | Status: DC
  Filled 2017-09-30: qty 15

## 2017-10-01 LAB — URINE CULTURE: Culture: <10000 — AB

## 2017-10-03 LAB — CULTURE, BLOOD (ROUTINE X 2)
CULTURE: NO GROWTH
Culture: NO GROWTH
SPECIAL REQUESTS: ADEQUATE
Special Requests: ADEQUATE

## 2017-10-04 ENCOUNTER — Other Ambulatory Visit: Payer: Self-pay | Admitting: Nurse Practitioner

## 2017-10-22 NOTE — Discharge Summary (Addendum)
Family Medicine Teaching Service Death Summary  Patient name: Brianna [name] Medical record number: [id number] Date of birth: 1929/08/19 Age: 84 y.o. Gender: female Date of Admission: 10/12/2017  Date of Death: 10/14/17 Admitting Physician: Dickie La, MD  Primary Care Provider: Donnamae Jude, MD Consultants: N/a  Indication for Hospitalization: respiratory distress secondary to CHF/COPD  Final Diagnoses/Problem List:  COPD exacerbation  CHF exacerbation Acute kidney injury RUQ pain transaminitis Hyperkalemia Lactic acidosis Paroxysmal afib COPD HFrEH 25-30%  Brief Hospital Course:  Brianna [name] was admitted for SOB x 2days.  She was treated for both a COPD and CHF exacerbation along with an AKI.  She confirmed DNR status in the ED with her daughter present and then again with Dr. Tammi Klippel during her admission.  Given delicate fluid balance between CHF and COPD she had little reserve capacity and unfortunately decompensated despite receiving all treatment apart from intubation/ACLS.  Dr. Tammi Klippel received a page that she had a critical lab value, she immediately gave nursing instructions and began to enter orders when she received a second page that Brianna [name] was bradycardic and desaturating.   Dr. Tammi Klippel immediately went to see the patient and Brianna [name] had died when Dr. Tammi Klippel arrived on the floor.  We appreciate the privilege of participating in her care.  Significant Labs and Imaging:  Recent Labs  Lab 2017/10/12 1258 09/29/17 0223 10/14/2017 0343  WBC 10.6* 12.3* 17.9*  HGB 12.3 11.8* 11.8*  HCT 39.2 38.0 38.9  PLT 205 181 133*   Recent Labs  Lab 10/12/2017 1258 2017-10-12 1601 10-12-2017 2332 09/29/17 0223 09/29/17 0538 2017-10-14 0343  NA 139 140 137 136  --  136  K 6.1* 5.3* 5.3* 5.5*  --  6.6*  CL 103 103 104 104  --  103  CO2 20* 18* 17* 15*  --  13*  GLUCOSE 131* 135* 198* 193*  --  103*  BUN 47* 46* 52* 53*  --  69*  CREATININE 2.43* 2.49* 2.64* 2.59*   --  3.20*  CALCIUM 9.1 9.5 9.2 8.8*  --  8.6*  ALKPHOS 80  --   --   --  66  --   AST 198*  --   --   --  241*  --   ALT 135*  --   --   --  181*  --   ALBUMIN 3.8  --   --   --  3.4*  --     US Renal  Result Date: 09/29/2017 CLINICAL DATA:  Initial evaluation for acute renal injury. EXAM: RENAL / URINARY TRACT ULTRASOUND COMPLETE COMPARISON:  None. FINDINGS: Right Kidney: Length: 9.1 cm. Increased echogenicity. No mass or hydronephrosis visualized. Trace fluid noted adjacent to the upper pole the right kidney. Left Kidney: Length: 9.3 cm. Increased echogenicity. No mass or hydronephrosis visualized. Bladder: Not well visualized on this examination. IMPRESSION: 1. Increased echogenicity within the renal parenchyma bilaterally, compatible with medical renal disease. 2. No hydronephrosis. Electronically Signed   By: Jeannine Boga M.D.   On: 09/29/2017 04:22   Dg Chest Port 1 View  Result Date: 09/29/2017 CLINICAL DATA:  Shortness of breath EXAM: PORTABLE CHEST 1 VIEW COMPARISON:  10/12/2017, 05/30/2017, 04/19/2016, 02/09/2017, CT chest 02/08/2017 FINDINGS: Right-sided pacing device as before. Marked cardiomegaly. Right greater than left coarse interstitial opacity consistent with CT demonstrated pulmonary fibrosis. More patchy confluent airspace disease at the right mid to lower lung since prior radiographs. No pneumothorax. IMPRESSION: Right greater than left fibrotic  lung disease. Asymmetric density in the right thorax is similar to recent priors but different when compared to more remote studies, for example 12/01/2016, suggesting edema or interstitial inflammation superimposed on chronic lung disease. Electronically Signed   By: Donavan Foil M.D.   On: 09/29/2017 03:37   Dg Chest Port 1 View  Result Date: 09/27/2017 CLINICAL DATA:  Shortness of breath, weakness since yesterday, hypertension, former smoker. EXAM: PORTABLE CHEST 1 VIEW COMPARISON:  Chest x-ray dated 05/30/2017 FINDINGS:  Cardiomegaly appears grossly stable. Diffusely prominent interstitial markings are not significantly changed. No new confluent opacity to suggest a developing pneumonia. No pleural effusion or pneumothorax appreciated. Right chest wall pacemaker leads appear stable in position. Chronic deformity of the right humeral head/neck. No acute or suspicious osseous finding. IMPRESSION: 1. No acute findings.  No evidence of pneumonia or pulmonary edema. 2. Cardiomegaly. 3. Coarse lung markings bilaterally, stable, most likely chronic interstitial lung disease. Electronically Signed   By: Franki Cabot M.D.   On: 10/03/2017 13:07   US Abdomen Limited Ruq  Result Date: 09/29/2017 CLINICAL DATA:  Right upper quadrant pain EXAM: ULTRASOUND ABDOMEN LIMITED RIGHT UPPER QUADRANT COMPARISON:  CT 11/21/2010 FINDINGS: Gallbladder: No gallstones or wall thickening visualized. No sonographic Murphy sign noted by sonographer. Common bile duct: Diameter: 4.1 mm Liver: No focal lesion identified. Within normal limits in parenchymal echogenicity. Portal vein is patent on color Doppler imaging with normal direction of blood flow towards the liver. IMPRESSION: Negative right upper quadrant abdominal ultrasound Electronically Signed   By: Donavan Foil M.D.   On: 10/15/2017 22:02     Sherene Sires, DO 2017/10/28, 6:45 AM PGY-1, [place] [facility] Discharge summary addended for clarity in final diagnosis as well as hospital course summary. [name]

## 2017-10-22 NOTE — Progress Notes (Signed)
[employer] states they do not turn off pacers on deceased pts. Pts body cleaned. Waiting on family arrival from [place].

## 2017-10-22 NOTE — Progress Notes (Signed)
FPTS Interim Progress Note  Received page of critical lab value of K 6.6. With BP of 85/69 and complaints of abdominal pain. I ordered stat EKG and Kayexalate and informed nurse to attempt to obtain manual BP to confirm BP. Received 2nd page as I was putting in orders that patient was desatting into 60s and HR pacing in the 40s and unable to obtain manual pressure. I quickly went to room and notified Dr. Reesa Chew. When I arrived at room patient had already passed. Patient was DNR, confirmed DNR status with daughter again on 09/29/17. RN notified family but stated they would be coming from out of town. Informed RN to page FPTS team when family arrives so that we may talk to patient's daughter in person.   Caroline More, DO 01-Oct-2017, 6:47 AM PGY-1, [place] Service pager [phone]

## 2017-10-22 NOTE — Plan of Care (Signed)
  Health Behavior/Discharge Planning: Ability to manage health-related needs will improve 17-Oct-2017 0051 - Not Progressing by Lajoyce Corners, RN   Pain Managment: General experience of comfort will improve 10/17/2017 0051 - Not Progressing by Lajoyce Corners, RN 2017/10/17 0049 - Progressing by Lajoyce Corners, RN

## 2017-10-22 NOTE — Progress Notes (Signed)
INTERIM PROGRESS NOTE:  Spoke with family at bedside after patient's death.  We thanked them for the chance to care for Brianna [name] and they were appreciative of the care she has had here over the years.   They expected this would be happening soon and declined offer of chaplain services. We discussed that this was a rapid decline without indication of prolonged suffering and they were comforted by that fact. They did ask for assistance coordinating with funeral home and floor secretary will coordinate that with the appropriate staff member.  Dr. Criss Rosales

## 2017-10-22 NOTE — Progress Notes (Signed)
FPTS Interim Progress Note  S: Went by to do PM check on patient with Dr. Reesa Chew. Patient reports that she is feeling well. States her breathing is improved. Denies pain.   O: BP 95/73 (BP Location: Right Arm)   Pulse (!) 118   Temp (!) 97.2 F (36.2 C) (Rectal)   Resp 14   Ht 5\' 6"  (1.676 m)   Wt 138 lb 8 oz (62.8 kg)   SpO2 99%   BMI 22.35 kg/m   Gen: awake and alert, laying at 45 deg angle in bed watching television, [place] in place, pursed lip breathing Cardio: RRR, no MRG  Resp: course crackles throughout, pursed lip breathing, able to speak full sentences   A/P: SOB Showing improvement since yesterday night. Currently satting at 99% on 3L O2 (home 2L). RVP negative.  -continue scheduled duoneb and albuterol prn -continue prednisone 40mg   -continue vanc/zosyn  -sputum culture pending   Caroline More, DO October 27, 2017, 12:44 AM PGY-1, [place] [facility] Service pager [phone]

## 2017-10-22 NOTE — Progress Notes (Signed)
FMTS on-call paged about hypotension and K+ of 6.6. While carrying out MD orders pt noted to be less responsive, agonal breathing, and HR dropping into low 40s, eventually asystole. Pt pronounced dead at 0600, 2 RNs verifying. Tammi Klippel DO called in to see pt. Daughter called and notified, will be coming in from out of town. Passed to day shift RN to notify FMTS when family has arrived. CDS notified.   0645: unable to turn off pt's pacemaker/ICD with magnets, still occasionally spiking with small waves after. RR-RN also attempted to turn off with no success. [employer] notified and will be sending a representative to turn device off. Information passed along to day shift.

## 2017-10-22 NOTE — Progress Notes (Signed)
Pt noted to have decreased urine output, only approx. 100 mLs over 8 hours. Bladder scanned pt, no urine retained. On-call provider for FMTS notified and new orders carried out. Will continue to monitor.  Jaymes Graff, RN

## 2017-10-22 DEATH — deceased

## 2017-11-18 ENCOUNTER — Other Ambulatory Visit: Payer: Self-pay | Admitting: Family Medicine

## 2018-08-30 IMAGING — CT CT ANGIO CHEST
2 of 6 series · 18 of 36 positions shown · IV contrast (ISOVUE 370)
Comparison: CTA chest dated 12/20/2016.

CLINICAL DATA: Syncopal episode, found down. Right hip pain status
post prior hip replacement. New back pain. Automatic defibrillator
in place.

EXAM:
CT ANGIOGRAPHY CHEST WITH CONTRAST
TECHNIQUE: Multidetector CT imaging of the chest was performed using the
standard protocol during bolus administration of intravenous
contrast. Multiplanar CT image reconstructions and MIPs were
obtained to evaluate the vascular anatomy.
CONTRAST:  100 cc Isovue 370

[Series 6: thins for pacs · axial · 0.64mm/px · z∈[+1294,+1538]mm · 17 of 272 slices shown]
[im 14/272  lung]
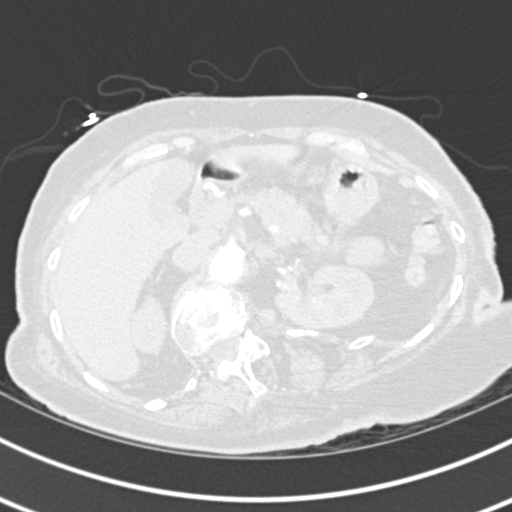
[im 28/272  mediastinal]
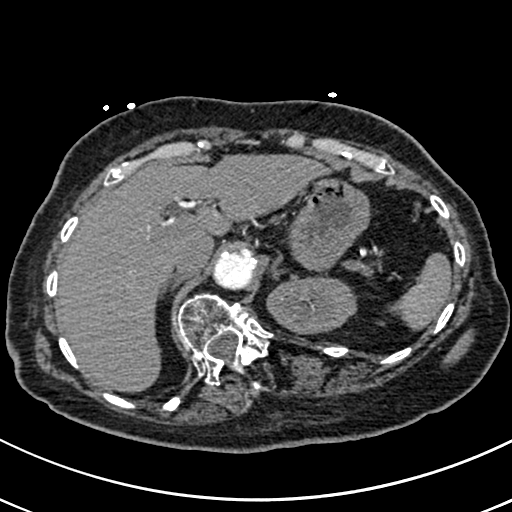
[im 41/272  lung]
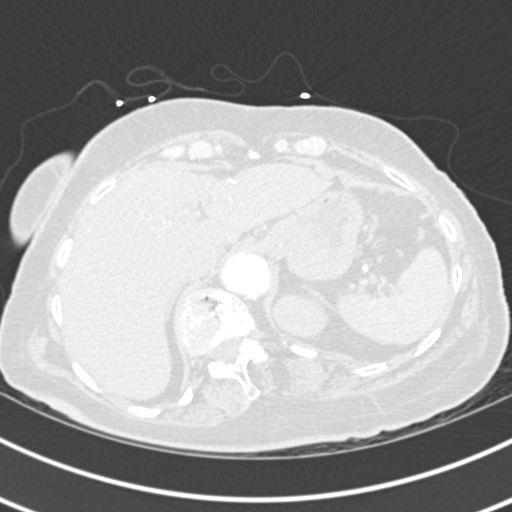
[im 55/272  mediastinal]
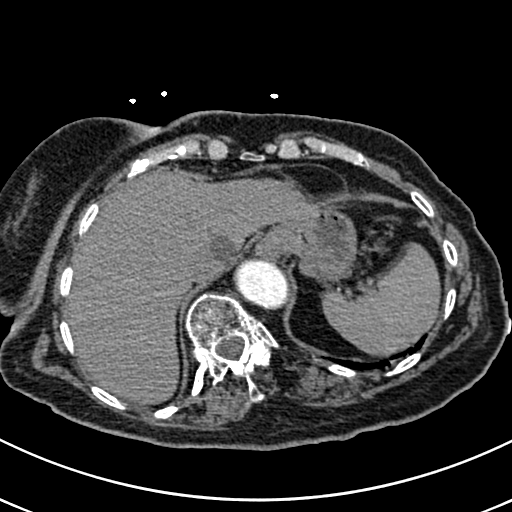
[im 82/272  lung]
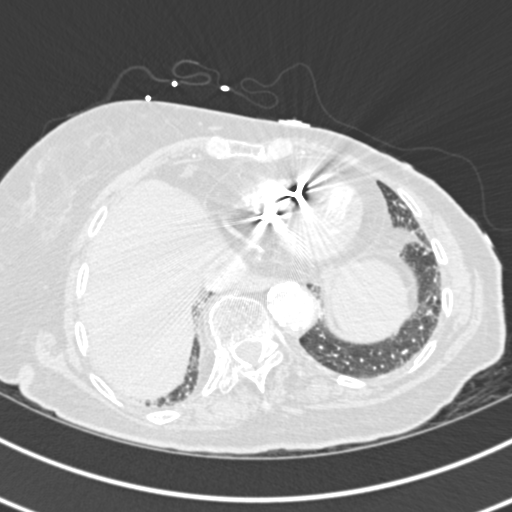
[im 95/272  mediastinal]
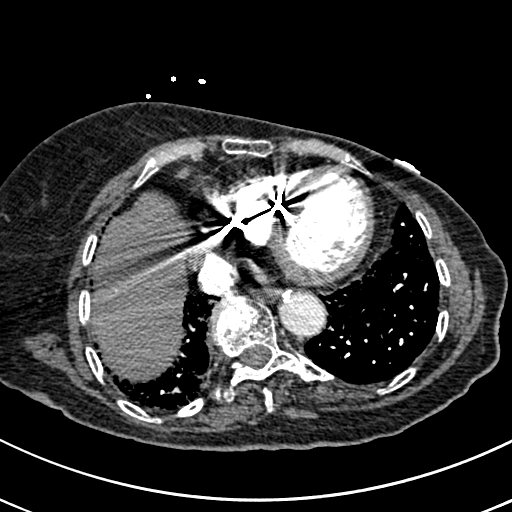
[im 109/272  lung]
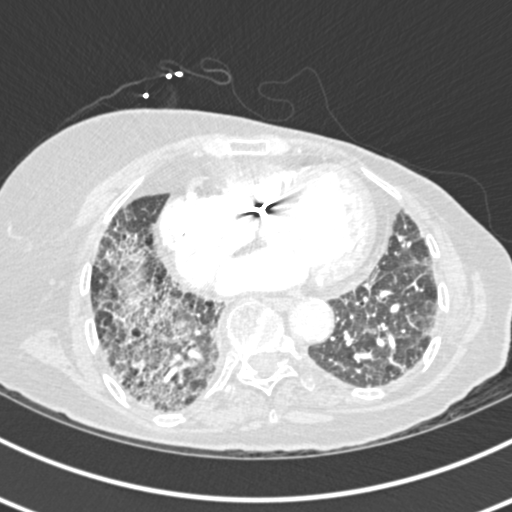
[im 122/272  mediastinal]
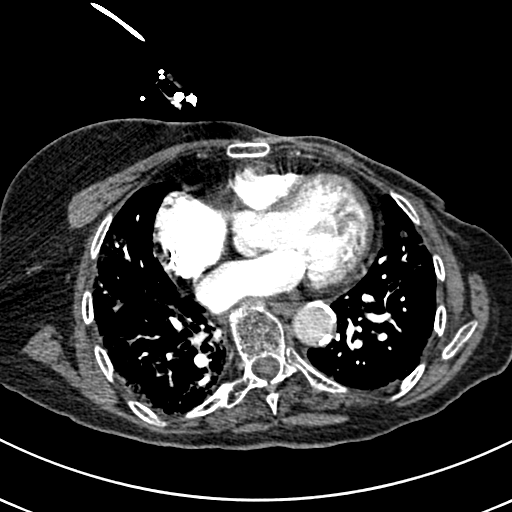
[im 136/272  lung]
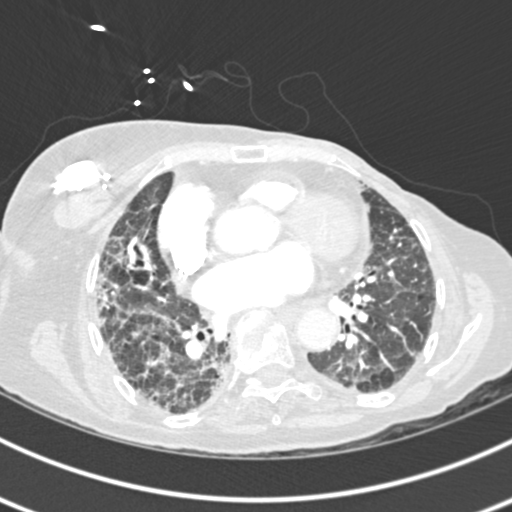
[im 150/272  mediastinal]
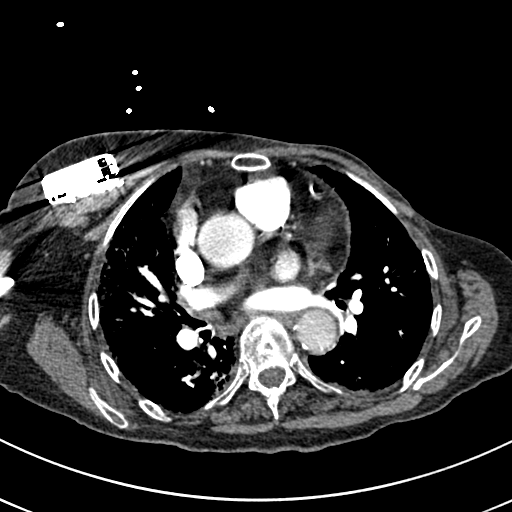
[im 163/272  lung]
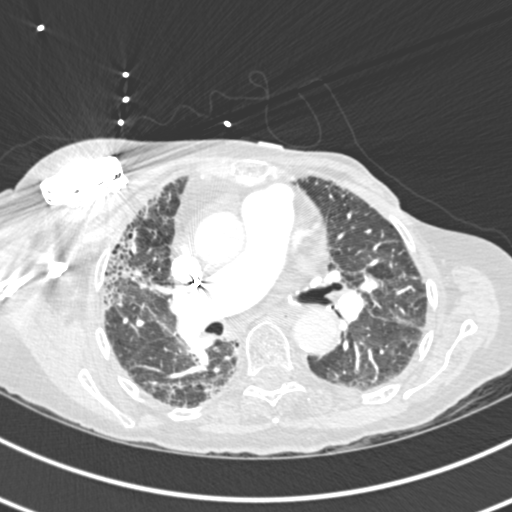
[im 177/272  mediastinal]
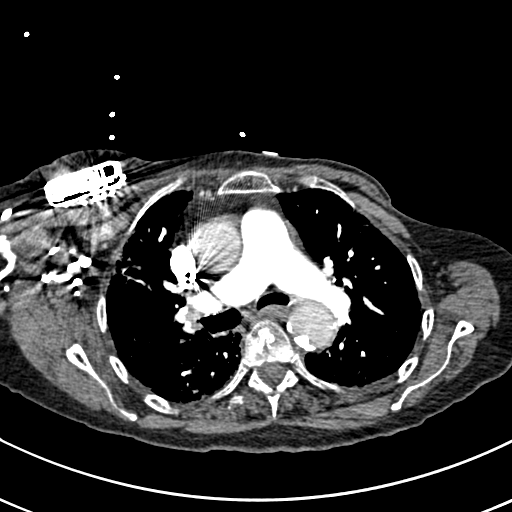
[im 190/272  lung]
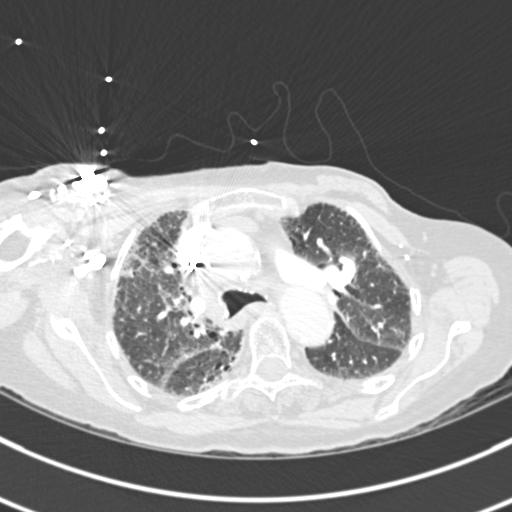
[im 217/272  mediastinal]
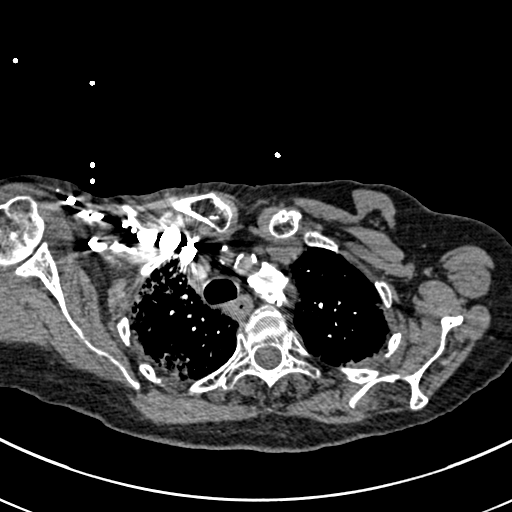
[im 231/272  lung]
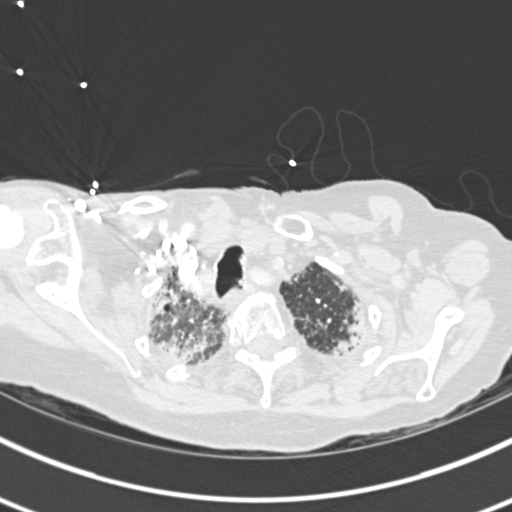
[im 244/272  mediastinal]
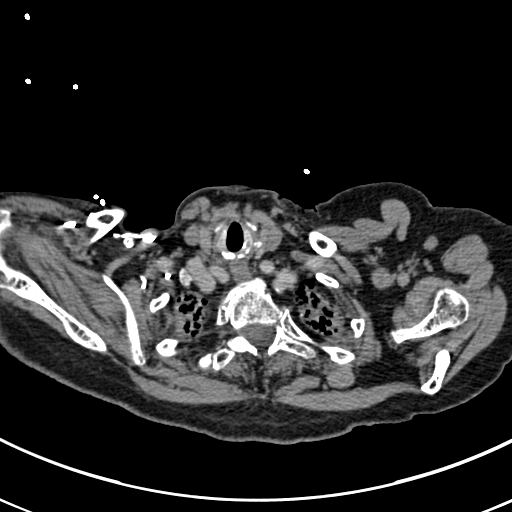
[im 258/272  lung]
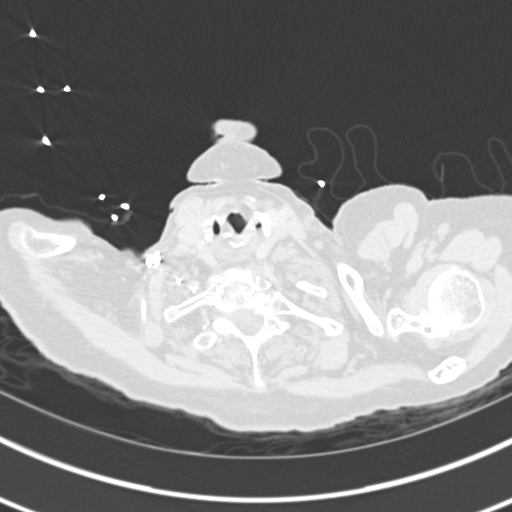

[Series 8: coronal mpr · coronal · 0.52mm/px · 1 of 100 slices shown]
[im 50/100  mediastinal]
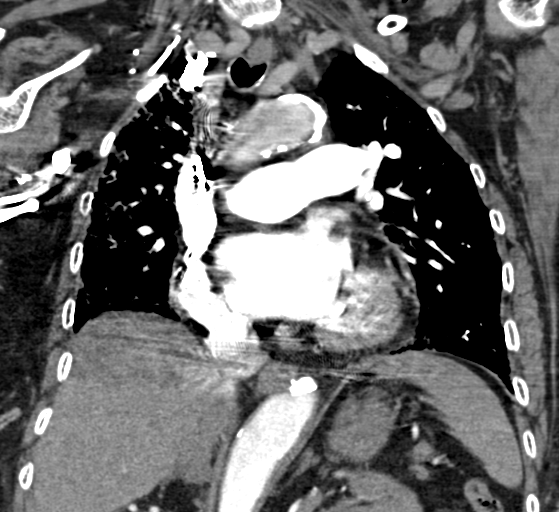

[18 of 36 positions shown; findings below may reference images not displayed]

FINDINGS: Cardiovascular: There is no pulmonary embolism identified within the
main, lobar or segmental pulmonary arteries.

No aortic aneurysm.  No dissection seen.  Aortic atherosclerosis.

Stable cardiomegaly. No pericardial effusion. Coronary artery
calcifications.

Mediastinum/Nodes: No mass or enlarged lymph nodes seen within the
mediastinum or perihilar regions. Esophagus is unremarkable. Trachea
is unremarkable.

Lungs/Pleura: Chronic interstitial fibrosis appears stable
bilaterally, right greater than left. No new lung findings. No
evidence of pneumonia. No pleural effusion or pneumothorax.

Upper Abdomen: Limited images of the upper abdomen are unremarkable.

Musculoskeletal: Stable prominent scoliotic deformity of the
thoracolumbar spine. Stable compression deformity of a midthoracic
vertebral body. Chronic deformity of the right humeral neck. No
acute appearing fracture or displacement.

Status post left mastectomy.

Review of the MIP images confirms the above findings.
IMPRESSION: 1. No acute findings. No pulmonary embolism identified. No pneumonia
or pulmonary edema.
2. Chronic interstitial fibrosis within both lungs, right greater
than left, stable compared to previous CT.
3. Aortic atherosclerosis.
4. Marked scoliosis of the thoracolumbar spine. Chronic compression
deformity of a midthoracic vertebral body. Chronic fracture
deformity of the right humeral neck. No acute appearing osseous
abnormality.

Aortic Atherosclerosis ([id number]).
